# Patient Record
Sex: Male | Born: 1970 | Race: White | Hispanic: No | Marital: Single | Smoking: Never smoker
Health system: Southern US, Community
[De-identification: ages and names within clinical notes are randomized; demographics above are authoritative.]

## PROBLEM LIST (undated history)

## (undated) DIAGNOSIS — K5790 Diverticulosis of intestine, part unspecified, without perforation or abscess without bleeding: Secondary | ICD-10-CM

## (undated) DIAGNOSIS — I10 Essential (primary) hypertension: Secondary | ICD-10-CM

## (undated) DIAGNOSIS — C801 Malignant (primary) neoplasm, unspecified: Secondary | ICD-10-CM

## (undated) DIAGNOSIS — Z5111 Encounter for antineoplastic chemotherapy: Secondary | ICD-10-CM

## (undated) HISTORY — DX: Essential (primary) hypertension: I10

## (undated) HISTORY — DX: Encounter for antineoplastic chemotherapy: Z51.11

---

## 1999-04-08 ENCOUNTER — Emergency Department (HOSPITAL_COMMUNITY): Admission: EM | Admit: 1999-04-08 | Discharge: 1999-04-08 | Payer: Self-pay | Admitting: Internal Medicine

## 1999-04-08 ENCOUNTER — Encounter: Payer: Self-pay | Admitting: Internal Medicine

## 2005-01-16 ENCOUNTER — Emergency Department (HOSPITAL_COMMUNITY): Admission: EM | Admit: 2005-01-16 | Discharge: 2005-01-17 | Payer: Self-pay | Admitting: Emergency Medicine

## 2014-09-05 ENCOUNTER — Encounter (HOSPITAL_COMMUNITY): Payer: Self-pay | Admitting: Emergency Medicine

## 2014-09-05 ENCOUNTER — Inpatient Hospital Stay (HOSPITAL_COMMUNITY)
Admission: EM | Admit: 2014-09-05 | Discharge: 2014-09-07 | DRG: 871 | Disposition: A | Discharge status: Home / Self Care | Payer: 59 | Attending: Internal Medicine | Admitting: Internal Medicine

## 2014-09-05 ENCOUNTER — Inpatient Hospital Stay (HOSPITAL_COMMUNITY): Payer: 59

## 2014-09-05 DIAGNOSIS — Z791 Long term (current) use of non-steroidal anti-inflammatories (NSAID): Secondary | ICD-10-CM | POA: Diagnosis not present

## 2014-09-05 DIAGNOSIS — F1020 Alcohol dependence, uncomplicated: Secondary | ICD-10-CM | POA: Diagnosis present

## 2014-09-05 DIAGNOSIS — D589 Hereditary hemolytic anemia, unspecified: Secondary | ICD-10-CM | POA: Diagnosis present

## 2014-09-05 DIAGNOSIS — R112 Nausea with vomiting, unspecified: Secondary | ICD-10-CM | POA: Diagnosis present

## 2014-09-05 DIAGNOSIS — R197 Diarrhea, unspecified: Secondary | ICD-10-CM | POA: Diagnosis present

## 2014-09-05 DIAGNOSIS — Z79899 Other long term (current) drug therapy: Secondary | ICD-10-CM

## 2014-09-05 DIAGNOSIS — D6959 Other secondary thrombocytopenia: Secondary | ICD-10-CM | POA: Diagnosis present

## 2014-09-05 DIAGNOSIS — R319 Hematuria, unspecified: Secondary | ICD-10-CM | POA: Diagnosis present

## 2014-09-05 DIAGNOSIS — J101 Influenza due to other identified influenza virus with other respiratory manifestations: Secondary | ICD-10-CM | POA: Diagnosis present

## 2014-09-05 DIAGNOSIS — Z7982 Long term (current) use of aspirin: Secondary | ICD-10-CM | POA: Diagnosis not present

## 2014-09-05 DIAGNOSIS — F419 Anxiety disorder, unspecified: Secondary | ICD-10-CM | POA: Diagnosis present

## 2014-09-05 DIAGNOSIS — R17 Unspecified jaundice: Secondary | ICD-10-CM | POA: Diagnosis present

## 2014-09-05 DIAGNOSIS — X58XXXA Exposure to other specified factors, initial encounter: Secondary | ICD-10-CM | POA: Diagnosis present

## 2014-09-05 DIAGNOSIS — S36119A Unspecified injury of liver, initial encounter: Secondary | ICD-10-CM | POA: Diagnosis present

## 2014-09-05 DIAGNOSIS — D594 Other nonautoimmune hemolytic anemias: Secondary | ICD-10-CM | POA: Diagnosis present

## 2014-09-05 DIAGNOSIS — N390 Urinary tract infection, site not specified: Secondary | ICD-10-CM | POA: Diagnosis present

## 2014-09-05 DIAGNOSIS — D593 Hemolytic-uremic syndrome, unspecified: Secondary | ICD-10-CM

## 2014-09-05 DIAGNOSIS — I493 Ventricular premature depolarization: Secondary | ICD-10-CM | POA: Diagnosis present

## 2014-09-05 DIAGNOSIS — R740 Nonspecific elevation of levels of transaminase and lactic acid dehydrogenase [LDH]: Secondary | ICD-10-CM

## 2014-09-05 DIAGNOSIS — R7401 Elevation of levels of liver transaminase levels: Secondary | ICD-10-CM | POA: Diagnosis present

## 2014-09-05 DIAGNOSIS — A4189 Other specified sepsis: Principal | ICD-10-CM | POA: Diagnosis present

## 2014-09-05 DIAGNOSIS — N179 Acute kidney failure, unspecified: Secondary | ICD-10-CM | POA: Diagnosis present

## 2014-09-05 DIAGNOSIS — R945 Abnormal results of liver function studies: Secondary | ICD-10-CM

## 2014-09-05 DIAGNOSIS — D649 Anemia, unspecified: Secondary | ICD-10-CM | POA: Insufficient documentation

## 2014-09-05 HISTORY — DX: Diverticulosis of intestine, part unspecified, without perforation or abscess without bleeding: K57.90

## 2014-09-05 LAB — I-STAT CHEM 8, ED
BUN: 28 mg/dL — ABNORMAL HIGH (ref 6–23)
CALCIUM ION: 1.1 mmol/L — AB (ref 1.12–1.23)
CHLORIDE: 102 mmol/L (ref 96–112)
Creatinine, Ser: 1.8 mg/dL — ABNORMAL HIGH (ref 0.50–1.35)
GLUCOSE: 128 mg/dL — AB (ref 70–99)
HCT: 13 % — ABNORMAL LOW (ref 39.0–52.0)
Hemoglobin: 4.4 g/dL — CL (ref 13.0–17.0)
Potassium: 4.2 mmol/L (ref 3.5–5.1)
SODIUM: 135 mmol/L (ref 135–145)
TCO2: 19 mmol/L (ref 0–100)

## 2014-09-05 LAB — URINE MICROSCOPIC-ADD ON

## 2014-09-05 LAB — IRON AND TIBC
IRON: 129 ug/dL (ref 42–165)
SATURATION RATIOS: 54 % (ref 20–55)
TIBC: 239 ug/dL (ref 215–435)
UIBC: 110 ug/dL — ABNORMAL LOW (ref 125–400)

## 2014-09-05 LAB — PREPARE RBC (CROSSMATCH)

## 2014-09-05 LAB — CBC WITH DIFFERENTIAL/PLATELET
Basophils Absolute: 0 10*3/uL (ref 0.0–0.1)
Basophils Relative: 0 % (ref 0–1)
EOS PCT: 0 % (ref 0–5)
Eosinophils Absolute: 0 10*3/uL (ref 0.0–0.7)
HEMATOCRIT: 13.2 % — AB (ref 39.0–52.0)
Hemoglobin: 3.9 g/dL — CL (ref 13.0–17.0)
LYMPHS PCT: 16 % (ref 12–46)
Lymphs Abs: 0.8 10*3/uL (ref 0.7–4.0)
MCH: 26.4 pg (ref 26.0–34.0)
MCHC: 29.5 g/dL — ABNORMAL LOW (ref 30.0–36.0)
MCV: 89.2 fL (ref 78.0–100.0)
MONO ABS: 0.4 10*3/uL (ref 0.1–1.0)
Monocytes Relative: 8 % (ref 3–12)
Neutro Abs: 3.9 10*3/uL (ref 1.7–7.7)
Neutrophils Relative %: 76 % (ref 43–77)
Platelets: 119 10*3/uL — ABNORMAL LOW (ref 150–400)
RBC: 1.48 MIL/uL — ABNORMAL LOW (ref 4.22–5.81)
RDW: 18.4 % — ABNORMAL HIGH (ref 11.5–15.5)
WBC: 5.1 10*3/uL (ref 4.0–10.5)

## 2014-09-05 LAB — URINALYSIS, ROUTINE W REFLEX MICROSCOPIC
GLUCOSE, UA: 100 mg/dL — AB
Ketones, ur: >80 mg/dL — AB
Nitrite: POSITIVE — AB
PH: 5 (ref 5.0–8.0)
Protein, ur: >300 mg/dL — AB
Specific Gravity, Urine: 1.022 (ref 1.005–1.030)
Urobilinogen, UA: 2 mg/dL — ABNORMAL HIGH (ref 0.0–1.0)

## 2014-09-05 LAB — COMPREHENSIVE METABOLIC PANEL
ALK PHOS: 34 U/L — AB (ref 39–117)
ALT: 45 U/L (ref 0–53)
ANION GAP: 9 (ref 5–15)
AST: 600 U/L — ABNORMAL HIGH (ref 0–37)
Albumin: 4.2 g/dL (ref 3.5–5.2)
BUN: 23 mg/dL (ref 6–23)
CO2: 21 mmol/L (ref 19–32)
Calcium: 8.7 mg/dL (ref 8.4–10.5)
Chloride: 104 mmol/L (ref 96–112)
Creatinine, Ser: 1.44 mg/dL — ABNORMAL HIGH (ref 0.50–1.35)
GFR calc Af Amer: 66 mL/min — ABNORMAL LOW (ref >90)
GFR, EST NON AFRICAN AMERICAN: 57 mL/min — AB (ref >90)
Glucose, Bld: 131 mg/dL — ABNORMAL HIGH (ref 70–99)
POTASSIUM: 4 mmol/L (ref 3.5–5.1)
Sodium: 134 mmol/L — ABNORMAL LOW (ref 135–145)
TOTAL PROTEIN: 7.3 g/dL (ref 6.0–8.3)
Total Bilirubin: 4.3 mg/dL — ABNORMAL HIGH (ref 0.3–1.2)

## 2014-09-05 LAB — MRSA PCR SCREENING: MRSA by PCR: NEGATIVE

## 2014-09-05 LAB — BILIRUBIN, FRACTIONATED(TOT/DIR/INDIR)
Bilirubin, Direct: 0.7 mg/dL — ABNORMAL HIGH (ref 0.0–0.5)
Indirect Bilirubin: 2.9 mg/dL — ABNORMAL HIGH (ref 0.3–0.9)
Total Bilirubin: 3.6 mg/dL — ABNORMAL HIGH (ref 0.3–1.2)

## 2014-09-05 LAB — RETICULOCYTES
RBC.: 1.21 MIL/uL — AB (ref 4.22–5.81)
RETIC COUNT ABSOLUTE: 87.1 10*3/uL (ref 19.0–186.0)
Retic Ct Pct: 7.2 % — ABNORMAL HIGH (ref 0.4–3.1)

## 2014-09-05 LAB — POC OCCULT BLOOD, ED: Fecal Occult Bld: NEGATIVE

## 2014-09-05 LAB — SALICYLATE LEVEL: SALICYLATE LVL: 10.1 mg/dL (ref 2.8–20.0)

## 2014-09-05 LAB — ABO/RH: ABO/RH(D): B POS

## 2014-09-05 LAB — ACETAMINOPHEN LEVEL: Acetaminophen (Tylenol), Serum: <10 ug/mL — ABNORMAL LOW (ref 10–30)

## 2014-09-05 LAB — LACTATE DEHYDROGENASE: LDH: 3088 U/L — ABNORMAL HIGH (ref 94–250)

## 2014-09-05 LAB — LIPASE, BLOOD: LIPASE: 58 U/L (ref 11–59)

## 2014-09-05 LAB — VITAMIN B12: Vitamin B-12: 370 pg/mL (ref 211–911)

## 2014-09-05 LAB — FOLATE: Folate: >20 ng/mL

## 2014-09-05 LAB — FERRITIN: Ferritin: 153 ng/mL (ref 22–322)

## 2014-09-05 MED ORDER — ADULT MULTIVITAMIN W/MINERALS CH
1.0000 | ORAL_TABLET | Freq: Every day | ORAL | Status: DC
  Administered 2014-09-05 – 2014-09-07 (×3): 1 via ORAL
  Filled 2014-09-05 (×3): qty 1

## 2014-09-05 MED ORDER — FOLIC ACID 1 MG PO TABS
1.0000 mg | ORAL_TABLET | Freq: Every day | ORAL | Status: DC
  Administered 2014-09-05 – 2014-09-07 (×3): 1 mg via ORAL
  Filled 2014-09-05 (×3): qty 1

## 2014-09-05 MED ORDER — SODIUM CHLORIDE 0.9 % IV SOLN
Freq: Once | INTRAVENOUS | Status: AC
  Administered 2014-09-05: 17:00:00 via INTRAVENOUS

## 2014-09-05 MED ORDER — VITAMIN B-1 100 MG PO TABS
100.0000 mg | ORAL_TABLET | Freq: Every day | ORAL | Status: DC
  Administered 2014-09-05 – 2014-09-07 (×3): 100 mg via ORAL
  Filled 2014-09-05 (×3): qty 1

## 2014-09-05 MED ORDER — DEXTROSE 5 % IV SOLN
1.0000 g | INTRAVENOUS | Status: DC
  Administered 2014-09-05: 1 g via INTRAVENOUS
  Filled 2014-09-05: qty 10

## 2014-09-05 MED ORDER — LORAZEPAM 1 MG PO TABS
1.0000 mg | ORAL_TABLET | Freq: Four times a day (QID) | ORAL | Status: DC | PRN
  Administered 2014-09-05 – 2014-09-07 (×4): 1 mg via ORAL
  Filled 2014-09-05 (×4): qty 1

## 2014-09-05 MED ORDER — LORAZEPAM 2 MG/ML IJ SOLN
1.0000 mg | Freq: Once | INTRAMUSCULAR | Status: AC
  Administered 2014-09-05: 1 mg via INTRAVENOUS
  Filled 2014-09-05: qty 1

## 2014-09-05 MED ORDER — ALUM & MAG HYDROXIDE-SIMETH 200-200-20 MG/5ML PO SUSP: 30.0000 mL | Freq: Four times a day (QID) | ORAL | Status: DC | PRN

## 2014-09-05 MED ORDER — THIAMINE HCL 100 MG/ML IJ SOLN: 100.0000 mg | Freq: Every day | INTRAMUSCULAR | Status: DC

## 2014-09-05 MED ORDER — ONDANSETRON HCL 4 MG/2ML IJ SOLN: 4.0000 mg | Freq: Four times a day (QID) | INTRAMUSCULAR | Status: DC | PRN

## 2014-09-05 MED ORDER — SODIUM CHLORIDE 0.9 % IJ SOLN
3.0000 mL | Freq: Two times a day (BID) | INTRAMUSCULAR | Status: DC
  Administered 2014-09-05 – 2014-09-06 (×2): 3 mL via INTRAVENOUS

## 2014-09-05 MED ORDER — IOHEXOL 300 MG/ML  SOLN
50.0000 mL | Freq: Once | INTRAMUSCULAR | Status: AC | PRN
  Administered 2014-09-05: 50 mL via ORAL

## 2014-09-05 MED ORDER — ONDANSETRON HCL 4 MG PO TABS: 4.0000 mg | ORAL_TABLET | Freq: Four times a day (QID) | ORAL | Status: DC | PRN

## 2014-09-05 MED ORDER — SODIUM CHLORIDE 0.9 % IV SOLN
INTRAVENOUS | Status: DC
Start: 2014-09-05 — End: 2014-09-07
  Administered 2014-09-05 – 2014-09-06 (×3): via INTRAVENOUS
  Administered 2014-09-06: 125 mL via INTRAVENOUS
  Administered 2014-09-07: 03:00:00 via INTRAVENOUS

## 2014-09-05 MED ORDER — ONDANSETRON HCL 4 MG/2ML IJ SOLN
4.0000 mg | Freq: Once | INTRAMUSCULAR | Status: AC
  Administered 2014-09-05: 4 mg via INTRAVENOUS
  Filled 2014-09-05: qty 2

## 2014-09-05 MED ORDER — PREDNISONE 20 MG PO TABS
80.0000 mg | ORAL_TABLET | Freq: Every day | ORAL | Status: DC
  Administered 2014-09-06 – 2014-09-07 (×2): 80 mg via ORAL
  Filled 2014-09-05 (×2): qty 4

## 2014-09-05 MED ORDER — HYDROMORPHONE HCL 1 MG/ML IJ SOLN: 1.0000 mg | INTRAMUSCULAR | Status: DC | PRN

## 2014-09-05 MED ORDER — SODIUM CHLORIDE 0.9 % IV BOLUS (SEPSIS)
1000.0000 mL | Freq: Once | INTRAVENOUS | Status: AC
  Administered 2014-09-05: 1000 mL via INTRAVENOUS

## 2014-09-05 MED ORDER — LORAZEPAM 2 MG/ML IJ SOLN: 1.0000 mg | Freq: Four times a day (QID) | INTRAMUSCULAR | Status: DC | PRN

## 2014-09-05 MED ORDER — DEXTROSE 5 % IV SOLN
1.0000 g | INTRAVENOUS | Status: DC
  Administered 2014-09-06: 1 g via INTRAVENOUS
  Filled 2014-09-05: qty 10

## 2014-09-05 NOTE — Progress Notes (Signed)
[place] [phone]   [phone]  CONSULT NOTE  REFERRING PHYSICIAN: Dr. Nira Conn RAI  REASON FOR CONSULTATION:  44 years old white male with severe anemia  HPI RAHIM ASTORGA is a 44 y.o. male with no significant past medical history except for diverticulosis as well as history of alcohol abuse. The patient has been in his general health until 2 days ago when he had flulike symptoms. He left work and start taking BC powder. It did help some and then he start taking Sudafed. He slept well yesterday. He was visited by his girlfriend earlier today and she noticed that his skin is pale and he has jaundice in his eyes. The patient also started having dark reddish urine yesterday. He presented to the emergency department today for evaluation. CBC on evaluation showed hemoglobin of 3.9 and hematocrit 13.2%. He has normal white blood count of 5.1 and low platelets count of 119,000. Comprehensive metabolic panel showed serum creatinine of 1.44, AST 600, ALT 45, total bilirubin 4.3. Urinalysis was positive for nitrate, bilirubin, ketones as well as leukocyte and did blood cells. His reticulocyte count was normal at 87.1. Acetaminophen and salicylate levels were normal. Fractionated bilirubin showed elevated indirect bilirubin to 2.9 and LDH is significantly elevated 3088. Review of the peripheral blood smear showed normocytic anemia with teardrops and few target cells but no schistocytes. The patient is currently receiving PRBCs transfusion. He is feeling fine with no specific complaints except for fatigue and jaundice. He had mild flank pain started 2 days ago. He denied having any current nausea or vomiting. He has few episodes of diarrhea 2 days ago but not today. He had fever with mild chills yesterday. The patient denied having any significant chest pain, shortness of breath, cough or hemoptysis. He has no headache or change in his vision. Stool for Hemoccult  was negative. He also denied having any significant medical history. No history of diabetes, hypertension, coronary artery disease, stroke or dyslipidemia. Family history is unremarkable for any malignancy or blood diseases. The patient is single and has no children. He is not smoker but drinks alcohol around 4 beers every day. No history of drug abuse. 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 HPI  Past Medical History  Diagnosis Date  . Diverticulosis     History reviewed. No pertinent past surgical history.  Family History  Problem Relation Age of Onset  . Alcohol abuse Brother   . Cirrhosis Brother     deceased    Social History History  Substance Use Topics  . Smoking status: Never Smoker   . Smokeless tobacco: Not on file  . Alcohol Use: No    No Known Allergies  Current Facility-Administered Medications  Medication Dose Route Frequency Provider Last Rate Last Dose  . 0.9 %  sodium chloride infusion   Intravenous Continuous Ripudeep Krystal Eaton, MD 125 mL/hr at 09/05/14 1750    . cefTRIAXone (ROCEPHIN) 1 g in dextrose 5 % 50 mL IVPB  1 g Intravenous Q24H Ripudeep Krystal Eaton, MD   Stopped at 09/05/14 1800   Current Outpatient Prescriptions  Medication Sig Dispense Refill  . ACETAMINOPHEN PO Take by mouth.    . Aspirin-Salicylamide-Caffeine (BC HEADACHE POWDER PO) Take 1 packet by mouth daily as needed (pain.).    [name] Kitchen celecoxib (CELEBREX) 100 MG capsule Take 100-200 mg by mouth daily as needed for mild pain.    . Multiple Vitamin (MULTIVITAMIN WITH MINERALS) TABS tablet Take  1 tablet by mouth daily.      Review of Systems  Constitutional: positive for chills, fatigue and fevers Eyes: negative Ears, nose, mouth, throat, and face: negative Respiratory: negative Cardiovascular: negative Gastrointestinal: positive for diarrhea and nausea Genitourinary:positive for hematuria Integument/breast: negative Hematologic/lymphatic:  negative Musculoskeletal:negative Neurological: negative Behavioral/Psych: negative Endocrine: negative Allergic/Immunologic: negative  Physical Exam  RAL: Well-developed with no status and pale appearance and icteric sclera. SKIN: no rashes or significant lesions HEAD: Normocephalic, No masses, lesions, tenderness or abnormalities EYES: normal, PERRLA, icteric sclera. EARS: External ears normal, Canals clear OROPHARYNX:no exudate, no erythema and lips, buccal mucosa, and tongue normal  NECK: supple, no adenopathy, no JVD LYMPH:  no palpable lymphadenopathy, no hepatosplenomegaly LUNGS: clear to auscultation , and palpation HEART: regular rate & rhythm, no murmurs and no gallops ABDOMEN:abdomen soft, non-tender, normal bowel sounds and no masses or organomegaly BACK: Back symmetric, no curvature., No CVA tenderness EXTREMITIES:no joint deformities, effusion, or inflammation, no edema, no skin discoloration  NEURO: alert & oriented x 3 with fluent speech, no focal motor/sensory deficits  PERFORMANCE STATUS: ECOG 1  LABORATORY DATA: Lab Results  Component Value Date   WBC 5.1 09/05/2014   HGB 4.4* 09/05/2014   HCT 13.0* 09/05/2014   MCV 89.2 09/05/2014   PLT 119* 09/05/2014    @LASTCHEM @  RADIOGRAPHIC STUDIES: No results found.  ASSESSMENT: This is a very pleasant 44 years old white male with severe anemia most likely secondary to hemolysis but other etiologies cannot be excluded at this point. The patient has no significant schistocytes on the peripheral blood smear.   PLAN: We ordered several studies today for evaluation of his anemia including CBC, comprehensive metabolic panel iron study, ferritin, haptoglobin, LDH, vitamin B 12, serum folate. He is also have a CT scan of the abdomen pending for evaluation of his abdominal pain as well as the hematuria and elevated liver enzymes. We will continue with the current supportive care with IV hydration and PRBCs  transfusion. I will wait for the remaining results before giving any further recommendation regarding management of his condition. The findings are consistent with hemolytic anemia, I would consider the patient for treatment with high-dose steroid, prednisone 1 mg/kilogram on daily basis. There is no clear finding to suggest TTP at this point but we will continue to monitor for any symptoms or signs to suggest this diagnosis. I discussed my recommendation with the patient and he is in agreement with the current plan. The patient voices understanding of current disease status and treatment options and is in agreement with the current care plan.  All questions were answered.  Thank you so much for allowing me to participate in the care of EVENS MENO. I will continue to follow up the patient with you and assist in his care.  Disclaimer: This note was dictated with voice recognition software. Similar sounding words can inadvertently be transcribed and may not be corrected upon review.   Yvan Dority K. September 05, 2014, 7:06 PM

## 2014-09-05 NOTE — ED Provider Notes (Signed)
CSN: [id number]     Arrival date & time 09/05/14  1414 History   First MD Initiated Contact with Patient 09/05/14 1506     Chief Complaint  Patient presents with  . Fever  . Diarrhea  . Emesis  . Jaundice     (Consider location/radiation/quality/duration/timing/severity/associated sxs/prior Treatment) HPI Travis Mcdaniel is a 44 year old male with past medical history of diverticulosis who presents the ER complaining of nausea, vomiting, diarrhea, fever and jaundice. Patient reports having nausea, vomiting, diarrhea 3 days, and has had difficulty keeping food or fluids down. He reports today his significant other came home and noticed that his skin and eyes were tinted yellow. Patient reports he has never had this happen to him before. Patient reports some mild associated dyspnea on exertion this morning. Patient also reports associated hematuria throughout the day today. Patient denies chest pain, dizziness, weakness, headache, blurred vision, abdominal pain, hematemesis, hematochezia, melena. Patient states he currently drinks approximately 2 beers a day with approximately 6 beers a day on the weekend. Patient states she has drank for years, and use to drink even heavier than this. Patient denies using any IV drugs. Patient states he takes multivitamins, does not take any other medication over-the-counter.  Past Medical History  Diagnosis Date  . Diverticulosis    History reviewed. No pertinent past surgical history. Family History  Problem Relation Age of Onset  . Alcohol abuse Brother   . Cirrhosis Brother     deceased   History  Substance Use Topics  . Smoking status: Never Smoker   . Smokeless tobacco: Not on file  . Alcohol Use: No    Review of Systems  Constitutional: Negative for fever.  HENT: Negative for trouble swallowing.   Eyes: Negative for visual disturbance.  Respiratory: Negative for shortness of breath.   Cardiovascular: Negative for chest pain.   Gastrointestinal: Positive for nausea, vomiting and diarrhea. Negative for abdominal pain.  Genitourinary: Positive for hematuria. Negative for dysuria.  Musculoskeletal: Negative for neck pain.  Skin: Positive for color change. Negative for rash.  Neurological: Negative for dizziness, weakness and numbness.  Psychiatric/Behavioral: Negative.       Allergies  Review of patient's allergies indicates no known allergies.  Home Medications   Prior to Admission medications   Medication Sig Start Date End Date Taking? Authorizing Provider  ACETAMINOPHEN PO Take by mouth.   Yes Historical Provider, MD  Aspirin-Salicylamide-Caffeine (BC HEADACHE POWDER PO) Take 1 packet by mouth daily as needed (pain.).   Yes Historical Provider, MD  celecoxib (CELEBREX) 100 MG capsule Take 100-200 mg by mouth daily as needed for mild pain.   Yes Historical Provider, MD  Multiple Vitamin (MULTIVITAMIN WITH MINERALS) TABS tablet Take 1 tablet by mouth daily.   Yes Historical Provider, MD   BP 151/65 mmHg  Pulse 72  Temp(Src) 98.9 F (37.2 C) (Oral)  Resp 16  Ht 5\' 6"  (1.676 m)  Wt 148 lb 2.4 oz (67.2 kg)  BMI 23.92 kg/m2  SpO2 99% Physical Exam  Constitutional: He is oriented to person, place, and time. He appears well-developed and well-nourished. He has a sickly appearance.  Jaundiced-appearing male speaking in full, clear sentences and in mild distress from anxiety.  HENT:  Head: Normocephalic and atraumatic.  Mouth/Throat: Oropharynx is clear and moist. No oropharyngeal exudate.  Eyes: Conjunctivae and EOM are normal. Pupils are equal, round, and reactive to light. Right eye exhibits no discharge. Left eye exhibits no discharge. Scleral icterus is present.  Neck: Normal  range of motion.  Cardiovascular: Regular rhythm and S2 normal.  Tachycardia present.   Murmur heard.  Systolic murmur is present with a grade of 2/6  Pulses:      Radial pulses are 2+ on the right side, and 2+ on the left  side.  Tachycardia at 106 on exam  Pulmonary/Chest: Effort normal and breath sounds normal. No accessory muscle usage. No tachypnea. No respiratory distress.  Abdominal: Soft. Normal appearance. He exhibits no shifting dullness, no distension, no pulsatile liver, no fluid wave, no ascites, no pulsatile midline mass and no mass. There is no tenderness. There is no rigidity, no guarding, no tenderness at McBurney's point and negative Murphy's sign.  Genitourinary: Rectum normal. Rectal exam shows no external hemorrhoid, no internal hemorrhoid, no fissure, no mass and no tenderness.  Musculoskeletal: Normal range of motion. He exhibits no edema or tenderness.  Neurological: He is alert and oriented to person, place, and time. He has normal strength. No cranial nerve deficit or sensory deficit. Coordination normal. GCS eye subscore is 4. GCS verbal subscore is 5. GCS motor subscore is 6.  Patient fully alert, answering questions appropriately in full, clear sentences. Cranial nerves II through XII grossly intact. Motor strength 5 out of 5 in all major muscle groups of upper and lower extremity. Distal sensation intact.  Skin: Skin is warm and dry. No rash noted. He is not diaphoretic.  Psychiatric: He has a normal mood and affect.  Nursing note and vitals reviewed.   ED Course  Procedures (including critical care time) Labs Review Labs Reviewed  CBC WITH DIFFERENTIAL/PLATELET - Abnormal; Notable for the following:    RBC 1.48 (*)    Hemoglobin 3.9 (*)    HCT 13.2 (*)    MCHC 29.5 (*)    RDW 18.4 (*)    Platelets 119 (*)    All other components within normal limits  COMPREHENSIVE METABOLIC PANEL - Abnormal; Notable for the following:    Sodium 134 (*)    Glucose, Bld 131 (*)    Creatinine, Ser 1.44 (*)    AST 600 (*)    Alkaline Phosphatase 34 (*)    Total Bilirubin 4.3 (*)    GFR calc non Af Amer 57 (*)    GFR calc Af Amer 66 (*)    All other components within normal limits   URINALYSIS, ROUTINE W REFLEX MICROSCOPIC - Abnormal; Notable for the following:    Color, Urine RED (*)    APPearance TURBID (*)    Glucose, UA 100 (*)    Hgb urine dipstick LARGE (*)    Bilirubin Urine LARGE (*)    Ketones, ur >80 (*)    Protein, ur >300 (*)    Urobilinogen, UA 2.0 (*)    Nitrite POSITIVE (*)    Leukocytes, UA LARGE (*)    All other components within normal limits  IRON AND TIBC - Abnormal; Notable for the following:    UIBC 110 (*)    All other components within normal limits  RETICULOCYTES - Abnormal; Notable for the following:    Retic Ct Pct 7.2 (*)    RBC. 1.21 (*)    All other components within normal limits  ACETAMINOPHEN LEVEL - Abnormal; Notable for the following:    Acetaminophen (Tylenol), Serum <10.0 (*)    All other components within normal limits  URINE MICROSCOPIC-ADD ON - Abnormal; Notable for the following:    Squamous Epithelial / LPF MANY (*)    Bacteria, UA  MANY (*)    Casts GRANULAR CAST (*)    All other components within normal limits  LACTATE DEHYDROGENASE - Abnormal; Notable for the following:    LDH 3088 (*)    All other components within normal limits  BILIRUBIN, FRACTIONATED(TOT/DIR/INDIR) - Abnormal; Notable for the following:    Total Bilirubin 3.6 (*)    Bilirubin, Direct 0.7 (*)    Indirect Bilirubin 2.9 (*)    All other components within normal limits  I-STAT CHEM 8, ED - Abnormal; Notable for the following:    BUN 28 (*)    Creatinine, Ser 1.80 (*)    Glucose, Bld 128 (*)    Calcium, Ion 1.10 (*)    Hemoglobin 4.4 (*)    HCT 13.0 (*)    All other components within normal limits  MRSA PCR SCREENING  URINE CULTURE  OVA AND PARASITE EXAMINATION  CLOSTRIDIUM DIFFICILE BY PCR  URINE CULTURE  LIPASE, BLOOD  SALICYLATE LEVEL  VITAMIN B12  FOLATE  FERRITIN  HAPTOGLOBIN  OCCULT BLOOD X 1 CARD TO LAB, STOOL  INFLUENZA PANEL BY PCR (TYPE A & B, H1N1)  GI PATHOGEN PANEL BY PCR, STOOL  FECAL LACTOFERRIN  CBC   COMPREHENSIVE METABOLIC PANEL  TSH  HEPATITIS PANEL, ACUTE  HEMOGLOBIN AND HEMATOCRIT, BLOOD  HEMOGLOBIN AND HEMATOCRIT, BLOOD  POC OCCULT BLOOD, ED  TYPE AND SCREEN  PREPARE RBC (CROSSMATCH)  ABO/RH    Imaging Review Ct Abdomen Pelvis Wo Contrast  09/05/2014   CLINICAL DATA:  I fatigue, nausea, vomiting, and diarrhea for 2 days. Of gross hematuria onset today. No IV contrast material due to acute renal injury.  EXAM: CT ABDOMEN AND PELVIS WITHOUT CONTRAST  TECHNIQUE: Multidetector CT imaging of the abdomen and pelvis was performed following the standard protocol without IV contrast.  COMPARISON:  01/16/2005  FINDINGS: Lung bases are clear, allowing for motion artifact.  Evaluation of solid organs and vascular structures is limited without IV contrast material. Scattered subcentimeter low-attenuation lesions in the liver are too small to characterize but likely represent small cysts. The unenhanced appearance of the gallbladder, spleen, pancreas, adrenal glands, kidneys, inferior vena cava, and retroperitoneal lymph nodes is unremarkable. Calcification of aorta without aneurysm. Mesenteric and celiac axis lymph nodes are present without pathologic enlargement, likely reactive. Stomach, small bowel, and colon are not abnormally distended. Contrast material flows through to the colon without evidence of bowel obstruction. No bowel wall thickening is appreciated. No free air or free fluid in the abdomen. Abdominal wall musculature appears intact.  Pelvis: Diverticulosis of the sigmoid colon without evidence of diverticulitis. Appendix is normal. Bladder wall is not thickened. Prostate gland is not enlarged. No free or loculated pelvic fluid collections. No pelvic mass or lymphadenopathy. Degenerative changes in the spine. No destructive bone lesions.  IMPRESSION: No acute process demonstrated in the abdomen or pelvis on noncontrast imaging. No evidence of bowel obstruction or significant bowel wall  thickening.   Electronically Signed   By: Lucienne Capers M.D.   On: 09/05/2014 19:27     EKG Interpretation   Date/Time:  Wednesday September 05 2014 15:16:21 EST Ventricular Rate:  97 PR Interval:  142 QRS Duration: 103 QT Interval:  349 QTC Calculation: 443 R Axis:   75 Text Interpretation:  Sinus rhythm No old tracing to compare Confirmed by  CAMPOS  MD, Lennette Bihari ([id number]) on 09/05/2014 4:14:32 PM      MDM   Final diagnoses:  Transaminitis  Hemolytic-uremic syndrome    Patient jaundiced,  with nausea vomiting and diarrhea. Rectal exam without frank blood. Patient does note to have hematuria subjectively, hemoglobin 3.9. We will verify this, and begin blood transfusion. Patient only complaining of mild nausea and anxiety. We'll treat symptomatically. Abdominal exam benign.  CRITICAL CARE Performed by: Carrie Mew   Total critical care time: 31  Critical care time was exclusive of separately billable procedures and treating other patients.  Critical care was necessary to treat or prevent imminent or life-threatening deterioration.  Critical care was time spent personally by me on the following activities: development of treatment plan with patient and/or surrogate as well as nursing, discussions with consultants, evaluation of patient's response to treatment, examination of patient, obtaining history from patient or surrogate, ordering and performing treatments and interventions, ordering and review of laboratory studies, ordering and review of radiographic studies, pulse oximetry and re-evaluation of patient's condition.  Likely patient having hemolytic process attributing his anemia, will consult and admitted to medicine. Dr. Tana Coast with [facility] hospitalist to admit. The patient appears reasonably stabilized for admission considering the current resources, flow, and capabilities available in the ED at this time, and I doubt any other [facility] requiring further screening and/or treatment in  the ED prior to admission.  Signed,  Dahlia Bailiff, [name] 1:23 AM  Patient seen and discussed with Dr. Jola Schmidt, MD    Dahlia Bailiff, [name] 09/06/14 Raoul, MD 09/06/14 [phone]

## 2014-09-05 NOTE — H&P (Signed)
History and Physical       Hospital Admission Note Date: 09/05/2014  Patient name: Travis Mcdaniel Medical record number: [id number] Date of birth: 05-16-1971 Age: 44 y.o. Gender: male  PCP: Patient does not have any PCP    Chief Complaint:  Fever, vomiting, jaundice, hematuria, diarrhea  HPI: Patient is a 44 year old male with history of alcohol use, diverticulosis, does not have PCP, has not seen any physician in several years presented to ED, very fatigued, having nausea, vomiting, diarrhea for last 2 days. History was obtained from the patient who reported that on Monday, 2 days ago, he felt very fatigued, myalgias, chills, congestion. He took some BC powder for the headache and theraflu packet. Next day he started having diarrhea, no hematochezia or melena. He continued to feel poorly, today started having hematuria throughout the day. He denied any chest pain, coughing and productive phlegm, hematemesis, hematochezia or melena. Patient also reports that he drinks daily up to 4-6 beers a day for last 20 years, sometimes up to 8 beers a day. He otherwise denied using any new medications or IV drugs. He reports having sick contact with people around him having flulike symptoms, none actually diagnosed with influenza. Patient was told that he looked jaundiced today and hence he decided to seek medical attention. No tick bite or rashes or travel history or any exotic food intake. ER workup showed creatinine of 1.4 initially, trended up to 1.8 AST elevated 600, ALT 45, bilirubin 4.3, severe anemia with hemoglobin of 3.9, hematocrit 13.2 the PVCs 5.1, platelets 119, MCV 89.2. UA positive for nitrite, leukocytes, WBCs 21-50, ketones positive. Fecal occult blood test negative   Review of Systems:  Constitutional: +fever, chills, diaphoresis, poor appetite and fatigue.  HEENT: Denies photophobia, eye pain, redness, hearing loss, ear pain,  sore  throat, rhinorrhea, sneezing, mouth sores, trouble swallowing, neck pain, neck stiffness and tinnitus.  + congestion Respiratory: Denies SOB, DOE, cough, chest tightness,  and wheezing.   Cardiovascular: Denies chest pain, palpitations and leg swelling.  Gastrointestinal: + nausea, vomiting, diarrhea, no abdominal pain, constipation, blood in stool and abdominal distention.  Genitourinary: Denies dysuria, urgency, frequency, + hematuria, flank pain and difficulty urinating.  Musculoskeletal: +myalgias, back pain, joint swelling, arthralgias and gait problem.  Skin: Denies pallor, rash and wound.  Neurological: + dizziness, lightheadedness, generalized weakness, no seizures, syncope, numbness and + headaches.  Hematological: Denies adenopathy. Easy bruising, personal or family bleeding history  Psychiatric/Behavioral: Denies suicidal ideation, mood changes, confusion, nervousness, sleep disturbance and agitation  Past Medical History: Past Medical History  Diagnosis Date  . Diverticulosis    History reviewed. No pertinent past surgical history.  Medications: Prior to Admission medications   Medication Sig Start Date End Date Taking? Authorizing Provider  ACETAMINOPHEN PO Take by mouth.   Yes Historical Provider, MD  Aspirin-Salicylamide-Caffeine (BC HEADACHE POWDER PO) Take 1 packet by mouth daily as needed (pain.).   Yes Historical Provider, MD  celecoxib (CELEBREX) 100 MG capsule Take 100-200 mg by mouth daily as needed for mild pain.   Yes Historical Provider, MD  Multiple Vitamin (MULTIVITAMIN WITH MINERALS) TABS tablet Take 1 tablet by mouth daily.   Yes Historical Provider, MD    Allergies:  No Known Allergies  Social History: Patient reports that he has never smoked. He does not have any smokeless tobacco history on file. He denies using any IV or recreational drugs. Patient reports he drinks alcohol daily sometimes up to 8 beers per day for last  20 years.  Family  History: Family History  Problem Relation Age of Onset  . Alcohol abuse Brother   . Cirrhosis Brother     deceased    Physical Exam: Blood pressure 130/65, pulse 97, temperature 99.2 F (37.3 C), temperature source Oral, resp. rate 15, SpO2 97 %. General: Alert, awake, oriented x3, in no acute distress, jaundiced. HEENT: normocephalic, atraumatic, icteric sclera, pink conjunctiva, pupils equal and reactive to light and accomodation, oropharynx clear Neck: supple, no masses or lymphadenopathy, no goiter, no bruits  Heart: Regular rate and rhythm, without murmurs, rubs or gallops. Lungs: Clear to auscultation bilaterally, no wheezing, rales or rhonchi. Abdomen: Soft, nontender, nondistended, positive bowel sounds, no masses. Extremities: No clubbing, cyanosis or edema with positive pedal pulses. Neuro: Grossly intact, no focal neurological deficits, strength 5/5 upper and lower extremities bilaterally Psych: alert and oriented x 3, normal mood and affect Skin: no rashes or lesions, warm and dry   LABS on Admission:  Basic Metabolic Panel:  Recent Labs Lab 09/05/14 1438 09/05/14 1540  NA 134* 135  K 4.0 4.2  CL 104 102  CO2 21  --   GLUCOSE 131* 128*  BUN 23 28*  CREATININE 1.44* 1.80*  CALCIUM 8.7  --    Liver Function Tests:  Recent Labs Lab 09/05/14 1438  AST 600*  ALT 45  ALKPHOS 34*  BILITOT 4.3*  PROT 7.3  ALBUMIN 4.2    Recent Labs Lab 09/05/14 1438  LIPASE 58   No results for input(s): AMMONIA in the last 168 hours. CBC:  Recent Labs Lab 09/05/14 1438 09/05/14 1540  WBC 5.1  --   NEUTROABS 3.9  --   HGB 3.9* 4.4*  HCT 13.2* 13.0*  MCV 89.2  --   PLT 119*  --    Cardiac Enzymes: No results for input(s): CKTOTAL, CKMB, CKMBINDEX, TROPONINI in the last 168 hours. BNP: Invalid input(s): POCBNP CBG: No results for input(s): GLUCAP in the last 168 hours.   Radiological Exams on Admission: No results found.  Assessment/Plan Principal  Problem:   Anemia: Likely acute, baseline unknown, patient has not seen any physician in the last several years, associated with transaminitis, hematuria, diarrhea, acute kidney injury, rule out hemolytic anemia or HUS - Admit to stepdown, Continue transfusion, has an order for 4 units packed RBCs transfusion, continue IV fluids  - Hemolytic workup started, MCV normal, follow anemia panel, LDH, haptoglobin, check smear, FOBT  - Discussed with hematology consult, Dr. Julien Nordmann will see patient tonight   - also check influenza panel, stool studies, stat CT abdomen and pelvis to assess for hepatosplenomegaly or any other abdominal pathology  Active Problems:   Acute kidney injury - Likely due to acute anemia, UTI - Continue aggressive IV fluid hydration, blood transfusion, treat UTI - Follow CT abdomen and pelvis results    UTI (lower urinary tract infection) - Obtain urine culture and sensitivities, placed on IV Rocephin    Transaminitis with hyperbilirubinemia: Unclear etiology, patient has history of alcohol abuse - Obtain acute hepatitis panel, hemolytic anemia panel, CT abdomen and pelvis    Hematuria - Continue blood transfusion, treat UTI, follow CT abdomen and pelvis results, may need urology consult pending CT results    Alcohol dependence - Placed on CIWA scale with Ativan, continue thiamine, folate, MVI     Diarrhea - Follow stool studies, ova and parasites, C. difficile, GI pathogen panel, fecal lactoferrin  DVT prophylaxis: SCDs  CODE STATUS: Full CODE STATUS  Family  Communication: Admission, patients condition and plan of care including tests being ordered have been discussed with the patient who indicates understanding and agree with the plan and Code Status   Further plan will depend as patient's clinical course evolves and further radiologic and laboratory data become available.   Time Spent on Admission: 1 hour  Codylee Patil M.D. [facility] 09/05/2014,  5:50 PM Pager: [phone]  If 7PM-7AM, please contact night-coverage [email or web address] Password [email or web address]

## 2014-09-05 NOTE — ED Notes (Addendum)
Pt states he thought he had the flu on Monday along with fever but did not take temp, pt took tylenol and aspirin. Pt states yesterday starting peeing blood, and people were making him aware that he was looking yellow as far as skin and eyes. Pt also c/o diarrhea and not being able to keep fluids down. Pt c/o generalized body aches. Pt does have yellowish tint to skin and eyes and states this is not normal.

## 2014-09-05 NOTE — ED Notes (Signed)
Notified campos of abnormal lab result

## 2014-09-06 DIAGNOSIS — F1020 Alcohol dependence, uncomplicated: Secondary | ICD-10-CM

## 2014-09-06 DIAGNOSIS — J101 Influenza due to other identified influenza virus with other respiratory manifestations: Secondary | ICD-10-CM | POA: Diagnosis present

## 2014-09-06 DIAGNOSIS — J09X9 Influenza due to identified novel influenza A virus with other manifestations: Secondary | ICD-10-CM

## 2014-09-06 DIAGNOSIS — D599 Acquired hemolytic anemia, unspecified: Secondary | ICD-10-CM

## 2014-09-06 DIAGNOSIS — D696 Thrombocytopenia, unspecified: Secondary | ICD-10-CM

## 2014-09-06 LAB — CBC
HEMATOCRIT: 23.7 % — AB (ref 39.0–52.0)
Hemoglobin: 7.8 g/dL — ABNORMAL LOW (ref 13.0–17.0)
MCH: 28.2 pg (ref 26.0–34.0)
MCHC: 32.9 g/dL (ref 30.0–36.0)
MCV: 85.6 fL (ref 78.0–100.0)
Platelets: 79 10*3/uL — ABNORMAL LOW (ref 150–400)
RBC: 2.77 MIL/uL — ABNORMAL LOW (ref 4.22–5.81)
RDW: 16 % — AB (ref 11.5–15.5)
WBC: 4 10*3/uL (ref 4.0–10.5)

## 2014-09-06 LAB — COMPREHENSIVE METABOLIC PANEL
ALT: 35 U/L (ref 0–53)
AST: 410 U/L — AB (ref 0–37)
Albumin: 3.7 g/dL (ref 3.5–5.2)
Alkaline Phosphatase: 32 U/L — ABNORMAL LOW (ref 39–117)
Anion gap: 6 (ref 5–15)
BUN: 17 mg/dL (ref 6–23)
CO2: 20 mmol/L (ref 19–32)
CREATININE: 1.14 mg/dL (ref 0.50–1.35)
Calcium: 8.3 mg/dL — ABNORMAL LOW (ref 8.4–10.5)
Chloride: 111 mmol/L (ref 96–112)
GFR calc Af Amer: 88 mL/min — ABNORMAL LOW (ref >90)
GFR, EST NON AFRICAN AMERICAN: 76 mL/min — AB (ref >90)
Glucose, Bld: 120 mg/dL — ABNORMAL HIGH (ref 70–99)
POTASSIUM: 3.7 mmol/L (ref 3.5–5.1)
SODIUM: 137 mmol/L (ref 135–145)
TOTAL PROTEIN: 6.7 g/dL (ref 6.0–8.3)
Total Bilirubin: 1.8 mg/dL — ABNORMAL HIGH (ref 0.3–1.2)

## 2014-09-06 LAB — DIRECT ANTIGLOBULIN TEST (NOT AT ARMC)
DAT, COMPLEMENT: NEGATIVE
DAT, IGG: NEGATIVE

## 2014-09-06 LAB — INFLUENZA PANEL BY PCR (TYPE A & B)
H1N1FLUPCR: DETECTED — AB
INFLAPCR: POSITIVE — AB
Influenza B By PCR: NEGATIVE

## 2014-09-06 LAB — DIC (DISSEMINATED INTRAVASCULAR COAGULATION) PANEL
D DIMER QUANT: 1.15 ug{FEU}/mL — AB (ref 0.00–0.48)
INR: 0.94 (ref 0.00–1.49)
PROTHROMBIN TIME: 12.6 s (ref 11.6–15.2)
Platelets: 98 10*3/uL — ABNORMAL LOW (ref 150–400)

## 2014-09-06 LAB — HEPATITIS PANEL, ACUTE
HCV AB: NEGATIVE
Hep A IgM: NONREACTIVE
Hep B C IgM: NONREACTIVE
Hepatitis B Surface Ag: NEGATIVE

## 2014-09-06 LAB — HAPTOGLOBIN: Haptoglobin: <10 mg/dL — ABNORMAL LOW (ref 34–200)

## 2014-09-06 LAB — DIC (DISSEMINATED INTRAVASCULAR COAGULATION)PANEL
Fibrinogen: 404 mg/dL (ref 204–475)
Smear Review: NONE SEEN
aPTT: 29 seconds (ref 24–37)

## 2014-09-06 LAB — TSH: TSH: 3.282 u[IU]/mL (ref 0.350–4.500)

## 2014-09-06 LAB — HEMOGLOBIN AND HEMATOCRIT, BLOOD
HCT: 24.7 % — ABNORMAL LOW (ref 39.0–52.0)
Hemoglobin: 8.1 g/dL — ABNORMAL LOW (ref 13.0–17.0)

## 2014-09-06 MED ORDER — HYDROCOD POLST-CHLORPHEN POLST 10-8 MG/5ML PO LQCR
5.0000 mL | Freq: Once | ORAL | Status: AC
Start: 2014-09-06 — End: 2014-09-06
  Administered 2014-09-06: 5 mL via ORAL
  Filled 2014-09-06: qty 5

## 2014-09-06 MED ORDER — OXYCODONE HCL 5 MG PO TABS
5.0000 mg | ORAL_TABLET | ORAL | Status: DC | PRN
  Administered 2014-09-06 – 2014-09-07 (×4): 5 mg via ORAL
  Filled 2014-09-06 (×5): qty 1

## 2014-09-06 MED ORDER — GUAIFENESIN-DM 100-10 MG/5ML PO SYRP
5.0000 mL | ORAL_SOLUTION | ORAL | Status: DC | PRN
  Administered 2014-09-06 – 2014-09-07 (×2): 5 mL via ORAL
  Filled 2014-09-06 (×4): qty 10

## 2014-09-06 MED ORDER — OSELTAMIVIR PHOSPHATE 75 MG PO CAPS
75.0000 mg | ORAL_CAPSULE | Freq: Two times a day (BID) | ORAL | Status: DC
  Administered 2014-09-06 – 2014-09-07 (×3): 75 mg via ORAL
  Filled 2014-09-06 (×4): qty 1

## 2014-09-06 NOTE — Progress Notes (Signed)
NUTRITION BRIEF NOTE  Pt identified due to Malnutrition Screening Tool  Weight: Wt Readings from Last 1 Encounters:  09/05/14 148 lb 2.4 oz (67.2 kg)   Body mass index is 23.92 kg/m(^2). Pt meets criteria for Normal body weight based on current BMI.  Current diet order is Regular. Pt reported feeling hungry upon assessment, and denied any nausea, vomiting or diarrhea. He is currently receiving thiamine, MVI and folic acid for anemia.  Pt denied any weight loss. NFPE did not reveal any subcutaneous body fat or muscle mass depletion.  No nutrition interventions warranted at this time. If any nutrition issues arise, please consult RD.  Wynona Dove, MS Dietetic Intern Pager: [phone]

## 2014-09-06 NOTE — Progress Notes (Signed)
Progress Note   Travis Mcdaniel IZT:[id number] DOB: 1970-07-25 DOA: 09/05/2014 PCP: No primary care provider on file.   Brief Narrative:   Travis Mcdaniel is an 44 y.o. male with a PMH of alcohol abuse and diverticulosis who was admitted on 09/05/14 with a chief complaint of a 2 day history of fatigue, nausea/vomiting and diarrhea/myalgias. On the day of admission, he also reported having hematuria. ER workup showed creatinine of 1.4 initially, trended up to 1.8 AST elevated 600, ALT 45, bilirubin 4.3, severe anemia with hemoglobin of 3.9, hematocrit 13.2, WBC 5.1, platelets 119, MCV 89.2. UA positive for nitrite, leukocytes, WBCs 21-50, ketones positive. Fecal occult blood test negative. Hematology consultation was requested to evaluate for hemolytic anemia.  Assessment/Plan:   Principal Problem:  Hemolytic anemia - Associated with transaminitis, hematuria, diarrhea, acute kidney injury. - Status post 4 units of packed RBCs. Posttransfusion hemoglobin 8.1. - Hemolytic workup shows LDH elevated at 3088, total bili 3.6, haptoglobin < 10, . - Dr. Julien Nordmann following. - CT scan of abdomen/pelvis done 09/05/14, no acute intra-abdominal abnormalities. Spleen appeared normal. - DIC panel requested.  No schistocytes on smear. No coagulopathy. - Continue prednisone with gradual taper after improvement.  Active Problems:   Sepsis secondary to influenza A/H1N1 and UTI - Start Tamiflu.  On Rocephin for UTI.   Acute kidney injury - Continue aggressive IV fluid hydration, blood transfusion, treat UTI. - No hydronephrosis noted on CT scan.   UTI (lower urinary tract infection) - Continue Rocephin. Follow-up cultures. - Granular casts with many bacteria and WBCs noted on urinalysis.   Transaminitis with hyperbilirubinemia - Acute liver injury of unclear etiology, but patient has history of alcohol abuse and now has hemolytic anemia/sepsis. - Acute hepatitis panel negative. CT of the  abdomen unremarkable.   Hematuria - Continue blood transfusion, treat UTI.   Alcohol dependence - Placed on CIWA scale with Ativan, continue thiamine, folate, MVI.   Diarrhea - Follow stool studies, ova and parasites, C. difficile, GI pathogen panel, fecal lactoferrin.     DVT Prophylaxis - SCDs ordered.  Code Status: Full. Family Communication: No family at the bedside. Disposition Plan: Home when stable.   IV Access:    Peripheral IV   Procedures and diagnostic studies:   Ct Abdomen Pelvis Wo Contrast 09/05/2014: No acute process demonstrated in the abdomen or pelvis on noncontrast imaging. No evidence of bowel obstruction or significant bowel wall thickening.     Medical Consultants:    Dr. Curt Bears, Hematology.  Anti-Infectives:    Rocephin 09/05/14--->  Tamiflu 09/06/14--->  Subjective:   Travis Mcdaniel complains of occasional diarrhea but no nausea or vomiting and wants his diet advanced. Has significant myalgias as is requesting pain medication. Also reports cough. No dyspnea.  Objective:    Filed Vitals:   09/06/14 0800 09/06/14 0950 09/06/14 1000 09/06/14 1200  BP: 144/70 137/74 134/83 133/80  Pulse: 77 74 86   Temp: 98.5 F (36.9 C)   98.5 F (36.9 C)  TempSrc: Oral   Oral  Resp: 24 18 21    Height:      Weight:      SpO2: 99% 99% 99%     Intake/Output Summary (Last 24 hours) at 09/06/14 1433 Last data filed at 09/06/14 1400  Gross per 24 hour  Intake 4173.83 ml  Output   2300 ml  Net 1873.83 ml    Exam: Gen:  NAD, jaundiced Cardiovascular:  RRR, No M/R/G Respiratory:  Lungs  CTAB Gastrointestinal:  Abdomen soft, NT/ND, + BS Extremities:  No C/E/C   Data Reviewed:    Labs: Basic Metabolic Panel:  Recent Labs Lab 09/05/14 1438 09/05/14 1540 09/06/14 0655  NA 134* 135 137  K 4.0 4.2 3.7  CL 104 102 111  CO2 21  --  20  GLUCOSE 131* 128* 120*  BUN 23 28* 17  CREATININE 1.44* 1.80* 1.14  CALCIUM 8.7  --   8.3*   GFR Estimated Creatinine Clearance: 73.1 mL/min (by C-G formula based on Cr of 1.14). Liver Function Tests:  Recent Labs Lab 09/05/14 1438 09/05/14 1702 09/06/14 0655  AST 600*  --  410*  ALT 45  --  35  ALKPHOS 34*  --  32*  BILITOT 4.3* 3.6* 1.8*  PROT 7.3  --  6.7  ALBUMIN 4.2  --  3.7    Recent Labs Lab 09/05/14 1438  LIPASE 58   Coagulation profile  Recent Labs Lab 09/06/14 0916  INR 0.94    CBC:  Recent Labs Lab 09/05/14 1438 09/05/14 1540 09/06/14 0655 09/06/14 0916 09/06/14 1307  WBC 5.1  --  4.0  --   --   NEUTROABS 3.9  --   --   --   --   HGB 3.9* 4.4* 7.8*  --  8.1*  HCT 13.2* 13.0* 23.7*  --  24.7*  MCV 89.2  --  85.6  --   --   PLT 119*  --  79* 98*  --    D-Dimer:  Recent Labs  09/06/14 0916  DDIMER 1.15*   Thyroid function studies:  Recent Labs  09/06/14 0655  TSH 3.282   Anemia work up:  Recent Labs  09/05/14 1549  VITAMINB12 370  FOLATE >20.0  FERRITIN 153  TIBC 239  IRON 129  RETICCTPCT 7.2*    Microbiology Recent Results (from the past 240 hour(s))  MRSA PCR Screening     Status: None   Collection Time: 09/05/14  7:59 PM  Result Value Ref Range Status   MRSA by PCR NEGATIVE NEGATIVE Final    Comment:        The GeneXpert MRSA Assay (FDA approved for NASAL specimens only), is one component of a comprehensive MRSA colonization surveillance program. It is not intended to diagnose MRSA infection nor to guide or monitor treatment for MRSA infections. Performed at [facility]      Medications:   . cefTRIAXone (ROCEPHIN)  IV  1 g Intravenous Q24H  . folic acid  1 mg Oral Daily  . multivitamin with minerals  1 tablet Oral Daily  . oseltamivir  75 mg Oral BID  . predniSONE  80 mg Oral Q breakfast  . sodium chloride  3 mL Intravenous Q12H  . thiamine  100 mg Oral Daily   Or  . thiamine  100 mg Intravenous Daily   Continuous Infusions: . sodium chloride 125 mL/hr at 09/06/14 1103     Time spent: 35 minutes.  The patient is medically complex and requires high complexity decision making.    LOS: 1 day   RAMA,CHRISTINA  [facility] Pager [phone]. If unable to reach me by pager, please call my cell phone at [phone].  *Please refer to [email or web address], password [email or web address] to get updated schedule on who will round on this patient, as hospitalists switch teams weekly. If 7PM-7AM, please contact night-coverage at [email or web address], password [email or web address] for any overnight needs.  09/06/2014, 2:33 PM

## 2014-09-06 NOTE — Care Management Note (Signed)
CARE MANAGEMENT NOTE 09/06/2014  Patient:  Travis Mcdaniel, Travis Mcdaniel   Account Number:  [id number]  Date Initiated:  09/06/2014  Documentation initiated by:  Shellye Zandi  Subjective/Objective Assessment:   patient admitted with hgb of 5.2, etoh abuse, reports hematuria and jaundice     Action/Plan:   home when stable   Anticipated DC Date:  09/09/2014   Anticipated DC Plan:  HOME/SELF CARE  In-house referral  NA      DC Planning Services  CM consult      Choice offered to / List presented to:             Status of service:  In process, will continue to follow Medicare Important Message given?   (If response is "NO", the following Medicare IM given date fields will be blank) Date Medicare IM given:   Medicare IM given by:   Date Additional Medicare IM given:   Additional Medicare IM given by:    Discharge Disposition:    Per UR Regulation:  Reviewed for med. necessity/level of care/duration of stay  If discussed at [place] of Stay Meetings, dates discussed:    Comments:  September 06, 2014/Sharlyne Koeneman L. Rosana Hoes, RN, BSN, CCM. Case Management [place] [phone] No discharge needs present of time of review.

## 2014-09-06 NOTE — Progress Notes (Signed)
Subjective: The patient is seen and examined today. He is feeling much better after receiving PRBCs transfusion last night. The color of his urine has improved. He continues to have few episodes of diarrhea. He denied having any significant nausea or vomiting. No current fever or chills.  Objective: Vital signs in last 24 hours: Temp:  [98 F (36.7 C)-99.7 F (37.6 C)] 98.5 F (36.9 C) (03/10 0800) Pulse Rate:  [67-106] 85 (03/10 0600) Resp:  [14-28] 26 (03/10 0600) BP: (117-167)/(54-77) 158/54 mmHg (03/10 0600) SpO2:  [94 %-100 %] 98 % (03/10 0600) Weight:  [148 lb 2.4 oz (67.2 kg)] 148 lb 2.4 oz (67.2 kg) (03/09 2000)  Intake/Output from previous day: 03/09 0701 - 03/10 0700 In: 3295.8 [P.O.:720; I.V.:1520.8; Blood:1055] Out: 1300 [Urine:1300] Intake/Output this shift: Total I/O In: -  Out: 300 [Urine:300]  General appearance: alert, cooperative, fatigued and no distress Resp: clear to auscultation bilaterally Cardio: regular rate and rhythm, S1, S2 normal, no murmur, click, rub or gallop GI: soft, non-tender; bowel sounds normal; no masses,  no organomegaly Extremities: extremities normal, atraumatic, no cyanosis or edema  Lab Results:   Recent Labs  09/05/14 1438 09/05/14 1540 09/06/14 0655  WBC 5.1  --  4.0  HGB 3.9* 4.4* 7.8*  HCT 13.2* 13.0* 23.7*  PLT 119*  --  79*   BMET  Recent Labs  09/05/14 1438 09/05/14 1540  NA 134* 135  K 4.0 4.2  CL 104 102  CO2 21  --   GLUCOSE 131* 128*  BUN 23 28*  CREATININE 1.44* 1.80*  CALCIUM 8.7  --     Studies/Results: Ct Abdomen Pelvis Wo Contrast  09/05/2014   CLINICAL DATA:  I fatigue, nausea, vomiting, and diarrhea for 2 days. Of gross hematuria onset today. No IV contrast material due to acute renal injury.  EXAM: CT ABDOMEN AND PELVIS WITHOUT CONTRAST  TECHNIQUE: Multidetector CT imaging of the abdomen and pelvis was performed following the standard protocol without IV contrast.  COMPARISON:  01/16/2005   FINDINGS: Lung bases are clear, allowing for motion artifact.  Evaluation of solid organs and vascular structures is limited without IV contrast material. Scattered subcentimeter low-attenuation lesions in the liver are too small to characterize but likely represent small cysts. The unenhanced appearance of the gallbladder, spleen, pancreas, adrenal glands, kidneys, inferior vena cava, and retroperitoneal lymph nodes is unremarkable. Calcification of aorta without aneurysm. Mesenteric and celiac axis lymph nodes are present without pathologic enlargement, likely reactive. Stomach, small bowel, and colon are not abnormally distended. Contrast material flows through to the colon without evidence of bowel obstruction. No bowel wall thickening is appreciated. No free air or free fluid in the abdomen. Abdominal wall musculature appears intact.  Pelvis: Diverticulosis of the sigmoid colon without evidence of diverticulitis. Appendix is normal. Bladder wall is not thickened. Prostate gland is not enlarged. No free or loculated pelvic fluid collections. No pelvic mass or lymphadenopathy. Degenerative changes in the spine. No destructive bone lesions.  IMPRESSION: No acute process demonstrated in the abdomen or pelvis on noncontrast imaging. No evidence of bowel obstruction or significant bowel wall thickening.   Electronically Signed   By: Lucienne Capers M.D.   On: 09/05/2014 19:27    Medications: I have reviewed the patient's current medications.  Assessment/Plan: This is a very pleasant 44 years old white male with severe anemia and thrombocytopenia secondary to hemolytic anemia with significant elevation of LDH and very low haptoglobin. Reviewing the peripheral blood smear showed no significant  schistocytes. The patient was started on treatment with high-dose prednisone 80 mg by mouth daily. There is significant improvement in his serum creatinine as well as total bilirubin and liver enzymes. His influenza  panel is positive for influenza A as well as H1N1. This could be the predisposing etiology for his hemolytic anemia. The patient will continue on high-dose prednisone for now and this will be tapered gradually after improvement of his condition. I will continue to monitor the patient closely with you during this admission. Please call if you have any questions.   LOS: 1 day    Glendola Friedhoff K. 09/06/2014

## 2014-09-07 ENCOUNTER — Telehealth: Payer: Self-pay | Admitting: Internal Medicine

## 2014-09-07 ENCOUNTER — Other Ambulatory Visit: Payer: Self-pay | Admitting: Internal Medicine

## 2014-09-07 DIAGNOSIS — D589 Hereditary hemolytic anemia, unspecified: Secondary | ICD-10-CM

## 2014-09-07 LAB — COMPREHENSIVE METABOLIC PANEL
ALT: 27 U/L (ref 0–53)
ANION GAP: 8 (ref 5–15)
AST: 120 U/L — ABNORMAL HIGH (ref 0–37)
Albumin: 3.3 g/dL — ABNORMAL LOW (ref 3.5–5.2)
Alkaline Phosphatase: 30 U/L — ABNORMAL LOW (ref 39–117)
BILIRUBIN TOTAL: 0.7 mg/dL (ref 0.3–1.2)
BUN: 12 mg/dL (ref 6–23)
CO2: 22 mmol/L (ref 19–32)
CREATININE: 0.99 mg/dL (ref 0.50–1.35)
Calcium: 8.3 mg/dL — ABNORMAL LOW (ref 8.4–10.5)
Chloride: 110 mmol/L (ref 96–112)
GFR calc Af Amer: >90 mL/min (ref >90)
GFR calc non Af Amer: >90 mL/min (ref >90)
Glucose, Bld: 103 mg/dL — ABNORMAL HIGH (ref 70–99)
Potassium: 3.9 mmol/L (ref 3.5–5.1)
Sodium: 140 mmol/L (ref 135–145)
TOTAL PROTEIN: 6.2 g/dL (ref 6.0–8.3)

## 2014-09-07 LAB — CBC
HCT: 24.3 % — ABNORMAL LOW (ref 39.0–52.0)
HEMOGLOBIN: 7.6 g/dL — AB (ref 13.0–17.0)
MCH: 27.6 pg (ref 26.0–34.0)
MCHC: 31.3 g/dL (ref 30.0–36.0)
MCV: 88.4 fL (ref 78.0–100.0)
Platelets: 92 10*3/uL — ABNORMAL LOW (ref 150–400)
RBC: 2.75 MIL/uL — ABNORMAL LOW (ref 4.22–5.81)
RDW: 16.7 % — AB (ref 11.5–15.5)
WBC: 3.1 10*3/uL — ABNORMAL LOW (ref 4.0–10.5)

## 2014-09-07 LAB — TYPE AND SCREEN
ABO/RH(D): B POS
Antibody Screen: NEGATIVE
UNIT DIVISION: 0
Unit division: 0
Unit division: 0
Unit division: 0

## 2014-09-07 LAB — FECAL LACTOFERRIN, QUANT: Fecal Lactoferrin: NEGATIVE

## 2014-09-07 LAB — URINE CULTURE
COLONY COUNT: NO GROWTH
COLONY COUNT: NO GROWTH
Culture: NO GROWTH
Culture: NO GROWTH

## 2014-09-07 LAB — OVA AND PARASITE EXAMINATION: Ova and parasites: NONE SEEN

## 2014-09-07 MED ORDER — GUAIFENESIN-DM 100-10 MG/5ML PO SYRP: 5.0000 mL | ORAL_SOLUTION | ORAL | Status: DC | PRN

## 2014-09-07 MED ORDER — ADULT MULTIVITAMIN W/MINERALS CH: 1.0000 | ORAL_TABLET | Freq: Every day | ORAL | Status: DC

## 2014-09-07 MED ORDER — OXYCODONE HCL 5 MG PO TABS: 5.0000 mg | ORAL_TABLET | ORAL | Status: DC | PRN

## 2014-09-07 MED ORDER — PANTOPRAZOLE SODIUM 40 MG PO TBEC: 40.0000 mg | DELAYED_RELEASE_TABLET | Freq: Every day | ORAL | Status: DC

## 2014-09-07 MED ORDER — OSELTAMIVIR PHOSPHATE 75 MG PO CAPS: 75.0000 mg | ORAL_CAPSULE | Freq: Two times a day (BID) | ORAL | Status: DC

## 2014-09-07 MED ORDER — PREDNISONE 20 MG PO TABS: 80.0000 mg | ORAL_TABLET | Freq: Every day | ORAL | Status: DC

## 2014-09-07 MED ORDER — FOLIC ACID 1 MG PO TABS: 1.0000 mg | ORAL_TABLET | Freq: Every day | ORAL | Status: DC

## 2014-09-07 MED ORDER — LORAZEPAM 0.5 MG PO TABS: 0.5000 mg | ORAL_TABLET | Freq: Three times a day (TID) | ORAL | Status: DC

## 2014-09-07 MED ORDER — HYDROCOD POLST-CHLORPHEN POLST 10-8 MG/5ML PO LQCR: 5.0000 mL | Freq: Two times a day (BID) | ORAL | Status: DC | PRN

## 2014-09-07 NOTE — Telephone Encounter (Signed)
per pof moved appt to 3.18.[name] KitchenMarland Kitchenpt awareof appt

## 2014-09-07 NOTE — Discharge Summary (Signed)
Physician Discharge Summary  Travis Mcdaniel JSE:[id number] DOB: [birth year] DOA: 09/05/2014  PCP: No primary care provider on file.  Admit date: 09/05/2014 Discharge date: 09/07/2014   Recommendations for Outpatient Follow-Up:   1. The patient will F/U with Dr. Julien Nordmann in 1 week. 2. The patient was provided with the physician referral number to arrange to obtain a PCP and advised that no refills on controlled substances would be provided. 3. Follow stool studies, ova and parasites, C. difficile, GI pathogen panel,  which are pending at discharge. 4. Recommend repeat CMET/CBC in 1 week.   Discharge Diagnosis:   Principal Problem:    Hemolytic anemia Active Problems:    Acute kidney injury    UTI (lower urinary tract infection)    Transaminitis    Hematuria    Alcohol dependence    Diarrhea    Influenza A (H1N1)   Discharge Condition: Improved.  Diet recommendation:  Regular.   History of Present Illness:   Travis Mcdaniel is an 44 y.o. male with a PMH of alcohol abuse and diverticulosis who was admitted on 09/05/14 with a chief complaint of a 2 day history of fatigue, nausea/vomiting and diarrhea/myalgias. On the day of admission, he also reported having hematuria. ER workup showed creatinine of 1.4 initially, trended up to 1.8 AST elevated 600, ALT 45, bilirubin 4.3, severe anemia with hemoglobin of 3.9, hematocrit 13.2, WBC 5.1, platelets 119, MCV 89.2. UA positive for nitrite, leukocytes, WBCs 21-50, ketones positive. Fecal occult blood test negative. Hematology consultation was requested to evaluate for hemolytic anemia.   Hospital Course by Problem:   Principal Problem:  Hemolytic anemia thought to be from influenza related sepsis - Associated with transaminitis, hematuria, diarrhea, acute kidney injury. - Status post 4 units of packed RBCs. Posttransfusion hemoglobin 8.1, now 7.6. - Hemolytic workup showed LDH elevated at 3088, total bili 3.6, haptoglobin  < 10. - Dr. Julien Nordmann consulted and will see him in 1 week for hospital follow up. - CT scan of abdomen/pelvis done 09/05/14, no acute intra-abdominal abnormalities. Spleen appeared normal. - DIC panel showed no schistocytes on smear. No coagulopathy. - Continue prednisone 80 mg daily with gradual taper after improvement.  PPI added for GI protection.  Active Problems:  Sepsis secondary to influenza A/H1N1 and UTI - Continue Tamiflu for 5 day course. On Rocephin for UTI, will D/C now as urine culture negative.   Acute kidney injury - Resolved with aggressive IV fluid hydration, blood transfusion, treatment of UTI and flu. - No hydronephrosis noted on CT scan.   UTI (lower urinary tract infection) - Treated with Rocephin. Cultures negative. - Granular casts with many bacteria and WBCs noted on urinalysis.   Transaminitis with hyperbilirubinemia - Acute liver injury of unclear etiology, but patient has history of alcohol abuse and now has hemolytic anemia/sepsis. - Acute hepatitis panel negative. CT of the abdomen unremarkable. - LFTs now trending down.   Hematuria - Resolved.    Alcohol dependence - Treated with detox protocol/ CIWA scale with Ativan, continue thiamine, folate, MVI. - Ativan 0.5 mg TID x 3 days, BID x 3 days, daily x 3 days then stop. Patient advised to avoid ETOH.   Diarrhea - Follow stool studies, ova and parasites, C. difficile, GI pathogen panel; fecal lactoferrin negative.     Medical Consultants:    None.   Discharge Exam:   Filed Vitals:   09/07/14 0800  BP: 174/71  Pulse: 80  Temp:   Resp: 19  Filed Vitals:   09/07/14 0400 09/07/14 0413 09/07/14 0659 09/07/14 0800  BP: 164/81 150/57  174/71  Pulse: 63 67  80  Temp: 98.4 F (36.9 C)  98.5 F (36.9 C)   TempSrc: Oral  Oral   Resp: 18 16  19   Height:      Weight:      SpO2: 100% 100%  100%    Gen:  NAD, jaundice resolving Cardiovascular:  RRR, No M/R/G Respiratory: Lungs  CTAB Gastrointestinal: Abdomen soft, NT/ND with normal active bowel sounds. Extremities: No C/E/C   The results of significant diagnostics from this hospitalization (including imaging, microbiology, ancillary and laboratory) are listed below for reference.     Procedures and Diagnostic Studies:   Ct Abdomen Pelvis Wo Contrast 09/05/2014: No acute process demonstrated in the abdomen or pelvis on noncontrast imaging. No evidence of bowel obstruction or significant bowel wall thickening.    Labs:   Basic Metabolic Panel:  Recent Labs Lab 09/05/14 1438 09/05/14 1540 09/06/14 0655 09/07/14 0407  NA 134* 135 137 140  K 4.0 4.2 3.7 3.9  CL 104 102 111 110  CO2 21  --  20 22  GLUCOSE 131* 128* 120* 103*  BUN 23 28* 17 12  CREATININE 1.44* 1.80* 1.14 0.99  CALCIUM 8.7  --  8.3* 8.3*   GFR Estimated Creatinine Clearance: 84.1 mL/min (by C-G formula based on Cr of 0.99). Liver Function Tests:  Recent Labs Lab 09/05/14 1438 09/05/14 1702 09/06/14 0655 09/07/14 0407  AST 600*  --  410* 120*  ALT 45  --  35 27  ALKPHOS 34*  --  32* 30*  BILITOT 4.3* 3.6* 1.8* 0.7  PROT 7.3  --  6.7 6.2  ALBUMIN 4.2  --  3.7 3.3*    Recent Labs Lab 09/05/14 1438  LIPASE 58   Coagulation profile  Recent Labs Lab 09/06/14 0916  INR 0.94    CBC:  Recent Labs Lab 09/05/14 1438 09/05/14 1540 09/06/14 0655 09/06/14 0916 09/06/14 1307 09/07/14 0407  WBC 5.1  --  4.0  --   --  3.1*  NEUTROABS 3.9  --   --   --   --   --   HGB 3.9* 4.4* 7.8*  --  8.1* 7.6*  HCT 13.2* 13.0* 23.7*  --  24.7* 24.3*  MCV 89.2  --  85.6  --   --  88.4  PLT 119*  --  79* 98*  --  92*   D-Dimer  Recent Labs  09/06/14 0916  DDIMER 1.15*   Thyroid function studies  Recent Labs  09/06/14 0655  TSH 3.282   Anemia work up  Recent Labs  09/05/14 1549  VITAMINB12 370  FOLATE >20.0  FERRITIN 153  TIBC 239  IRON 129  RETICCTPCT 7.2*   Microbiology Recent Results (from the past 240  hour(s))  Urine culture     Status: None   Collection Time: 09/05/14  5:04 PM  Result Value Ref Range Status   Specimen Description URINE, RANDOM  Final   Special Requests NONE  Final   Colony Count NO GROWTH Performed at [facility]   Final   Culture NO GROWTH Performed at [facility]   Final   Report Status 09/07/2014 FINAL  Final  MRSA PCR Screening     Status: None   Collection Time: 09/05/14  7:59 PM  Result Value Ref Range Status   MRSA by PCR NEGATIVE NEGATIVE Final  Comment:        The GeneXpert MRSA Assay (FDA approved for NASAL specimens only), is one component of a comprehensive MRSA colonization surveillance program. It is not intended to diagnose MRSA infection nor to guide or monitor treatment for MRSA infections. Performed at [facility]      Discharge Instructions:   Discharge Instructions    Call MD for:  extreme fatigue    Complete by:  As directed      Call MD for:  persistant nausea and vomiting    Complete by:  As directed      Call MD for:  temperature >100.4    Complete by:  As directed      Diet general    Complete by:  As directed      Discharge instructions    Complete by:  As directed   You were cared for by Dr. Jacquelynn Cree  (a hospitalist) during your hospital stay. If you have any questions about your discharge medications or the care you received while you were in the hospital after you are discharged, you can call the unit and ask to speak with the hospitalist on call if the hospitalist that took care of you is not available. Once you are discharged, your primary care physician will handle any further medical issues. Please note that NO REFILLS for any discharge medications will be authorized once you are discharged, as it is imperative that you return to your primary care physician (or establish a relationship with a primary care physician if you do not have one) for your aftercare needs so that they can  reassess your need for medications and monitor your lab values.  Any outstanding tests can be reviewed by your PCP at your follow up visit.  It is also important to review any medicine changes with your PCP.  Please bring these d/c instructions with you to your next visit so your physician can review these changes with you.  If you do not have a primary care physician, you can call [phone] for a physician referral.  It is highly recommended that you obtain a PCP for hospital follow up.     Increase activity slowly    Complete by:  As directed             Medication List    STOP taking these medications        ACETAMINOPHEN PO      TAKE these medications        BC HEADACHE POWDER PO  Take 1 packet by mouth daily as needed (pain.).     celecoxib 100 MG capsule  Commonly known as:  CELEBREX  Take 100-200 mg by mouth daily as needed for mild pain.     chlorpheniramine-HYDROcodone 10-8 MG/5ML Lqcr  Commonly known as:  TUSSIONEX PENNKINETIC ER  Take 5 mLs by mouth every 12 (twelve) hours as needed for cough.     folic acid 1 MG tablet  Commonly known as:  FOLVITE  Take 1 tablet (1 mg total) by mouth daily.     guaiFENesin-dextromethorphan 100-10 MG/5ML syrup  Commonly known as:  ROBITUSSIN DM  Take 5 mLs by mouth every 4 (four) hours as needed for cough.     LORazepam 0.5 MG tablet  Commonly known as:  ATIVAN  Take 1 tablet (0.5 mg total) by mouth every 8 (eight) hours.     multivitamin with minerals Tabs tablet  Take 1 tablet by mouth daily.  multivitamin with minerals Tabs tablet  Take 1 tablet by mouth daily.     oseltamivir 75 MG capsule  Commonly known as:  TAMIFLU  Take 1 capsule (75 mg total) by mouth 2 (two) times daily.     oxyCODONE 5 MG immediate release tablet  Commonly known as:  Oxy IR/ROXICODONE  Take 1 tablet (5 mg total) by mouth every 4 (four) hours as needed for severe pain.     pantoprazole 40 MG tablet  Commonly known as:  PROTONIX  Take 1  tablet (40 mg total) by mouth daily.     predniSONE 20 MG tablet  Commonly known as:  DELTASONE  Take 4 tablets (80 mg total) by mouth daily with breakfast.           Follow-up Information    Follow up with Eilleen Kempf., MD.   Specialty:  Oncology   Contact information:   [place] [id number] [phone]        Time coordinating discharge: 35 minutes.  Signed:  RAMA,CHRISTINA  Pager [phone] [facility] 09/07/2014, 9:17 AM

## 2014-09-07 NOTE — Progress Notes (Signed)
09/07/14 1042 Reviewed discharge instructions with patient. Patient verbalized understanding of discharge instructions. Appt made for 09/14/14 with Dr Inda Merlin. Pt aware. Copy of discharge and prescriptions given.

## 2014-09-07 NOTE — Progress Notes (Signed)
[id number] Tameko Halder,RN,BSN,CCM:  Patient refused information for [facility] outpatient treatment centers.  Rn in charge of patient made aware.

## 2014-09-07 NOTE — Discharge Instructions (Signed)
Hemolytic Anemia °Anemia is a condition in which you do not have enough red blood cells to carry oxygen throughout your body. Hemolytic anemia occurs when your red blood cells are being destroyed faster than they are being produced. Hemolytic anemia can affect people of all ages. It may worsen existing heart or lung disease. There are many types of hemolytic anemia, and they can be divided into two different groups: inherited and acquired. °Inherited hemolytic anemia is due to a gene that your parents passed on to you. The abnormal cells may break down while moving through your circulatory system. Your spleen may remove the abnormal blood cells and debris from your blood stream. Acquired hemolytic anemia occurs when your red blood cells are destroyed either by certain medicines that you have used or as a result of infections or diseases that you have. °CAUSES  °Hemolytic anemia is caused by red blood cell destruction. Sometimes the reasons for the destruction are not clear. Known causes include: °· Inherited disorders, such as sickle cell anemia and thalassemias. °· Use of certain medicines. °· Blood infection (septicemia). °· Exposure to toxic chemicals or excessive radiation. °· Reactions to blood transfusions. °· Certain immune disorders. °· Artificial heart valves. °· Enlarged spleens. °SYMPTOMS  °· Pale skin, eyes, and fingernails. °· Irregular or fast heartbeat. °· Headaches. °· Tiredness (fatigue) and weakness. °· Dizziness or fainting. °· Shortness of breath. °· Yellowing of the skin or eyes (jaundice). °· Chest pain. °· Cold hands and feet. °DIAGNOSIS  °Your health care provider will do a physical exam and ask questions about your symptoms. Blood tests, urine tests, and taking bone marrow tissue (biopsies) may be done to help find the cause of your anemia.  °TREATMENT °Treatment depends on the cause of your anemia. Treatment may include: °· Medicines. °· Blood transfusions. °· Plasmapheresis. °· Blood and  bone marrow stem cell transplant. °· Surgery to remove the spleen. °HOME CARE INSTRUCTIONS  °· Only take over-the-counter or prescription medicines as directed by your health care provider. If you are given antibiotics, take them as directed. Finish them even if you start to feel better. °· Take over-the-counter iron supplements as directed by your health care provider. °· Decrease the chances of getting sick by: °¨ Washing your hands often. °¨ Staying away from people who are sick. °¨ Getting a flu shot and pneumonia shot if recommended by your health care provider. °· Avoid certain kinds of foods that can expose you to bacteria, such as uncooked foods. °· Keep all follow-up appointments with your health care provider. °SEEK MEDICAL CARE IF:  °· You become dizzy or tired easily. °· Your skin looks pale. °· You feel your heart beating faster than normal. °· You feel like your heart has skipped or stopped beats (irregular heartbeat). °SEEK IMMEDIATE MEDICAL CARE IF:  °· Your skin and eyes turn yellow. °· You develop chest pain. °· You become short of breath. °· You faint. °· You develop an uncontrolled cough. °MAKE SURE YOU:  °· Understand these instructions. °· Will watch your condition. °· Will get help right away if you are not doing well or get worse. °Document Released: 06/15/2005 Document Revised: 02/15/2013 Document Reviewed: 11/02/2012 °[employer]® Patient Information ©2015 [employer]. This information is not intended to replace advice given to you by your health care provider. Make sure you discuss any questions you have with your health care provider. ° °

## 2014-09-07 NOTE — Telephone Encounter (Signed)
Dr. Rockne Menghini called to refer pt to Dr. Julien Nordmann on 10/03/14@11 :00 Dx-Anemia Pt is aware

## 2014-09-08 LAB — GI PATHOGEN PANEL BY PCR, STOOL
C DIFFICILE TOXIN A/B: NOT DETECTED
CAMPYLOBACTER BY PCR: NOT DETECTED
Cryptosporidium by PCR: NOT DETECTED
E COLI (ETEC) LT/ST: NOT DETECTED
E COLI (STEC): NOT DETECTED
E COLI 0157 BY PCR: NOT DETECTED
G LAMBLIA BY PCR: NOT DETECTED
NOROVIRUS G1/G2: NOT DETECTED
ROTAVIRUS A BY PCR: NOT DETECTED
Salmonella by PCR: NOT DETECTED
Shigella by PCR: NOT DETECTED

## 2014-09-14 ENCOUNTER — Encounter: Payer: Self-pay | Admitting: Internal Medicine

## 2014-09-14 ENCOUNTER — Other Ambulatory Visit (HOSPITAL_BASED_OUTPATIENT_CLINIC_OR_DEPARTMENT_OTHER): Payer: 59

## 2014-09-14 ENCOUNTER — Ambulatory Visit (HOSPITAL_BASED_OUTPATIENT_CLINIC_OR_DEPARTMENT_OTHER): Payer: 59 | Admitting: Internal Medicine

## 2014-09-14 ENCOUNTER — Other Ambulatory Visit: Payer: Self-pay | Admitting: Medical Oncology

## 2014-09-14 ENCOUNTER — Ambulatory Visit: Payer: 59

## 2014-09-14 VITALS — BP 142/71 | HR 56 | Temp 98.4°F | Resp 18 | Ht 66.0 in | Wt 153.2 lb

## 2014-09-14 DIAGNOSIS — D599 Acquired hemolytic anemia, unspecified: Secondary | ICD-10-CM

## 2014-09-14 DIAGNOSIS — N179 Acute kidney failure, unspecified: Secondary | ICD-10-CM

## 2014-09-14 DIAGNOSIS — D593 Hemolytic-uremic syndrome, unspecified: Secondary | ICD-10-CM

## 2014-09-14 DIAGNOSIS — D589 Hereditary hemolytic anemia, unspecified: Secondary | ICD-10-CM

## 2014-09-14 LAB — CBC WITH DIFFERENTIAL/PLATELET
BASO%: 0.5 % (ref 0.0–2.0)
Basophils Absolute: 0 10*3/uL (ref 0.0–0.1)
EOS ABS: 0 10*3/uL (ref 0.0–0.5)
EOS%: 0.5 % (ref 0.0–7.0)
HCT: 35.7 % — ABNORMAL LOW (ref 38.4–49.9)
HGB: 10.6 g/dL — ABNORMAL LOW (ref 13.0–17.1)
LYMPH%: 31.5 % (ref 14.0–49.0)
MCH: 26.5 pg — ABNORMAL LOW (ref 27.2–33.4)
MCHC: 29.7 g/dL — AB (ref 32.0–36.0)
MCV: 89.2 fL (ref 79.3–98.0)
MONO#: 0.6 10*3/uL (ref 0.1–0.9)
MONO%: 8.5 % (ref 0.0–14.0)
NEUT%: 59 % (ref 39.0–75.0)
NEUTROS ABS: 4.3 10*3/uL (ref 1.5–6.5)
Platelets: 161 10*3/uL (ref 140–400)
RBC: 4 10*6/uL — ABNORMAL LOW (ref 4.20–5.82)
RDW: 18 % — ABNORMAL HIGH (ref 11.0–14.6)
WBC: 7.3 10*3/uL (ref 4.0–10.3)
lymph#: 2.3 10*3/uL (ref 0.9–3.3)

## 2014-09-14 LAB — COMPREHENSIVE METABOLIC PANEL (CC13)
ALT: 18 U/L (ref 0–55)
ANION GAP: 12 meq/L — AB (ref 3–11)
AST: 24 U/L (ref 5–34)
Albumin: 3.9 g/dL (ref 3.5–5.0)
Alkaline Phosphatase: 44 U/L (ref 40–150)
BUN: 16.4 mg/dL (ref 7.0–26.0)
CALCIUM: 9.1 mg/dL (ref 8.4–10.4)
CO2: 25 meq/L (ref 22–29)
CREATININE: 0.9 mg/dL (ref 0.7–1.3)
Chloride: 106 mEq/L (ref 98–109)
EGFR: >90 mL/min/{1.73_m2} (ref >90)
GLUCOSE: 96 mg/dL (ref 70–140)
Potassium: 4.1 mEq/L (ref 3.5–5.1)
SODIUM: 142 meq/L (ref 136–145)
Total Bilirubin: 0.96 mg/dL (ref 0.20–1.20)
Total Protein: 6.5 g/dL (ref 6.4–8.3)

## 2014-09-14 LAB — TECHNOLOGIST REVIEW

## 2014-09-14 LAB — LACTATE DEHYDROGENASE (CC13): LDH: 944 U/L — ABNORMAL HIGH (ref 125–245)

## 2014-09-14 MED ORDER — PREDNISONE 20 MG PO TABS: 20.0000 mg | ORAL_TABLET | Freq: Two times a day (BID) | ORAL | Status: DC

## 2014-09-14 NOTE — Progress Notes (Signed)
Called in prednisone rx

## 2014-09-14 NOTE — Progress Notes (Signed)
Checked in new pt with no financial concerns at this time.  Pt has Raquel's card for any billing questions or concerns. ° °

## 2014-09-16 NOTE — Progress Notes (Signed)
[place] Telephone:([phone]   Fax:([phone]  OFFICE PROGRESS NOTE  No PCP Per Patient No address on file  DIAGNOSIS: Hemolytic anemia secondary to recent infection with influenza A and H1N1  PRIOR THERAPY: None  CURRENT THERAPY: Tapered dose of prednisone currently on 40 mg by mouth twice a day  INTERVAL HISTORY: Travis Mcdaniel 44 y.o. male returns to the clinic today for hospital follow-up visit. The patient is feeling much better. He was seen in the hospital after presenting to the emergency department with significant fatigue, jaundice as well as hematuria. His hemoglobin at the time of admission was 3.9 g/dL. Blood work at that time showed haptoglobin less than 10 and LDH was 3088. The patient received 4 units of PRBCs transfusion. He was also started on prednisone 80 mg by mouth daily. He felt much better and was discharged from the hospital recently. He is here today for evaluation and recommendation regarding his condition. The patient denied having any significant complaints today. He is back to work. He denied having any significant chest pain, shortness breath, cough or hemoptysis. He denied having any bleeding issues and his hematuria had resolved. He is still on prednisone 80 mg by mouth daily.   MEDICAL HISTORY: Past Medical History  Diagnosis Date  . Diverticulosis     ALLERGIES:  has No Known Allergies.  MEDICATIONS:  Current Outpatient Prescriptions  Medication Sig Dispense Refill  . LORazepam (ATIVAN) 0.5 MG tablet Take 1 tablet (0.5 mg total) by mouth every 8 (eight) hours. 18 tablet 0  . Multiple Vitamin (MULTIVITAMIN WITH MINERALS) TABS tablet Take 1 tablet by mouth daily.    . predniSONE (DELTASONE) 20 MG tablet Take 1 tablet (20 mg total) by mouth 2 (two) times daily. Take 1 tablet in am and 1 tablet in pm x 7 days then take 1 tablet daily for 7 days 21 tablet 0  . Aspirin-Salicylamide-Caffeine (BC HEADACHE POWDER PO) Take 1  packet by mouth daily as needed (pain.).    [name] Kitchen celecoxib (CELEBREX) 100 MG capsule Take 100-200 mg by mouth daily as needed for mild pain.     No current facility-administered medications for this visit.    SURGICAL HISTORY: History reviewed. No pertinent past surgical history.  REVIEW OF SYSTEMS:  Constitutional: negative Eyes: negative Ears, nose, mouth, throat, and face: negative Respiratory: negative Cardiovascular: negative Gastrointestinal: negative Genitourinary:negative Integument/breast: negative Hematologic/lymphatic: negative Musculoskeletal:negative Neurological: negative Behavioral/Psych: negative Endocrine: negative Allergic/Immunologic: negative   PHYSICAL EXAMINATION: General appearance: alert, cooperative and no distress Head: Normocephalic, without obvious abnormality, atraumatic Neck: no adenopathy, no JVD, supple, symmetrical, trachea midline and thyroid not enlarged, symmetric, no tenderness/mass/nodules Lymph nodes: Cervical, supraclavicular, and axillary nodes normal. Resp: clear to auscultation bilaterally Back: symmetric, no curvature. ROM normal. No CVA tenderness. Cardio: regular rate and rhythm, S1, S2 normal, no murmur, click, rub or gallop GI: soft, non-tender; bowel sounds normal; no masses,  no organomegaly Extremities: extremities normal, atraumatic, no cyanosis or edema Neurologic: Alert and oriented X 3, normal strength and tone. Normal symmetric reflexes. Normal coordination and gait  ECOG PERFORMANCE STATUS: 0 - Asymptomatic  Blood pressure 142/71, pulse 56, temperature 98.4 F (36.9 C), temperature source Oral, resp. rate 18, height 5\' 6"  (1.676 m), weight 153 lb 3.2 oz (69.491 kg), SpO2 100 %.  LABORATORY DATA: Lab Results  Component Value Date   WBC 7.3 09/14/2014   HGB 10.6* 09/14/2014   HCT 35.7* 09/14/2014   MCV 89.2 09/14/2014  PLT 161 09/14/2014      Chemistry      Component Value Date/Time   NA 142 09/14/2014 0814   NA  140 09/07/2014 0407   K 4.1 09/14/2014 0814   K 3.9 09/07/2014 0407   CL 110 09/07/2014 0407   CO2 25 09/14/2014 0814   CO2 22 09/07/2014 0407   BUN 16.4 09/14/2014 0814   BUN 12 09/07/2014 0407   CREATININE 0.9 09/14/2014 0814   CREATININE 0.99 09/07/2014 0407      Component Value Date/Time   CALCIUM 9.1 09/14/2014 0814   CALCIUM 8.3* 09/07/2014 0407   ALKPHOS 44 09/14/2014 0814   ALKPHOS 30* 09/07/2014 0407   AST 24 09/14/2014 0814   AST 120* 09/07/2014 0407   ALT 18 09/14/2014 0814   ALT 27 09/07/2014 0407   BILITOT 0.96 09/14/2014 0814   BILITOT 0.7 09/07/2014 0407       RADIOGRAPHIC STUDIES: Ct Abdomen Pelvis Wo Contrast  09/05/2014   CLINICAL DATA:  I fatigue, nausea, vomiting, and diarrhea for 2 days. Of gross hematuria onset today. No IV contrast material due to acute renal injury.  EXAM: CT ABDOMEN AND PELVIS WITHOUT CONTRAST  TECHNIQUE: Multidetector CT imaging of the abdomen and pelvis was performed following the standard protocol without IV contrast.  COMPARISON:  01/16/2005  FINDINGS: Lung bases are clear, allowing for motion artifact.  Evaluation of solid organs and vascular structures is limited without IV contrast material. Scattered subcentimeter low-attenuation lesions in the liver are too small to characterize but likely represent small cysts. The unenhanced appearance of the gallbladder, spleen, pancreas, adrenal glands, kidneys, inferior vena cava, and retroperitoneal lymph nodes is unremarkable. Calcification of aorta without aneurysm. Mesenteric and celiac axis lymph nodes are present without pathologic enlargement, likely reactive. Stomach, small bowel, and colon are not abnormally distended. Contrast material flows through to the colon without evidence of bowel obstruction. No bowel wall thickening is appreciated. No free air or free fluid in the abdomen. Abdominal wall musculature appears intact.  Pelvis: Diverticulosis of the sigmoid colon without evidence of  diverticulitis. Appendix is normal. Bladder wall is not thickened. Prostate gland is not enlarged. No free or loculated pelvic fluid collections. No pelvic mass or lymphadenopathy. Degenerative changes in the spine. No destructive bone lesions.  IMPRESSION: No acute process demonstrated in the abdomen or pelvis on noncontrast imaging. No evidence of bowel obstruction or significant bowel wall thickening.   Electronically Signed   By: Lucienne Capers M.D.   On: 09/05/2014 19:27    ASSESSMENT AND PLAN: This is a very pleasant 44 years old white male recently diagnosed with hemolytic anemia secondary to viral infection with influenza A and H1N1.  He is currently on prednisone 80 mg by mouth daily and feels much better. His hemoglobin is 10.6 today. I will start tapering his dose of prednisone slowly. We will change his dose to prednisone 20 mg by mouth twice a day for 1 week followed by 20 mg by mouth daily for another week. I will see the patient back for follow-up visit in 2 weeks for reevaluation and recommendation regarding his condition at that time. His thrombocytopenia completely resolved. The patient was advised to call immediately if he has any concerning symptoms in the interval. The patient voices understanding of current disease status and treatment options and is in agreement with the current care plan.  All questions were answered. The patient knows to call the clinic with any problems, questions or concerns. We can  certainly see the patient much sooner if necessary.  I spent 15 minutes counseling the patient face to face. The total time spent in the appointment was 25 minutes.  Disclaimer: This note was dictated with voice recognition software. Similar sounding words can inadvertently be transcribed and may not be corrected upon review.

## 2014-09-17 ENCOUNTER — Telehealth: Payer: Self-pay | Admitting: Internal Medicine

## 2014-09-17 NOTE — Telephone Encounter (Signed)
spoke with patient and advised on 4.1 appt.[name] KitchenMarland KitchenMarland KitchenMarland Kitchenpatient ok and aware

## 2014-09-28 ENCOUNTER — Telehealth: Payer: Self-pay | Admitting: Medical Oncology

## 2014-09-28 ENCOUNTER — Ambulatory Visit (HOSPITAL_BASED_OUTPATIENT_CLINIC_OR_DEPARTMENT_OTHER): Payer: 59 | Admitting: Internal Medicine

## 2014-09-28 ENCOUNTER — Encounter: Payer: Self-pay | Admitting: Internal Medicine

## 2014-09-28 ENCOUNTER — Telehealth: Payer: Self-pay | Admitting: Internal Medicine

## 2014-09-28 ENCOUNTER — Other Ambulatory Visit (HOSPITAL_BASED_OUTPATIENT_CLINIC_OR_DEPARTMENT_OTHER): Payer: 59

## 2014-09-28 VITALS — BP 159/77 | HR 65 | Temp 98.2°F | Resp 18 | Ht 66.0 in | Wt 155.6 lb

## 2014-09-28 DIAGNOSIS — D599 Acquired hemolytic anemia, unspecified: Secondary | ICD-10-CM

## 2014-09-28 DIAGNOSIS — J101 Influenza due to other identified influenza virus with other respiratory manifestations: Secondary | ICD-10-CM

## 2014-09-28 DIAGNOSIS — D593 Hemolytic-uremic syndrome, unspecified: Secondary | ICD-10-CM

## 2014-09-28 DIAGNOSIS — B349 Viral infection, unspecified: Secondary | ICD-10-CM

## 2014-09-28 DIAGNOSIS — D589 Hereditary hemolytic anemia, unspecified: Secondary | ICD-10-CM

## 2014-09-28 DIAGNOSIS — D696 Thrombocytopenia, unspecified: Secondary | ICD-10-CM | POA: Diagnosis not present

## 2014-09-28 LAB — COMPREHENSIVE METABOLIC PANEL (CC13)
ALK PHOS: 58 U/L (ref 40–150)
ALT: 22 U/L (ref 0–55)
ANION GAP: 11 meq/L (ref 3–11)
AST: 36 U/L — AB (ref 5–34)
Albumin: 4 g/dL (ref 3.5–5.0)
BILIRUBIN TOTAL: 1.1 mg/dL (ref 0.20–1.20)
BUN: 14.3 mg/dL (ref 7.0–26.0)
CO2: 24 mEq/L (ref 22–29)
CREATININE: 0.9 mg/dL (ref 0.7–1.3)
Calcium: 9.2 mg/dL (ref 8.4–10.4)
Chloride: 106 mEq/L (ref 98–109)
EGFR: >90 mL/min/{1.73_m2} (ref >90)
Glucose: 101 mg/dl (ref 70–140)
Potassium: 4.1 mEq/L (ref 3.5–5.1)
Sodium: 142 mEq/L (ref 136–145)
TOTAL PROTEIN: 6.5 g/dL (ref 6.4–8.3)

## 2014-09-28 LAB — CBC WITH DIFFERENTIAL/PLATELET
BASO%: 0.6 % (ref 0.0–2.0)
BASOS ABS: 0 10*3/uL (ref 0.0–0.1)
EOS ABS: 0.1 10*3/uL (ref 0.0–0.5)
EOS%: 0.8 % (ref 0.0–7.0)
HCT: 37.9 % — ABNORMAL LOW (ref 38.4–49.9)
HEMOGLOBIN: 11.8 g/dL — AB (ref 13.0–17.1)
LYMPH#: 0.9 10*3/uL (ref 0.9–3.3)
LYMPH%: 12.2 % — ABNORMAL LOW (ref 14.0–49.0)
MCH: 27.2 pg (ref 27.2–33.4)
MCHC: 31 g/dL — ABNORMAL LOW (ref 32.0–36.0)
MCV: 87.6 fL (ref 79.3–98.0)
MONO#: 0.3 10*3/uL (ref 0.1–0.9)
MONO%: 4 % (ref 0.0–14.0)
NEUT%: 82.4 % — ABNORMAL HIGH (ref 39.0–75.0)
NEUTROS ABS: 6.4 10*3/uL (ref 1.5–6.5)
Platelets: 128 10*3/uL — ABNORMAL LOW (ref 140–400)
RBC: 4.33 10*6/uL (ref 4.20–5.82)
RDW: 18.2 % — AB (ref 11.0–14.6)
WBC: 7.7 10*3/uL (ref 4.0–10.3)

## 2014-09-28 LAB — LACTATE DEHYDROGENASE (CC13): LDH: 597 U/L — ABNORMAL HIGH (ref 125–245)

## 2014-09-28 MED ORDER — LORAZEPAM 0.5 MG PO TABS: 0.5000 mg | ORAL_TABLET | Freq: Every day | ORAL | Status: DC

## 2014-09-28 MED ORDER — PREDNISONE 5 MG PO TABS: 5.0000 mg | ORAL_TABLET | Freq: Two times a day (BID) | ORAL | Status: DC

## 2014-09-28 NOTE — Telephone Encounter (Signed)
Prednisone called in

## 2014-09-28 NOTE — Progress Notes (Signed)
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 Starting from today his dose will be changed to 10 mg by mouth daily for 7 days followed by 5 mg by mouth daily for another 7 days.  INTERVAL HISTORY: Travis Mcdaniel 44 y.o. male returns to the clinic today for follow-up visit. The patient continues to feel better and no significant complaints today. He works full-time. He denied having any significant chest pain, shortness of breath, cough or hemoptysis. He denied having any significant fever or chills, no nausea or vomiting. No significant weight loss or night sweats. He has no bleeding issues. He has repeat CBC and comprehensive metabolic panel performed earlier today and he is here for evaluation and discussion of his lab results and recommendation regarding treatment of his condition.  MEDICAL HISTORY: Past Medical History  Diagnosis Date  . Diverticulosis     ALLERGIES:  has No Known Allergies.  MEDICATIONS:  Current Outpatient Prescriptions  Medication Sig Dispense Refill  . Aspirin-Salicylamide-Caffeine (BC HEADACHE POWDER PO) Take 1 packet by mouth daily as needed (pain.).    [name] Kitchen celecoxib (CELEBREX) 100 MG capsule Take 100-200 mg by mouth daily as needed for mild pain.    [name] Kitchen LORazepam (ATIVAN) 0.5 MG tablet Take 1 tablet (0.5 mg total) by mouth every 8 (eight) hours. 18 tablet 0  . Multiple Vitamin (MULTIVITAMIN WITH MINERALS) TABS tablet Take 1 tablet by mouth daily.    . predniSONE (DELTASONE) 20 MG tablet Take 1 tablet (20 mg total) by mouth 2 (two) times daily. Take 1 tablet in am and 1 tablet in pm x 7 days then take 1 tablet daily for 7 days 21 tablet 0   No current facility-administered  medications for this visit.    SURGICAL HISTORY: No past surgical history on file.  REVIEW OF SYSTEMS:  Constitutional: negative Eyes: negative Ears, nose, mouth, throat, and face: negative Respiratory: negative Cardiovascular: negative Gastrointestinal: negative Genitourinary:negative Integument/breast: negative Hematologic/lymphatic: negative Musculoskeletal:negative Neurological: negative Behavioral/Psych: negative Endocrine: negative Allergic/Immunologic: negative   PHYSICAL EXAMINATION: General appearance: alert, cooperative and no distress Head: Normocephalic, without obvious abnormality, atraumatic Neck: no adenopathy, no JVD, supple, symmetrical, trachea midline and thyroid not enlarged, symmetric, no tenderness/mass/nodules Lymph nodes: Cervical, supraclavicular, and axillary nodes normal. Resp: clear to auscultation bilaterally Back: symmetric, no curvature. ROM normal. No CVA tenderness. Cardio: regular rate and rhythm, S1, S2 normal, no murmur, click, rub or gallop GI: soft, non-tender; bowel sounds normal; no masses,  no organomegaly Extremities: extremities normal, atraumatic, no cyanosis or edema Neurologic: Alert and oriented X 3, normal strength and tone. Normal symmetric reflexes. Normal coordination and gait  ECOG PERFORMANCE STATUS: 0 - Asymptomatic  Blood pressure 159/77, pulse 65, temperature 98.2 F (36.8 C), temperature source Oral, resp. rate 18, height 5\' 6"  (1.676 m), weight 155 lb 9.6 oz (70.58 kg), SpO2 100 %.  LABORATORY DATA: Lab Results  Component Value Date   WBC 7.7 09/28/2014   HGB 11.8* 09/28/2014   HCT 37.9* 09/28/2014   MCV 87.6 09/28/2014   PLT 128* 09/28/2014      Chemistry      Component Value Date/Time   NA 142 09/14/2014 0814   NA 140 09/07/2014 0407  K 4.1 09/14/2014 0814   K 3.9 09/07/2014 0407   CL 110 09/07/2014 0407   CO2 25 09/14/2014 0814   CO2 22 09/07/2014 0407   BUN 16.4 09/14/2014 0814   BUN 12 09/07/2014  0407   CREATININE 0.9 09/14/2014 0814   CREATININE 0.99 09/07/2014 0407      Component Value Date/Time   CALCIUM 9.1 09/14/2014 0814   CALCIUM 8.3* 09/07/2014 0407   ALKPHOS 44 09/14/2014 0814   ALKPHOS 30* 09/07/2014 0407   AST 24 09/14/2014 0814   AST 120* 09/07/2014 0407   ALT 18 09/14/2014 0814   ALT 27 09/07/2014 0407   BILITOT 0.96 09/14/2014 0814   BILITOT 0.7 09/07/2014 0407       RADIOGRAPHIC STUDIES: Ct Abdomen Pelvis Wo Contrast  09/05/2014   CLINICAL DATA:  I fatigue, nausea, vomiting, and diarrhea for 2 days. Of gross hematuria onset today. No IV contrast material due to acute renal injury.  EXAM: CT ABDOMEN AND PELVIS WITHOUT CONTRAST  TECHNIQUE: Multidetector CT imaging of the abdomen and pelvis was performed following the standard protocol without IV contrast.  COMPARISON:  01/16/2005  FINDINGS: Lung bases are clear, allowing for motion artifact.  Evaluation of solid organs and vascular structures is limited without IV contrast material. Scattered subcentimeter low-attenuation lesions in the liver are too small to characterize but likely represent small cysts. The unenhanced appearance of the gallbladder, spleen, pancreas, adrenal glands, kidneys, inferior vena cava, and retroperitoneal lymph nodes is unremarkable. Calcification of aorta without aneurysm. Mesenteric and celiac axis lymph nodes are present without pathologic enlargement, likely reactive. Stomach, small bowel, and colon are not abnormally distended. Contrast material flows through to the colon without evidence of bowel obstruction. No bowel wall thickening is appreciated. No free air or free fluid in the abdomen. Abdominal wall musculature appears intact.  Pelvis: Diverticulosis of the sigmoid colon without evidence of diverticulitis. Appendix is normal. Bladder wall is not thickened. Prostate gland is not enlarged. No free or loculated pelvic fluid collections. No pelvic mass or lymphadenopathy. Degenerative  changes in the spine. No destructive bone lesions.  IMPRESSION: No acute process demonstrated in the abdomen or pelvis on noncontrast imaging. No evidence of bowel obstruction or significant bowel wall thickening.   Electronically Signed   By: Lucienne Capers M.D.   On: 09/05/2014 19:27    ASSESSMENT AND PLAN: This is a very pleasant 44 years old white male recently diagnosed with hemolytic anemia secondary to viral infection with influenza A and H1N1.  His hemoglobin and hematocrit continues to improve. He has mild thrombocytopenia. I recommended for the patient to continue the taper of his prednisone and he will start 10 mg by mouth daily for 1 week followed by 5 mg by mouth daily for another week. I will see the patient back for follow-up visit in 2 weeks for reevaluation and repeat blood work. The patient was advised to call immediately if he has any concerning symptoms in the interval. The patient voices understanding of current disease status and treatment options and is in agreement with the current care plan.  All questions were answered. The patient knows to call the clinic with any problems, questions or concerns. We can certainly see the patient much sooner if necessary.  Disclaimer: This note was dictated with voice recognition software. Similar sounding words can inadvertently be transcribed and may not be corrected upon review.

## 2014-09-28 NOTE — Telephone Encounter (Signed)
Gave avs & calendar for April. °

## 2014-10-03 ENCOUNTER — Other Ambulatory Visit: Payer: 59

## 2014-10-03 ENCOUNTER — Ambulatory Visit: Payer: 59

## 2014-10-03 ENCOUNTER — Ambulatory Visit: Payer: 59 | Admitting: Internal Medicine

## 2014-10-12 ENCOUNTER — Other Ambulatory Visit (HOSPITAL_BASED_OUTPATIENT_CLINIC_OR_DEPARTMENT_OTHER): Payer: 59

## 2014-10-12 ENCOUNTER — Ambulatory Visit (HOSPITAL_BASED_OUTPATIENT_CLINIC_OR_DEPARTMENT_OTHER): Payer: 59 | Admitting: Internal Medicine

## 2014-10-12 ENCOUNTER — Encounter: Payer: Self-pay | Admitting: Internal Medicine

## 2014-10-12 VITALS — BP 156/80 | HR 72 | Temp 98.1°F | Resp 18 | Ht 66.0 in | Wt 155.6 lb

## 2014-10-12 DIAGNOSIS — D696 Thrombocytopenia, unspecified: Secondary | ICD-10-CM

## 2014-10-12 DIAGNOSIS — D589 Hereditary hemolytic anemia, unspecified: Secondary | ICD-10-CM

## 2014-10-12 DIAGNOSIS — D599 Acquired hemolytic anemia, unspecified: Secondary | ICD-10-CM

## 2014-10-12 LAB — COMPREHENSIVE METABOLIC PANEL (CC13)
ALBUMIN: 4.1 g/dL (ref 3.5–5.0)
ALT: 24 U/L (ref 0–55)
ANION GAP: 13 meq/L — AB (ref 3–11)
AST: 73 U/L — ABNORMAL HIGH (ref 5–34)
Alkaline Phosphatase: 65 U/L (ref 40–150)
BUN: 14.3 mg/dL (ref 7.0–26.0)
CALCIUM: 7.6 mg/dL — AB (ref 8.4–10.4)
CHLORIDE: 106 meq/L (ref 98–109)
CO2: 24 mEq/L (ref 22–29)
Creatinine: 0.8 mg/dL (ref 0.7–1.3)
EGFR: >90 mL/min/{1.73_m2} (ref >90)
Glucose: 103 mg/dl (ref 70–140)
POTASSIUM: 3.9 meq/L (ref 3.5–5.1)
SODIUM: 143 meq/L (ref 136–145)
Total Bilirubin: 1.94 mg/dL — ABNORMAL HIGH (ref 0.20–1.20)
Total Protein: 6.9 g/dL (ref 6.4–8.3)

## 2014-10-12 LAB — CBC WITH DIFFERENTIAL/PLATELET
BASO%: 0.3 % (ref 0.0–2.0)
Basophils Absolute: 0 10*3/uL (ref 0.0–0.1)
EOS%: 1 % (ref 0.0–7.0)
Eosinophils Absolute: 0.1 10*3/uL (ref 0.0–0.5)
HCT: 39.9 % (ref 38.4–49.9)
HGB: 12.5 g/dL — ABNORMAL LOW (ref 13.0–17.1)
LYMPH%: 17.2 % (ref 14.0–49.0)
MCH: 28.7 pg (ref 27.2–33.4)
MCHC: 31.3 g/dL — ABNORMAL LOW (ref 32.0–36.0)
MCV: 91.7 fL (ref 79.3–98.0)
MONO#: 0.5 10*3/uL (ref 0.1–0.9)
MONO%: 7.8 % (ref 0.0–14.0)
NEUT#: 4.4 10*3/uL (ref 1.5–6.5)
NEUT%: 73.7 % (ref 39.0–75.0)
Platelets: 141 10*3/uL (ref 140–400)
RBC: 4.35 10*6/uL (ref 4.20–5.82)
RDW: 17.8 % — ABNORMAL HIGH (ref 11.0–14.6)
WBC: 6 10*3/uL (ref 4.0–10.3)
lymph#: 1 10*3/uL (ref 0.9–3.3)

## 2014-10-12 LAB — LACTATE DEHYDROGENASE (CC13)

## 2014-10-12 NOTE — Progress Notes (Signed)
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 This is now completely resolved  PRIOR THERAPY: Tapered dose of prednisone lately with 5 mg by mouth daily completed today.  CURRENT THERAPY: None.  INTERVAL HISTORY: Travis Mcdaniel 44 y.o. male returns to the clinic today for follow-up visit. The patient continues to feel better and no significant complaints today. He completed the taper dose of prednisone currently on 5 mg by mouth daily, last dose today. He denied having any significant chest pain, shortness of breath, cough or hemoptysis. He denied having any significant fever or chills, no nausea or vomiting. No significant weight loss or night sweats. He has no bleeding issues. He has repeat CBC and comprehensive metabolic panel performed earlier today and he is here for evaluation and discussion of his lab results and recommendation regarding treatment of his condition.  MEDICAL HISTORY: Past Medical History  Diagnosis Date  . Diverticulosis     ALLERGIES:  has No Known Allergies.  MEDICATIONS:  Current Outpatient Prescriptions  Medication Sig Dispense Refill  . Multiple Vitamin (MULTIVITAMIN WITH MINERALS) TABS tablet Take 1 tablet by mouth daily.    . predniSONE (DELTASONE) 5 MG tablet Take 1 tablet (5 mg total) by mouth 2 (two) times daily. Take 1 tablet bid x 7 days then take 1 tablet daily for 7 days 21 tablet 0   No current facility-administered medications for this visit.    SURGICAL HISTORY: No past surgical history on file.  REVIEW OF SYSTEMS:  Constitutional: negative Eyes: negative Ears, nose, mouth, throat, and face: negative Respiratory: negative Cardiovascular: negative Gastrointestinal: negative Genitourinary:negative Integument/breast: negative Hematologic/lymphatic:  negative Musculoskeletal:negative Neurological: negative Behavioral/Psych: negative Endocrine: negative Allergic/Immunologic: negative   PHYSICAL EXAMINATION: General appearance: alert, cooperative and no distress Head: Normocephalic, without obvious abnormality, atraumatic Neck: no adenopathy, no JVD, supple, symmetrical, trachea midline and thyroid not enlarged, symmetric, no tenderness/mass/nodules Lymph nodes: Cervical, supraclavicular, and axillary nodes normal. Resp: clear to auscultation bilaterally Back: symmetric, no curvature. ROM normal. No CVA tenderness. Cardio: regular rate and rhythm, S1, S2 normal, no murmur, click, rub or gallop GI: soft, non-tender; bowel sounds normal; no masses,  no organomegaly Extremities: extremities normal, atraumatic, no cyanosis or edema Neurologic: Alert and oriented X 3, normal strength and tone. Normal symmetric reflexes. Normal coordination and gait  ECOG PERFORMANCE STATUS: 0 - Asymptomatic  There were no vitals taken for this visit.  LABORATORY DATA: Lab Results  Component Value Date   WBC 6.0 10/12/2014   HGB 12.5* 10/12/2014   HCT 39.9 10/12/2014   MCV 91.7 10/12/2014   PLT 141 10/12/2014      Chemistry      Component Value Date/Time   NA 142 09/28/2014 0911   NA 140 09/07/2014 0407   K 4.1 09/28/2014 0911   K 3.9 09/07/2014 0407   CL 110 09/07/2014 0407   CO2 24 09/28/2014 0911   CO2 22 09/07/2014 0407   BUN 14.3 09/28/2014 0911   BUN 12 09/07/2014 0407   CREATININE 0.9 09/28/2014 0911   CREATININE 0.99 09/07/2014 0407      Component Value Date/Time   CALCIUM 9.2 09/28/2014 0911   CALCIUM 8.3* 09/07/2014 0407   ALKPHOS 58 09/28/2014 0911   ALKPHOS 30* 09/07/2014 0407   AST 36* 09/28/2014 0911   AST 120* 09/07/2014 0407  ALT 22 09/28/2014 0911   ALT 27 09/07/2014 0407   BILITOT 1.10 09/28/2014 0911   BILITOT 0.7 09/07/2014 0407       RADIOGRAPHIC STUDIES: No results found.  ASSESSMENT AND PLAN: This  is a very pleasant 44 years old white male recently diagnosed with hemolytic anemia secondary to viral infection with influenza A and H1N1.  His CBC today is unremarkable except for mild anemia but his hemoglobin and hematocrit had improved significantly compared to his initial presentation. Thrombocytopenia resolved. The patient completed the taper dose of prednisone. I recommended for him to continue on observation and to establish care with a primary care physician for future follow-up visit and health maintenance. I don't see a need to see the patient at regular basis unless he has any concerning hematologic or oncologic issues. The patient was advised to call immediately if he has any concerning symptoms. The patient voices understanding of current disease status and treatment options and is in agreement with the current care plan.  All questions were answered. The patient knows to call the clinic with any problems, questions or concerns. We can certainly see the patient much sooner if necessary.  Disclaimer: This note was dictated with voice recognition software. Similar sounding words can inadvertently be transcribed and may not be corrected upon review.

## 2014-10-18 ENCOUNTER — Ambulatory Visit (HOSPITAL_BASED_OUTPATIENT_CLINIC_OR_DEPARTMENT_OTHER): Payer: 59 | Admitting: Nurse Practitioner

## 2014-10-18 ENCOUNTER — Encounter: Payer: 59 | Admitting: Nurse Practitioner

## 2014-10-18 ENCOUNTER — Encounter: Payer: Self-pay | Admitting: Nurse Practitioner

## 2014-10-18 ENCOUNTER — Other Ambulatory Visit (HOSPITAL_BASED_OUTPATIENT_CLINIC_OR_DEPARTMENT_OTHER): Payer: 59

## 2014-10-18 ENCOUNTER — Telehealth: Payer: Self-pay | Admitting: *Deleted

## 2014-10-18 ENCOUNTER — Other Ambulatory Visit: Payer: 59

## 2014-10-18 ENCOUNTER — Other Ambulatory Visit: Payer: Self-pay | Admitting: *Deleted

## 2014-10-18 VITALS — BP 177/85 | HR 99 | Temp 98.4°F | Resp 18 | Wt 155.6 lb

## 2014-10-18 DIAGNOSIS — R319 Hematuria, unspecified: Secondary | ICD-10-CM

## 2014-10-18 DIAGNOSIS — D696 Thrombocytopenia, unspecified: Secondary | ICD-10-CM

## 2014-10-18 DIAGNOSIS — D589 Hereditary hemolytic anemia, unspecified: Secondary | ICD-10-CM

## 2014-10-18 DIAGNOSIS — F1010 Alcohol abuse, uncomplicated: Secondary | ICD-10-CM | POA: Diagnosis not present

## 2014-10-18 DIAGNOSIS — D599 Acquired hemolytic anemia, unspecified: Secondary | ICD-10-CM | POA: Diagnosis not present

## 2014-10-18 DIAGNOSIS — R310 Gross hematuria: Secondary | ICD-10-CM | POA: Diagnosis not present

## 2014-10-18 DIAGNOSIS — R300 Dysuria: Secondary | ICD-10-CM

## 2014-10-18 DIAGNOSIS — R740 Nonspecific elevation of levels of transaminase and lactic acid dehydrogenase [LDH]: Secondary | ICD-10-CM

## 2014-10-18 DIAGNOSIS — F1029 Alcohol dependence with unspecified alcohol-induced disorder: Secondary | ICD-10-CM

## 2014-10-18 DIAGNOSIS — F419 Anxiety disorder, unspecified: Secondary | ICD-10-CM

## 2014-10-18 DIAGNOSIS — R7401 Elevation of levels of liver transaminase levels: Secondary | ICD-10-CM

## 2014-10-18 LAB — COMPREHENSIVE METABOLIC PANEL (CC13)
ALBUMIN: 3.9 g/dL (ref 3.5–5.0)
ALK PHOS: 74 U/L (ref 40–150)
ALT: 31 U/L (ref 0–55)
AST: 161 U/L — AB (ref 5–34)
Anion Gap: 16 mEq/L — ABNORMAL HIGH (ref 3–11)
BILIRUBIN TOTAL: 3.08 mg/dL — AB (ref 0.20–1.20)
BUN: 8.8 mg/dL (ref 7.0–26.0)
CO2: 20 mEq/L — ABNORMAL LOW (ref 22–29)
Calcium: 8.9 mg/dL (ref 8.4–10.4)
Chloride: 106 mEq/L (ref 98–109)
Creatinine: 0.8 mg/dL (ref 0.7–1.3)
EGFR: >90 mL/min/{1.73_m2} (ref >90)
Glucose: 109 mg/dl (ref 70–140)
POTASSIUM: 3.8 meq/L (ref 3.5–5.1)
Sodium: 142 mEq/L (ref 136–145)
Total Protein: 6.8 g/dL (ref 6.4–8.3)

## 2014-10-18 LAB — CBC WITH DIFFERENTIAL/PLATELET
BASO%: 0.8 % (ref 0.0–2.0)
BASOS ABS: 0.1 10*3/uL (ref 0.0–0.1)
EOS%: 0.5 % (ref 0.0–7.0)
Eosinophils Absolute: 0 10*3/uL (ref 0.0–0.5)
HEMATOCRIT: 35.9 % — AB (ref 38.4–49.9)
HGB: 11.3 g/dL — ABNORMAL LOW (ref 13.0–17.1)
LYMPH%: 18.7 % (ref 14.0–49.0)
MCH: 28.6 pg (ref 27.2–33.4)
MCHC: 31.4 g/dL — ABNORMAL LOW (ref 32.0–36.0)
MCV: 91.1 fL (ref 79.3–98.0)
MONO#: 0.5 10*3/uL (ref 0.1–0.9)
MONO%: 7.4 % (ref 0.0–14.0)
NEUT#: 4.7 10*3/uL (ref 1.5–6.5)
NEUT%: 72.6 % (ref 39.0–75.0)
PLATELETS: 129 10*3/uL — AB (ref 140–400)
RBC: 3.93 10*6/uL — ABNORMAL LOW (ref 4.20–5.82)
RDW: 20.1 % — ABNORMAL HIGH (ref 11.0–14.6)
WBC: 6.5 10*3/uL (ref 4.0–10.3)
lymph#: 1.2 10*3/uL (ref 0.9–3.3)

## 2014-10-18 LAB — URINALYSIS, MICROSCOPIC - CHCC
Bilirubin (Urine): NEGATIVE
Glucose: NEGATIVE mg/dL
KETONES: NEGATIVE mg/dL
LEUKOCYTE ESTERASE: NEGATIVE
NITRITE: NEGATIVE
PROTEIN: NEGATIVE mg/dL
Specific Gravity, Urine: 1.005 (ref 1.003–1.035)
Urobilinogen, UR: 0.2 mg/dL (ref 0.2–1)
pH: 5 (ref 4.6–8.0)

## 2014-10-18 NOTE — Assessment & Plan Note (Addendum)
Patient reports that he used to take Xanax in the past; approximately 5 years ago for chronic anxiety issues.  He has been taken 1-2 Ativan tablets each day to help him with his anxiety recently.  Patient states that he is out of the Ativan; is requesting a refill.  Advised patient he would need to obtain a primary care physician to manage his chronic anxiety issues.  Patient stated that he prefers to hold on obtaining a primary care physician; stating that he has too many health care-related bills already.  He stated he would buy Ativan at the bar from someone instead.

## 2014-10-18 NOTE — Assessment & Plan Note (Signed)
Patient has a history of hyperbilirubinemia and transaminitis in the past.  Patient does admit to drinking fairly heavy this past weekend.  AST has increased from 73-161.  ALT has slightly increased from 24 up to 31.  On exam-abdomen soft, bowels as possible in all 4 quads, and nontender.  Patient was advised to minimize all alcohol intake to see if this helps with his elevated liver enzymes.

## 2014-10-18 NOTE — Telephone Encounter (Signed)
VM message from patient. Call back to [phone]. Spoke with patient. He states when he got up this morning to void he noted that his urine was red with blood in it and that he had an upset stomach. He states he just completed prednisone and is wondering if he needs to go back on it. Denies fever. Discussed with Dr. Julien Nordmann. Ok to come for labs, u/a and to see Selena Lesser, NP in [facility].   Call made back to patient for appt in lab @ 1:45pm and to see Selena Lesser, NP @ 2pm

## 2014-10-18 NOTE — Progress Notes (Signed)
[facility]   HPI: Travis Mcdaniel 44 y.o. male diagnosed with hemolytic anemia following diagnosis of influenza A and H1 N1.  Recently completed a prednisone taper.  Patient completed his prednisone taper on Friday, 10/12/2014.  Patient feels that he has recovered well since diagnosis of both influenza A and H1 N1.  Patient developed some gross hematuria just yesterday morning.  Hemoglobin has dropped and 6 days from 12.5 down to 11.3.  Patient denies any worsening issues with fatigue or shortness of breath with exertion.  Patient reports that he used to take Xanax in the past; approximately 5 years ago for chronic anxiety issues.  He has been taken 1-2 Ativan tablets each day to help him with his anxiety recently.  Patient states that he is out of the Ativan; is requesting a refill.  Pt reports drinking fairly heavy this weekend in "celebration of recovering from his recent illness".   HPI  ROS  Past Medical History  Diagnosis Date  . Diverticulosis     History reviewed. No pertinent past surgical history.  has Hemolytic anemia; Acute kidney injury; UTI (lower urinary tract infection); Transaminitis; Hematuria; Alcohol dependence; Diarrhea; Absolute anemia; Influenza A (H1N1); Thrombocytopenia; and Anxiety on his problem list.    has No Known Allergies.    Medication List       This list is accurate as of: 10/18/14  4:05 PM.  Always use your most recent med list.               multivitamin with minerals Tabs tablet  Take 1 tablet by mouth daily.         PHYSICAL EXAMINATION  Oncology Vitals 10/18/2014 10/12/2014 09/28/2014 09/14/2014 09/07/2014 09/07/2014 09/07/2014  Height - 168 cm 168 cm 168 cm - - -  Weight 70.58 kg 70.58 kg 70.58 kg 69.491 kg - - -  Weight (lbs) 155 lbs 10 oz 155 lbs 10 oz 155 lbs 10 oz 153 lbs 3 oz - - -  BMI (kg/m2) - 25.11 kg/m2 25.11 kg/m2 24.73 kg/m2 - - -  Temp 98.4 98.1 98.2 98.4 - 98.5 -  Pulse 99 72 65 56 80 - 67  Resp  _0 - 16  SpO2 100 100 100 100 100 - 100  BSA (m2) - 1.81 m2 1.81 m2 1.8 m2 - - -   BP Readings from Last 3 Encounters:  10/18/14 177/85  10/12/14 156/80  09/28/14 159/77    Physical Exam  Constitutional: He is oriented to person, place, and time and well-developed, well-nourished, and in no distress.  HENT:  Head: Normocephalic and atraumatic.  Mouth/Throat: Oropharynx is clear and moist.  Eyes: Conjunctivae and EOM are normal. Pupils are equal, round, and reactive to light.  Neck: Normal range of motion. Neck supple. No JVD present. No tracheal deviation present. No thyromegaly present.  Cardiovascular: Normal rate, regular rhythm, normal heart sounds and intact distal pulses.   Pulmonary/Chest: Effort normal and breath sounds normal. No respiratory distress. He has no wheezes. He has no rales. He exhibits no tenderness.  Abdominal: Soft. Bowel sounds are normal. He exhibits no distension and no mass. There is no tenderness. There is no rebound and no guarding.  Musculoskeletal: Normal range of motion. He exhibits no edema or tenderness.  Lymphadenopathy:    He has no cervical adenopathy.  Neurological: He is alert and oriented to person, place, and time. Gait normal.  Skin: Skin is warm and dry. No rash noted.  No erythema. No pallor.  Psychiatric: Affect normal.  Nursing note and vitals reviewed.   LABORATORY DATA:. Appointment on 10/18/2014  Component Date Value Ref Range Status  . Sodium 10/18/2014 142  136 - 145 mEq/L Final  . Potassium 10/18/2014 3.8  3.5 - 5.1 mEq/L Final  . Chloride 10/18/2014 106  98 - 109 mEq/L Final  . CO2 10/18/2014 20* 22 - 29 mEq/L Final  . Glucose 10/18/2014 109  70 - 140 mg/dl Final  . BUN 10/18/2014 8.8  7.0 - 26.0 mg/dL Final  . Creatinine 10/18/2014 0.8  0.7 - 1.3 mg/dL Final  . Total Bilirubin 10/18/2014 3.08* 0.20 - 1.20 mg/dL Final  . Alkaline Phosphatase 10/18/2014 74  40 - 150 U/L Final  . AST 10/18/2014 161* 5 - 34 U/L  Final  . ALT 10/18/2014 31  0 - 55 U/L Final  . Total Protein 10/18/2014 6.8  6.4 - 8.3 g/dL Final  . Albumin 10/18/2014 3.9  3.5 - 5.0 g/dL Final  . Calcium 10/18/2014 8.9  8.4 - 10.4 mg/dL Final  . Anion Gap 10/18/2014 16* 3 - 11 mEq/L Final  . EGFR 10/18/2014 >90  >90 ml/min/1.73 m2 Final   eGFR is calculated using the CKD-EPI Creatinine Equation (2009)  . WBC 10/18/2014 6.5  4.0 - 10.3 10e3/uL Final  . NEUT# 10/18/2014 4.7  1.5 - 6.5 10e3/uL Final  . HGB 10/18/2014 11.3* 13.0 - 17.1 g/dL Final  . HCT 10/18/2014 35.9* 38.4 - 49.9 % Final  . Platelets 10/18/2014 129* 140 - 400 10e3/uL Final  . MCV 10/18/2014 91.1  79.3 - 98.0 fL Final  . MCH 10/18/2014 28.6  27.2 - 33.4 pg Final  . MCHC 10/18/2014 31.4* 32.0 - 36.0 g/dL Final  . RBC 10/18/2014 3.93* 4.20 - 5.82 10e6/uL Final  . RDW 10/18/2014 20.1* 11.0 - 14.6 % Final  . lymph# 10/18/2014 1.2  0.9 - 3.3 10e3/uL Final  . MONO# 10/18/2014 0.5  0.1 - 0.9 10e3/uL Final  . Eosinophils Absolute 10/18/2014 0.0  0.0 - 0.5 10e3/uL Final  . Basophils Absolute 10/18/2014 0.1  0.0 - 0.1 10e3/uL Final  . NEUT% 10/18/2014 72.6  39.0 - 75.0 % Final  . LYMPH% 10/18/2014 18.7  14.0 - 49.0 % Final  . MONO% 10/18/2014 7.4  0.0 - 14.0 % Final  . EOS% 10/18/2014 0.5  0.0 - 7.0 % Final  . BASO% 10/18/2014 0.8  0.0 - 2.0 % Final  . Glucose 10/18/2014 Negative  Negative mg/dL Final  . Bilirubin (Urine) 10/18/2014 Negative  Negative Final  . Ketones 10/18/2014 Negative  Negative mg/dL Final  . Specific Gravity, Urine 10/18/2014 1.005  1.003 - 1.035 Final  . Blood 10/18/2014 Large  Negative Final  . pH 10/18/2014 5.0  4.6 - 8.0 Final  . Protein 10/18/2014 Negative  Negative- <30 mg/dL Final  . Urobilinogen, UR 10/18/2014 0.2  0.2 - 1 mg/dL Final  . Nitrite 10/18/2014 Negative  Negative Final  . Leukocyte Esterase 10/18/2014 Negative  Negative Final  . RBC / HPF 10/18/2014 3-6  0 - 2 Final  . WBC, UA 10/18/2014 3-6  0 - 2 Final  . Bacteria, UA  10/18/2014 Few  Negative- Trace Final     RADIOGRAPHIC STUDIES: No results found.  ASSESSMENT/PLAN:    Hemolytic anemia Patient completed his prednisone taper on Friday, 10/12/2014.  Patient feels that he has recovered well since diagnosis of both influenza A and H1 N1.  Patient developed some gross hematuria just yesterday morning.  Hemoglobin has dropped and 6 days from 12.5 down to 11.3.  Patient denies any worsening issues with fatigue or shortness of breath with exertion.  After consult with Dr. Vassie Moselle patient he would need a urology consult for further evaluation and management is hematuria continues.  Was able to obtain a new patient appointment at [facility] for 11/16/2014 at 1:45 PM.  Was also excised per [facility] the patient may very well be able to be added to the urology urgent care clinic once notes from today's visit are faxed to them.  Patient was advised to obtain a primary care physician; and needs no further follow-up at  the [facility] unless he develops new symptoms.     Transaminitis Patient has a history of hyperbilirubinemia and transaminitis in the past.  Patient does admit to drinking fairly heavy this past weekend.  AST has increased from 73-161.  ALT has slightly increased from 24 up to 31.  On exam-abdomen soft, bowels as possible in all 4 quads, and nontender.  Patient was advised to minimize all alcohol intake to see if this helps with his elevated liver enzymes.   Hematuria Patient developed some gross hematuria just yesterday morning.  Hemoglobin has dropped and 6 days from 12.5 down to 11.3.  Patient denies any worsening issues with fatigue or shortness of breath with exertion.  After consult with Dr. Vassie Moselle patient he would need a urology consult for further evaluation and management is hematuria continues.  Was able to obtain a new patient appointment at [facility] for 11/16/2014 at 1:45 PM.  Was also advised   per [facility] the patient may very well be able to be added to the urology urgent care clinic once notes from today's visit are faxed to them.  Patient was advised he would need to call [facility] office with further insurance information 1-2 weeks prior to his appointment; just in case he needs referral paperwork filled out.  Today's visit notes will be faxed to [facility] per their request.       Alcohol dependence Patient admits to drinking fairly heavily this weekend in celebration of his recovery from influenza A and H1 N1.  Both patient's liver enzymes and bilirubin are elevated today.  Patient denies any abdominal discomfort whatsoever.  Patient was advised to minimize/discontinued alcohol intake if at all possible.   Thrombocytopenia Platelet count has decreased from 141 down to 129.  Patient is noted with gross hematuria within the past 24 hours.  Have arranged for a urologist consult on 11/16/2014.     Anxiety Patient reports that he used to take Xanax in the past; approximately 5 years ago for chronic anxiety issues.  He has been taken 1-2 Ativan tablets each day to help him with his anxiety recently.  Patient states that he is out of the Ativan; is requesting a refill.  Advised patient he would need to obtain a primary care physician to manage his chronic anxiety issues.  Patient stated that he prefers to hold on obtaining a primary care physician; stating that he has too many health care-related bills already.  He stated he would buy Ativan at the bar from someone instead.   Patient stated understanding of all instructions; and was in agreement with this plan of care. The patient knows to call the clinic with any problems, questions or concerns.   Review/collaboration with Dr. Julien Nordmann regarding all aspects of patient's visit today.   Total time spent with patient was 25 minutes;  with greater than 75 percent of that time spent in face to face  counseling regarding patient's symptoms,  and coordination of care and follow up.  Disclaimer: This note was dictated with voice recognition software. Similar sounding words can inadvertently be transcribed and may not be corrected upon review.   Drue Second, NP 10/18/2014

## 2014-10-18 NOTE — Assessment & Plan Note (Signed)
Platelet count has decreased from 141 down to 129.  Patient is noted with gross hematuria within the past 24 hours.  Have arranged for a urologist consult on 11/16/2014.

## 2014-10-18 NOTE — Assessment & Plan Note (Signed)
Patient completed his prednisone taper on Friday, 10/12/2014.  Patient feels that he has recovered well since diagnosis of both influenza A and H1 N1.  Patient developed some gross hematuria just yesterday morning.  Hemoglobin has dropped and 6 days from 12.5 down to 11.3.  Patient denies any worsening issues with fatigue or shortness of breath with exertion.  After consult with Dr. Vassie Moselle patient he would need a urology consult for further evaluation and management is hematuria continues.  Was able to obtain a new patient appointment at [facility] for 11/16/2014 at 1:45 PM.  Was also excised per [facility] the patient may very well be able to be added to the urology urgent care clinic once notes from today's visit are faxed to them.  Patient was advised to obtain a primary care physician; and needs no further follow-up at  the [facility] unless he develops new symptoms.

## 2014-10-18 NOTE — Assessment & Plan Note (Signed)
Patient admits to drinking fairly heavily this weekend in celebration of his recovery from influenza A and H1 N1.  Both patient's liver enzymes and bilirubin are elevated today.  Patient denies any abdominal discomfort whatsoever.  Patient was advised to minimize/discontinued alcohol intake if at all possible.

## 2014-10-18 NOTE — Assessment & Plan Note (Addendum)
Patient developed some gross hematuria just yesterday morning.  Hemoglobin has dropped and 6 days from 12.5 down to 11.3.  Patient denies any worsening issues with fatigue or shortness of breath with exertion.  After consult with Dr. Vassie Moselle patient he would need a urology consult for further evaluation and management is hematuria continues.  Was able to obtain a new patient appointment at [facility] for 11/16/2014 at 1:45 PM.  Was also advised  per [facility] the patient may very well be able to be added to the urology urgent care clinic once notes from today's visit are faxed to them.  Patient was advised he would need to call [facility] office with further insurance information 1-2 weeks prior to his appointment; just in case he needs referral paperwork filled out.  Today's visit notes will be faxed to [facility] per their request.

## 2014-10-19 ENCOUNTER — Telehealth: Payer: Self-pay | Admitting: *Deleted

## 2014-10-19 LAB — URINE CULTURE

## 2014-10-19 NOTE — Telephone Encounter (Signed)
TC from [facility]. Follow up courtesy call. Pt.is to keep next appt with Dr. Diona Fanti in May for hematuria issues. Pt is aware of this.

## 2014-10-23 ENCOUNTER — Other Ambulatory Visit: Payer: Self-pay | Admitting: Nurse Practitioner

## 2014-10-23 ENCOUNTER — Other Ambulatory Visit (HOSPITAL_BASED_OUTPATIENT_CLINIC_OR_DEPARTMENT_OTHER): Payer: 59

## 2014-10-23 ENCOUNTER — Other Ambulatory Visit: Payer: Self-pay | Admitting: *Deleted

## 2014-10-23 ENCOUNTER — Other Ambulatory Visit: Payer: Self-pay | Admitting: Internal Medicine

## 2014-10-23 ENCOUNTER — Telehealth: Payer: Self-pay | Admitting: Internal Medicine

## 2014-10-23 ENCOUNTER — Other Ambulatory Visit: Payer: Self-pay

## 2014-10-23 ENCOUNTER — Ambulatory Visit (HOSPITAL_BASED_OUTPATIENT_CLINIC_OR_DEPARTMENT_OTHER): Payer: 59 | Admitting: Nurse Practitioner

## 2014-10-23 ENCOUNTER — Telehealth: Payer: Self-pay | Admitting: *Deleted

## 2014-10-23 VITALS — BP 185/91 | HR 78 | Temp 97.7°F | Resp 18 | Wt 152.7 lb

## 2014-10-23 DIAGNOSIS — D599 Acquired hemolytic anemia, unspecified: Secondary | ICD-10-CM

## 2014-10-23 DIAGNOSIS — R319 Hematuria, unspecified: Secondary | ICD-10-CM

## 2014-10-23 DIAGNOSIS — D696 Thrombocytopenia, unspecified: Secondary | ICD-10-CM

## 2014-10-23 DIAGNOSIS — R740 Nonspecific elevation of levels of transaminase and lactic acid dehydrogenase [LDH]: Secondary | ICD-10-CM

## 2014-10-23 DIAGNOSIS — F419 Anxiety disorder, unspecified: Secondary | ICD-10-CM

## 2014-10-23 DIAGNOSIS — F1020 Alcohol dependence, uncomplicated: Secondary | ICD-10-CM

## 2014-10-23 DIAGNOSIS — D589 Hereditary hemolytic anemia, unspecified: Secondary | ICD-10-CM

## 2014-10-23 DIAGNOSIS — L0291 Cutaneous abscess, unspecified: Secondary | ICD-10-CM

## 2014-10-23 DIAGNOSIS — R310 Gross hematuria: Secondary | ICD-10-CM | POA: Diagnosis not present

## 2014-10-23 DIAGNOSIS — R7401 Elevation of levels of liver transaminase levels: Secondary | ICD-10-CM

## 2014-10-23 DIAGNOSIS — I10 Essential (primary) hypertension: Secondary | ICD-10-CM

## 2014-10-23 LAB — URINALYSIS, MICROSCOPIC - CHCC: Glucose: NEGATIVE mg/dL

## 2014-10-23 LAB — CBC WITH DIFFERENTIAL/PLATELET
BASO%: 0.2 % (ref 0.0–2.0)
BASOS ABS: 0 10*3/uL (ref 0.0–0.1)
EOS%: 0.3 % (ref 0.0–7.0)
Eosinophils Absolute: 0 10*3/uL (ref 0.0–0.5)
HCT: 31.8 % — ABNORMAL LOW (ref 38.4–49.9)
HGB: 10.2 g/dL — ABNORMAL LOW (ref 13.0–17.1)
LYMPH%: 11.9 % — AB (ref 14.0–49.0)
MCH: 30.5 pg (ref 27.2–33.4)
MCHC: 32.1 g/dL (ref 32.0–36.0)
MCV: 95.2 fL (ref 79.3–98.0)
MONO#: 1 10*3/uL — ABNORMAL HIGH (ref 0.1–0.9)
MONO%: 8.3 % (ref 0.0–14.0)
NEUT#: 9.2 10*3/uL — ABNORMAL HIGH (ref 1.5–6.5)
NEUT%: 79.3 % — ABNORMAL HIGH (ref 39.0–75.0)
Platelets: 152 10*3/uL (ref 140–400)
RBC: 3.34 10*6/uL — ABNORMAL LOW (ref 4.20–5.82)
RDW: 22.4 % — AB (ref 11.0–14.6)
WBC: 11.6 10*3/uL — AB (ref 4.0–10.3)
lymph#: 1.4 10*3/uL (ref 0.9–3.3)

## 2014-10-23 LAB — COMPREHENSIVE METABOLIC PANEL (CC13)
ALBUMIN: 3.9 g/dL (ref 3.5–5.0)
ALT: 47 U/L (ref 0–55)
ANION GAP: 15 meq/L — AB (ref 3–11)
AST: 358 U/L — AB (ref 5–34)
Alkaline Phosphatase: 71 U/L (ref 40–150)
BUN: 12.9 mg/dL (ref 7.0–26.0)
CO2: 21 mEq/L — ABNORMAL LOW (ref 22–29)
Calcium: 9.4 mg/dL (ref 8.4–10.4)
Chloride: 104 mEq/L (ref 98–109)
Creatinine: 1 mg/dL (ref 0.7–1.3)
EGFR: 88 mL/min/{1.73_m2} — AB (ref >90)
Glucose: 108 mg/dl (ref 70–140)
POTASSIUM: 4.4 meq/L (ref 3.5–5.1)
Sodium: 140 mEq/L (ref 136–145)
Total Bilirubin: 3.17 mg/dL — ABNORMAL HIGH (ref 0.20–1.20)
Total Protein: 7.7 g/dL (ref 6.4–8.3)

## 2014-10-23 LAB — LACTATE DEHYDROGENASE (CC13): LDH: 4231 U/L — ABNORMAL HIGH (ref 125–245)

## 2014-10-23 MED ORDER — PREDNISONE 20 MG PO TABS: ORAL_TABLET | ORAL | Status: DC

## 2014-10-23 MED ORDER — DOXYCYCLINE HYCLATE 50 MG PO CAPS: 100.0000 mg | ORAL_CAPSULE | Freq: Two times a day (BID) | ORAL | Status: DC

## 2014-10-23 NOTE — Telephone Encounter (Signed)
PT.'S URINE IS DARK RED. ON 10/22/14 PT. NOTICED A CYST ON HIS LEFT BUTTOCK. IT IS SWOLLEN AND PAINFUL BUT NO DRAINAGE. VERBAL ORDER AND READ BACK TO CYNDEE BACON,NP- PT. TO COME TO THE OFFICE FOR A CBC AND VISIT. THIS NOTE ROUTED TO [place].

## 2014-10-23 NOTE — Telephone Encounter (Signed)
s.w. pt and advise don 4.27 appt.[name] KitchenMarland KitchenMarland KitchenMarland Kitchenpt ok and aware

## 2014-10-24 ENCOUNTER — Telehealth: Payer: Self-pay | Admitting: *Deleted

## 2014-10-24 ENCOUNTER — Other Ambulatory Visit: Payer: Self-pay | Admitting: *Deleted

## 2014-10-24 ENCOUNTER — Telehealth: Payer: Self-pay | Admitting: Internal Medicine

## 2014-10-24 ENCOUNTER — Telehealth: Payer: Self-pay

## 2014-10-24 ENCOUNTER — Encounter: Payer: Self-pay | Admitting: Nurse Practitioner

## 2014-10-24 ENCOUNTER — Ambulatory Visit (HOSPITAL_BASED_OUTPATIENT_CLINIC_OR_DEPARTMENT_OTHER): Payer: 59

## 2014-10-24 VITALS — BP 196/68 | HR 53 | Temp 98.0°F | Resp 16

## 2014-10-24 DIAGNOSIS — D599 Acquired hemolytic anemia, unspecified: Secondary | ICD-10-CM

## 2014-10-24 DIAGNOSIS — R319 Hematuria, unspecified: Secondary | ICD-10-CM | POA: Diagnosis not present

## 2014-10-24 DIAGNOSIS — L0291 Cutaneous abscess, unspecified: Secondary | ICD-10-CM | POA: Insufficient documentation

## 2014-10-24 DIAGNOSIS — D589 Hereditary hemolytic anemia, unspecified: Secondary | ICD-10-CM

## 2014-10-24 DIAGNOSIS — I10 Essential (primary) hypertension: Secondary | ICD-10-CM | POA: Insufficient documentation

## 2014-10-24 LAB — HAPTOGLOBIN: Haptoglobin: <15 mg/dL — ABNORMAL LOW (ref 43–212)

## 2014-10-24 LAB — URINE CULTURE

## 2014-10-24 MED ORDER — DIPHENHYDRAMINE HCL 25 MG PO CAPS
ORAL_CAPSULE | ORAL | Status: AC
  Filled 2014-10-24: qty 1

## 2014-10-24 MED ORDER — ACETAMINOPHEN 325 MG PO TABS
650.0000 mg | ORAL_TABLET | Freq: Four times a day (QID) | ORAL | Status: DC | PRN
  Administered 2014-10-24: 650 mg via ORAL

## 2014-10-24 MED ORDER — SODIUM CHLORIDE 0.9 % IV SOLN
Freq: Once | INTRAVENOUS | Status: AC
  Administered 2014-10-24: 11:00:00 via INTRAVENOUS

## 2014-10-24 MED ORDER — DIPHENHYDRAMINE HCL 25 MG PO TABS
25.0000 mg | ORAL_TABLET | Freq: Once | ORAL | Status: AC
  Administered 2014-10-24: 25 mg via ORAL
  Filled 2014-10-24: qty 1

## 2014-10-24 MED ORDER — IMMUNE GLOBULIN (HUMAN) 10 GM/100ML IV SOLN
1.0000 g/kg | Freq: Once | INTRAVENOUS | Status: AC
  Administered 2014-10-24: 70 g via INTRAVENOUS
  Filled 2014-10-24: qty 700

## 2014-10-24 MED ORDER — ACETAMINOPHEN 325 MG PO TABS
ORAL_TABLET | ORAL | Status: AC
  Filled 2014-10-24: qty 2

## 2014-10-24 NOTE — Telephone Encounter (Signed)
Spoke to pt in infusion room- Pt needs a letter to his employer for his recent office visits. Pt is very concerned over losing his job due to his medical condition. Advised pt to talk to his HR dept about FMLA. He is going to pursue this. Pt will be scheduled for an established pt visit with Selena Lesser on Tuesday and labs before.  Letter written to employer and pt given copy with appt listed.

## 2014-10-24 NOTE — Assessment & Plan Note (Signed)
Patient reports that he used to take Xanax in the past; approximately 5 years ago for chronic anxiety issues.  He has been taken 1-2 Ativan tablets each day to help him with his anxiety recently.  Patient states that he is out of the Ativan; is requesting a refill.  Advised patient he would need to obtain a primary care physician to manage his chronic anxiety issues.  Patient stated that he prefers to hold on obtaining a primary care physician; stating that he has too many health care-related bills already.  He stated he would buy Ativan at the bar from someone instead.

## 2014-10-24 NOTE — Assessment & Plan Note (Signed)
Patient completed his prednisone taper on Friday, 10/12/2014.  Patient feels that he has recovered well since diagnosis of both influenza A and H1 N1.  Patient developed recurring hematuria last week.  He states that the hematuria has progressed from only happening once per day first thing in the morning; to hematuria all day now.  He states that his urine is a dark, rusty red/brown color now.  He denies any dysuria, frequency, or foul odor to his urine.  Hemoglobin has dropped from 12.5 down to 11.3; and now currently at 10.2.  Patient denies any worsening issues with fatigue or shortness of breath with exertion.  Most likely, patient's worsening hematuria and elevated liver and bilirubin is secondary to recurrent  hemolytic anemia.  Patient will reinitiate Prednisone 80 mg per day for a total of one week.  He will also return to the [facility] tomorrow 10/24/2014 for an IVIG infusion.  The plan is for the patient to return to the [facility] for repeat labs and follow up visit next Tuesday, 10/30/2014  Also, patient was advised that he needs to arrange for a primary care provider as well.  Was able to obtain a new patient appointment at [facility] for 11/16/2014 at 1:45 PM.  Was also advised per [facility] the patient may very well be able to be added to the urology urgent care clinic once notes from today's visit are faxed to them.

## 2014-10-24 NOTE — Telephone Encounter (Signed)
Pt called with questions about his IVIG treatment, if it was OK for him to return to work and also about his prednisone. Routed to cyndee bacon and [employer]

## 2014-10-24 NOTE — Telephone Encounter (Signed)
added appt per Posey Boyer....she will advised pt on appt

## 2014-10-24 NOTE — Telephone Encounter (Signed)
rtn call to pt- He was concerned about returning to work- pt states he has to go back to work. Advised pt that he is able to go back to work. He wanted to make sure that the prednisone was to be taken in the morning. Advised pt that the best time to take it would be the morning to avoid the adverse effect of keeping him awake in the night. Pt understands. Pt advised to call office if blood in urine begins to worsen or any other concerns arise. Pt acknowledges and understands.

## 2014-10-24 NOTE — Assessment & Plan Note (Signed)
Patient has a history of hyperbilirubinemia and transaminitis in the past.  Patient does admit to drinking fairly heavy this past weekend.  AST has increased from 161 up to 358.  ALT has increased from 31 up to 47.  On exam-abdomen soft, bowels as possible in all 4 quads, and nontender.  Bilateral sclera do appear jaundiced today.  Patient was advised to minimize all alcohol intake to see if this helps with his elevated liver enzymes.

## 2014-10-24 NOTE — Patient Instructions (Signed)

## 2014-10-24 NOTE — Assessment & Plan Note (Signed)
Patient now reports that his hematuria has progressed from only occurring first thing in the morning to occurring all day.  Also, the color his urine has changed to a dark, rusty red/brown color now.  Patient denies any dysuria, frequency, or foul odor to his urine.  Hemoglobin has dropped further today from 11.3 down to 10.2.  Platelet count has stabilized.  Most  Patient denies any worsening issues with fatigue or shortness of breath with exertion.  Most likely, increased hematuria is secondary to recurrence of hemolytic anemia.  The plan is for the patient to initiate high-dose prednisone for treatment of recurring hemolytic anemia.  Was able to obtain a new patient appointment at [facility] for 11/16/2014 at 1:45 PM.  Was also advised  per [facility] the patient may very well be able to be added to the urology urgent care clinic once notes from today's visit are faxed to them.  Patient was advised he would need to call [facility] office with further insurance information 1-2 weeks prior to his appointment; just in case he needs referral paperwork filled out.  Today's visit notes will be faxed to [facility] per their request.

## 2014-10-24 NOTE — Progress Notes (Signed)
Spoke to [employer], NP about elevated BP (see flowsheet for BP).  Per Cyndee, no action necessary at this time. Most likely due to strong dose of steroids.  Instructed pt to take BP at home (or local [facility]) if able.  Pt stating he feels fine, will take BP at home, and will let us know if BP remains elevated.

## 2014-10-24 NOTE — Assessment & Plan Note (Addendum)
Blood pressure today was elevated to 185/91.  Most likely, this is secondary to patient's anxiety and stress.  Patient has no history of hypertension; and takes no medication for his blood pressure.  Advised patient to recheck his blood pressure once he arrives back home; and let us know if it remains elevated.

## 2014-10-24 NOTE — Telephone Encounter (Signed)
s.w pt and he requested to have appt after 4pm...i let him know that out providers do not sched after that time. He said he is not taking any more time off of work.

## 2014-10-24 NOTE — Assessment & Plan Note (Signed)
Bilirubin has increased to 3.17 today.  Most likely, this is secondary to recurrence of hemolytic anemia as well.  Will continue to monitor closely.

## 2014-10-24 NOTE — Assessment & Plan Note (Signed)
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  He currently has a left groin abscess that apparently ruptured yesterday.  On exam-approximate 2 cm in diameter abscess to left groin area that is currently draining purulent fluid.  Area is tender; with minimal erythema or warmth.  There continues with some edema to the site.  No red streaks noted.  Advised patient to continue with the Epson salt soaks he has been doing; and to also apply warm/moist compresses to the site to help continue with the draining of the abscess.  Also, prescription doxycycline antibiotics for the patient to take as well.  Advised patient to follow-up with either an urgent care or the planned primary care physician he hopes to see within the next week or so.

## 2014-10-24 NOTE — Progress Notes (Signed)
SYMPTOM MANAGEMENT CLINIC   HPI: Travis Mcdaniel 44 y.o. male diagnosed with hemolytic anemia following diagnosis of influenza A and H1 N1.  Recently completed a prednisone taper.   Patient completed his prednisone taper on Friday, 10/12/2014.  Patient feels that he has recovered well since diagnosis of both influenza A and H1 N1.  Patient developed recurring hematuria last week.  He states that the hematuria has progressed from only happening once per day first thing in the morning; to hematuria all day now.  He states that his urine is a dark, rusty red/brown color now.  He denies any dysuria, frequency, or foul odor to his urine.  Hemoglobin has dropped from 12.5 down to 11.3; and now currently at 10.2.  Patient denies any worsening issues with fatigue or shortness of breath with exertion.  Patient reports that he used to take Xanax in the past; approximately 5 years ago for chronic anxiety issues.  He has been taken 1-2 Ativan tablets each day to help him with his anxiety recently.  Patient states that he is out of the Ativan; is requesting a refill.  Pt reports drinking fairly heavy this weekend in "celebration of recovering from his recent illness".   Also, patient has a left groin abscess that apparently ruptured last night.  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  He denies any recent fevers or chills.  Hematuria    Review of Systems  Genitourinary: Positive for hematuria.    Past Medical History  Diagnosis Date  . Diverticulosis     History reviewed. No pertinent past surgical history.  has Hemolytic anemia; Acute kidney injury; UTI (lower urinary tract infection); Transaminitis; Hematuria; Alcohol dependence; Diarrhea; Absolute anemia; Influenza A (H1N1); Thrombocytopenia; Anxiety; Abscess; Hypertension; and Hyperbilirubinemia on his problem list.    has No Known Allergies.    Medication List       This list is accurate as of:  10/23/14 11:59 PM.  Always use your most recent med list.               doxycycline 50 MG capsule  Commonly known as:  VIBRAMYCIN  Take 2 capsules (100 mg total) by mouth 2 (two) times daily.     multivitamin with minerals Tabs tablet  Take 1 tablet by mouth daily.     predniSONE 20 MG tablet  Commonly known as:  DELTASONE  Take 4 tabs (80 mg) PO Q AM.         PHYSICAL EXAMINATION  Oncology Vitals 10/23/2014 10/18/2014 10/12/2014 09/28/2014 09/14/2014 09/07/2014 09/07/2014  Height - - 168 cm 168 cm 168 cm - -  Weight 69.264 kg 70.58 kg 70.58 kg 70.58 kg 69.491 kg - -  Weight (lbs) 152 lbs 11 oz 155 lbs 10 oz 155 lbs 10 oz 155 lbs 10 oz 153 lbs 3 oz - -  BMI (kg/m2) - - 25.11 kg/m2 25.11 kg/m2 24.73 kg/m2 - -  Temp 97.7 98.4 98.1 98.2 98.4 - 98.5  Pulse 78 99 72 65 56 80 -  Resp '18 18 18 18 18 19 ' -  SpO2 100 100 100 100 100 100 -  BSA (m2) - - 1.81 m2 1.81 m2 1.8 m2 - -   BP Readings from Last 3 Encounters:  10/23/14 185/91  10/18/14 177/85  10/12/14 156/80    Physical Exam  Constitutional: He is oriented to person, place, and time.  Patient appears fatigued and chronically ill today.  HENT:  Head: Normocephalic and atraumatic.  Mouth/Throat: Oropharynx is clear and moist.  Eyes: Conjunctivae and EOM are normal. Pupils are equal, round, and reactive to light. Right eye exhibits no discharge. Left eye exhibits no discharge. Scleral icterus is present.  Bilateral scleral icterus noted today.  Neck: Normal range of motion. Neck supple. No JVD present. No tracheal deviation present. No thyromegaly present.  Cardiovascular: Normal rate, regular rhythm, normal heart sounds and intact distal pulses.   Pulmonary/Chest: Effort normal and breath sounds normal. No respiratory distress. He has no wheezes. He has no rales. He exhibits no tenderness.  Abdominal: Soft. Bowel sounds are normal. He exhibits no distension and no mass. There is no tenderness. There is no rebound and no guarding.   Musculoskeletal: Normal range of motion. He exhibits no edema or tenderness.  Lymphadenopathy:    He has no cervical adenopathy.  Neurological: He is alert and oriented to person, place, and time. Gait normal.  Skin: Skin is warm and dry. No rash noted. No erythema. No pallor.  Psychiatric:  Patient appears anxious today.  Nursing note and vitals reviewed.   LABORATORY DATA:. Appointment on 10/23/2014  Component Date Value Ref Range Status  . WBC 10/23/2014 11.6* 4.0 - 10.3 10e3/uL Final  . NEUT# 10/23/2014 9.2* 1.5 - 6.5 10e3/uL Final  . HGB 10/23/2014 10.2* 13.0 - 17.1 g/dL Final  . HCT 10/23/2014 31.8* 38.4 - 49.9 % Final  . Platelets 10/23/2014 152  140 - 400 10e3/uL Final  . MCV 10/23/2014 95.2  79.3 - 98.0 fL Final  . MCH 10/23/2014 30.5  27.2 - 33.4 pg Final  . MCHC 10/23/2014 32.1  32.0 - 36.0 g/dL Final  . RBC 10/23/2014 3.34* 4.20 - 5.82 10e6/uL Final  . RDW 10/23/2014 22.4* 11.0 - 14.6 % Final  . lymph# 10/23/2014 1.4  0.9 - 3.3 10e3/uL Final  . MONO# 10/23/2014 1.0* 0.1 - 0.9 10e3/uL Final  . Eosinophils Absolute 10/23/2014 0.0  0.0 - 0.5 10e3/uL Final  . Basophils Absolute 10/23/2014 0.0  0.0 - 0.1 10e3/uL Final  . NEUT% 10/23/2014 79.3* 39.0 - 75.0 % Final  . LYMPH% 10/23/2014 11.9* 14.0 - 49.0 % Final  . MONO% 10/23/2014 8.3  0.0 - 14.0 % Final  . EOS% 10/23/2014 0.3  0.0 - 7.0 % Final  . BASO% 10/23/2014 0.2  0.0 - 2.0 % Final  . Haptoglobin 10/23/2014 <15* 43 - 212 mg/dL Final  . Glucose 10/23/2014 Negative  Negative mg/dL Final  . Bilirubin (Urine) 10/23/2014 Color Interference  Negative Final  . Ketones 10/23/2014 Color Interference  Negative mg/dL Final  . Specific Gravity, Urine 10/23/2014 Color Interference  1.003 - 1.035 Final  . Blood 10/23/2014 Large  Negative Final  . pH 10/23/2014 Color Interference  4.6 - 8.0 Final  . Protein 10/23/2014 Color Interference  Negative- <30 mg/dL Final  . Urobilinogen, UR 10/23/2014 Color Interference  0.2 - 1 mg/dL  Final  . Nitrite 10/23/2014 Color Interference  Negative Final  . Leukocyte Esterase 10/23/2014 Color Interference  Negative Final  . RBC / HPF 10/23/2014 3-6  0 - 2 Final  . WBC, UA 10/23/2014 3-6  0 - 2 Final  . Bacteria, UA 10/23/2014 Few  Negative- Trace Final  . Casts 10/23/2014 Few Granular and Rare hyaline casts  Negative Final  . Epithelial Cells 10/23/2014 Few  Negative- Few Final  . Amorphous, UA 10/23/2014 Moderate  Negative- Small Final  . Sodium 10/23/2014 140  136 - 145 mEq/L Final  .  Potassium 10/23/2014 4.4  3.5 - 5.1 mEq/L Final  . Chloride 10/23/2014 104  98 - 109 mEq/L Final  . CO2 10/23/2014 21* 22 - 29 mEq/L Final  . Glucose 10/23/2014 108  70 - 140 mg/dl Final  . BUN 10/23/2014 12.9  7.0 - 26.0 mg/dL Final  . Creatinine 10/23/2014 1.0  0.7 - 1.3 mg/dL Final  . Total Bilirubin 10/23/2014 3.17* 0.20 - 1.20 mg/dL Final  . Alkaline Phosphatase 10/23/2014 71  40 - 150 U/L Final  . AST 10/23/2014 358* 5 - 34 U/L Final  . ALT 10/23/2014 47  0 - 55 U/L Final  . Total Protein 10/23/2014 7.7  6.4 - 8.3 g/dL Final  . Albumin 10/23/2014 3.9  3.5 - 5.0 g/dL Final  . Calcium 10/23/2014 9.4  8.4 - 10.4 mg/dL Final  . Anion Gap 10/23/2014 15* 3 - 11 mEq/L Final  . EGFR 10/23/2014 88* >90 ml/min/1.73 m2 Final   eGFR is calculated using the CKD-EPI Creatinine Equation (2009)  . LDH 10/23/2014 4,231* 125 - 245 U/L Final     RADIOGRAPHIC STUDIES: No results found.  ASSESSMENT/PLAN:    Hemolytic anemia Patient completed his prednisone taper on Friday, 10/12/2014.  Patient feels that he has recovered well since diagnosis of both influenza A and H1 N1.  Patient developed recurring hematuria last week.  He states that the hematuria has progressed from only happening once per day first thing in the morning; to hematuria all day now.  He states that his urine is a dark, rusty red/brown color now.  He denies any dysuria, frequency, or foul odor to his urine.  Hemoglobin has dropped  from 12.5 down to 11.3; and now currently at 10.2.  Patient denies any worsening issues with fatigue or shortness of breath with exertion.  Most likely, patient's worsening hematuria and elevated liver and bilirubin is secondary to recurrent  hemolytic anemia.  Patient will reinitiate Prednisone 80 mg per day for a total of one week.  He will also return to the [facility] tomorrow 10/24/2014 for an IVIG infusion.  The plan is for the patient to return to the [facility] for repeat labs and follow up visit next Tuesday, 10/30/2014  Also, patient was advised that he needs to arrange for a primary care provider as well.  Was able to obtain a new patient appointment at [facility] for 11/16/2014 at 1:45 PM.  Was also advised per [facility] the patient may very well be able to be added to the urology urgent care clinic once notes from today's visit are faxed to them.         Transaminitis Patient has a history of hyperbilirubinemia and transaminitis in the past.  Patient does admit to drinking fairly heavy this past weekend.  AST has increased from 161 up to 358.  ALT has increased from 31 up to 47.  On exam-abdomen soft, bowels as possible in all 4 quads, and nontender.  Bilateral sclera do appear jaundiced today.  Patient was advised to minimize all alcohol intake to see if this helps with his elevated liver enzymes.     Hematuria Patient now reports that his hematuria has progressed from only occurring first thing in the morning to occurring all day.  Also, the color his urine has changed to a dark, rusty red/brown color now.  Patient denies any dysuria, frequency, or foul odor to his urine.  Hemoglobin has dropped further today from 11.3 down to 10.2.  Platelet count has  stabilized.  Most  Patient denies any worsening issues with fatigue or shortness of breath with exertion.  Most likely, increased hematuria is secondary to recurrence of hemolytic anemia.  The plan is for  the patient to initiate high-dose prednisone for treatment of recurring hemolytic anemia.  Was able to obtain a new patient appointment at [facility] for 11/16/2014 at 1:45 PM.  Was also advised  per [facility] the patient may very well be able to be added to the urology urgent care clinic once notes from today's visit are faxed to them.  Patient was advised he would need to call [facility] office with further insurance information 1-2 weeks prior to his appointment; just in case he needs referral paperwork filled out.  Today's visit notes will be faxed to [facility] per their request.         Alcohol dependence Patient admits to drinking fairly heavily this weekend in celebration of his recovery from influenza A and H1 N1.  Both patient's liver enzymes and bilirubin are elevated today.  Patient denies any abdominal discomfort whatsoever.  Patient was advised to minimize/discontinued alcohol intake if at all possible.     Anxiety Patient reports that he used to take Xanax in the past; approximately 5 years ago for chronic anxiety issues.  He has been taken 1-2 Ativan tablets each day to help him with his anxiety recently.  Patient states that he is out of the Ativan; is requesting a refill.  Advised patient he would need to obtain a primary care physician to manage his chronic anxiety issues.  Patient stated that he prefers to hold on obtaining a primary care physician; stating that he has too many health care-related bills already.  He stated he would buy Ativan at the bar from someone instead.     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  He currently has a left groin abscess that apparently ruptured yesterday.  On exam-approximate 2 cm in diameter abscess to left groin area that is currently draining purulent fluid.  Area is tender; with minimal erythema or warmth.  There continues with some edema to the site.   No red streaks noted.  Advised patient to continue with the Epson salt soaks he has been doing; and to also apply warm/moist compresses to the site to help continue with the draining of the abscess.  Also, prescription doxycycline antibiotics for the patient to take as well.  Advised patient to follow-up with either an urgent care or the planned primary care physician he hopes to see within the next week or so.   Hypertension Blood pressure today was elevated to 185/91.  Most likely, this is secondary to patient's anxiety and stress.  Patient has no history of hypertension; and takes no medication for his blood pressure.  Advised patient to recheck his blood pressure once he arrives back home; and let us know if it remains elevated.   Hyperbilirubinemia Bilirubin has increased to 3.17 today.  Most likely, this is secondary to recurrence of hemolytic anemia as well.  Will continue to monitor closely.    Patient stated understanding of all instructions; and was in agreement with this plan of care. The patient knows to call the clinic with any problems, questions or concerns.   This was a shared visit with Dr. Julien Nordmann today.   Total time spent with patient was 40 minutes;  with greater than 75 percent of that time spent in face  to face counseling regarding patient's symptoms,  and coordination of care and follow up.  Disclaimer: This note was dictated with voice recognition software. Similar sounding words can inadvertently be transcribed and may not be corrected upon review.   Drue Second, NP 10/24/2014   ADDENDUM:  Hematology/Oncology Attending:  I had a face to face encounter with the patient. I recommended his care plan. This is a very pleasant 44 years old white male who was diagnosed with hemolytic anemia secondary to influenza A as well as H1 N1 infection. The patient was treated with a tapered dose of prednisone and he felt better with improvement in his hemoglobin and hematocrit  as well as platelets count. Over the last week the patient started noticing change in the color of his urine which became reddish-brown with increasing fatigue. He also has a history of alcohol abuse and he is still drinking at regular basis.  I ordered several studies today to evaluate the patient including repeat CBC, comprehensive metabolic panel, LDH as well as haptoglobin. This results indicated that the patient has recurrence of his hemolytic anemia. I will also order [facility] panel. I will start him again on prednisone 80 mg by mouth daily. I would also start the patient on high-dose IVIG tomorrow. The patient would come back for follow-up visit in one week for reevaluation. He was advised to call immediately if he has any concerning symptoms in the interval.  Disclaimer: This note was dictated with voice recognition software. Similar sounding words can inadvertently be transcribed and may be missed upon review. Eilleen Kempf., MD 10/27/2014

## 2014-10-24 NOTE — Assessment & Plan Note (Signed)
Patient admits to drinking fairly heavily this weekend in celebration of his recovery from influenza A and H1 N1.  Both patient's liver enzymes and bilirubin are elevated today.  Patient denies any abdominal discomfort whatsoever.  Patient was advised to minimize/discontinued alcohol intake if at all possible.

## 2014-10-29 ENCOUNTER — Ambulatory Visit: Payer: 59 | Admitting: Oncology

## 2014-10-29 ENCOUNTER — Other Ambulatory Visit: Payer: 59

## 2014-10-30 ENCOUNTER — Other Ambulatory Visit (HOSPITAL_BASED_OUTPATIENT_CLINIC_OR_DEPARTMENT_OTHER): Payer: 59

## 2014-10-30 ENCOUNTER — Ambulatory Visit (HOSPITAL_BASED_OUTPATIENT_CLINIC_OR_DEPARTMENT_OTHER): Payer: 59 | Admitting: Nurse Practitioner

## 2014-10-30 ENCOUNTER — Telehealth: Payer: Self-pay | Admitting: Internal Medicine

## 2014-10-30 VITALS — BP 182/78 | HR 62 | Temp 97.8°F | Resp 16 | Wt 157.6 lb

## 2014-10-30 DIAGNOSIS — F419 Anxiety disorder, unspecified: Secondary | ICD-10-CM | POA: Diagnosis not present

## 2014-10-30 DIAGNOSIS — R310 Gross hematuria: Secondary | ICD-10-CM

## 2014-10-30 DIAGNOSIS — R319 Hematuria, unspecified: Secondary | ICD-10-CM

## 2014-10-30 DIAGNOSIS — D589 Hereditary hemolytic anemia, unspecified: Secondary | ICD-10-CM

## 2014-10-30 DIAGNOSIS — D599 Acquired hemolytic anemia, unspecified: Secondary | ICD-10-CM

## 2014-10-30 DIAGNOSIS — R7401 Elevation of levels of liver transaminase levels: Secondary | ICD-10-CM

## 2014-10-30 DIAGNOSIS — D696 Thrombocytopenia, unspecified: Secondary | ICD-10-CM

## 2014-10-30 DIAGNOSIS — F1020 Alcohol dependence, uncomplicated: Secondary | ICD-10-CM

## 2014-10-30 DIAGNOSIS — R740 Nonspecific elevation of levels of transaminase and lactic acid dehydrogenase [LDH]: Secondary | ICD-10-CM

## 2014-10-30 DIAGNOSIS — L0291 Cutaneous abscess, unspecified: Secondary | ICD-10-CM

## 2014-10-30 LAB — URINALYSIS, MICROSCOPIC - CHCC
BLOOD: NEGATIVE
Bilirubin (Urine): NEGATIVE
Glucose: NEGATIVE mg/dL
Ketones: NEGATIVE mg/dL
Leukocyte Esterase: NEGATIVE
NITRITE: NEGATIVE
PH: 6.5 (ref 4.6–8.0)
PROTEIN: NEGATIVE mg/dL
Specific Gravity, Urine: 1.005 (ref 1.003–1.035)
UROBILINOGEN UR: 0.2 mg/dL (ref 0.2–1)

## 2014-10-30 LAB — COMPREHENSIVE METABOLIC PANEL (CC13)
ALT: 21 U/L (ref 0–55)
AST: 26 U/L (ref 5–34)
Albumin: 3.8 g/dL (ref 3.5–5.0)
Alkaline Phosphatase: 77 U/L (ref 40–150)
Anion Gap: 12 mEq/L — ABNORMAL HIGH (ref 3–11)
BILIRUBIN TOTAL: 0.65 mg/dL (ref 0.20–1.20)
BUN: 13.4 mg/dL (ref 7.0–26.0)
CO2: 25 mEq/L (ref 22–29)
Calcium: 9.2 mg/dL (ref 8.4–10.4)
Chloride: 105 mEq/L (ref 98–109)
Creatinine: 0.8 mg/dL (ref 0.7–1.3)
EGFR: >90 mL/min/{1.73_m2} (ref >90)
Glucose: 113 mg/dl (ref 70–140)
POTASSIUM: 3.5 meq/L (ref 3.5–5.1)
Sodium: 141 mEq/L (ref 136–145)
Total Protein: 7.1 g/dL (ref 6.4–8.3)

## 2014-10-30 LAB — CBC WITH DIFFERENTIAL/PLATELET
BASO%: 0.1 % (ref 0.0–2.0)
BASOS ABS: 0 10*3/uL (ref 0.0–0.1)
EOS%: 0 % (ref 0.0–7.0)
Eosinophils Absolute: 0 10*3/uL (ref 0.0–0.5)
HEMATOCRIT: 34.4 % — AB (ref 38.4–49.9)
HGB: 10.6 g/dL — ABNORMAL LOW (ref 13.0–17.1)
LYMPH%: 24.2 % (ref 14.0–49.0)
MCH: 30 pg (ref 27.2–33.4)
MCHC: 30.8 g/dL — AB (ref 32.0–36.0)
MCV: 97.5 fL (ref 79.3–98.0)
MONO#: 0.7 10*3/uL (ref 0.1–0.9)
MONO%: 8.5 % (ref 0.0–14.0)
NEUT#: 5.5 10*3/uL (ref 1.5–6.5)
NEUT%: 67.2 % (ref 39.0–75.0)
Platelets: 174 10*3/uL (ref 140–400)
RBC: 3.53 10*6/uL — ABNORMAL LOW (ref 4.20–5.82)
RDW: 20.9 % — ABNORMAL HIGH (ref 11.0–14.6)
WBC: 8.1 10*3/uL (ref 4.0–10.3)
lymph#: 2 10*3/uL (ref 0.9–3.3)

## 2014-10-30 LAB — LACTATE DEHYDROGENASE (CC13): LDH: 1083 U/L — ABNORMAL HIGH (ref 125–245)

## 2014-10-30 MED ORDER — PREDNISONE 20 MG PO TABS: ORAL_TABLET | ORAL | Status: DC

## 2014-10-30 NOTE — Telephone Encounter (Signed)
Gave patient avs report and appointments for May  °

## 2014-10-31 LAB — URINE CULTURE

## 2014-11-01 ENCOUNTER — Encounter: Payer: Self-pay | Admitting: Nurse Practitioner

## 2014-11-01 NOTE — Assessment & Plan Note (Signed)
Bilirubin has improved from 3.17 down to 0.65.  Will continue to monitor closely.

## 2014-11-01 NOTE — Assessment & Plan Note (Signed)
Urinalysis obtained today revealed no hematuria, or infection.

## 2014-11-01 NOTE — Assessment & Plan Note (Signed)
Patient states that he has had no alcohol since last week.  Advised patient to continue limiting all alcohol if at all possible.

## 2014-11-01 NOTE — Assessment & Plan Note (Signed)
Patient has completed 1 week of prednisone 80 mg daily for treatment of his hemolytic anemia symptoms.  He states that his hematuria has completely resolved; and he is feeling much better.  On exam.-Patient appears well rested and hydrated.  He has no abdominal discomfort or flank pain.  Labs have greatly improved as well.  Urinalysis revealed no hematuria, or infection.  Bilirubin and liver enzymes have returned to baseline.  LDH has improved from 4231 down to 1083.  Patient was given a New prescription for prednisone 80 mg per day for another 2 weeks.  Plan is for the patient to undergo a Very slow taper of prednisone for better management of hemolytic anemia symptoms.  Patient will return on 11/12/2014 for labs and a Follow-up visit.

## 2014-11-01 NOTE — Assessment & Plan Note (Signed)
Patient reports that he used to take Xanax in the past; approximately 5 years ago for chronic anxiety issues.  He has been taken 1-2 Ativan tablets each day to help him with his anxiety recently.  Patient states that he is out of the Ativan; is requesting a refill.  Advised patient he would need to obtain a primary care physician to manage his chronic anxiety issues.

## 2014-11-01 NOTE — Progress Notes (Signed)
SYMPTOM MANAGEMENT CLINIC   HPI: Travis Mcdaniel 44 y.o. male diagnosed with hemolytic anemia following diagnosis of influenza A and H1 N1.  Patient returns today after completing a One-week prescription of prednisone 80 mg per day.  He states he is feeling much better today.  He reports that all hematuria has completely resolved.  He denies any dehydration issues.  He denies any recent fevers or chills.  He reports minimal to no alcohol whatsoever within this past week as well.  Also, patient reports that the left groin abscess.  He had last week has essentially resolved.  He has almost completed the doxycycline antibiotics.  Hematuria    Review of Systems  Genitourinary: Positive for hematuria.    Past Medical History  Diagnosis Date  . Diverticulosis     History reviewed. No pertinent past surgical history.  has Hemolytic anemia; Acute kidney injury; UTI (lower urinary tract infection); Transaminitis; Hematuria; Alcohol dependence; Diarrhea; Absolute anemia; Influenza A (H1N1); Thrombocytopenia; Anxiety; Abscess; Hypertension; and Hyperbilirubinemia on his problem list.    has No Known Allergies.    Medication List       This list is accurate as of: 10/30/14 11:59 PM.  Always use your most recent med list.               doxycycline 50 MG capsule  Commonly known as:  VIBRAMYCIN  Take 2 capsules (100 mg total) by mouth 2 (two) times daily.     multivitamin with minerals Tabs tablet  Take 1 tablet by mouth daily.     predniSONE 20 MG tablet  Commonly known as:  DELTASONE  Take 4 tabs (80 mg) PO Q AM.         PHYSICAL EXAMINATION  Oncology Vitals 10/30/2014 10/24/2014 10/24/2014 10/24/2014 10/24/2014 10/24/2014 10/24/2014  Height - - - - - - -  Weight 71.487 kg - - - - - -  Weight (lbs) 157 lbs 10 oz - - - - - -  BMI (kg/m2) - - - - - - -  Temp 97.8 98 97.8 98.2 98.2 98.2 98  Pulse 62 53 60 58 58 60 74  Resp '16 16 16 16 ' - - -  SpO2 - 98 97 96 - 94 -  BSA  (m2) - - - - - - -   BP Readings from Last 3 Encounters:  10/30/14 182/78  10/24/14 196/68  10/23/14 185/91    Physical Exam  Constitutional: He is oriented to person, place, and time and well-developed, well-nourished, and in no distress.  HENT:  Head: Normocephalic and atraumatic.  Mouth/Throat: Oropharynx is clear and moist.  Eyes: Conjunctivae and EOM are normal. Pupils are equal, round, and reactive to light. Right eye exhibits no discharge. Left eye exhibits no discharge. No scleral icterus.  Neck: Normal range of motion. Neck supple. No JVD present. No tracheal deviation present. No thyromegaly present.  Cardiovascular: Normal rate, regular rhythm, normal heart sounds and intact distal pulses.   Pulmonary/Chest: Effort normal and breath sounds normal. No respiratory distress. He has no wheezes. He has no rales. He exhibits no tenderness.  Abdominal: Soft. Bowel sounds are normal. He exhibits no distension and no mass. There is no tenderness. There is no rebound and no guarding.  Musculoskeletal: Normal range of motion. He exhibits no edema or tenderness.  Lymphadenopathy:    He has no cervical adenopathy.  Neurological: He is alert and oriented to person, place, and time. Gait normal.  Skin: Skin is  warm and dry. No rash noted. No erythema. No pallor.  Psychiatric: Affect normal.  Patient appears anxious today.  Nursing note and vitals reviewed.   LABORATORY DATA:. Appointment on 10/30/2014  Component Date Value Ref Range Status  . WBC 10/30/2014 8.1  4.0 - 10.3 10e3/uL Final  . NEUT# 10/30/2014 5.5  1.5 - 6.5 10e3/uL Final  . HGB 10/30/2014 10.6* 13.0 - 17.1 g/dL Final  . HCT 10/30/2014 34.4* 38.4 - 49.9 % Final  . Platelets 10/30/2014 174  140 - 400 10e3/uL Final  . MCV 10/30/2014 97.5  79.3 - 98.0 fL Final  . MCH 10/30/2014 30.0  27.2 - 33.4 pg Final  . MCHC 10/30/2014 30.8* 32.0 - 36.0 g/dL Final  . RBC 10/30/2014 3.53* 4.20 - 5.82 10e6/uL Final  . RDW 10/30/2014  20.9* 11.0 - 14.6 % Final  . lymph# 10/30/2014 2.0  0.9 - 3.3 10e3/uL Final  . MONO# 10/30/2014 0.7  0.1 - 0.9 10e3/uL Final  . Eosinophils Absolute 10/30/2014 0.0  0.0 - 0.5 10e3/uL Final  . Basophils Absolute 10/30/2014 0.0  0.0 - 0.1 10e3/uL Final  . NEUT% 10/30/2014 67.2  39.0 - 75.0 % Final  . LYMPH% 10/30/2014 24.2  14.0 - 49.0 % Final  . MONO% 10/30/2014 8.5  0.0 - 14.0 % Final  . EOS% 10/30/2014 0.0  0.0 - 7.0 % Final  . BASO% 10/30/2014 0.1  0.0 - 2.0 % Final  . Sodium 10/30/2014 141  136 - 145 mEq/L Final  . Potassium 10/30/2014 3.5  3.5 - 5.1 mEq/L Final  . Chloride 10/30/2014 105  98 - 109 mEq/L Final  . CO2 10/30/2014 25  22 - 29 mEq/L Final  . Glucose 10/30/2014 113  70 - 140 mg/dl Final  . BUN 10/30/2014 13.4  7.0 - 26.0 mg/dL Final  . Creatinine 10/30/2014 0.8  0.7 - 1.3 mg/dL Final  . Total Bilirubin 10/30/2014 0.65  0.20 - 1.20 mg/dL Final  . Alkaline Phosphatase 10/30/2014 77  40 - 150 U/L Final  . AST 10/30/2014 26  5 - 34 U/L Final  . ALT 10/30/2014 21  0 - 55 U/L Final  . Total Protein 10/30/2014 7.1  6.4 - 8.3 g/dL Final  . Albumin 10/30/2014 3.8  3.5 - 5.0 g/dL Final  . Calcium 10/30/2014 9.2  8.4 - 10.4 mg/dL Final  . Anion Gap 10/30/2014 12* 3 - 11 mEq/L Final  . EGFR 10/30/2014 >90  >90 ml/min/1.73 m2 Final   eGFR is calculated using the CKD-EPI Creatinine Equation (2009)  . Glucose 10/30/2014 Negative  Negative mg/dL Final  . Bilirubin (Urine) 10/30/2014 Negative  Negative Final  . Ketones 10/30/2014 Negative  Negative mg/dL Final  . Specific Gravity, Urine 10/30/2014 1.005  1.003 - 1.035 Final  . Blood 10/30/2014 Negative  Negative Final  . pH 10/30/2014 6.5  4.6 - 8.0 Final  . Protein 10/30/2014 Negative  Negative- <30 mg/dL Final  . Urobilinogen, UR 10/30/2014 0.2  0.2 - 1 mg/dL Final  . Nitrite 10/30/2014 Negative  Negative Final  . Leukocyte Esterase 10/30/2014 Negative  Negative Final  . RBC / HPF 10/30/2014 0-2  0 - 2 Final  . WBC, UA  10/30/2014 0-2  0 - 2 Final  . Bacteria, UA 10/30/2014 Trace  Negative- Trace Final  . Urine Culture, Routine 10/30/2014 Culture, Urine   Final   Comment: Final - ===== COLONY COUNT: ===== NO GROWTH NO GROWTH   . LDH 10/30/2014 1,083 Sl Hemolysis* 125 - 245  U/L Final     RADIOGRAPHIC STUDIES: No results found.  ASSESSMENT/PLAN:    Hemolytic anemia Patient has completed 1 week of prednisone 80 mg daily for treatment of his hemolytic anemia symptoms.  He states that his hematuria has completely resolved; and he is feeling much better.  On exam.-Patient appears well rested and hydrated.  He has no abdominal discomfort or flank pain.  Labs have greatly improved as well.  Urinalysis revealed no hematuria, or infection.  Bilirubin and liver enzymes have returned to baseline.  LDH has improved from 4231 down to 1083.  Patient was given a New prescription for prednisone 80 mg per day for another 2 weeks.  Plan is for the patient to undergo a Very slow taper of prednisone for better management of hemolytic anemia symptoms.  Patient will return on 11/12/2014 for labs and a Follow-up visit.   Transaminitis All liver enzymes have returned to baseline.   Hematuria Urinalysis obtained today revealed no hematuria, or infection.   Alcohol dependence Patient states that he has had no alcohol since last week.  Advised patient to continue limiting all alcohol if at all possible.   Anxiety Patient reports that he used to take Xanax in the past; approximately 5 years ago for chronic anxiety issues.  He has been taken 1-2 Ativan tablets each day to help him with his anxiety recently.  Patient states that he is out of the Ativan; is requesting a refill.  Advised patient he would need to obtain a primary care physician to manage his chronic anxiety issues.      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  Patient states that the left groin abscess he  had last week has essentially resolved.  He states he has a few days left of his doxycycline antibiotics.   Hyperbilirubinemia Bilirubin has improved from 3.17 down to 0.65.  Will continue to monitor closely.    Patient stated understanding of all instructions; and was in agreement with this plan of care. The patient knows to call the clinic with any problems, questions or concerns.   This was a shared visit with Dr. Julien Nordmann today.   Total time spent with patient was 25 minutes;  with greater than 75 percent of that time spent in face to face counseling regarding patient's symptoms,  and coordination of care and follow up.  Disclaimer: This note was dictated with voice recognition software. Similar sounding words can inadvertently be transcribed and may not be corrected upon review.   Drue Second, NP 11/01/2014   ADDENDUM: Hematology/Oncology Attending: I had a face to face encounter with the patient. I recommended his care plan. This is a very pleasant 44 years old white male with hemolytic anemia who is currently on observation but recently had relapse of his hemolytic anemia. He was started again on prednisone 80 mg by mouth daily for the last week. He was also given 1 dose of IVIG. The patient is feeling much better today with significant improvement in his lab. Especially his the liver enzymes and hemolytic parameters. I recommended for him to continue on treatment with prednisone 80 mg by mouth daily for 2 more weeks, followed by a slow taper. We will see him back for follow-up visit in 2 weeks for reevaluation and management of any adverse effect of his treatment. He was advised to call immediately if he has any concerning symptoms in the interval.  Disclaimer: This note was dictated with voice  recognition software. Similar sounding words can inadvertently be transcribed and may be missed upon review. Eilleen Kempf., MD 11/03/2014

## 2014-11-01 NOTE — Assessment & Plan Note (Signed)
All liver enzymes have returned to baseline.

## 2014-11-01 NOTE — Assessment & Plan Note (Addendum)
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  Patient states that the left groin abscess he had last week has essentially resolved.  He states he has a few days left of his doxycycline antibiotics.

## 2014-11-06 LAB — PNH PROFILE (-HIGH SENSITIVITY)
Number of markers:: 9
Viability (%): 52 %

## 2014-11-12 ENCOUNTER — Telehealth: Payer: Self-pay | Admitting: Internal Medicine

## 2014-11-12 ENCOUNTER — Ambulatory Visit (HOSPITAL_BASED_OUTPATIENT_CLINIC_OR_DEPARTMENT_OTHER): Payer: 59 | Admitting: Oncology

## 2014-11-12 ENCOUNTER — Encounter: Payer: Self-pay | Admitting: Oncology

## 2014-11-12 ENCOUNTER — Other Ambulatory Visit (HOSPITAL_BASED_OUTPATIENT_CLINIC_OR_DEPARTMENT_OTHER): Payer: 59

## 2014-11-12 VITALS — BP 169/76 | HR 81 | Temp 98.1°F | Resp 80 | Ht 66.0 in | Wt 161.6 lb

## 2014-11-12 DIAGNOSIS — D595 Paroxysmal nocturnal hemoglobinuria [Marchiafava-Micheli]: Secondary | ICD-10-CM | POA: Diagnosis not present

## 2014-11-12 DIAGNOSIS — D598 Other acquired hemolytic anemias: Secondary | ICD-10-CM | POA: Diagnosis not present

## 2014-11-12 DIAGNOSIS — D599 Acquired hemolytic anemia, unspecified: Secondary | ICD-10-CM | POA: Diagnosis not present

## 2014-11-12 DIAGNOSIS — D589 Hereditary hemolytic anemia, unspecified: Secondary | ICD-10-CM

## 2014-11-12 LAB — CBC WITH DIFFERENTIAL/PLATELET
BASO%: 0.2 % (ref 0.0–2.0)
Basophils Absolute: 0 10*3/uL (ref 0.0–0.1)
EOS ABS: 0 10*3/uL (ref 0.0–0.5)
EOS%: 0 % (ref 0.0–7.0)
HCT: 38.5 % (ref 38.4–49.9)
HGB: 12.1 g/dL — ABNORMAL LOW (ref 13.0–17.1)
LYMPH#: 0.7 10*3/uL — AB (ref 0.9–3.3)
LYMPH%: 6.2 % — ABNORMAL LOW (ref 14.0–49.0)
MCH: 29.5 pg (ref 27.2–33.4)
MCHC: 31.4 g/dL — ABNORMAL LOW (ref 32.0–36.0)
MCV: 94.2 fL (ref 79.3–98.0)
MONO#: 0.1 10*3/uL (ref 0.1–0.9)
MONO%: 1.1 % (ref 0.0–14.0)
NEUT%: 92.5 % — ABNORMAL HIGH (ref 39.0–75.0)
NEUTROS ABS: 10.3 10*3/uL — AB (ref 1.5–6.5)
Platelets: 148 10*3/uL (ref 140–400)
RBC: 4.09 10*6/uL — ABNORMAL LOW (ref 4.20–5.82)
RDW: 20.2 % — AB (ref 11.0–14.6)
WBC: 11.1 10*3/uL — AB (ref 4.0–10.3)

## 2014-11-12 LAB — COMPREHENSIVE METABOLIC PANEL (CC13)
ALBUMIN: 3.9 g/dL (ref 3.5–5.0)
ALK PHOS: 71 U/L (ref 40–150)
ALT: 28 U/L (ref 0–55)
ANION GAP: 11 meq/L (ref 3–11)
AST: 63 U/L — ABNORMAL HIGH (ref 5–34)
BUN: 18.3 mg/dL (ref 7.0–26.0)
CHLORIDE: 105 meq/L (ref 98–109)
CO2: 26 meq/L (ref 22–29)
Calcium: 9 mg/dL (ref 8.4–10.4)
Creatinine: 1.1 mg/dL (ref 0.7–1.3)
EGFR: 84 mL/min/{1.73_m2} — AB (ref >90)
GLUCOSE: 127 mg/dL (ref 70–140)
Potassium: 4.5 mEq/L (ref 3.5–5.1)
SODIUM: 142 meq/L (ref 136–145)
TOTAL PROTEIN: 6.9 g/dL (ref 6.4–8.3)
Total Bilirubin: 2.2 mg/dL — ABNORMAL HIGH (ref 0.20–1.20)

## 2014-11-12 LAB — URINALYSIS, MICROSCOPIC - CHCC
Bilirubin (Urine): NEGATIVE
Blood: NEGATIVE
GLUCOSE UR CHCC: NEGATIVE mg/dL
Ketones: NEGATIVE mg/dL
LEUKOCYTE ESTERASE: NEGATIVE
NITRITE: NEGATIVE
Protein: NEGATIVE mg/dL
RBC / HPF: NEGATIVE (ref 0–2)
Specific Gravity, Urine: 1.005 (ref 1.003–1.035)
UROBILINOGEN UR: 0.2 mg/dL (ref 0.2–1)
pH: 6.5 (ref 4.6–8.0)

## 2014-11-12 LAB — LACTATE DEHYDROGENASE (CC13): LDH: 1210 U/L — ABNORMAL HIGH (ref 125–245)

## 2014-11-12 MED ORDER — PREDNISONE 20 MG PO TABS: 40.0000 mg | ORAL_TABLET | Freq: Every day | ORAL | Status: DC

## 2014-11-12 MED ORDER — LORAZEPAM 0.5 MG PO TABS: 0.5000 mg | ORAL_TABLET | Freq: Every evening | ORAL | Status: DC | PRN

## 2014-11-12 NOTE — Telephone Encounter (Signed)
Gave and printed appt sched and avs for pt for May and June  °

## 2014-11-12 NOTE — Progress Notes (Signed)
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   PRIOR THERAPY:  1) Tapered dose of prednisone. 2) high dose of IVIG   CURRENT THERAPY: Soliris (Eculizumab) induction with 600 mg IV weekly 4 weeks followed by maintenance treatment at a dose of 900 mg IV every 2 weeks. First dose of treatment is expected on 11/19/2014.  INTERVAL HISTORY: Travis Mcdaniel 44 y.o. male returns to the clinic today for follow-up visit. He remains on prednisone 80 mg daily. His hematuria has completely resolved. He reports that he feels good overall and nose that he is more jittery since being on prednisone. He denied having any significant chest pain, shortness of breath, cough or hemoptysis. He denied having any significant fever or chills, no nausea or vomiting. No significant weight loss or night sweats. He has no bleeding issues. He has repeat CBC and comprehensive metabolic panel performed earlier today and he is here for evaluation and discussion of his lab results and recommendation regarding treatment of his condition.  MEDICAL HISTORY: Past Medical History  Diagnosis Date  . Diverticulosis     ALLERGIES:  has No Known Allergies.  MEDICATIONS:  Current Outpatient Prescriptions  Medication Sig Dispense Refill  . doxycycline (VIBRAMYCIN) 50 MG capsule Take 2 capsules (100 mg total) by mouth 2 (two) times daily. 40 capsule 0  . Multiple Vitamin (MULTIVITAMIN WITH MINERALS) TABS tablet Take 1 tablet by mouth daily.    [name] Kitchen LORazepam (ATIVAN) 0.5 MG tablet Take 1 tablet (0.5 mg total) by mouth at bedtime as needed for anxiety. 30 tablet 0  . predniSONE (DELTASONE) 20 MG tablet Take 2 tablets (40 mg total) by mouth daily with breakfast. 30  tablet 0   No current facility-administered medications for this visit.    SURGICAL HISTORY: History reviewed. No pertinent past surgical history.  REVIEW OF SYSTEMS:  Constitutional: negative Eyes: negative Ears, nose, mouth, throat, and face: negative Respiratory: negative Cardiovascular: negative Gastrointestinal: negative Genitourinary:negative Integument/breast: negative Hematologic/lymphatic: negative Musculoskeletal:negative Neurological: negative Behavioral/Psych: negative Endocrine: negative Allergic/Immunologic: negative   PHYSICAL EXAMINATION: General appearance: alert, cooperative and no distress Head: Normocephalic, without obvious abnormality, atraumatic Neck: no adenopathy, no JVD, supple, symmetrical, trachea midline and thyroid not enlarged, symmetric, no tenderness/mass/nodules Lymph nodes: Cervical, supraclavicular, and axillary nodes normal. Resp: clear to auscultation bilaterally Back: symmetric, no curvature. ROM normal. No CVA tenderness. Cardio: regular rate and rhythm, S1, S2 normal, no murmur, click, rub or gallop GI: soft, non-tender; bowel sounds normal; no masses,  no organomegaly Extremities: extremities normal, atraumatic, no cyanosis or edema Neurologic: Alert and oriented X 3, normal strength and tone. Normal symmetric reflexes. Normal coordination and gait  ECOG PERFORMANCE STATUS: 0 - Asymptomatic  Blood pressure 169/76, pulse 81, temperature 98.1 F (36.7 C), temperature source Oral, resp. rate 80, height 5\' 6"  (1.676 m), weight 161 lb 9.6 oz (73.301 kg), SpO2 99 %.  LABORATORY DATA: Lab Results  Component Value Date   WBC 11.1* 11/12/2014   HGB 12.1* 11/12/2014   HCT 38.5 11/12/2014   MCV 94.2 11/12/2014   PLT 148 11/12/2014      Chemistry      Component Value Date/Time   NA 142 11/12/2014 1434   NA 140 09/07/2014 0407   K 4.5 11/12/2014 1434  K 3.9 09/07/2014 0407   CL 110 09/07/2014 0407   CO2 26 11/12/2014 1434   CO2 22  09/07/2014 0407   BUN 18.3 11/12/2014 1434   BUN 12 09/07/2014 0407   CREATININE 1.1 11/12/2014 1434   CREATININE 0.99 09/07/2014 0407      Component Value Date/Time   CALCIUM 9.0 11/12/2014 1434   CALCIUM 8.3* 09/07/2014 0407   ALKPHOS 71 11/12/2014 1434   ALKPHOS 30* 09/07/2014 0407   AST 63* 11/12/2014 1434   AST 120* 09/07/2014 0407   ALT 28 11/12/2014 1434   ALT 27 09/07/2014 0407   BILITOT 2.20* 11/12/2014 1434   BILITOT 0.7 09/07/2014 0407       RADIOGRAPHIC STUDIES: No results found.  ASSESSMENT AND PLAN: This is a very pleasant 43 year old white male recently diagnosed with hemolytic anemia secondary to viral infection with influenza A and H1N1.   His CBC today shows that his hemoglobin has improved 12.1 and he has a normal platelet count. His white count is slightly elevated likely due to his prednisone use. His bilirubin is elevated again today at 2.2 and his AST is elevated at 63. His LDH is elevated at 1210 today.  The patient was seen and examined with Dr. Julien Nordmann. Dr. Julien Nordmann has explained that he did additional lab work at his last visit to check for paroxysmal nocturnal hemoglobinuria (PNH). He is explaining the pathophysiology of this condition and if left untreated can lead to serious complications including kidney damage. Recommend that he begin Soliris weekly for 4-5 weeks and then switch to every 2 week infusions. The patient seems to understand and is in agreement to proceeding with this treatment. We will plan to get this treatment started next week after obtaining prior authorization.  For now, he will continue prednisone 40 mg daily until after the Soliris has started. A refill was sent to his pharmacy for this medication today.  The patient has expressed that he is very anxious about his diagnosis. He has requested a refill on his Ativan and we have given him a prescription for this with instructions that he has not uses on a regular basis as it can become  addictive.  We will see the patient back next week with repeat labs and just prior to his first infusion to answer any last minute questions.  The patient was advised to call immediately if he has any concerning symptoms. The patient voices understanding of current disease status and treatment options and is in agreement with the current care plan.  All questions were answered. The patient knows to call the clinic with any problems, questions or concerns. We can certainly see the patient much sooner if necessary.  Travis Bussing, DNP, AGPCNP-BC, AOCNP  ADDENDUM: Hematology/Oncology Attending: I had a face to face encounter with the patient. I recommended her care plan. This is a very pleasant 44 years old white male who was evaluated initially for hemolytic anemia and was treated with a tapering dose of prednisone as well as high dose IVIG with improvement in his hemoglobin and hematocrit as well as a hemolytic parameters. Laboratory test for [facility] came back positive. I discussed the results with the patient today. I recommended for him treatment with induction Eculizumab 600 mg IV weekly for 4 weeks followed by 900 mg IV every 2 weeks. The patient is expected to start the first dose of this treatment next week. The patient would come back for follow-up visit next week for evaluation before starting  the first dose of his treatment. For now he will continue on a tapered dose of prednisone 40 mg by mouth daily until the start of the first dose of his treatment. For anxiety was given a refill of Ativan to be used only at nighttime. He was advised to call immediately if he has any concerning symptoms in the interval.  Disclaimer: This note was dictated with voice recognition software. Similar sounding words can inadvertently be transcribed and may be missed upon review. Eilleen Kempf., MD 11/12/2014

## 2014-11-14 LAB — URINE CULTURE

## 2014-11-16 ENCOUNTER — Telehealth: Payer: Self-pay | Admitting: *Deleted

## 2014-11-16 NOTE — Telephone Encounter (Signed)
PT. HAD QUESTIONS ABOUT HIS FIRST TIME SOLIRIS. INFORMED PT. THAT HE WILL NOT NEED A DRIVER. THE INFUSION IS SCHEDULED FOR TWO HOURS BUT HE WILL BE OBSERVED ANOTHER HOUR FOR A REACTION. SIDE EFFECTS ARE HEADACHE, FEVER, NAUSEA, VOMITING, STIFF NECK, AND SENSITIVITY TO LIGHT. INSTRUCTED PT. TO EAT A LIGHT BREAKFAST. INFORMED PT. THAT THE NURSE IN THE INFUSION ROOM WILL REVIEW INFORMATION WITH HIM ENCOURAGED PT. TO WRITE DOWN ANY QUESTIONS AND BRING TO HIS APPOINTMENT. HE VOICES UNDERSTANDING.

## 2014-11-19 ENCOUNTER — Other Ambulatory Visit: Payer: 59

## 2014-11-19 ENCOUNTER — Ambulatory Visit: Payer: 59 | Admitting: Physician Assistant

## 2014-11-21 ENCOUNTER — Telehealth: Payer: Self-pay | Admitting: *Deleted

## 2014-11-21 ENCOUNTER — Telehealth: Payer: Self-pay | Admitting: Internal Medicine

## 2014-11-21 ENCOUNTER — Other Ambulatory Visit: Payer: Self-pay | Admitting: *Deleted

## 2014-11-21 ENCOUNTER — Ambulatory Visit (HOSPITAL_BASED_OUTPATIENT_CLINIC_OR_DEPARTMENT_OTHER): Payer: 59 | Admitting: Internal Medicine

## 2014-11-21 ENCOUNTER — Other Ambulatory Visit (HOSPITAL_BASED_OUTPATIENT_CLINIC_OR_DEPARTMENT_OTHER): Payer: 59

## 2014-11-21 ENCOUNTER — Ambulatory Visit (HOSPITAL_BASED_OUTPATIENT_CLINIC_OR_DEPARTMENT_OTHER): Payer: 59

## 2014-11-21 ENCOUNTER — Encounter: Payer: Self-pay | Admitting: Internal Medicine

## 2014-11-21 VITALS — BP 184/79 | HR 69 | Temp 97.7°F | Resp 19 | Ht 66.0 in | Wt 157.0 lb

## 2014-11-21 DIAGNOSIS — D595 Paroxysmal nocturnal hemoglobinuria [Marchiafava-Micheli]: Secondary | ICD-10-CM

## 2014-11-21 DIAGNOSIS — Z5112 Encounter for antineoplastic immunotherapy: Secondary | ICD-10-CM

## 2014-11-21 DIAGNOSIS — F411 Generalized anxiety disorder: Secondary | ICD-10-CM | POA: Diagnosis not present

## 2014-11-21 DIAGNOSIS — Z23 Encounter for immunization: Secondary | ICD-10-CM

## 2014-11-21 LAB — COMPREHENSIVE METABOLIC PANEL (CC13)
ALK PHOS: 60 U/L (ref 40–150)
ALT: 17 U/L (ref 0–55)
ANION GAP: 11 meq/L (ref 3–11)
AST: 44 U/L — AB (ref 5–34)
Albumin: 4.1 g/dL (ref 3.5–5.0)
BILIRUBIN TOTAL: 1.59 mg/dL — AB (ref 0.20–1.20)
BUN: 15.2 mg/dL (ref 7.0–26.0)
CO2: 23 meq/L (ref 22–29)
CREATININE: 0.9 mg/dL (ref 0.7–1.3)
Calcium: 9.2 mg/dL (ref 8.4–10.4)
Chloride: 107 mEq/L (ref 98–109)
EGFR: >90 mL/min/{1.73_m2} (ref >90)
Glucose: 105 mg/dl (ref 70–140)
Potassium: 4.1 mEq/L (ref 3.5–5.1)
Sodium: 141 mEq/L (ref 136–145)
TOTAL PROTEIN: 6.9 g/dL (ref 6.4–8.3)

## 2014-11-21 LAB — CBC WITH DIFFERENTIAL/PLATELET
BASO%: 0.2 % (ref 0.0–2.0)
BASOS ABS: 0 10*3/uL (ref 0.0–0.1)
EOS ABS: 0 10*3/uL (ref 0.0–0.5)
EOS%: 0.2 % (ref 0.0–7.0)
HCT: 42.3 % (ref 38.4–49.9)
HGB: 13.6 g/dL (ref 13.0–17.1)
LYMPH#: 0.7 10*3/uL — AB (ref 0.9–3.3)
LYMPH%: 7.6 % — ABNORMAL LOW (ref 14.0–49.0)
MCH: 30.2 pg (ref 27.2–33.4)
MCHC: 32.2 g/dL (ref 32.0–36.0)
MCV: 93.8 fL (ref 79.3–98.0)
MONO#: 0.1 10*3/uL (ref 0.1–0.9)
MONO%: 1.2 % (ref 0.0–14.0)
NEUT#: 8.4 10*3/uL — ABNORMAL HIGH (ref 1.5–6.5)
NEUT%: 90.8 % — ABNORMAL HIGH (ref 39.0–75.0)
PLATELETS: 138 10*3/uL — AB (ref 140–400)
RBC: 4.5 10*6/uL (ref 4.20–5.82)
RDW: 18.8 % — ABNORMAL HIGH (ref 11.0–14.6)
WBC: 9.3 10*3/uL (ref 4.0–10.3)

## 2014-11-21 LAB — LACTATE DEHYDROGENASE (CC13): LDH: 976 U/L — ABNORMAL HIGH (ref 125–245)

## 2014-11-21 MED ORDER — SODIUM CHLORIDE 0.9 % IV SOLN
Freq: Once | INTRAVENOUS | Status: AC
  Administered 2014-11-21: 12:00:00 via INTRAVENOUS

## 2014-11-21 MED ORDER — SODIUM CHLORIDE 0.9 % IV SOLN
600.0000 mg | Freq: Once | INTRAVENOUS | Status: AC
  Administered 2014-11-21: 600 mg via INTRAVENOUS
  Filled 2014-11-21: qty 60

## 2014-11-21 MED ORDER — CIPROFLOXACIN HCL 500 MG PO TABS: 500.0000 mg | ORAL_TABLET | Freq: Two times a day (BID) | ORAL | Status: DC

## 2014-11-21 MED ORDER — MENINGOCOCCAL A C Y&W-135 OLIG IM SOLR
0.5000 mL | Freq: Once | INTRAMUSCULAR | Status: AC
  Administered 2014-11-21: 0.5 mL via INTRAMUSCULAR
  Filled 2014-11-21: qty 0.5

## 2014-11-21 NOTE — Patient Instructions (Signed)
Eculizumab injection What is this medicine? ECULIZUMAB (ek yoo LYE zyoo mab) is a monoclonal antibody. It is used to treat a rare kind of anemia called paroxysmal nocturnal hemoglobinuria or PNH. It may help prevent the loss of blood in patients with PNH. This medicine may be used for other purposes; ask your health care provider or pharmacist if you have questions. COMMON BRAND NAME(S): Soliris What should I tell my health care provider before I take this medicine? They need to know if you have any of these conditions: -infection -not received meningococcal vaccine -an unusual or allergic reaction to this eculizumab, other medicines, foods, dyes, or preservatives -pregnant or trying to get pregnant -breast-feeding How should I use this medicine? This medicine is for infusion into a vein. It is given by a health care professional in a hospital or clinic setting. A special MedGuide will be given to you by the pharmacist with each prescription and refill. Be sure to read this information carefully each time. Talk to your pediatrician regarding the use of this medicine in children. Special care may be needed. Overdosage: If you think you have taken too much of this medicine contact a poison control center or emergency room at once. NOTE: This medicine is only for you. Do not share this medicine with others. What if I miss a dose? It is important not to miss your dose. Call your doctor or health care professional if you are unable to keep an appointment. What may interact with this medicine? Interactions are not expected. This list may not describe all possible interactions. Give your health care provider a list of all the medicines, herbs, non-prescription drugs, or dietary supplements you use. Also tell them if you smoke, drink alcohol, or use illegal drugs. Some items may interact with your medicine. What should I watch for while using this medicine? Your condition will be monitored carefully  while you are receiving this medicine. You will need regular blood work. Tell your doctor or healthcare professional if your symptoms do not start to get better or if they get worse. You may have sudden breakdown of your red blood cells after you stop taking this medicine. You will need to be followed by your doctor for 8 weeks or more after this therapy is complete. This medicine may decrease your body's ability to fight infection. Avoid being around people who are sick. Carry the Patient Safety Card given to you at all times. Seek medical help if you have any of the symptoms listed on the card. What side effects may I notice from receiving this medicine? Side effects that you should report to your doctor or health care professional as soon as possible: -allergic reactions like skin rash, itching or hives, swelling of the face, lips, or tongue -back pain -breathing problems -bruising, pinpoint red spots on the skin -confusion -eyes sensitive to light -fast, irregular heartbeat -fever, chills, or any other sign of infection -pain, swelling, warmth in the leg -pain, tingling, numbness in the hands or feet -problems with balance, talking, walking -seizures -stiff neck -unusually weak or tired -vomiting Side effects that usually do not require medical attention (report to your doctor or health care professional if they continue or are bothersome): -aches and pains -constipation -headache -nausea -runny nose or colds -sore throat This list may not describe all possible side effects. Call your doctor for medical advice about side effects. You may report side effects to FDA at 1-800-FDA-1088. Where should I keep my medicine? This drug is  given in a hospital or clinic and will not be stored at home. NOTE: This sheet is a summary. It may not cover all possible information. If you have questions about this medicine, talk to your doctor, pharmacist, or health care provider.  2015,  [employer]vier/Gold Standard. (2008-06-12 17:23:14)

## 2014-11-21 NOTE — Progress Notes (Signed)
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   PRIOR THERAPY:  1) Tapered dose of prednisone. 2) high dose of IVIG   CURRENT THERAPY: Soliris (Eculizumab) induction with 600 mg IV weekly 4 weeks followed by maintenance treatment at a dose of 900 mg IV every 2 weeks. First dose of treatment is expected on 11/21/2014.  INTERVAL HISTORY: Travis Mcdaniel 44 y.o. male returns to the clinic today for follow-up visit and to start the first dose of treatment with Soliris. The patient is very anxious about his condition. He is feeling fine except for occasional back pain and spasm. He denied having any significant fever or chills. He has no nausea or vomiting. He denied having any significant chest pain, shortness breath, cough or hemoptysis. He has no significant weight loss or night sweats.  MEDICAL HISTORY: Past Medical History  Diagnosis Date  . Diverticulosis     ALLERGIES:  has No Known Allergies.  MEDICATIONS:  Current Outpatient Prescriptions  Medication Sig Dispense Refill  . doxycycline (VIBRAMYCIN) 50 MG capsule Take 2 capsules (100 mg total) by mouth 2 (two) times daily. 40 capsule 0  . LORazepam (ATIVAN) 0.5 MG tablet Take 1 tablet (0.5 mg total) by mouth at bedtime as needed for anxiety. 30 tablet 0  . Multiple Vitamin (MULTIVITAMIN WITH MINERALS) TABS tablet Take 1 tablet by mouth daily.    . predniSONE (DELTASONE) 20 MG tablet Take 2 tablets (40 mg total) by mouth daily with breakfast. 30 tablet 0   No current facility-administered medications for this visit.    SURGICAL HISTORY: No past surgical history on file.  REVIEW OF SYSTEMS:  Constitutional: negative Eyes: negative Ears, nose, mouth, throat, and  face: negative Respiratory: negative Cardiovascular: negative Gastrointestinal: negative Genitourinary:negative Integument/breast: negative Hematologic/lymphatic: negative Musculoskeletal:positive for back pain Neurological: negative Behavioral/Psych: positive for anxiety Endocrine: negative Allergic/Immunologic: negative   PHYSICAL EXAMINATION: General appearance: alert, cooperative and no distress Head: Normocephalic, without obvious abnormality, atraumatic Neck: no adenopathy, no JVD, supple, symmetrical, trachea midline and thyroid not enlarged, symmetric, no tenderness/mass/nodules Lymph nodes: Cervical, supraclavicular, and axillary nodes normal. Resp: clear to auscultation bilaterally Back: symmetric, no curvature. ROM normal. No CVA tenderness. Cardio: regular rate and rhythm, S1, S2 normal, no murmur, click, rub or gallop GI: soft, non-tender; bowel sounds normal; no masses,  no organomegaly Extremities: extremities normal, atraumatic, no cyanosis or edema Neurologic: Alert and oriented X 3, normal strength and tone. Normal symmetric reflexes. Normal coordination and gait  ECOG PERFORMANCE STATUS: 1 - Symptomatic but completely ambulatory  Blood pressure 184/79, pulse 69, temperature 97.7 F (36.5 C), temperature source Oral, resp. rate 19, height _0  (1.676 m), weight 157 lb (71.215 kg), SpO2 100 %.  LABORATORY DATA: Lab Results  Component Value Date   WBC 9.3 11/21/2014   HGB 13.6 11/21/2014   HCT 42.3 11/21/2014   MCV 93.8 11/21/2014   PLT 138* 11/21/2014      Chemistry      Component Value Date/Time   NA 142 11/12/2014 1434   NA 140 09/07/2014 0407   K 4.5 11/12/2014 1434   K 3.9 09/07/2014 0407   CL 110 09/07/2014 0407   CO2 26 11/12/2014 1434   CO2 22 09/07/2014 0407   BUN 18.3  11/12/2014 1434   BUN 12 09/07/2014 0407   CREATININE 1.1 11/12/2014 1434   CREATININE 0.99 09/07/2014 0407      Component Value Date/Time   CALCIUM 9.0 11/12/2014 1434     CALCIUM 8.3* 09/07/2014 0407   ALKPHOS 71 11/12/2014 1434   ALKPHOS 30* 09/07/2014 0407   AST 63* 11/12/2014 1434   AST 120* 09/07/2014 0407   ALT 28 11/12/2014 1434   ALT 27 09/07/2014 0407   BILITOT 2.20* 11/12/2014 1434   BILITOT 0.7 09/07/2014 0407       RADIOGRAPHIC STUDIES: No results found.  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 He is here today to start the first cycle of treatment with Soliris.  We will arrange for the patient to receive meningitis vaccine today. I will also start the patient on Cipro 500 mg by mouth twice a day for the next 2 weeks. I also discussed with the patient proceeding with the bone marrow biopsy and aspirate to rule out any bone marrow abnormalities. We will continue to monitor the patient closely during his treatment. He would come back for follow-up visit in one week for reevaluation. I provided the patient with information in hand outs about PNH and Soliris. The patient was advised to call immediately if he has any concerning symptoms in the interval. The patient voices understanding of current disease status and treatment options and is in agreement with the current care plan.  All questions were answered. The patient knows to call the clinic with any problems, questions or concerns. We can certainly see the patient much sooner if necessary.  I spent 15 minutes counseling the patient face to face. The total time spent in the appointment was 25 minutes.  Disclaimer: This note was dictated with voice recognition software. Similar sounding words can inadvertently be transcribed and may not be corrected upon review.

## 2014-11-21 NOTE — Telephone Encounter (Signed)
Pt confirmed labs/ov per 05/25 POF, gave pt AVS and Calendar.... KJ, sent msg to add chemo and pt request to move on Friday's due to work schedule once he starts every other week.[name] KitchenMarland Kitchen

## 2014-11-21 NOTE — Telephone Encounter (Signed)
Per staff message and POF I have scheduled appts. Advised scheduler of appts. JMW  

## 2014-11-21 NOTE — Patient Instructions (Signed)
[facility] Discharge Instructions for Patients Receiving Chemotherapy  Today you received the following chemotherapy agents:  soliris To help prevent nausea and vomiting after your treatment, we encourage you to take your nausea medication.  Take it as often as prescribed.     If you develop nausea and vomiting that is not controlled by your nausea medication, call the clinic. If it is after clinic hours your family physician or the after hours number for the clinic or go to the Emergency Department.   BELOW ARE SYMPTOMS THAT SHOULD BE REPORTED IMMEDIATELY:  *FEVER GREATER THAN 100.5 F  *CHILLS WITH OR WITHOUT FEVER  NAUSEA AND VOMITING THAT IS NOT CONTROLLED WITH YOUR NAUSEA MEDICATION  *UNUSUAL SHORTNESS OF BREATH  *UNUSUAL BRUISING OR BLEEDING  TENDERNESS IN MOUTH AND THROAT WITH OR WITHOUT PRESENCE OF ULCERS  *URINARY PROBLEMS  *BOWEL PROBLEMS  UNUSUAL RASH Items with * indicate a potential emergency and should be followed up as soon as possible.  One of the nurses will contact you 24 hours after your treatment. Please let the nurse know about any problems that you may have experienced. Feel free to call the clinic you have any questions or concerns. The clinic phone number is [phone].   I have been informed and understand all the instructions given to me. I know to contact the clinic, my physician, or go to the Emergency Department if any problems should occur. I do not have any questions at this time, but understand that I may call the clinic during office hours   should I have any questions or need assistance in obtaining follow up care.    __________________________________________  _____________  __________ Signature of Patient or Authorized Representative            Date                   Time    __________________________________________ Nurse's Signature   Eculizumab injection (Soliris) What is this medicine? ECULIZUMAB (ek yoo LYE  zyoo mab) is a monoclonal antibody. It is used to treat a rare kind of anemia called paroxysmal nocturnal hemoglobinuria or PNH. It may help prevent the loss of blood in patients with PNH. This medicine may be used for other purposes; ask your health care provider or pharmacist if you have questions. COMMON BRAND NAME(S): Soliris What should I tell my health care provider before I take this medicine? They need to know if you have any of these conditions: -infection -not received meningococcal vaccine -an unusual or allergic reaction to this eculizumab, other medicines, foods, dyes, or preservatives -pregnant or trying to get pregnant -breast-feeding How should I use this medicine? This medicine is for infusion into a vein. It is given by a health care professional in a hospital or clinic setting. A special MedGuide will be given to you by the pharmacist with each prescription and refill. Be sure to read this information carefully each time. Talk to your pediatrician regarding the use of this medicine in children. Special care may be needed. Overdosage: If you think you have taken too much of this medicine contact a poison control center or emergency room at once. NOTE: This medicine is only for you. Do not share this medicine with others. What if I miss a dose? It is important not to miss your dose. Call your doctor or health care professional if you are unable to keep an appointment. What may interact with this medicine? Interactions are not expected. This  list may not describe all possible interactions. Give your health care provider a list of all the medicines, herbs, non-prescription drugs, or dietary supplements you use. Also tell them if you smoke, drink alcohol, or use illegal drugs. Some items may interact with your medicine. What should I watch for while using this medicine? Your condition will be monitored carefully while you are receiving this medicine. You will need regular blood  work. Tell your doctor or healthcare professional if your symptoms do not start to get better or if they get worse. You may have sudden breakdown of your red blood cells after you stop taking this medicine. You will need to be followed by your doctor for 8 weeks or more after this therapy is complete. This medicine may decrease your body's ability to fight infection. Avoid being around people who are sick. Carry the Patient Safety Card given to you at all times. Seek medical help if you have any of the symptoms listed on the card. What side effects may I notice from receiving this medicine? Side effects that you should report to your doctor or health care professional as soon as possible: -allergic reactions like skin rash, itching or hives, swelling of the face, lips, or tongue -back pain -breathing problems -bruising, pinpoint red spots on the skin -confusion -eyes sensitive to light -fast, irregular heartbeat -fever, chills, or any other sign of infection -pain, swelling, warmth in the leg -pain, tingling, numbness in the hands or feet -problems with balance, talking, walking -seizures -stiff neck -unusually weak or tired -vomiting Side effects that usually do not require medical attention (report to your doctor or health care professional if they continue or are bothersome): -aches and pains -constipation -headache -nausea -runny nose or colds -sore throat This list may not describe all possible side effects. Call your doctor for medical advice about side effects. You may report side effects to FDA at 1-800-FDA-1088. Where should I keep my medicine? This drug is given in a hospital or clinic and will not be stored at home. NOTE: This sheet is a summary. It may not cover all possible information. If you have questions about this medicine, talk to your doctor, pharmacist, or health care provider.  2015, Elsevier/[employer] Standard. (2008-06-12 17:23:14)

## 2014-11-22 ENCOUNTER — Telehealth: Payer: Self-pay | Admitting: *Deleted

## 2014-11-22 NOTE — Telephone Encounter (Signed)
Called pt to discuss PAC. No answer/unable to leave message

## 2014-11-22 NOTE — Telephone Encounter (Signed)
-----   Message from Ardeen Garland, RN sent at 11/22/2014  1:27 PM EDT ----- Regarding: FW: poor venous access Can you call pt about  getting a port and if he wants one please order ----- Message -----    From: Curt Bears, MD    Sent: 11/21/2014   6:49 PM      To: Ardeen Garland, RN Subject: RE: poor venous access                         Yes. Ok for a port. ----- Message -----    From: Ardeen Garland, RN    Sent: 11/21/2014   2:03 PM      To: Lucile Crater, RN, Curt Bears, MD Subject: poor venous access                             He will be coming in every week then every 2 weeks for solaris. What do you think about a port for him? Diane

## 2014-11-22 NOTE — Telephone Encounter (Signed)
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  Also no meningococcal vaccination date was present on the statement of Medical necessity form.  Please call and confirm he's set with everything (vaccine and infusion).  Return number [phone]."  Will notify provider of this request.

## 2014-11-23 ENCOUNTER — Other Ambulatory Visit: Payer: Self-pay | Admitting: Medical Oncology

## 2014-11-23 DIAGNOSIS — D595 Paroxysmal nocturnal hemoglobinuria [Marchiafava-Micheli]: Secondary | ICD-10-CM

## 2014-11-23 NOTE — Telephone Encounter (Signed)
Travis Mcdaniel notified that pt received meningococcal vaccine and started solaris. I told her pt may get a port a cath.

## 2014-11-24 ENCOUNTER — Other Ambulatory Visit: Payer: Self-pay | Admitting: Oncology

## 2014-11-27 ENCOUNTER — Other Ambulatory Visit: Payer: 59

## 2014-11-27 ENCOUNTER — Telehealth: Payer: Self-pay | Admitting: Medical Oncology

## 2014-11-27 NOTE — Telephone Encounter (Signed)
-----   Message from Ardeen Garland, RN sent at 11/23/2014  2:12 PM EDT ----- Regarding: port  call pt

## 2014-11-28 ENCOUNTER — Other Ambulatory Visit (HOSPITAL_BASED_OUTPATIENT_CLINIC_OR_DEPARTMENT_OTHER): Payer: 59

## 2014-11-28 ENCOUNTER — Ambulatory Visit (HOSPITAL_BASED_OUTPATIENT_CLINIC_OR_DEPARTMENT_OTHER): Payer: 59

## 2014-11-28 ENCOUNTER — Telehealth: Payer: Self-pay | Admitting: *Deleted

## 2014-11-28 VITALS — BP 175/86 | HR 83 | Temp 98.2°F | Resp 16

## 2014-11-28 DIAGNOSIS — D595 Paroxysmal nocturnal hemoglobinuria [Marchiafava-Micheli]: Secondary | ICD-10-CM

## 2014-11-28 DIAGNOSIS — D598 Other acquired hemolytic anemias: Secondary | ICD-10-CM

## 2014-11-28 DIAGNOSIS — Z5112 Encounter for antineoplastic immunotherapy: Secondary | ICD-10-CM | POA: Diagnosis not present

## 2014-11-28 LAB — CBC WITH DIFFERENTIAL/PLATELET
BASO%: 0.2 % (ref 0.0–2.0)
Basophils Absolute: 0 10*3/uL (ref 0.0–0.1)
EOS%: 0.7 % (ref 0.0–7.0)
Eosinophils Absolute: 0.1 10*3/uL (ref 0.0–0.5)
HCT: 44.3 % (ref 38.4–49.9)
HEMOGLOBIN: 14.1 g/dL (ref 13.0–17.1)
LYMPH%: 29.3 % (ref 14.0–49.0)
MCH: 30.3 pg (ref 27.2–33.4)
MCHC: 31.8 g/dL — ABNORMAL LOW (ref 32.0–36.0)
MCV: 95.3 fL (ref 79.3–98.0)
MONO#: 0.7 10*3/uL (ref 0.1–0.9)
MONO%: 8.1 % (ref 0.0–14.0)
NEUT#: 5.4 10*3/uL (ref 1.5–6.5)
NEUT%: 61.7 % (ref 39.0–75.0)
PLATELETS: 124 10*3/uL — AB (ref 140–400)
RBC: 4.65 10*6/uL (ref 4.20–5.82)
RDW: 16 % — AB (ref 11.0–14.6)
WBC: 8.7 10*3/uL (ref 4.0–10.3)
lymph#: 2.6 10*3/uL (ref 0.9–3.3)

## 2014-11-28 LAB — COMPREHENSIVE METABOLIC PANEL (CC13)
ALT: 14 U/L (ref 0–55)
AST: 15 U/L (ref 5–34)
Albumin: 4 g/dL (ref 3.5–5.0)
Alkaline Phosphatase: 56 U/L (ref 40–150)
Anion Gap: 12 mEq/L — ABNORMAL HIGH (ref 3–11)
BUN: 13.7 mg/dL (ref 7.0–26.0)
CO2: 26 mEq/L (ref 22–29)
Calcium: 9.3 mg/dL (ref 8.4–10.4)
Chloride: 104 mEq/L (ref 98–109)
Creatinine: 0.9 mg/dL (ref 0.7–1.3)
EGFR: >90 mL/min/{1.73_m2} (ref >90)
GLUCOSE: 102 mg/dL (ref 70–140)
Potassium: 3.5 mEq/L (ref 3.5–5.1)
Sodium: 143 mEq/L (ref 136–145)
Total Bilirubin: 1.52 mg/dL — ABNORMAL HIGH (ref 0.20–1.20)
Total Protein: 6.8 g/dL (ref 6.4–8.3)

## 2014-11-28 MED ORDER — SODIUM CHLORIDE 0.9 % IV SOLN
600.0000 mg | Freq: Once | INTRAVENOUS | Status: AC
  Administered 2014-11-28: 600 mg via INTRAVENOUS
  Filled 2014-11-28: qty 60

## 2014-11-28 MED ORDER — SODIUM CHLORIDE 0.9 % IV SOLN
Freq: Once | INTRAVENOUS | Status: AC
  Administered 2014-11-28: 09:00:00 via INTRAVENOUS

## 2014-11-28 NOTE — Telephone Encounter (Signed)
Pt stopped Probation officer in the lobby and asked for information regarding a refill on his prednisone. Pt states he takes 2 Prednisone 20mg  tablets daily and only received a 15 day supply last time. He states he thought he was to decrease to 20mg  daily starting this week. Please advise refill and plan. Pt will be in infusion today for his second Soliris treatment.

## 2014-11-28 NOTE — Patient Instructions (Signed)
[facility] Discharge Instructions for Patients Receiving Chemotherapy  Today you received the following chemotherapy agents solaris   To help prevent nausea and vomiting after your treatment, we encourage you to take your nausea medication as directed   If you develop nausea and vomiting that is not controlled by your nausea medication, call the clinic.   BELOW ARE SYMPTOMS THAT SHOULD BE REPORTED IMMEDIATELY:  *FEVER GREATER THAN 100.5 F  *CHILLS WITH OR WITHOUT FEVER  NAUSEA AND VOMITING THAT IS NOT CONTROLLED WITH YOUR NAUSEA MEDICATION  *UNUSUAL SHORTNESS OF BREATH  *UNUSUAL BRUISING OR BLEEDING  TENDERNESS IN MOUTH AND THROAT WITH OR WITHOUT PRESENCE OF ULCERS  *URINARY PROBLEMS  *BOWEL PROBLEMS  UNUSUAL RASH Items with * indicate a potential emergency and should be followed up as soon as possible.  Feel free to call the clinic you have any questions or concerns. The clinic phone number is [phone].

## 2014-11-29 ENCOUNTER — Other Ambulatory Visit: Payer: Self-pay | Admitting: Radiology

## 2014-11-29 ENCOUNTER — Other Ambulatory Visit: Payer: Self-pay | Admitting: Physician Assistant

## 2014-11-30 ENCOUNTER — Ambulatory Visit (HOSPITAL_COMMUNITY)
Admission: RE | Admit: 2014-11-30 | Discharge: 2014-11-30 | Disposition: A | Discharge status: Home / Self Care | Payer: 59 | Source: Ambulatory Visit | Attending: Internal Medicine | Admitting: Internal Medicine

## 2014-11-30 ENCOUNTER — Encounter (HOSPITAL_COMMUNITY): Payer: Self-pay

## 2014-11-30 DIAGNOSIS — D595 Paroxysmal nocturnal hemoglobinuria [Marchiafava-Micheli]: Secondary | ICD-10-CM | POA: Insufficient documentation

## 2014-11-30 DIAGNOSIS — D7589 Other specified diseases of blood and blood-forming organs: Secondary | ICD-10-CM | POA: Diagnosis not present

## 2014-11-30 DIAGNOSIS — D696 Thrombocytopenia, unspecified: Secondary | ICD-10-CM | POA: Insufficient documentation

## 2014-11-30 LAB — PROTIME-INR
INR: 0.99 (ref 0.00–1.49)
Prothrombin Time: 13.3 seconds (ref 11.6–15.2)

## 2014-11-30 LAB — CBC WITH DIFFERENTIAL/PLATELET
BASOS PCT: 0 % (ref 0–1)
Basophils Absolute: 0 10*3/uL (ref 0.0–0.1)
Eosinophils Absolute: 0.1 10*3/uL (ref 0.0–0.7)
Eosinophils Relative: 1 % (ref 0–5)
HEMATOCRIT: 42.6 % (ref 39.0–52.0)
Hemoglobin: 13.5 g/dL (ref 13.0–17.0)
Lymphocytes Relative: 16 % (ref 12–46)
Lymphs Abs: 1 10*3/uL (ref 0.7–4.0)
MCH: 30.2 pg (ref 26.0–34.0)
MCHC: 31.7 g/dL (ref 30.0–36.0)
MCV: 95.3 fL (ref 78.0–100.0)
Monocytes Absolute: 0.5 10*3/uL (ref 0.1–1.0)
Monocytes Relative: 8 % (ref 3–12)
Neutro Abs: 4.6 10*3/uL (ref 1.7–7.7)
Neutrophils Relative %: 75 % (ref 43–77)
Platelets: 104 10*3/uL — ABNORMAL LOW (ref 150–400)
RBC: 4.47 MIL/uL (ref 4.22–5.81)
RDW: 15.7 % — ABNORMAL HIGH (ref 11.5–15.5)
WBC: 6.2 10*3/uL (ref 4.0–10.5)

## 2014-11-30 LAB — BONE MARROW EXAM

## 2014-11-30 LAB — APTT: APTT: 29 s (ref 24–37)

## 2014-11-30 MED ORDER — MIDAZOLAM HCL 2 MG/2ML IJ SOLN
INTRAMUSCULAR | Status: AC | PRN
  Administered 2014-11-30 (×2): 2 mg via INTRAVENOUS
  Administered 2014-11-30: 1 mg via INTRAVENOUS

## 2014-11-30 MED ORDER — FENTANYL CITRATE (PF) 100 MCG/2ML IJ SOLN
INTRAMUSCULAR | Status: AC | PRN
  Administered 2014-11-30: 25 ug via INTRAVENOUS
  Administered 2014-11-30: 50 ug via INTRAVENOUS
  Administered 2014-11-30: 25 ug via INTRAVENOUS

## 2014-11-30 MED ORDER — SODIUM CHLORIDE 0.9 % IV SOLN
INTRAVENOUS | Status: DC
  Administered 2014-11-30: 07:00:00 via INTRAVENOUS

## 2014-11-30 MED ORDER — FENTANYL CITRATE (PF) 100 MCG/2ML IJ SOLN
INTRAMUSCULAR | Status: AC
  Filled 2014-11-30: qty 4

## 2014-11-30 MED ORDER — MIDAZOLAM HCL 2 MG/2ML IJ SOLN
INTRAMUSCULAR | Status: AC
  Filled 2014-11-30: qty 6

## 2014-11-30 NOTE — Procedures (Signed)
Interventional Radiology Procedure Note  Procedure: CT guided aspirate and core biopsy of right iliac bone Complications: None Recommendations: - Bedrest supine x 1 hr - Follow biopsy results  Deveney Bayon T. Rece Zechman, M.D Pager:  [phone]   

## 2014-11-30 NOTE — Discharge Instructions (Signed)
Conscious Sedation Sedation is the use of medicines to promote relaxation and relieve discomfort and anxiety. Conscious sedation is a type of sedation. Under conscious sedation you are less alert than normal but are still able to respond to instructions or stimulation. Conscious sedation is used during short medical and dental procedures. It is milder than deep sedation or general anesthesia and allows you to return to your regular activities sooner.  LET [facility] CARE PROVIDER KNOW ABOUT:   Any allergies you have.  All medicines you are taking, including vitamins, herbs, eye drops, creams, and over-the-counter medicines.  Use of steroids (by mouth or creams).  Previous problems you or members of your family have had with the use of anesthetics.  Any blood disorders you have.  Previous surgeries you have had.  Medical conditions you have.  Possibility of pregnancy, if this applies.  Use of cigarettes, alcohol, or illegal drugs. RISKS AND COMPLICATIONS Generally, this is a safe procedure. However, as with any procedure, problems can occur. Possible problems include:  Oversedation.  Trouble breathing on your own. You may need to have a breathing tube until you are awake and breathing on your own.  Allergic reaction to any of the medicines used for the procedure. BEFORE THE PROCEDURE  You may have blood tests done. These tests can help show how well your kidneys and liver are working. They can also show how well your blood clots.  A physical exam will be done.  Only take medicines as directed by your health care provider. You may need to stop taking medicines (such as blood thinners, aspirin, or nonsteroidal anti-inflammatory drugs) before the procedure.   Do not eat or drink at least 6 hours before the procedure or as directed by your health care provider.  Arrange for a responsible adult, family member, or friend to take you home after the procedure. He or she should stay  with you for at least 24 hours after the procedure, until the medicine has worn off. PROCEDURE   An intravenous (IV) catheter will be inserted into one of your veins. Medicine will be able to flow directly into your body through this catheter. You may be given medicine through this tube to help prevent pain and help you relax.  The medical or dental procedure will be done. AFTER THE PROCEDURE  You will stay in a recovery area until the medicine has worn off. Your blood pressure and pulse will be checked.   Depending on the procedure you had, you may be allowed to go home when you can tolerate liquids and your pain is under control. Document Released: 03/10/2001 Document Revised: 06/20/2013 Document Reviewed: 02/20/2013 [facility] Patient Information 2015 [place], [place]. This information is not intended to replace advice given to you by your health care provider. Make sure you discuss any questions you have with your health care provider.  Conscious Sedation, Adult, Care After Refer to this sheet in the next few weeks. These instructions provide you with information on caring for yourself after your procedure. Your health care provider may also give you more specific instructions. Your treatment has been planned according to current medical practices, but problems sometimes occur. Call your health care provider if you have any problems or questions after your procedure. WHAT TO EXPECT AFTER THE PROCEDURE  After your procedure:  You may feel sleepy, clumsy, and have poor balance for several hours.  Vomiting may occur if you eat too soon after the procedure. HOME CARE INSTRUCTIONS  Do not  participate in any activities where you could become injured for at least 24 hours. Do not:  Drive.  Swim.  Ride a bicycle.  Operate heavy machinery.  Cook.  Use power tools.  Climb ladders.  Work from a high place.  Do not make important decisions or sign legal documents until you are  improved.  If you vomit, drink water, juice, or soup when you can drink without vomiting. Make sure you have little or no nausea before eating solid foods.  Only take over-the-counter or prescription medicines for pain, discomfort, or fever as directed by your health care provider.  Make sure you and your family fully understand everything about the medicines given to you, including what side effects may occur.  You should not drink alcohol, take sleeping pills, or take medicines that cause drowsiness for at least 24 hours.  If you smoke, do not smoke without supervision.  If you are feeling better, you may resume normal activities 24 hours after you were sedated.  Keep all appointments with your health care provider. SEEK MEDICAL CARE IF:  Your skin is pale or bluish in color.  You continue to feel nauseous or vomit.  Your pain is getting worse and is not helped by medicine.  You have bleeding or swelling.  You are still sleepy or feeling clumsy after 24 hours. SEEK IMMEDIATE MEDICAL CARE IF:  You develop a rash.  You have difficulty breathing.  You develop any type of allergic problem.  You have a fever. MAKE SURE YOU:  Understand these instructions.  Will watch your condition.  Will get help right away if you are not doing well or get worse. Document Released: 04/05/2013 Document Reviewed: 04/05/2013 [facility] Patient Information 2015 [place], [place]. This information is not intended to replace advice given to you by your health care provider. Make sure you discuss any questions you have with your health care provider.  Bone Marrow Aspiration, Bone Marrow Biopsy Care After Read the instructions outlined below and refer to this sheet in the next few weeks. These discharge instructions provide you with general information on caring for yourself after you leave the hospital. Your caregiver may also give you specific instructions. While your treatment has been planned  according to the most current medical practices available, unavoidable complications occasionally occur. If you have any problems or questions after discharge, call your caregiver. FINDING OUT THE RESULTS OF YOUR TEST Not all test results are available during your visit. If your test results are not back during the visit, make an appointment with your caregiver to find out the results. Do not assume everything is normal if you have not heard from your caregiver or the medical facility. It is important for you to follow up on all of your test results.  HOME CARE INSTRUCTIONS  You have had sedation and may be sleepy or dizzy. Your thinking may not be as clear as usual. For the next 24 hours:  Only take over-the-counter or prescription medicines for pain, discomfort, and or fever as directed by your caregiver.  Do not drink alcohol.  Do not smoke.  Do not drive.  Do not make important legal decisions.  Do not operate heavy machinery.  Do not care for small children by yourself.  Keep your dressing clean and dry. You may replace dressing with a bandage after 24 hours.  You may take a bath or shower after 24 hours.  Use an ice pack for 20 minutes every 2 hours while awake for  pain as needed. SEEK MEDICAL CARE IF:   There is redness, swelling, or increasing pain at the biopsy site.  There is pus coming from the biopsy site.  There is drainage from a biopsy site lasting longer than one day.  An unexplained oral temperature above 102 F (38.9 C) develops. SEEK IMMEDIATE MEDICAL CARE IF:   You develop a rash.  You have difficulty breathing.  You develop any reaction or side effects to medications given. Document Released: 01/02/2005 Document Revised: 09/07/2011 Document Reviewed: 06/12/2008 [facility] Patient Information 2015 [place], [place]. This information is not intended to replace advice given to you by your health care provider. Make sure you discuss any questions you have with  your health care provider.

## 2014-11-30 NOTE — H&P (Signed)
Chief Complaint: "I'm getting a bone marrow biopsy"  Referring Physician(s): Mohamed,Mohamed  History of Present Illness: Travis Mcdaniel is a 44 y.o. 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  Past Medical History  Diagnosis Date  . Diverticulosis     History reviewed. No pertinent past surgical history.  Allergies: Review of patient's allergies indicates no known allergies.  Medications: Prior to Admission medications   Medication Sig Start Date End Date Taking? Authorizing Provider  ciprofloxacin (CIPRO) 500 MG tablet Take 1 tablet (500 mg total) by mouth 2 (two) times daily. 11/21/14  Yes Curt Bears, MD  LORazepam (ATIVAN) 0.5 MG tablet Take 1 tablet (0.5 mg total) by mouth at bedtime as needed for anxiety. 11/12/14  Yes Maryanna Shape, NP  Multiple Vitamin (MULTIVITAMIN WITH MINERALS) TABS tablet Take 1 tablet by mouth daily.   Yes Historical Provider, MD  doxycycline (VIBRAMYCIN) 50 MG capsule Take 2 capsules (100 mg total) by mouth 2 (two) times daily. Patient not taking: Reported on 11/29/2014 10/23/14   Susanne Borders, NP  predniSONE (DELTASONE) 20 MG tablet Take 2 tablets (40 mg total) by mouth daily with breakfast. Patient not taking: Reported on 11/29/2014 11/12/14   Maryanna Shape, NP    Family History  Problem Relation Age of Onset  . Alcohol abuse Brother   . Cirrhosis Brother     deceased    History   Social History  . Marital Status: Single    Spouse Name: N/A  . Number of Children: N/A  . Years of Education: N/A   Social History Main Topics  . Smoking status: Never Smoker   . Smokeless tobacco: Not on file  . Alcohol Use: No  . Drug Use: Not on file  . Sexual Activity: Not on file   Other Topics Concern  . None   Social History Narrative      Review of Systems  Constitutional: Negative for  fever and chills.  Respiratory: Negative for cough and shortness of breath.   Cardiovascular: Negative for chest pain.  Gastrointestinal: Negative for nausea, vomiting, abdominal pain and blood in stool.  Genitourinary: Negative for dysuria and hematuria.  Musculoskeletal: Negative for back pain.  Neurological: Negative for headaches.  Hematological: Does not bruise/bleed easily.  Psychiatric/Behavioral: The patient is nervous/anxious.     Vital Signs: BP 173/95 mmHg  Pulse 92  Temp(Src) 98.2 F (36.8 C) (Oral)  Resp 20  SpO2 100%  Physical Exam  Constitutional: He is oriented to person, place, and time. He appears well-developed and well-nourished.  Cardiovascular: Normal rate and regular rhythm.   Pulmonary/Chest: Effort normal and breath sounds normal.  Abdominal: Soft. Bowel sounds are normal. There is no tenderness.  Musculoskeletal: Normal range of motion. He exhibits no edema.  Neurological: He is alert and oriented to person, place, and time.    Imaging: No results found.  Labs:  CBC:  Recent Labs  11/12/14 1434 11/21/14 0919 11/28/14 0805 11/30/14 0725  WBC 11.1* 9.3 8.7 6.2  HGB 12.1* 13.6 14.1 13.5  HCT 38.5 42.3 44.3 42.6  PLT 148 138* 124* PENDING    COAGS:  Recent Labs  09/06/14 0916 11/30/14 0725  INR 0.94 0.99  APTT 29 29    BMP:  Recent Labs  09/05/14 1438 09/05/14 1540 09/06/14 0655 09/07/14 0407  10/30/14 0806 11/12/14 1434 11/21/14 0919 11/28/14 0805  NA 134*  135 137 140  < > 141 142 141 143  K 4.0 4.2 3.7 3.9  < > 3.5 4.5 4.1 3.5  CL 104 102 111 110  --   --   --   --   --   CO2 21  --  20 22  < > _0 GLUCOSE 131* 128* 120* 103*  < > 113 127 105 102  BUN 23 28* 17 12  < > 13.4 18.3 15.2 13.7  CALCIUM 8.7  --  8.3* 8.3*  < > 9.2 9.0 9.2 9.3  CREATININE 1.44* 1.80* 1.14 0.99  < > 0.8 1.1 0.9 0.9  GFRNONAA 57*  --  76* >90  --   --   --   --   --   GFRAA 66*  --  88* >90  --   --   --   --   --   < > = values in  this interval not displayed.  LIVER FUNCTION TESTS:  Recent Labs  10/30/14 0806 11/12/14 1434 11/21/14 0919 11/28/14 0805  BILITOT 0.65 2.20* 1.59* 1.52*  AST 26 63* 44* 15  ALT _1 ALKPHOS 77 71 60 56  PROT 7.1 6.9 6.9 6.8  ALBUMIN 3.8 3.9 4.1 4.0    TUMOR MARKERS: No results for input(s): AFPTM, CEA, CA199, CHROMGRNA in the last 8760 hours.  Assessment and Plan: [name] is a 44 y.o. 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 Risks and benefits discussed with the patient including, but not limited to bleeding, infection, damage to adjacent structures or low yield requiring additional tests. All of the patient's questions were answered, patient is agreeable to proceed. Consent signed and in chart.     Signed: D. Rowe Robert 11/30/2014, 8:34 AM   I spent a total of 20 minutes face to face in clinical consultation, greater than 50% of which was counseling/coordinating care for CT-guided bone marrow biopsy

## 2014-12-04 ENCOUNTER — Other Ambulatory Visit: Payer: 59

## 2014-12-05 ENCOUNTER — Ambulatory Visit (HOSPITAL_BASED_OUTPATIENT_CLINIC_OR_DEPARTMENT_OTHER): Payer: 59

## 2014-12-05 ENCOUNTER — Encounter: Payer: Self-pay | Admitting: Oncology

## 2014-12-05 ENCOUNTER — Other Ambulatory Visit (HOSPITAL_BASED_OUTPATIENT_CLINIC_OR_DEPARTMENT_OTHER): Payer: 59

## 2014-12-05 ENCOUNTER — Ambulatory Visit (HOSPITAL_BASED_OUTPATIENT_CLINIC_OR_DEPARTMENT_OTHER): Payer: 59 | Admitting: Oncology

## 2014-12-05 VITALS — BP 162/78 | HR 97 | Temp 98.6°F | Resp 18 | Ht 66.0 in | Wt 159.3 lb

## 2014-12-05 DIAGNOSIS — Z5112 Encounter for antineoplastic immunotherapy: Secondary | ICD-10-CM | POA: Diagnosis not present

## 2014-12-05 DIAGNOSIS — D595 Paroxysmal nocturnal hemoglobinuria [Marchiafava-Micheli]: Secondary | ICD-10-CM

## 2014-12-05 DIAGNOSIS — D6959 Other secondary thrombocytopenia: Secondary | ICD-10-CM

## 2014-12-05 LAB — COMPREHENSIVE METABOLIC PANEL (CC13)
ALBUMIN: 3.7 g/dL (ref 3.5–5.0)
ALK PHOS: 55 U/L (ref 40–150)
ALT: 13 U/L (ref 0–55)
AST: 21 U/L (ref 5–34)
Anion Gap: 11 mEq/L (ref 3–11)
BUN: 10.4 mg/dL (ref 7.0–26.0)
CO2: 26 meq/L (ref 22–29)
Calcium: 9.4 mg/dL (ref 8.4–10.4)
Chloride: 105 mEq/L (ref 98–109)
Creatinine: 0.8 mg/dL (ref 0.7–1.3)
GLUCOSE: 134 mg/dL (ref 70–140)
Potassium: 3.7 mEq/L (ref 3.5–5.1)
SODIUM: 142 meq/L (ref 136–145)
TOTAL PROTEIN: 6.7 g/dL (ref 6.4–8.3)
Total Bilirubin: 1.87 mg/dL — ABNORMAL HIGH (ref 0.20–1.20)

## 2014-12-05 LAB — CBC WITH DIFFERENTIAL/PLATELET
BASO%: 0.2 % (ref 0.0–2.0)
Basophils Absolute: 0 10*3/uL (ref 0.0–0.1)
EOS%: 1.2 % (ref 0.0–7.0)
Eosinophils Absolute: 0.1 10*3/uL (ref 0.0–0.5)
HCT: 40.2 % (ref 38.4–49.9)
HGB: 13.1 g/dL (ref 13.0–17.1)
LYMPH%: 22.7 % (ref 14.0–49.0)
MCH: 31.3 pg (ref 27.2–33.4)
MCHC: 32.6 g/dL (ref 32.0–36.0)
MCV: 96.2 fL (ref 79.3–98.0)
MONO#: 0.6 10*3/uL (ref 0.1–0.9)
MONO%: 8.5 % (ref 0.0–14.0)
NEUT#: 4.3 10*3/uL (ref 1.5–6.5)
NEUT%: 67.4 % (ref 39.0–75.0)
Platelets: 123 10*3/uL — ABNORMAL LOW (ref 140–400)
RBC: 4.18 10*6/uL — ABNORMAL LOW (ref 4.20–5.82)
RDW: 16.9 % — ABNORMAL HIGH (ref 11.0–14.6)
WBC: 6.4 10*3/uL (ref 4.0–10.3)
lymph#: 1.5 10*3/uL (ref 0.9–3.3)

## 2014-12-05 LAB — LACTATE DEHYDROGENASE (CC13): LDH: 283 U/L — ABNORMAL HIGH (ref 125–245)

## 2014-12-05 MED ORDER — ECULIZUMAB 300 MG/30ML IV SOLN
600.0000 mg | Freq: Once | INTRAVENOUS | Status: AC
  Administered 2014-12-05: 600 mg via INTRAVENOUS
  Filled 2014-12-05: qty 60

## 2014-12-05 MED ORDER — SODIUM CHLORIDE 0.9 % IV SOLN
Freq: Once | INTRAVENOUS | Status: AC
  Administered 2014-12-05: 09:00:00 via INTRAVENOUS

## 2014-12-05 NOTE — Progress Notes (Signed)
Bili trending up; no dosage adjustment required for Soliris per Quita Skye, PharmD.

## 2014-12-05 NOTE — Progress Notes (Signed)
[facility] [phone]   [phone]  OFFICE PROGRESS NOTE  No PCP Per Patient No address on file  DIAGNOSIS: Recently diagnosed paroxysmal nocturnal hemoglobinuria presented initially as Hemolytic anemia thought to be secondary to infection with influenza A and H1N1.   PRIOR THERAPY:  1) Tapered dose of prednisone. 2) high dose of IVIG   CURRENT THERAPY: Soliris (Eculizumab) induction with 600 mg IV weekly 4 weeks followed by maintenance treatment at a dose of 900 mg IV every 2 weeks. First dose of treatment is expected on 11/21/2014.  INTERVAL HISTORY: Travis Mcdaniel 44 y.o. male returns to the clinic today for follow-up visit. He is due for his third weekly dose of Soliris. He reports that he is not feeling well on this treatment and has no complaints today. He continues to use to be able to work full time. He denied having any significant fever or chills. He has no nausea or vomiting. He denied having any significant chest pain, shortness breath, cough or hemoptysis. Denies hematuria. He has no significant weight loss or night sweats.  MEDICAL HISTORY: Past Medical History  Diagnosis Date  . Diverticulosis     ALLERGIES:  has No Known Allergies.  MEDICATIONS:  Current Outpatient Prescriptions  Medication Sig Dispense Refill  . ciprofloxacin (CIPRO) 500 MG tablet Take 1 tablet (500 mg total) by mouth 2 (two) times daily. 28 tablet 0  . LORazepam (ATIVAN) 0.5 MG tablet Take 1 tablet (0.5 mg total) by mouth at bedtime as needed for anxiety. 30 tablet 0  . Multiple Vitamin (MULTIVITAMIN WITH MINERALS) TABS tablet Take 1 tablet by mouth daily.     No current facility-administered medications for this visit.    SURGICAL HISTORY: History reviewed. No pertinent past surgical history.  REVIEW OF SYSTEMS:  Constitutional: negative Eyes: negative Ears, nose, mouth, throat, and face: negative Respiratory: negative Cardiovascular:  negative Gastrointestinal: negative Genitourinary:negative Integument/breast: negative Hematologic/lymphatic: negative Musculoskeletal:positive for back pain Neurological: negative Behavioral/Psych: positive for anxiety Endocrine: negative Allergic/Immunologic: negative   PHYSICAL EXAMINATION: General appearance: alert, cooperative and no distress Head: Normocephalic, without obvious abnormality, atraumatic Neck: no adenopathy, no JVD, supple, symmetrical, trachea midline and thyroid not enlarged, symmetric, no tenderness/mass/nodules Lymph nodes: Cervical, supraclavicular, and axillary nodes normal. Resp: clear to auscultation bilaterally Back: symmetric, no curvature. ROM normal. No CVA tenderness. Cardio: regular rate and rhythm, S1, S2 normal, no murmur, click, rub or gallop GI: soft, non-tender; bowel sounds normal; no masses,  no organomegaly Extremities: extremities normal, atraumatic, no cyanosis or edema Neurologic: Alert and oriented X 3, normal strength and tone. Normal symmetric reflexes. Normal coordination and gait  ECOG PERFORMANCE STATUS: 1 - Symptomatic but completely ambulatory  Blood pressure 162/78, pulse 97, temperature 98.6 F (37 C), temperature source Oral, resp. rate 18, height '5\' 6"'  (1.676 m), weight 159 lb 4.8 oz (72.258 kg), SpO2 100 %.  LABORATORY DATA: Lab Results  Component Value Date   WBC 6.4 12/05/2014   HGB 13.1 12/05/2014   HCT 40.2 12/05/2014   MCV 96.2 12/05/2014   PLT 123* 12/05/2014      Chemistry      Component Value Date/Time   NA 142 12/05/2014 0805   NA 140 09/07/2014 0407   K 3.7 12/05/2014 0805   K 3.9 09/07/2014 0407   CL 110 09/07/2014 0407   CO2 26 12/05/2014 0805   CO2 22 09/07/2014 0407   BUN 10.4 12/05/2014 0805   BUN 12 09/07/2014 0407  CREATININE 0.8 12/05/2014 0805   CREATININE 0.99 09/07/2014 0407      Component Value Date/Time   CALCIUM 9.4 12/05/2014 0805   CALCIUM 8.3* 09/07/2014 0407   ALKPHOS 55  12/05/2014 0805   ALKPHOS 30* 09/07/2014 0407   AST 21 12/05/2014 0805   AST 120* 09/07/2014 0407   ALT 13 12/05/2014 0805   ALT 27 09/07/2014 0407   BILITOT 1.87* 12/05/2014 0805   BILITOT 0.7 09/07/2014 0407       RADIOGRAPHIC STUDIES: Ct Biopsy  11/30/2014   CLINICAL DATA:  Paroxysmal nocturnal hemoglobinuria.  Bone  EXAM: CT GUIDED ASPIRATE AND CORE BIOPSY OF RIGHT ILIAC BONE MARROW  ANESTHESIA/SEDATION: Versed 5.0 mg IV, Fentanyl 100 mcg IV  Total Moderate Sedation Time  10 minutes.  PROCEDURE: The procedure risks, benefits, and alternatives were explained to the patient. Questions regarding the procedure were encouraged and answered. The patient understands and consents to the procedure. A time-out was performed prior to the procedure.  The right gluteal region was prepped with Betadine. Sterile gown and sterile gloves were used for the procedure. Local anesthesia was provided with 1% Lidocaine.  Under CT guidance, an 11 gauge OnControl bone cutting needle was advanced from a posterior approach into the right iliac bone. Needle positioning was confirmed with CT. Initial non heparinized and heparinized aspirate samples were obtained of bone marrow.  Core biopsy was performed with the 11 gauge needle. A single core pass was made.  COMPLICATIONS: None  FINDINGS: Inspection of initial aspirate did reveal visible particles. Intact core biopsy sample was obtained.  IMPRESSION: CT guided bone marrow biopsy of right posterior iliac bone with both aspirate and core samples obtained.   Electronically Signed   By: Aletta Edouard M.D.   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 He is here today for his third weekly dose of Soliris.    The patient was seen and examined with Dr. Julien Nordmann today. Recommend that he  proceed with his third dose of Soliris. His labs are stable. Bone marrow biopsy result is negative. These results were discussed with the patient. We will continue to monitor the patient closely during his treatment. He will return in 2 weeks for follow-up which will be when he begins his every 2 week dose of Soliris.  The patient was advised to call immediately if he has any concerning symptoms in the interval. The patient voices understanding of current disease status and treatment options and is in agreement with the current care plan.  All questions were answered. The patient knows to call the clinic with any problems, questions or concerns. We can certainly see the patient much sooner if necessary.  Mikey Bussing, DNP, AGPCNP-BC, AOCNP  ADDENDUM: Hematology/Oncology Attending: I had a face to face encounter with the patient today. I recommended his care plan. This is a very pleasant 44 years old white male recently diagnosed with paroxysmal nocturnal hemoglobinuria and currently undergoing treatment with Soliris is status post 2 weekly doses of treatment and tolerated the treatment fairly well with no significant adverse effects except for mild thrombocytopenia. I recommended for the patient to continue his treatment as scheduled. He would come back for follow-up visit in 2 weeks for reevaluation before starting the 2 weekly schedule of his treatment with Soliris. He was advised to call immediately if  he has any concerning symptoms in the interval.  Disclaimer: This note was dictated with voice recognition software. Similar sounding words can inadvertently be transcribed and may be missed upon review.  Eilleen Kempf., MD 12/05/2014

## 2014-12-05 NOTE — Patient Instructions (Signed)
[facility] Discharge Instructions for Patients Receiving Chemotherapy  Today you received the following chemotherapy agents :  Soliris.  To help prevent nausea and vomiting after your treatment, we encourage you to take your nausea medication as prescribed.   If you develop nausea and vomiting that is not controlled by your nausea medication, call the clinic.   BELOW ARE SYMPTOMS THAT SHOULD BE REPORTED IMMEDIATELY:  *FEVER GREATER THAN 100.5 F  *CHILLS WITH OR WITHOUT FEVER  NAUSEA AND VOMITING THAT IS NOT CONTROLLED WITH YOUR NAUSEA MEDICATION  *UNUSUAL SHORTNESS OF BREATH  *UNUSUAL BRUISING OR BLEEDING  TENDERNESS IN MOUTH AND THROAT WITH OR WITHOUT PRESENCE OF ULCERS  *URINARY PROBLEMS  *BOWEL PROBLEMS  UNUSUAL RASH Items with * indicate a potential emergency and should be followed up as soon as possible.  Feel free to call the clinic you have any questions or concerns. The clinic phone number is [phone].  Please show the CHEMO ALERT CARD at check-in to the Emergency Department and triage nurse.   

## 2014-12-11 ENCOUNTER — Other Ambulatory Visit: Payer: 59

## 2014-12-11 LAB — CHROMOSOME ANALYSIS, BONE MARROW

## 2014-12-12 ENCOUNTER — Telehealth: Payer: Self-pay | Admitting: Oncology

## 2014-12-12 ENCOUNTER — Other Ambulatory Visit (HOSPITAL_BASED_OUTPATIENT_CLINIC_OR_DEPARTMENT_OTHER): Payer: 59

## 2014-12-12 ENCOUNTER — Ambulatory Visit (HOSPITAL_BASED_OUTPATIENT_CLINIC_OR_DEPARTMENT_OTHER): Payer: 59

## 2014-12-12 VITALS — BP 158/89 | HR 83 | Temp 98.9°F | Resp 18

## 2014-12-12 DIAGNOSIS — D595 Paroxysmal nocturnal hemoglobinuria [Marchiafava-Micheli]: Secondary | ICD-10-CM

## 2014-12-12 DIAGNOSIS — Z5112 Encounter for antineoplastic immunotherapy: Secondary | ICD-10-CM

## 2014-12-12 LAB — COMPREHENSIVE METABOLIC PANEL (CC13)
ALT: 14 U/L (ref 0–55)
AST: 16 U/L (ref 5–34)
Albumin: 4 g/dL (ref 3.5–5.0)
Alkaline Phosphatase: 66 U/L (ref 40–150)
Anion Gap: 13 mEq/L — ABNORMAL HIGH (ref 3–11)
BUN: 8.8 mg/dL (ref 7.0–26.0)
CHLORIDE: 105 meq/L (ref 98–109)
CO2: 24 meq/L (ref 22–29)
Calcium: 9.4 mg/dL (ref 8.4–10.4)
Creatinine: 0.9 mg/dL (ref 0.7–1.3)
GLUCOSE: 101 mg/dL (ref 70–140)
Potassium: 3.6 mEq/L (ref 3.5–5.1)
Sodium: 141 mEq/L (ref 136–145)
TOTAL PROTEIN: 6.9 g/dL (ref 6.4–8.3)
Total Bilirubin: 2.89 mg/dL — ABNORMAL HIGH (ref 0.20–1.20)

## 2014-12-12 LAB — CBC WITH DIFFERENTIAL/PLATELET
BASO%: 0.6 % (ref 0.0–2.0)
Basophils Absolute: 0 10*3/uL (ref 0.0–0.1)
EOS%: 2.1 % (ref 0.0–7.0)
Eosinophils Absolute: 0.1 10*3/uL (ref 0.0–0.5)
HEMATOCRIT: 38.8 % (ref 38.4–49.9)
HGB: 12.4 g/dL — ABNORMAL LOW (ref 13.0–17.1)
LYMPH%: 27.3 % (ref 14.0–49.0)
MCH: 32.1 pg (ref 27.2–33.4)
MCHC: 32 g/dL (ref 32.0–36.0)
MCV: 100.5 fL — AB (ref 79.3–98.0)
MONO#: 0.4 10*3/uL (ref 0.1–0.9)
MONO%: 8.6 % (ref 0.0–14.0)
NEUT#: 3.1 10*3/uL (ref 1.5–6.5)
NEUT%: 61.4 % (ref 39.0–75.0)
Platelets: 134 10*3/uL — ABNORMAL LOW (ref 140–400)
RBC: 3.86 10*6/uL — ABNORMAL LOW (ref 4.20–5.82)
RDW: 19.1 % — ABNORMAL HIGH (ref 11.0–14.6)
WBC: 5.1 10*3/uL (ref 4.0–10.3)
lymph#: 1.4 10*3/uL (ref 0.9–3.3)

## 2014-12-12 LAB — LACTATE DEHYDROGENASE (CC13): LDH: 254 U/L — AB (ref 125–245)

## 2014-12-12 MED ORDER — SODIUM CHLORIDE 0.9 % IV SOLN
Freq: Once | INTRAVENOUS | Status: AC
  Administered 2014-12-12: 08:00:00 via INTRAVENOUS

## 2014-12-12 MED ORDER — SODIUM CHLORIDE 0.9 % IV SOLN
600.0000 mg | Freq: Once | INTRAVENOUS | Status: AC
  Administered 2014-12-12: 600 mg via INTRAVENOUS
  Filled 2014-12-12: qty 60

## 2014-12-12 NOTE — Progress Notes (Signed)
Per Dr Julien Nordmann pt may take a 81 mg aspirin.

## 2014-12-12 NOTE — Patient Instructions (Signed)
[facility] Discharge Instructions for Patients Receiving Chemotherapy  Today you received the following chemotherapy agents :  Soliris.  To help prevent nausea and vomiting after your treatment, we encourage you to take your nausea medication as prescribed.   If you develop nausea and vomiting that is not controlled by your nausea medication, call the clinic.   BELOW ARE SYMPTOMS THAT SHOULD BE REPORTED IMMEDIATELY:  *FEVER GREATER THAN 100.5 F  *CHILLS WITH OR WITHOUT FEVER  NAUSEA AND VOMITING THAT IS NOT CONTROLLED WITH YOUR NAUSEA MEDICATION  *UNUSUAL SHORTNESS OF BREATH  *UNUSUAL BRUISING OR BLEEDING  TENDERNESS IN MOUTH AND THROAT WITH OR WITHOUT PRESENCE OF ULCERS  *URINARY PROBLEMS  *BOWEL PROBLEMS  UNUSUAL RASH Items with * indicate a potential emergency and should be followed up as soon as possible.  Feel free to call the clinic you have any questions or concerns. The clinic phone number is [phone].  Please show the CHEMO ALERT CARD at check-in to the Emergency Department and triage nurse.   

## 2014-12-12 NOTE — Telephone Encounter (Signed)
pt cmae by stating sch states an 8:00 appt-appt had been chgd pt stated not contacted-Melissa move inf to coordinate w/MD appt-gave pt updated avs

## 2014-12-12 NOTE — Progress Notes (Signed)
Patient observed for 1 hour post infusion, discharged to lobby ambulatory in no acute distress.

## 2014-12-12 NOTE — Telephone Encounter (Signed)
appts were for 6/22-appt corrected

## 2014-12-19 ENCOUNTER — Ambulatory Visit (HOSPITAL_BASED_OUTPATIENT_CLINIC_OR_DEPARTMENT_OTHER): Payer: 59

## 2014-12-19 ENCOUNTER — Telehealth: Payer: Self-pay | Admitting: Nurse Practitioner

## 2014-12-19 ENCOUNTER — Ambulatory Visit (HOSPITAL_BASED_OUTPATIENT_CLINIC_OR_DEPARTMENT_OTHER): Payer: 59 | Admitting: Nurse Practitioner

## 2014-12-19 ENCOUNTER — Other Ambulatory Visit (HOSPITAL_BASED_OUTPATIENT_CLINIC_OR_DEPARTMENT_OTHER): Payer: 59

## 2014-12-19 VITALS — BP 158/82 | HR 82 | Temp 99.0°F | Resp 18 | Ht 66.0 in | Wt 159.7 lb

## 2014-12-19 DIAGNOSIS — Z5112 Encounter for antineoplastic immunotherapy: Secondary | ICD-10-CM

## 2014-12-19 DIAGNOSIS — D595 Paroxysmal nocturnal hemoglobinuria [Marchiafava-Micheli]: Secondary | ICD-10-CM

## 2014-12-19 DIAGNOSIS — D589 Hereditary hemolytic anemia, unspecified: Secondary | ICD-10-CM

## 2014-12-19 DIAGNOSIS — R030 Elevated blood-pressure reading, without diagnosis of hypertension: Secondary | ICD-10-CM | POA: Diagnosis not present

## 2014-12-19 LAB — COMPREHENSIVE METABOLIC PANEL (CC13)
ALBUMIN: 4.1 g/dL (ref 3.5–5.0)
ALT: 12 U/L (ref 0–55)
AST: 20 U/L (ref 5–34)
Alkaline Phosphatase: 68 U/L (ref 40–150)
Anion Gap: 9 mEq/L (ref 3–11)
BUN: 9.6 mg/dL (ref 7.0–26.0)
CALCIUM: 9.2 mg/dL (ref 8.4–10.4)
CHLORIDE: 106 meq/L (ref 98–109)
CO2: 28 meq/L (ref 22–29)
Creatinine: 0.8 mg/dL (ref 0.7–1.3)
EGFR: >90 mL/min/{1.73_m2} (ref >90)
Glucose: 123 mg/dl (ref 70–140)
POTASSIUM: 3.6 meq/L (ref 3.5–5.1)
Sodium: 143 mEq/L (ref 136–145)
Total Bilirubin: 2.59 mg/dL — ABNORMAL HIGH (ref 0.20–1.20)
Total Protein: 6.8 g/dL (ref 6.4–8.3)

## 2014-12-19 LAB — CBC WITH DIFFERENTIAL/PLATELET
BASO%: 1 % (ref 0.0–2.0)
Basophils Absolute: 0 10*3/uL (ref 0.0–0.1)
EOS%: 2.5 % (ref 0.0–7.0)
Eosinophils Absolute: 0.1 10*3/uL (ref 0.0–0.5)
HCT: 37.2 % — ABNORMAL LOW (ref 38.4–49.9)
HGB: 12.2 g/dL — ABNORMAL LOW (ref 13.0–17.1)
LYMPH%: 28.1 % (ref 14.0–49.0)
MCH: 33.2 pg (ref 27.2–33.4)
MCHC: 32.7 g/dL (ref 32.0–36.0)
MCV: 101.3 fL — AB (ref 79.3–98.0)
MONO#: 0.5 10*3/uL (ref 0.1–0.9)
MONO%: 9.3 % (ref 0.0–14.0)
NEUT%: 59.1 % (ref 39.0–75.0)
NEUTROS ABS: 2.9 10*3/uL (ref 1.5–6.5)
PLATELETS: 162 10*3/uL (ref 140–400)
RBC: 3.67 10*6/uL — ABNORMAL LOW (ref 4.20–5.82)
RDW: 19.4 % — AB (ref 11.0–14.6)
WBC: 4.9 10*3/uL (ref 4.0–10.3)
lymph#: 1.4 10*3/uL (ref 0.9–3.3)

## 2014-12-19 LAB — LACTATE DEHYDROGENASE (CC13): LDH: 254 U/L — AB (ref 125–245)

## 2014-12-19 MED ORDER — ECULIZUMAB 300 MG/30ML IV SOLN
900.0000 mg | Freq: Once | INTRAVENOUS | Status: AC
  Administered 2014-12-19: 900 mg via INTRAVENOUS
  Filled 2014-12-19: qty 90

## 2014-12-19 MED ORDER — SODIUM CHLORIDE 0.9 % IV SOLN
Freq: Once | INTRAVENOUS | Status: AC
  Administered 2014-12-19: 15:00:00 via INTRAVENOUS

## 2014-12-19 MED ORDER — LORAZEPAM 0.5 MG PO TABS: 0.5000 mg | ORAL_TABLET | Freq: Every evening | ORAL | Status: DC | PRN

## 2014-12-19 NOTE — Telephone Encounter (Signed)
per pof to sch pt 7/6-sent Travis Mcdaniel T a staff message to advise no openings-will await reply before sending MW email to sch trmt-adv pt I will call after reply

## 2014-12-19 NOTE — Progress Notes (Signed)
  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   PRIOR THERAPY:  1) Tapered dose of prednisone. 2) high dose of IVIG   CURRENT THERAPY: Soliris (Eculizumab) induction with 600 mg IV weekly 4 weeks followed by maintenance treatment at a dose of 900 mg IV every 2 weeks. First dose of treatment 11/21/2014.   INTERVAL HISTORY:   Travis Mcdaniel returns as scheduled. He completed week 4 soliris on 12/12/2014. He feels well. He has no complaints today. No nausea or vomiting. No diarrhea. No fever or chills. No rash. No bleeding. No cough or shortness of breath.  Objective:  Vital signs in last 24 hours:  Blood pressure 158/82, pulse 82, temperature 99 F (37.2 C), temperature source Oral, resp. rate 18, height 5\' 6"  (1.676 m), weight 159 lb 11.2 oz (72.439 kg), SpO2 100 %.    HEENT: No thrush or ulcers. Resp: Lungs clear bilaterally. Cardio: Regular rate and rhythm. GI: Abdomen soft and nontender. No hepatomegaly. Vascular: No leg edema. Calves soft and nontender. Neuro: Alert and oriented.     Lab Results:  Lab Results  Component Value Date   WBC 4.9 12/19/2014   HGB 12.2* 12/19/2014   HCT 37.2* 12/19/2014   MCV 101.3* 12/19/2014   PLT 162 12/19/2014   NEUTROABS 2.9 12/19/2014    Imaging:  No results found.  Medications: I have reviewed the patient's current medications.  Assessment/Plan: 1. 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 2. Elevated blood pressure. We recommended he establish with a primary care provider.   Disposition: Travis Mcdaniel appears stable. He has completed Soliris weekly 4. He will begin the every 2 week dosing schedule today. We will see him in follow-up with the next infusion in 2 weeks. He will contact the office in the interim with  any problems.  Plan reviewed with Dr. Julien Nordmann.    Ned Card ANP/GNP-BC   12/19/2014  1:59 PM

## 2014-12-19 NOTE — Patient Instructions (Signed)
[facility] Discharge Instructions for Patients Receiving Chemotherapy  Today you received the following chemotherapy agents :  Soliris.  To help prevent nausea and vomiting after your treatment, we encourage you to take your nausea medication as prescribed.   If you develop nausea and vomiting that is not controlled by your nausea medication, call the clinic.   BELOW ARE SYMPTOMS THAT SHOULD BE REPORTED IMMEDIATELY:  *FEVER GREATER THAN 100.5 F  *CHILLS WITH OR WITHOUT FEVER  NAUSEA AND VOMITING THAT IS NOT CONTROLLED WITH YOUR NAUSEA MEDICATION  *UNUSUAL SHORTNESS OF BREATH  *UNUSUAL BRUISING OR BLEEDING  TENDERNESS IN MOUTH AND THROAT WITH OR WITHOUT PRESENCE OF ULCERS  *URINARY PROBLEMS  *BOWEL PROBLEMS  UNUSUAL RASH Items with * indicate a potential emergency and should be followed up as soon as possible.  Feel free to call the clinic you have any questions or concerns. The clinic phone number is [phone].  Please show the CHEMO ALERT CARD at check-in to the Emergency Department and triage nurse.   

## 2014-12-19 NOTE — Telephone Encounter (Signed)
per pof to sch pt appt-reply from Dr Julien Nordmann ok to move appt to 7/7-sent MW email to sch trmt 7/7 to coordinate w/MD

## 2014-12-19 NOTE — Progress Notes (Signed)
1705: Pt tolerated soliris with no complications or complaints.  Observed for 1 hour post infusion and discharged with no further questions or concerns.

## 2014-12-20 ENCOUNTER — Telehealth: Payer: Self-pay | Admitting: *Deleted

## 2014-12-20 ENCOUNTER — Telehealth: Payer: Self-pay | Admitting: Internal Medicine

## 2014-12-20 NOTE — Telephone Encounter (Signed)
per pof to sch pt appt-cld & left pt a message of time & date of appt °

## 2014-12-20 NOTE — Telephone Encounter (Signed)
Per staff message and POF I have scheduled appts. Advised scheduler of appts. JMW  

## 2014-12-21 ENCOUNTER — Encounter (HOSPITAL_COMMUNITY): Payer: Self-pay

## 2015-01-03 ENCOUNTER — Ambulatory Visit (HOSPITAL_BASED_OUTPATIENT_CLINIC_OR_DEPARTMENT_OTHER): Payer: Commercial Managed Care - HMO

## 2015-01-03 ENCOUNTER — Encounter: Payer: Self-pay | Admitting: Internal Medicine

## 2015-01-03 ENCOUNTER — Ambulatory Visit (HOSPITAL_BASED_OUTPATIENT_CLINIC_OR_DEPARTMENT_OTHER): Payer: Commercial Managed Care - HMO | Admitting: Internal Medicine

## 2015-01-03 ENCOUNTER — Telehealth: Payer: Self-pay | Admitting: Internal Medicine

## 2015-01-03 ENCOUNTER — Other Ambulatory Visit (HOSPITAL_BASED_OUTPATIENT_CLINIC_OR_DEPARTMENT_OTHER): Payer: Commercial Managed Care - HMO

## 2015-01-03 VITALS — BP 163/73 | HR 88 | Temp 98.5°F | Resp 18 | Ht 66.0 in | Wt 160.5 lb

## 2015-01-03 DIAGNOSIS — D595 Paroxysmal nocturnal hemoglobinuria [Marchiafava-Micheli]: Secondary | ICD-10-CM | POA: Diagnosis not present

## 2015-01-03 DIAGNOSIS — Z5112 Encounter for antineoplastic immunotherapy: Secondary | ICD-10-CM

## 2015-01-03 LAB — COMPREHENSIVE METABOLIC PANEL (CC13)
ALT: 15 U/L (ref 0–55)
ANION GAP: 11 meq/L (ref 3–11)
AST: 22 U/L (ref 5–34)
Albumin: 4.1 g/dL (ref 3.5–5.0)
Alkaline Phosphatase: 67 U/L (ref 40–150)
BILIRUBIN TOTAL: 2.86 mg/dL — AB (ref 0.20–1.20)
BUN: 8.3 mg/dL (ref 7.0–26.0)
CO2: 24 mEq/L (ref 22–29)
CREATININE: 0.8 mg/dL (ref 0.7–1.3)
Calcium: 9.5 mg/dL (ref 8.4–10.4)
Chloride: 105 mEq/L (ref 98–109)
GLUCOSE: 98 mg/dL (ref 70–140)
Potassium: 4 mEq/L (ref 3.5–5.1)
Sodium: 140 mEq/L (ref 136–145)
Total Protein: 6.7 g/dL (ref 6.4–8.3)

## 2015-01-03 LAB — CBC WITH DIFFERENTIAL/PLATELET
BASO%: 1 % (ref 0.0–2.0)
Basophils Absolute: 0 10*3/uL (ref 0.0–0.1)
EOS%: 6.2 % (ref 0.0–7.0)
Eosinophils Absolute: 0.3 10*3/uL (ref 0.0–0.5)
HCT: 38.7 % (ref 38.4–49.9)
HGB: 12.8 g/dL — ABNORMAL LOW (ref 13.0–17.1)
LYMPH#: 1.2 10*3/uL (ref 0.9–3.3)
LYMPH%: 25.8 % (ref 14.0–49.0)
MCH: 35.4 pg — ABNORMAL HIGH (ref 27.2–33.4)
MCHC: 33.1 g/dL (ref 32.0–36.0)
MCV: 107 fL — ABNORMAL HIGH (ref 79.3–98.0)
MONO#: 0.5 10*3/uL (ref 0.1–0.9)
MONO%: 9.7 % (ref 0.0–14.0)
NEUT#: 2.8 10*3/uL (ref 1.5–6.5)
NEUT%: 57.3 % (ref 39.0–75.0)
Platelets: 142 10*3/uL (ref 140–400)
RBC: 3.61 10*6/uL — ABNORMAL LOW (ref 4.20–5.82)
RDW: 17.5 % — ABNORMAL HIGH (ref 11.0–14.6)
WBC: 4.8 10*3/uL (ref 4.0–10.3)

## 2015-01-03 LAB — LACTATE DEHYDROGENASE (CC13): LDH: 250 U/L — ABNORMAL HIGH (ref 125–245)

## 2015-01-03 MED ORDER — SODIUM CHLORIDE 0.9 % IV SOLN
900.0000 mg | Freq: Once | INTRAVENOUS | Status: AC
  Administered 2015-01-03: 900 mg via INTRAVENOUS
  Filled 2015-01-03: qty 90

## 2015-01-03 MED ORDER — SODIUM CHLORIDE 0.9 % IV SOLN
Freq: Once | INTRAVENOUS | Status: AC
  Administered 2015-01-03: 09:00:00 via INTRAVENOUS

## 2015-01-03 NOTE — Telephone Encounter (Signed)
Added appt per pof..pt will get sched in chemo

## 2015-01-03 NOTE — Patient Instructions (Signed)
[facility] Discharge Instructions for Patients Receiving Chemotherapy  Today you received the following chemotherapy agents: Soliris.  To help prevent nausea and vomiting after your treatment, we encourage you to take your nausea medication: Lorazepam as directed.   If you develop nausea and vomiting that is not controlled by your nausea medication, call the clinic.   BELOW ARE SYMPTOMS THAT SHOULD BE REPORTED IMMEDIATELY:  *FEVER GREATER THAN 100.5 F  *CHILLS WITH OR WITHOUT FEVER  NAUSEA AND VOMITING THAT IS NOT CONTROLLED WITH YOUR NAUSEA MEDICATION  *UNUSUAL SHORTNESS OF BREATH  *UNUSUAL BRUISING OR BLEEDING  TENDERNESS IN MOUTH AND THROAT WITH OR WITHOUT PRESENCE OF ULCERS  *URINARY PROBLEMS  *BOWEL PROBLEMS  UNUSUAL RASH Items with * indicate a potential emergency and should be followed up as soon as possible.  Feel free to call the clinic you have any questions or concerns. The clinic phone number is [phone].  Please show the [facility] at check-in to the Emergency Department and triage nurse.

## 2015-01-03 NOTE — Progress Notes (Signed)
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   PRIOR THERAPY:  1) Tapered dose of prednisone. 2) High dose of IVIG   CURRENT THERAPY: Soliris (Eculizumab) induction with 600 mg IV weekly 4 weeks followed by maintenance treatment at a dose of 900 mg IV every 2 weeks. First dose of treatment is expected on 11/21/2014. He is here today for day 15 of cycle #2.  INTERVAL HISTORY: Travis Mcdaniel 44 y.o. male returns to the clinic today for follow-up visit. He is tolerating his treatment with Soliris fairly well with no significant adverse effects. He denied having any significant fever or chills. He has no nausea or vomiting. He denied having any significant chest pain, shortness of breath, cough or hemoptysis. He has no significant weight loss or night sweats.  MEDICAL HISTORY: Past Medical History  Diagnosis Date  . Diverticulosis     ALLERGIES:  has No Known Allergies.  MEDICATIONS:  Current Outpatient Prescriptions  Medication Sig Dispense Refill  . LORazepam (ATIVAN) 0.5 MG tablet Take 1 tablet (0.5 mg total) by mouth at bedtime as needed for anxiety. 30 tablet 0  . Multiple Vitamin (MULTIVITAMIN WITH MINERALS) TABS tablet Take 1 tablet by mouth daily.     No current facility-administered medications for this visit.    SURGICAL HISTORY: No past surgical history on file.  REVIEW OF SYSTEMS:  Constitutional: negative Eyes: negative Ears, nose, mouth, throat, and face: negative Respiratory: negative Cardiovascular: negative Gastrointestinal: negative Genitourinary:negative Integument/breast: negative Hematologic/lymphatic: negative Musculoskeletal:positive for back pain Neurological:  negative Behavioral/Psych: positive for anxiety Endocrine: negative Allergic/Immunologic: negative   PHYSICAL EXAMINATION: General appearance: alert, cooperative and no distress Head: Normocephalic, without obvious abnormality, atraumatic Neck: no adenopathy, no JVD, supple, symmetrical, trachea midline and thyroid not enlarged, symmetric, no tenderness/mass/nodules Lymph nodes: Cervical, supraclavicular, and axillary nodes normal. Resp: clear to auscultation bilaterally Back: symmetric, no curvature. ROM normal. No CVA tenderness. Cardio: regular rate and rhythm, S1, S2 normal, no murmur, click, rub or gallop GI: soft, non-tender; bowel sounds normal; no masses,  no organomegaly Extremities: extremities normal, atraumatic, no cyanosis or edema Neurologic: Alert and oriented X 3, normal strength and tone. Normal symmetric reflexes. Normal coordination and gait  ECOG PERFORMANCE STATUS: 1 - Symptomatic but completely ambulatory  Blood pressure 163/73, pulse 88, temperature 98.5 F (36.9 C), temperature source Oral, resp. rate 18, height 5\' 6"  (1.676 m), weight 160 lb 8 oz (72.802 kg), SpO2 100 %.  LABORATORY DATA: Lab Results  Component Value Date   WBC 4.8 01/03/2015   HGB 12.8* 01/03/2015   HCT 38.7 01/03/2015   MCV 107.0* 01/03/2015   PLT 142 01/03/2015      Chemistry      Component Value Date/Time   NA 143 12/19/2014 1252   NA 140 09/07/2014 0407   K 3.6 12/19/2014 1252   K 3.9 09/07/2014 0407   CL 110 09/07/2014 0407   CO2 28 12/19/2014 1252   CO2 22 09/07/2014 0407   BUN 9.6 12/19/2014 1252   BUN 12 09/07/2014 0407   CREATININE 0.8 12/19/2014 1252   CREATININE 0.99 09/07/2014 0407      Component Value Date/Time   CALCIUM 9.2 12/19/2014 1252   CALCIUM 8.3* 09/07/2014 0407   ALKPHOS 68 12/19/2014  1252   ALKPHOS 30* 09/07/2014 0407   AST 20 12/19/2014 1252   AST 120* 09/07/2014 0407   ALT 12 12/19/2014 1252   ALT 27 09/07/2014 0407   BILITOT 2.59* 12/19/2014  1252   BILITOT 0.7 09/07/2014 0407       RADIOGRAPHIC STUDIES: No results found.  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 He is currently on treatment with Soliris. He completed the first cycle with weekly treatment and he is currently on day 15 of the second cycle. The patient history rating his treatment fairly well. I recommended for him to continue his treatment as a scheduled. He would come back for follow-up visit in 2 weeks for reevaluation. The patient was advised to call immediately if he has any concerning symptoms in the interval. The patient voices understanding of current disease status and treatment options and is in agreement with the current care plan.  All questions were answered. The patient knows to call the clinic with any problems, questions or concerns. We can certainly see the patient much sooner if necessary.  Disclaimer: This note was dictated with voice recognition software. Similar sounding words can inadvertently be transcribed and may not be corrected upon review.

## 2015-01-04 ENCOUNTER — Ambulatory Visit: Payer: 59

## 2015-01-04 ENCOUNTER — Other Ambulatory Visit: Payer: 59

## 2015-01-16 ENCOUNTER — Other Ambulatory Visit (HOSPITAL_BASED_OUTPATIENT_CLINIC_OR_DEPARTMENT_OTHER): Payer: Commercial Managed Care - HMO

## 2015-01-16 ENCOUNTER — Other Ambulatory Visit: Payer: Self-pay | Admitting: *Deleted

## 2015-01-16 ENCOUNTER — Other Ambulatory Visit: Payer: 59

## 2015-01-16 ENCOUNTER — Ambulatory Visit (HOSPITAL_BASED_OUTPATIENT_CLINIC_OR_DEPARTMENT_OTHER): Payer: Commercial Managed Care - HMO

## 2015-01-16 ENCOUNTER — Encounter: Payer: Self-pay | Admitting: Nurse Practitioner

## 2015-01-16 ENCOUNTER — Ambulatory Visit: Payer: 59

## 2015-01-16 ENCOUNTER — Ambulatory Visit (HOSPITAL_BASED_OUTPATIENT_CLINIC_OR_DEPARTMENT_OTHER): Payer: Commercial Managed Care - HMO | Admitting: Nurse Practitioner

## 2015-01-16 ENCOUNTER — Other Ambulatory Visit: Payer: Self-pay | Admitting: Internal Medicine

## 2015-01-16 DIAGNOSIS — D589 Hereditary hemolytic anemia, unspecified: Secondary | ICD-10-CM

## 2015-01-16 DIAGNOSIS — Z5112 Encounter for antineoplastic immunotherapy: Secondary | ICD-10-CM | POA: Diagnosis not present

## 2015-01-16 DIAGNOSIS — D595 Paroxysmal nocturnal hemoglobinuria [Marchiafava-Micheli]: Secondary | ICD-10-CM

## 2015-01-16 LAB — COMPREHENSIVE METABOLIC PANEL (CC13)
ALK PHOS: 70 U/L (ref 40–150)
ALT: 16 U/L (ref 0–55)
AST: 24 U/L (ref 5–34)
Albumin: 4.3 g/dL (ref 3.5–5.0)
Anion Gap: 9 mEq/L (ref 3–11)
BUN: 11.9 mg/dL (ref 7.0–26.0)
CALCIUM: 9.6 mg/dL (ref 8.4–10.4)
CO2: 25 mEq/L (ref 22–29)
CREATININE: 0.8 mg/dL (ref 0.7–1.3)
Chloride: 107 mEq/L (ref 98–109)
EGFR: >90 mL/min/{1.73_m2} (ref >90)
GLUCOSE: 92 mg/dL (ref 70–140)
POTASSIUM: 4 meq/L (ref 3.5–5.1)
Sodium: 141 mEq/L (ref 136–145)
TOTAL PROTEIN: 6.8 g/dL (ref 6.4–8.3)

## 2015-01-16 LAB — CBC WITH DIFFERENTIAL/PLATELET
BASO%: 0.8 % (ref 0.0–2.0)
Basophils Absolute: 0 10*3/uL (ref 0.0–0.1)
EOS ABS: 0.5 10*3/uL (ref 0.0–0.5)
EOS%: 8.9 % — ABNORMAL HIGH (ref 0.0–7.0)
HCT: 40.1 % (ref 38.4–49.9)
HGB: 13.4 g/dL (ref 13.0–17.1)
LYMPH%: 29.5 % (ref 14.0–49.0)
MCH: 36.4 pg — ABNORMAL HIGH (ref 27.2–33.4)
MCHC: 33.4 g/dL (ref 32.0–36.0)
MCV: 109 fL — ABNORMAL HIGH (ref 79.3–98.0)
MONO#: 0.5 10*3/uL (ref 0.1–0.9)
MONO%: 9.9 % (ref 0.0–14.0)
NEUT%: 50.9 % (ref 39.0–75.0)
NEUTROS ABS: 2.6 10*3/uL (ref 1.5–6.5)
Platelets: 117 10*3/uL — ABNORMAL LOW (ref 140–400)
RBC: 3.68 10*6/uL — AB (ref 4.20–5.82)
RDW: 15.6 % — ABNORMAL HIGH (ref 11.0–14.6)
WBC: 5.2 10*3/uL (ref 4.0–10.3)
lymph#: 1.5 10*3/uL (ref 0.9–3.3)

## 2015-01-16 LAB — LACTATE DEHYDROGENASE (CC13): LDH: 244 U/L (ref 125–245)

## 2015-01-16 MED ORDER — SODIUM CHLORIDE 0.9 % IV SOLN
Freq: Once | INTRAVENOUS | Status: AC
  Administered 2015-01-16: 11:00:00 via INTRAVENOUS

## 2015-01-16 MED ORDER — LORAZEPAM 0.5 MG PO TABS: 0.5000 mg | ORAL_TABLET | Freq: Every evening | ORAL | Status: DC | PRN

## 2015-01-16 MED ORDER — SODIUM CHLORIDE 0.9 % IV SOLN
900.0000 mg | Freq: Once | INTRAVENOUS | Status: AC
  Administered 2015-01-16: 900 mg via INTRAVENOUS
  Filled 2015-01-16: qty 90

## 2015-01-16 NOTE — Progress Notes (Signed)
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   PRIOR THERAPY:  1) Tapered dose of prednisone. 2) High dose of IVIG   CURRENT THERAPY: Soliris (Eculizumab) induction with 600 mg IV weekly 4 weeks followed by maintenance treatment at a dose of 900 mg IV every 2 weeks. First dose of treatment was on 11/21/2014. He is here today for day 1 of cycle #3.  INTERVAL HISTORY: Travis Mcdaniel 44 y.o. male returns to the clinic today for follow-up visit. He continues to tolerate treatment well with no complaints or side effects that he is aware of. He denies fevers, chills, nausea, or vomiting. There are no changes to his bowel or bladder habits.  He denies bruising, bleeding, or hematuria. He has no night sweats or weight loss. He denies shortness of breath, chest pain, cough, or palpitations. He uses lorazepam QHS to help "shut his mind off" from work stress and for sleep. He needs a refill today.  MEDICAL HISTORY: Past Medical History  Diagnosis Date  . Diverticulosis     ALLERGIES:  has No Known Allergies.  MEDICATIONS:  Current Outpatient Prescriptions  Medication Sig Dispense Refill  . Multiple Vitamin (MULTIVITAMIN WITH MINERALS) TABS tablet Take 1 tablet by mouth daily.    . Psyllium (METAMUCIL PO) Take 1 capsule by mouth daily.    [name] Kitchen LORazepam (ATIVAN) 0.5 MG tablet Take 1 tablet (0.5 mg total) by mouth at bedtime as needed for anxiety. 30 tablet 0   No current facility-administered medications for this visit.    SURGICAL HISTORY: No past surgical history on file.  REVIEW OF SYSTEMS:  Constitutional: negative Eyes: negative Ears, nose, mouth, throat, and face: negative Respiratory: negative Cardiovascular:  negative Gastrointestinal: negative Genitourinary:negative Integument/breast: negative Hematologic/lymphatic: negative Musculoskeletal:positive for back pain Neurological: negative Behavioral/Psych: positive for anxiety Endocrine: negative Allergic/Immunologic: negative   PHYSICAL EXAMINATION: General appearance: alert, cooperative and no distress Head: Normocephalic, without obvious abnormality, atraumatic Neck: no adenopathy, no JVD, supple, symmetrical, trachea midline and thyroid not enlarged, symmetric, no tenderness/mass/nodules Lymph nodes: Cervical, supraclavicular, and axillary nodes normal. Resp: clear to auscultation bilaterally Back: symmetric, no curvature. ROM normal. No CVA tenderness. Cardio: regular rate and rhythm, S1, S2 normal, no murmur, click, rub or gallop GI: soft, non-tender; bowel sounds normal; no masses,  no organomegaly Extremities: extremities normal, atraumatic, no cyanosis or edema Neurologic: Alert and oriented X 3, normal strength and tone. Normal symmetric reflexes. Normal coordination and gait  ECOG PERFORMANCE STATUS: 1 - Symptomatic but completely ambulatory  There were no vitals taken for this visit.  LABORATORY DATA: Lab Results  Component Value Date   WBC 5.2 01/16/2015   HGB 13.4 01/16/2015   HCT 40.1 01/16/2015   MCV 109.0* 01/16/2015   PLT 117* 01/16/2015      Chemistry      Component Value Date/Time   NA 140 01/03/2015 0758   NA 140 09/07/2014 0407   K 4.0 01/03/2015 0758   K 3.9 09/07/2014 0407   CL 110 09/07/2014 0407   CO2 24 01/03/2015 0758   CO2 22 09/07/2014 0407   BUN 8.3 01/03/2015 0758   BUN 12 09/07/2014 0407   CREATININE 0.8 01/03/2015 0758   CREATININE 0.99 09/07/2014 0407      Component  Value Date/Time   CALCIUM 9.5 01/03/2015 0758   CALCIUM 8.3* 09/07/2014 0407   ALKPHOS 67 01/03/2015 0758   ALKPHOS 30* 09/07/2014 0407   AST 22 01/03/2015 0758   AST 120* 09/07/2014 0407   ALT 15 01/03/2015 0758   ALT  27 09/07/2014 0407   BILITOT 2.86* 01/03/2015 0758   BILITOT 0.7 09/07/2014 0407       RADIOGRAPHIC STUDIES: No results found.  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 He is currently on treatment with Soliris. He is status post 2 cycles, with the first consisting of weekly treatment.  He will proceed with day 1 of cycle #3. The labs are stable and he is tolerating treatment well.  He has no complaints to offer today.  I refilled his lorazepam for 30 days.  He will return in 2 weeks for day 15 of treatment.   The patient was advised to call immediately if he has any concerning symptoms in the interval. The patient voices understanding of current disease status and treatment options and is in agreement with the current care plan.  All questions were answered. The patient knows to call the clinic with any problems, questions or concerns. We can certainly see the patient much sooner if necessary.   Laurie Panda, NP 01/16/2015

## 2015-01-16 NOTE — Patient Instructions (Signed)
[facility] Discharge Instructions for Patients Receiving Chemotherapy  Today you received the following chemotherapy agents:  Soliris  To help prevent nausea and vomiting after your treatment, we encourage you to take your nausea medication as ordered per MD.    If you develop nausea and vomiting that is not controlled by your nausea medication, call the clinic.   BELOW ARE SYMPTOMS THAT SHOULD BE REPORTED IMMEDIATELY:  *FEVER GREATER THAN 100.5 F  *CHILLS WITH OR WITHOUT FEVER  NAUSEA AND VOMITING THAT IS NOT CONTROLLED WITH YOUR NAUSEA MEDICATION  *UNUSUAL SHORTNESS OF BREATH  *UNUSUAL BRUISING OR BLEEDING  TENDERNESS IN MOUTH AND THROAT WITH OR WITHOUT PRESENCE OF ULCERS  *URINARY PROBLEMS  *BOWEL PROBLEMS  UNUSUAL RASH Items with * indicate a potential emergency and should be followed up as soon as possible.  Feel free to call the clinic you have any questions or concerns. The clinic phone number is [phone].  Please show the CHEMO ALERT CARD at check-in to the Emergency Department and triage nurse.   

## 2015-01-16 NOTE — Progress Notes (Signed)
Ok to treat today with total bili 3.66 per Dr. Julien Nordmann.

## 2015-01-18 ENCOUNTER — Other Ambulatory Visit: Payer: 59

## 2015-01-30 ENCOUNTER — Telehealth: Payer: Self-pay | Admitting: *Deleted

## 2015-01-30 ENCOUNTER — Telehealth: Payer: Self-pay | Admitting: Physician Assistant

## 2015-01-30 ENCOUNTER — Ambulatory Visit (HOSPITAL_BASED_OUTPATIENT_CLINIC_OR_DEPARTMENT_OTHER): Payer: Commercial Managed Care - HMO

## 2015-01-30 ENCOUNTER — Other Ambulatory Visit (HOSPITAL_BASED_OUTPATIENT_CLINIC_OR_DEPARTMENT_OTHER): Payer: Commercial Managed Care - HMO

## 2015-01-30 ENCOUNTER — Encounter: Payer: Self-pay | Admitting: Physician Assistant

## 2015-01-30 ENCOUNTER — Ambulatory Visit (HOSPITAL_BASED_OUTPATIENT_CLINIC_OR_DEPARTMENT_OTHER): Payer: Commercial Managed Care - HMO | Admitting: Physician Assistant

## 2015-01-30 VITALS — BP 154/78 | HR 84 | Temp 98.9°F | Resp 17 | Ht 66.0 in | Wt 161.9 lb

## 2015-01-30 DIAGNOSIS — R7401 Elevation of levels of liver transaminase levels: Secondary | ICD-10-CM

## 2015-01-30 DIAGNOSIS — D595 Paroxysmal nocturnal hemoglobinuria [Marchiafava-Micheli]: Secondary | ICD-10-CM

## 2015-01-30 DIAGNOSIS — Z5112 Encounter for antineoplastic immunotherapy: Secondary | ICD-10-CM | POA: Diagnosis not present

## 2015-01-30 DIAGNOSIS — R740 Nonspecific elevation of levels of transaminase and lactic acid dehydrogenase [LDH]: Secondary | ICD-10-CM

## 2015-01-30 LAB — COMPREHENSIVE METABOLIC PANEL (CC13)
ALT: 17 U/L (ref 0–55)
AST: 25 U/L (ref 5–34)
Albumin: 4.2 g/dL (ref 3.5–5.0)
Alkaline Phosphatase: 67 U/L (ref 40–150)
Anion Gap: 8 mEq/L (ref 3–11)
BILIRUBIN TOTAL: 3.24 mg/dL — AB (ref 0.20–1.20)
BUN: 12.5 mg/dL (ref 7.0–26.0)
CALCIUM: 9.3 mg/dL (ref 8.4–10.4)
CO2: 26 mEq/L (ref 22–29)
Chloride: 106 mEq/L (ref 98–109)
Creatinine: 0.8 mg/dL (ref 0.7–1.3)
EGFR: >90 mL/min/{1.73_m2} (ref >90)
Glucose: 99 mg/dl (ref 70–140)
Potassium: 3.9 mEq/L (ref 3.5–5.1)
Sodium: 141 mEq/L (ref 136–145)
TOTAL PROTEIN: 6.6 g/dL (ref 6.4–8.3)

## 2015-01-30 LAB — CBC WITH DIFFERENTIAL/PLATELET
BASO%: 1.1 % (ref 0.0–2.0)
Basophils Absolute: 0.1 10*3/uL (ref 0.0–0.1)
EOS%: 9 % — ABNORMAL HIGH (ref 0.0–7.0)
Eosinophils Absolute: 0.5 10*3/uL (ref 0.0–0.5)
HEMATOCRIT: 38.5 % (ref 38.4–49.9)
HGB: 12.9 g/dL — ABNORMAL LOW (ref 13.0–17.1)
LYMPH%: 20.7 % (ref 14.0–49.0)
MCH: 37 pg — AB (ref 27.2–33.4)
MCHC: 33.6 g/dL (ref 32.0–36.0)
MCV: 110 fL — ABNORMAL HIGH (ref 79.3–98.0)
MONO#: 0.5 10*3/uL (ref 0.1–0.9)
MONO%: 8.5 % (ref 0.0–14.0)
NEUT#: 3.2 10*3/uL (ref 1.5–6.5)
NEUT%: 60.7 % (ref 39.0–75.0)
PLATELETS: 126 10*3/uL — AB (ref 140–400)
RBC: 3.5 10*6/uL — ABNORMAL LOW (ref 4.20–5.82)
RDW: 15.5 % — AB (ref 11.0–14.6)
WBC: 5.3 10*3/uL (ref 4.0–10.3)
lymph#: 1.1 10*3/uL (ref 0.9–3.3)

## 2015-01-30 LAB — TECHNOLOGIST REVIEW: Technologist Review: 1

## 2015-01-30 LAB — LACTATE DEHYDROGENASE (CC13): LDH: 274 U/L — ABNORMAL HIGH (ref 125–245)

## 2015-01-30 MED ORDER — SODIUM CHLORIDE 0.9 % IV SOLN
Freq: Once | INTRAVENOUS | Status: AC
  Administered 2015-01-30: 11:00:00 via INTRAVENOUS

## 2015-01-30 MED ORDER — SODIUM CHLORIDE 0.9 % IV SOLN
900.0000 mg | Freq: Once | INTRAVENOUS | Status: AC
  Administered 2015-01-30: 900 mg via INTRAVENOUS
  Filled 2015-01-30: qty 90

## 2015-01-30 NOTE — Telephone Encounter (Signed)
Pt confirmed labs/ov per 08/03 POF, gave pt avs and calendar... KJ, sent msg to add chemo °

## 2015-01-30 NOTE — Telephone Encounter (Signed)
Per policy patient has one treatment past next office visit already scheduled

## 2015-01-30 NOTE — Progress Notes (Addendum)
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   PRIOR THERAPY:  1) Tapered dose of prednisone. 2) High dose of IVIG   CURRENT THERAPY: Soliris (Eculizumab) induction with 600 mg IV weekly 4 weeks followed by maintenance treatment at a dose of 900 mg IV every 2 weeks. First dose of treatment was on 11/21/2014. He is here today for day 15 of cycle #3.  INTERVAL HISTORY: Travis Mcdaniel 44 y.o. male returns to the clinic today for follow-up visit. He continues to tolerate treatment well with no complaints or side effects that he is aware of. He is feeling fine. His appetite has improved and he has gained a few more pounds. He denies fevers, chills, nausea, or vomiting. There are no changes to his bowel or bladder habits.  He denies bruising, bleeding, or hematuria. He has no night sweats or weight loss. He denies shortness of breath, chest pain, cough, or palpitations. He continues to use lorazepam QHS to help "shut his mind off" from work stress and for sleep.  MEDICAL HISTORY: Past Medical History  Diagnosis Date  . Diverticulosis     ALLERGIES:  has No Known Allergies.  MEDICATIONS:  Current Outpatient Prescriptions  Medication Sig Dispense Refill  . LORazepam (ATIVAN) 0.5 MG tablet Take 1 tablet (0.5 mg total) by mouth at bedtime as needed for anxiety. 30 tablet 0  . Multiple Vitamin (MULTIVITAMIN WITH MINERALS) TABS tablet Take 1 tablet by mouth daily.    . Psyllium (METAMUCIL PO) Take 1 capsule by mouth daily.     No current facility-administered medications for this visit.   Facility-Administered Medications Ordered in Other Visits  Medication Dose Route Frequency Provider Last Rate Last Dose  . 0.9 %  sodium  chloride infusion   Intravenous Once Curt Bears, MD      . eculizumab (SOLIRIS) 900 mg in sodium chloride 0.9 % 90 mL infusion  900 mg Intravenous Once Curt Bears, MD        SURGICAL HISTORY: History reviewed. No pertinent past surgical history.  REVIEW OF SYSTEMS:  Constitutional: negative Eyes: negative Ears, nose, mouth, throat, and face: negative Respiratory: negative Cardiovascular: negative Gastrointestinal: negative Genitourinary:negative Integument/breast: negative Hematologic/lymphatic: negative Musculoskeletal:positive for back pain Neurological: negative Behavioral/Psych: positive for anxiety Endocrine: negative Allergic/Immunologic: negative   PHYSICAL EXAMINATION: General appearance: alert, cooperative and no distress Head: Normocephalic, without obvious abnormality, atraumatic Neck: no adenopathy, no JVD, supple, symmetrical, trachea midline and thyroid not enlarged, symmetric, no tenderness/mass/nodules Lymph nodes: Cervical, supraclavicular, and axillary nodes normal. Resp: clear to auscultation bilaterally Back: symmetric, no curvature. ROM normal. No CVA tenderness. Cardio: regular rate and rhythm, S1, S2 normal, no murmur, click, rub or gallop GI: soft, non-tender; bowel sounds normal; no masses,  no organomegaly Extremities: extremities normal, atraumatic, no cyanosis or edema Neurologic: Alert and oriented X 3, normal strength and tone. Normal symmetric reflexes. Normal coordination and gait  ECOG PERFORMANCE STATUS: 1 - Symptomatic but completely ambulatory  Blood pressure 154/78, pulse 84, temperature 98.9 F (37.2 C), temperature source Oral, resp. rate 17, height 5\' 6"  (1.676 m), weight 161 lb 14.4 oz (73.437 kg), SpO2 100 %.  LABORATORY DATA: Lab Results  Component Value Date   WBC 5.3 01/30/2015   HGB 12.9* 01/30/2015  HCT 38.5 01/30/2015   MCV 110.0* 01/30/2015   PLT 126* 01/30/2015      Chemistry      Component Value Date/Time     NA 141 01/30/2015 0821   NA 140 09/07/2014 0407   K 3.9 01/30/2015 0821   K 3.9 09/07/2014 0407   CL 110 09/07/2014 0407   CO2 26 01/30/2015 0821   CO2 22 09/07/2014 0407   BUN 12.5 01/30/2015 0821   BUN 12 09/07/2014 0407   CREATININE 0.8 01/30/2015 0821   CREATININE 0.99 09/07/2014 0407      Component Value Date/Time   CALCIUM 9.3 01/30/2015 0821   CALCIUM 8.3* 09/07/2014 0407   ALKPHOS 67 01/30/2015 0821   ALKPHOS 30* 09/07/2014 0407   AST 25 01/30/2015 0821   AST 120* 09/07/2014 0407   ALT 17 01/30/2015 0821   ALT 27 09/07/2014 0407   BILITOT 3.24* 01/30/2015 0821   BILITOT 0.7 09/07/2014 0407       RADIOGRAPHIC STUDIES: No results found.  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 He is currently on treatment with Soliris. He is status post 2 cycles, with the first consisting of weekly treatment.  He will proceed with day 15 of cycle #3. The labs are stable and he is tolerating treatment well.  He has no complaints to offer today.  Patient was discussed with and also seen by Dr. Julien Nordmann.  He will return in 2 weeks for day 1 of cycle #4   The patient was advised to call immediately if he has any concerning symptoms in the interval. The patient voices understanding of current disease status and treatment options and is in agreement with the current care plan.  All questions were answered. The patient knows to call the clinic with any problems, questions or concerns. We can certainly see the patient much sooner if necessary.   Carlton Adam, [name] 01/30/2015   ADDENDUM: Hematology/Oncology Attending: I had a face to face encounter with the patient. I recommended his care plan. This is a very pleasant 44 years old white male recently diagnosed with paroxysmal nocturnal hemoglobinuria and currently  undergoing treatment with Soliris status post 3 cycles. The patient is tolerating his treatment well with no significant adverse effects. He continues to have elevated serum bilirubin but the patient has been drinking a lot over the weekend on the beach. We will continue to monitor his chemistry as well as CBC closely. We will proceed with day 15 of cycle #3 today as a scheduled. The patient would come back for follow-up visit in 2 weeks for reevaluation before starting cycle #4. He was advised to call immediately if he has any concerning symptoms in the interval.  Disclaimer: This note was dictated with voice recognition software. Similar sounding words can inadvertently be transcribed and may be missed upon review.

## 2015-01-30 NOTE — Patient Instructions (Signed)
Continue labs and chemotherapy as scheduled  Follow up in 2 weeks 

## 2015-01-30 NOTE — Patient Instructions (Signed)
[facility] Discharge Instructions for Patients Receiving Chemotherapy  Today you received the following chemotherapy agents Soliris  To help prevent nausea and vomiting after your treatment, we encourage you to take your nausea medication.   If you develop nausea and vomiting that is not controlled by your nausea medication, call the clinic.   BELOW ARE SYMPTOMS THAT SHOULD BE REPORTED IMMEDIATELY:  *FEVER GREATER THAN 100.5 F  *CHILLS WITH OR WITHOUT FEVER  NAUSEA AND VOMITING THAT IS NOT CONTROLLED WITH YOUR NAUSEA MEDICATION  *UNUSUAL SHORTNESS OF BREATH  *UNUSUAL BRUISING OR BLEEDING  TENDERNESS IN MOUTH AND THROAT WITH OR WITHOUT PRESENCE OF ULCERS  *URINARY PROBLEMS  *BOWEL PROBLEMS  UNUSUAL RASH Items with * indicate a potential emergency and should be followed up as soon as possible.  Feel free to call the clinic you have any questions or concerns. The clinic phone number is [phone].  Please show the CHEMO ALERT CARD at check-in to the Emergency Department and triage nurse.   

## 2015-02-13 ENCOUNTER — Other Ambulatory Visit (HOSPITAL_BASED_OUTPATIENT_CLINIC_OR_DEPARTMENT_OTHER): Payer: Commercial Managed Care - HMO

## 2015-02-13 ENCOUNTER — Encounter: Payer: Self-pay | Admitting: Internal Medicine

## 2015-02-13 ENCOUNTER — Ambulatory Visit (HOSPITAL_BASED_OUTPATIENT_CLINIC_OR_DEPARTMENT_OTHER): Payer: Commercial Managed Care - HMO

## 2015-02-13 ENCOUNTER — Ambulatory Visit (HOSPITAL_BASED_OUTPATIENT_CLINIC_OR_DEPARTMENT_OTHER): Payer: Commercial Managed Care - HMO | Admitting: Internal Medicine

## 2015-02-13 ENCOUNTER — Telehealth: Payer: Self-pay | Admitting: Internal Medicine

## 2015-02-13 ENCOUNTER — Telehealth: Payer: Self-pay

## 2015-02-13 VITALS — BP 162/77 | HR 64 | Temp 98.4°F | Resp 17 | Ht 66.0 in | Wt 160.6 lb

## 2015-02-13 DIAGNOSIS — F419 Anxiety disorder, unspecified: Secondary | ICD-10-CM | POA: Diagnosis not present

## 2015-02-13 DIAGNOSIS — I10 Essential (primary) hypertension: Secondary | ICD-10-CM

## 2015-02-13 DIAGNOSIS — Z5112 Encounter for antineoplastic immunotherapy: Secondary | ICD-10-CM | POA: Diagnosis not present

## 2015-02-13 DIAGNOSIS — D595 Paroxysmal nocturnal hemoglobinuria [Marchiafava-Micheli]: Secondary | ICD-10-CM | POA: Diagnosis not present

## 2015-02-13 DIAGNOSIS — D589 Hereditary hemolytic anemia, unspecified: Secondary | ICD-10-CM

## 2015-02-13 LAB — COMPREHENSIVE METABOLIC PANEL (CC13)
ALBUMIN: 4.3 g/dL (ref 3.5–5.0)
ALT: 16 U/L (ref 0–55)
ANION GAP: 10 meq/L (ref 3–11)
AST: 19 U/L (ref 5–34)
Alkaline Phosphatase: 70 U/L (ref 40–150)
BUN: 11.4 mg/dL (ref 7.0–26.0)
CALCIUM: 9.4 mg/dL (ref 8.4–10.4)
CHLORIDE: 108 meq/L (ref 98–109)
CO2: 23 mEq/L (ref 22–29)
Creatinine: 0.8 mg/dL (ref 0.7–1.3)
Glucose: 95 mg/dl (ref 70–140)
POTASSIUM: 4 meq/L (ref 3.5–5.1)
Sodium: 142 mEq/L (ref 136–145)
Total Bilirubin: 2.35 mg/dL — ABNORMAL HIGH (ref 0.20–1.20)
Total Protein: 6.9 g/dL (ref 6.4–8.3)

## 2015-02-13 LAB — CBC WITH DIFFERENTIAL/PLATELET
BASO%: 1 % (ref 0.0–2.0)
BASOS ABS: 0.1 10*3/uL (ref 0.0–0.1)
EOS ABS: 0.5 10*3/uL (ref 0.0–0.5)
EOS%: 9.4 % — ABNORMAL HIGH (ref 0.0–7.0)
HCT: 40.9 % (ref 38.4–49.9)
HGB: 13.8 g/dL (ref 13.0–17.1)
LYMPH%: 26.3 % (ref 14.0–49.0)
MCH: 36.7 pg — ABNORMAL HIGH (ref 27.2–33.4)
MCHC: 33.6 g/dL (ref 32.0–36.0)
MCV: 109 fL — AB (ref 79.3–98.0)
MONO#: 0.5 10*3/uL (ref 0.1–0.9)
MONO%: 9.9 % (ref 0.0–14.0)
NEUT#: 2.8 10*3/uL (ref 1.5–6.5)
NEUT%: 53.4 % (ref 39.0–75.0)
Platelets: 133 10*3/uL — ABNORMAL LOW (ref 140–400)
RBC: 3.76 10*6/uL — AB (ref 4.20–5.82)
RDW: 14.3 % (ref 11.0–14.6)
WBC: 5.2 10*3/uL (ref 4.0–10.3)
lymph#: 1.4 10*3/uL (ref 0.9–3.3)

## 2015-02-13 LAB — TECHNOLOGIST REVIEW

## 2015-02-13 LAB — LACTATE DEHYDROGENASE (CC13): LDH: 222 U/L (ref 125–245)

## 2015-02-13 MED ORDER — SODIUM CHLORIDE 0.9 % IV SOLN
Freq: Once | INTRAVENOUS | Status: AC
  Administered 2015-02-13: 10:00:00 via INTRAVENOUS

## 2015-02-13 MED ORDER — SODIUM CHLORIDE 0.9 % IV SOLN
900.0000 mg | Freq: Once | INTRAVENOUS | Status: AC
  Administered 2015-02-13: 900 mg via INTRAVENOUS
  Filled 2015-02-13: qty 90

## 2015-02-13 MED ORDER — LORAZEPAM 0.5 MG PO TABS: 0.5000 mg | ORAL_TABLET | Freq: Every evening | ORAL | Status: DC | PRN

## 2015-02-13 NOTE — Progress Notes (Signed)
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   PRIOR THERAPY:  1) Tapered dose of prednisone. 2) High dose of IVIG   CURRENT THERAPY: Soliris (Eculizumab) induction with 600 mg IV weekly 4 weeks followed by maintenance treatment at a dose of 900 mg IV every 2 weeks. First dose of treatment is expected on 11/21/2014. Status post 3 cycles. He is here today to start day 1 of cycle #4.  INTERVAL HISTORY: Travis Mcdaniel 44 y.o. male returns to the clinic today for follow-up visit. He is tolerating his treatment with Soliris fairly well with no significant adverse effects. He denied having any significant fever or chills. He has no nausea or vomiting. He denied having any significant chest pain, shortness of breath, cough or hemoptysis. He has no significant weight loss or night sweats. He continues to have anxiety and would like refill of Ativan. He has not established care with a primary care physician.  MEDICAL HISTORY: Past Medical History  Diagnosis Date  . Diverticulosis     ALLERGIES:  has No Known Allergies.  MEDICATIONS:  Current Outpatient Prescriptions  Medication Sig Dispense Refill  . LORazepam (ATIVAN) 0.5 MG tablet Take 1 tablet (0.5 mg total) by mouth at bedtime as needed for anxiety. 30 tablet 0  . Multiple Vitamin (MULTIVITAMIN WITH MINERALS) TABS tablet Take 1 tablet by mouth daily.    . Psyllium (METAMUCIL PO) Take 1 capsule by mouth daily.     No current facility-administered medications for this visit.    SURGICAL HISTORY: No past surgical history on file.  REVIEW OF SYSTEMS:  Constitutional: negative Eyes: negative Ears, nose, mouth, throat, and face: negative Respiratory:  negative Cardiovascular: negative Gastrointestinal: negative Genitourinary:negative Integument/breast: negative Hematologic/lymphatic: negative Musculoskeletal:positive for back pain Neurological: negative Behavioral/Psych: positive for anxiety Endocrine: negative Allergic/Immunologic: negative   PHYSICAL EXAMINATION: General appearance: alert, cooperative and no distress Head: Normocephalic, without obvious abnormality, atraumatic Neck: no adenopathy, no JVD, supple, symmetrical, trachea midline and thyroid not enlarged, symmetric, no tenderness/mass/nodules Lymph nodes: Cervical, supraclavicular, and axillary nodes normal. Resp: clear to auscultation bilaterally Back: symmetric, no curvature. ROM normal. No CVA tenderness. Cardio: regular rate and rhythm, S1, S2 normal, no murmur, click, rub or gallop GI: soft, non-tender; bowel sounds normal; no masses,  no organomegaly Extremities: extremities normal, atraumatic, no cyanosis or edema Neurologic: Alert and oriented X 3, normal strength and tone. Normal symmetric reflexes. Normal coordination and gait  ECOG PERFORMANCE STATUS: 1 - Symptomatic but completely ambulatory  Blood pressure 162/77, pulse 64, temperature 98.4 F (36.9 C), temperature source Oral, resp. rate 17, height 5\' 6"  (1.676 m), weight 160 lb 9.6 oz (72.848 kg), SpO2 100 %.  LABORATORY DATA: Lab Results  Component Value Date   WBC 5.2 02/13/2015   HGB 13.8 02/13/2015   HCT 40.9 02/13/2015   MCV 109.0* 02/13/2015   PLT 133* 02/13/2015      Chemistry      Component Value Date/Time   NA 142 02/13/2015 0908   NA 140 09/07/2014 0407   K 4.0 02/13/2015 0908   K 3.9 09/07/2014 0407   CL 110 09/07/2014 0407   CO2 23 02/13/2015 0908   CO2 22 09/07/2014 0407   BUN 11.4 02/13/2015 0908   BUN 12  09/07/2014 0407   CREATININE 0.8 02/13/2015 0908   CREATININE 0.99 09/07/2014 0407      Component Value Date/Time   CALCIUM 9.4 02/13/2015 0908   CALCIUM 8.3*  09/07/2014 0407   ALKPHOS 70 02/13/2015 0908   ALKPHOS 30* 09/07/2014 0407   AST 19 02/13/2015 0908   AST 120* 09/07/2014 0407   ALT 16 02/13/2015 0908   ALT 27 09/07/2014 0407   BILITOT 2.35* 02/13/2015 0908   BILITOT 0.7 09/07/2014 0407       RADIOGRAPHIC STUDIES: No results found.  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 He is currently on treatment with Soliris. He is status post 3 cycles of treatment.  The patient is tolerating his treatment fairly well. I recommended for him to continue his treatment as a scheduled. He would come back for follow-up visit in 2 weeks for reevaluation. For anxiety, the patient was given a refill of Ativan. For hypertension, I strongly advised the patient to establish care with a primary care physician for treatment of his condition. The patient was advised to call immediately if he has any concerning symptoms in the interval. The patient voices understanding of current disease status and treatment options and is in agreement with the current care plan.  All questions were answered. The patient knows to call the clinic with any problems, questions or concerns. We can certainly see the patient much sooner if necessary.  Disclaimer: This note was dictated with voice recognition software. Similar sounding words can inadvertently be transcribed and may not be corrected upon review.

## 2015-02-13 NOTE — Telephone Encounter (Signed)
Appointments made and avs will be printed in chemo  °

## 2015-02-13 NOTE — Telephone Encounter (Signed)
Vm from pt, forwarded to Dr. Ellan Lambert nurse

## 2015-02-13 NOTE — Patient Instructions (Signed)
[facility] Discharge Instructions for Patients Receiving Chemotherapy  Today you received the following chemotherapy agents :  Soliris.  To help prevent nausea and vomiting after your treatment, we encourage you to take your nausea medication as prescribed.   If you develop nausea and vomiting that is not controlled by your nausea medication, call the clinic.   BELOW ARE SYMPTOMS THAT SHOULD BE REPORTED IMMEDIATELY:  *FEVER GREATER THAN 100.5 F  *CHILLS WITH OR WITHOUT FEVER  NAUSEA AND VOMITING THAT IS NOT CONTROLLED WITH YOUR NAUSEA MEDICATION  *UNUSUAL SHORTNESS OF BREATH  *UNUSUAL BRUISING OR BLEEDING  TENDERNESS IN MOUTH AND THROAT WITH OR WITHOUT PRESENCE OF ULCERS  *URINARY PROBLEMS  *BOWEL PROBLEMS  UNUSUAL RASH Items with * indicate a potential emergency and should be followed up as soon as possible.  Feel free to call the clinic you have any questions or concerns. The clinic phone number is [phone].  Please show the CHEMO ALERT CARD at check-in to the Emergency Department and triage nurse.   

## 2015-02-14 ENCOUNTER — Telehealth: Payer: Self-pay | Admitting: Medical Oncology

## 2015-02-14 NOTE — Telephone Encounter (Signed)
Needs [place] to fax information to Dr [name].-Done

## 2015-02-27 ENCOUNTER — Encounter: Payer: Self-pay | Admitting: Physician Assistant

## 2015-02-27 ENCOUNTER — Ambulatory Visit (HOSPITAL_BASED_OUTPATIENT_CLINIC_OR_DEPARTMENT_OTHER): Payer: Commercial Managed Care - HMO | Admitting: Physician Assistant

## 2015-02-27 ENCOUNTER — Telehealth: Payer: Self-pay | Admitting: Internal Medicine

## 2015-02-27 ENCOUNTER — Other Ambulatory Visit (HOSPITAL_BASED_OUTPATIENT_CLINIC_OR_DEPARTMENT_OTHER): Payer: Commercial Managed Care - HMO

## 2015-02-27 ENCOUNTER — Ambulatory Visit (HOSPITAL_BASED_OUTPATIENT_CLINIC_OR_DEPARTMENT_OTHER): Payer: Commercial Managed Care - HMO

## 2015-02-27 VITALS — BP 137/72 | HR 76 | Temp 98.3°F | Resp 18 | Ht 66.0 in | Wt 158.4 lb

## 2015-02-27 DIAGNOSIS — D595 Paroxysmal nocturnal hemoglobinuria [Marchiafava-Micheli]: Secondary | ICD-10-CM | POA: Diagnosis not present

## 2015-02-27 DIAGNOSIS — D589 Hereditary hemolytic anemia, unspecified: Secondary | ICD-10-CM

## 2015-02-27 DIAGNOSIS — Z5112 Encounter for antineoplastic immunotherapy: Secondary | ICD-10-CM | POA: Diagnosis not present

## 2015-02-27 DIAGNOSIS — F419 Anxiety disorder, unspecified: Secondary | ICD-10-CM | POA: Diagnosis not present

## 2015-02-27 DIAGNOSIS — I10 Essential (primary) hypertension: Secondary | ICD-10-CM

## 2015-02-27 LAB — COMPREHENSIVE METABOLIC PANEL (CC13)
ALBUMIN: 4.3 g/dL (ref 3.5–5.0)
ALK PHOS: 68 U/L (ref 40–150)
ALT: 18 U/L (ref 0–55)
ANION GAP: 9 meq/L (ref 3–11)
AST: 22 U/L (ref 5–34)
BILIRUBIN TOTAL: 3.69 mg/dL — AB (ref 0.20–1.20)
BUN: 12.8 mg/dL (ref 7.0–26.0)
CALCIUM: 9.5 mg/dL (ref 8.4–10.4)
CO2: 26 mEq/L (ref 22–29)
Chloride: 106 mEq/L (ref 98–109)
Creatinine: 0.8 mg/dL (ref 0.7–1.3)
GLUCOSE: 91 mg/dL (ref 70–140)
POTASSIUM: 4 meq/L (ref 3.5–5.1)
Sodium: 141 mEq/L (ref 136–145)
TOTAL PROTEIN: 6.8 g/dL (ref 6.4–8.3)

## 2015-02-27 LAB — CBC WITH DIFFERENTIAL/PLATELET
BASO%: 1.3 % (ref 0.0–2.0)
BASOS ABS: 0.1 10*3/uL (ref 0.0–0.1)
EOS ABS: 0.4 10*3/uL (ref 0.0–0.5)
EOS%: 8.8 % — AB (ref 0.0–7.0)
HCT: 40.1 % (ref 38.4–49.9)
HGB: 13.4 g/dL (ref 13.0–17.1)
LYMPH%: 25 % (ref 14.0–49.0)
MCH: 36 pg — AB (ref 27.2–33.4)
MCHC: 33.4 g/dL (ref 32.0–36.0)
MCV: 107.6 fL — AB (ref 79.3–98.0)
MONO#: 0.4 10*3/uL (ref 0.1–0.9)
MONO%: 8.2 % (ref 0.0–14.0)
NEUT#: 2.8 10*3/uL (ref 1.5–6.5)
NEUT%: 56.7 % (ref 39.0–75.0)
Platelets: 125 10*3/uL — ABNORMAL LOW (ref 140–400)
RBC: 3.72 10*6/uL — AB (ref 4.20–5.82)
RDW: 13.5 % (ref 11.0–14.6)
WBC: 5 10*3/uL (ref 4.0–10.3)
lymph#: 1.2 10*3/uL (ref 0.9–3.3)

## 2015-02-27 LAB — LACTATE DEHYDROGENASE (CC13): LDH: 225 U/L (ref 125–245)

## 2015-02-27 LAB — TECHNOLOGIST REVIEW

## 2015-02-27 MED ORDER — SODIUM CHLORIDE 0.9 % IV SOLN
Freq: Once | INTRAVENOUS | Status: AC
  Administered 2015-02-27: 10:00:00 via INTRAVENOUS

## 2015-02-27 MED ORDER — SODIUM CHLORIDE 0.9 % IV SOLN
900.0000 mg | Freq: Once | INTRAVENOUS | Status: AC
  Administered 2015-02-27: 900 mg via INTRAVENOUS
  Filled 2015-02-27: qty 90

## 2015-02-27 NOTE — Telephone Encounter (Signed)
No pof sent today. Patient on q2w scheduled and has appointments for lab/fu/tx 9/14 and 9/28. Gave patient avs report and appointments for September.

## 2015-02-27 NOTE — Progress Notes (Signed)
Spoke with Awilda Metro PA about bilirubin level of 3.69. Per PA, okay to treat today.

## 2015-02-27 NOTE — Progress Notes (Addendum)
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   PRIOR THERAPY:  1) Tapered dose of prednisone. 2) High dose of IVIG   CURRENT THERAPY: Soliris (Eculizumab) induction with 600 mg IV weekly 4 weeks followed by maintenance treatment at a dose of 900 mg IV every 2 weeks. First dose of treatment is expected on 11/21/2014. Status post 3 cycles. He is here today to start day 15 of cycle #4.  INTERVAL HISTORY: Travis Mcdaniel 44 y.o. male returns to the clinic today for follow-up visit. He is tolerating his treatment with Soliris fairly well with no significant adverse effects. His total bilirubin has been fluctuating. He does admit to drinking a couple of beers at least 3 days a week. He denied having any significant fever or chills. He has no nausea or vomiting. He denied having any significant chest pain, shortness of breath, cough or hemoptysis. He has no significant weight loss or night sweats. He denies any hematuria.  He has not established care with a primary care physician.  MEDICAL HISTORY: Past Medical History  Diagnosis Date  . Diverticulosis     ALLERGIES:  has No Known Allergies.  MEDICATIONS:  Current Outpatient Prescriptions  Medication Sig Dispense Refill  . LORazepam (ATIVAN) 0.5 MG tablet Take 1 tablet (0.5 mg total) by mouth at bedtime as needed for anxiety. 30 tablet 0  . Multiple Vitamin (MULTIVITAMIN WITH MINERALS) TABS tablet Take 1 tablet by mouth daily.    . Psyllium (METAMUCIL PO) Take 1 capsule by mouth daily.     No current facility-administered medications for this visit.   Facility-Administered Medications Ordered in Other Visits  Medication Dose Route Frequency Provider Last Rate Last Dose    . eculizumab (SOLIRIS) 900 mg in sodium chloride 0.9 % 90 mL infusion  900 mg Intravenous Once Curt Bears, MD        SURGICAL HISTORY: History reviewed. No pertinent past surgical history.  REVIEW OF SYSTEMS:  Constitutional: negative Eyes: negative Ears, nose, mouth, throat, and face: negative Respiratory: negative Cardiovascular: negative Gastrointestinal: negative Genitourinary:negative Integument/breast: negative Hematologic/lymphatic: negative Musculoskeletal:positive for back pain Neurological: negative Behavioral/Psych: positive for anxiety Endocrine: negative Allergic/Immunologic: negative   PHYSICAL EXAMINATION: General appearance: alert, cooperative and no distress Head: Normocephalic, without obvious abnormality, atraumatic Neck: no adenopathy, no JVD, supple, symmetrical, trachea midline and thyroid not enlarged, symmetric, no tenderness/mass/nodules Lymph nodes: Cervical, supraclavicular, and axillary nodes normal. Resp: clear to auscultation bilaterally Back: symmetric, no curvature. ROM normal. No CVA tenderness. Cardio: regular rate and rhythm, S1, S2 normal, no murmur, click, rub or gallop GI: soft, non-tender; bowel sounds normal; no masses,  no organomegaly Extremities: extremities normal, atraumatic, no cyanosis or edema Neurologic: Alert and oriented X 3, normal strength and tone. Normal symmetric reflexes. Normal coordination and gait  ECOG PERFORMANCE STATUS: 1 - Symptomatic but completely ambulatory  Blood pressure 137/72, pulse 76, temperature 98.3 F (36.8 C), temperature source Oral, resp. rate 18, height 5\' 6"  (1.676 m), weight 158 lb 6.4 oz (71.85 kg), SpO2 100 %.  LABORATORY DATA: Lab Results  Component Value Date   WBC 5.0 02/27/2015   HGB 13.4 02/27/2015   HCT 40.1 02/27/2015   MCV 107.6* 02/27/2015   PLT 125* 02/27/2015  Chemistry      Component Value Date/Time   NA 141 02/27/2015 0803   NA 140 09/07/2014 0407   K 4.0  02/27/2015 0803   K 3.9 09/07/2014 0407   CL 110 09/07/2014 0407   CO2 26 02/27/2015 0803   CO2 22 09/07/2014 0407   BUN 12.8 02/27/2015 0803   BUN 12 09/07/2014 0407   CREATININE 0.8 02/27/2015 0803   CREATININE 0.99 09/07/2014 0407      Component Value Date/Time   CALCIUM 9.5 02/27/2015 0803   CALCIUM 8.3* 09/07/2014 0407   ALKPHOS 68 02/27/2015 0803   ALKPHOS 30* 09/07/2014 0407   AST 22 02/27/2015 0803   AST 120* 09/07/2014 0407   ALT 18 02/27/2015 0803   ALT 27 09/07/2014 0407   BILITOT 3.69* 02/27/2015 0803   BILITOT 0.7 09/07/2014 0407       RADIOGRAPHIC STUDIES: No results found.  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 He is currently on treatment with Soliris. He is status post 3 cycles of treatment as well as day 1 of cycle #4.  The patient is tolerating his treatment fairly well. The patient was discussed with and also seen by Dr. Julien Nordmann. He is encouraged to abstain from drinking alcohol. We will recheck a DAT  and haptoglobin Recommended for him to continue his treatment as a scheduled. He will return for a follow-up visit in 2 weeks for reevaluation. For anxiety, the patient will continue on Ativan as prescribed. For hypertension, the patient is again encouraged to establish care with a primary care physician for treatment of his condition. The patient was advised to call immediately if he has any concerning symptoms in the interval. The patient voices understanding of current disease status and treatment options and is in agreement with the current care plan.  All questions were answered. The patient knows to call the clinic with any problems, questions or concerns. We can certainly see the patient much sooner if necessary.  Carlton Adam [name]   ADDENDUM: Hematology/Oncology  Attending: I had a face to face encounter with the patient. I recommended his care plan. This is a very pleasant 44 years old white male with paroxysmal nocturnal hemoglobinuria (PNH) currently undergoing treatment with Soliris 900 MG/M2 every 2 weeks. The patient has been tolerating his treatment well with no specific complaints. His serum.bilirubin still elevated but the patient has been drinking too much alcohol recently. I strongly encouraged him to quit drinking alcohol at regular basis. I will check his hemolytic anemia panel today. I recommended for the patient to continue his current treatment with sclerae as a scheduled. He would come back for follow-up visit in 2 weeks for reevaluation before the next dose of his treatment. For anxiety, the patient was given a refill of Ativan. For hypertension the patient was encouraged to schedule a visit with her primary care physician for evaluation and management of his condition. He was advised to call immediately if he has any concerning symptoms in the interval.  Disclaimer: This note was dictated with voice recognition software. Similar sounding words can inadvertently be transcribed and may not be corrected upon review. Eilleen Kempf., MD 03/03/2015

## 2015-02-27 NOTE — Patient Instructions (Addendum)
[place] Discharge Instructions for Patients Receiving Chemotherapy  Today you received the following chemotherapy agents soliris  To help prevent nausea and vomiting after your treatment, we encourage you to take your nausea medication   If you develop nausea and vomiting that is not controlled by your nausea medication, call the clinic.   BELOW ARE SYMPTOMS THAT SHOULD BE REPORTED IMMEDIATELY:  *FEVER GREATER THAN 100.5 F  *CHILLS WITH OR WITHOUT FEVER  NAUSEA AND VOMITING THAT IS NOT CONTROLLED WITH YOUR NAUSEA MEDICATION  *UNUSUAL SHORTNESS OF BREATH  *UNUSUAL BRUISING OR BLEEDING  TENDERNESS IN MOUTH AND THROAT WITH OR WITHOUT PRESENCE OF ULCERS  *URINARY PROBLEMS  *BOWEL PROBLEMS  UNUSUAL RASH Items with * indicate a potential emergency and should be followed up as soon as possible.  Feel free to call the clinic you have any questions or concerns. The clinic phone number is [phone].  Please show the [place] at check-in to the Emergency Department and triage nurse.

## 2015-02-28 LAB — DIRECT ANTIGLOBULIN RFX ANTI-C3/IGG: DAT, Polyspecific: POSITIVE — AB

## 2015-02-28 LAB — HAPTOGLOBIN

## 2015-03-01 NOTE — Patient Instructions (Signed)
Stop drinking alcohol Follow up in 2 weeks

## 2015-03-13 ENCOUNTER — Encounter: Payer: Self-pay | Admitting: Internal Medicine

## 2015-03-13 ENCOUNTER — Ambulatory Visit (HOSPITAL_BASED_OUTPATIENT_CLINIC_OR_DEPARTMENT_OTHER): Payer: Commercial Managed Care - HMO | Admitting: Internal Medicine

## 2015-03-13 ENCOUNTER — Other Ambulatory Visit (HOSPITAL_BASED_OUTPATIENT_CLINIC_OR_DEPARTMENT_OTHER): Payer: Commercial Managed Care - HMO

## 2015-03-13 ENCOUNTER — Ambulatory Visit (HOSPITAL_BASED_OUTPATIENT_CLINIC_OR_DEPARTMENT_OTHER): Payer: Commercial Managed Care - HMO

## 2015-03-13 ENCOUNTER — Telehealth: Payer: Self-pay | Admitting: Internal Medicine

## 2015-03-13 VITALS — BP 138/73 | HR 78 | Temp 98.1°F | Resp 18 | Ht 66.0 in | Wt 161.5 lb

## 2015-03-13 DIAGNOSIS — Z5112 Encounter for antineoplastic immunotherapy: Secondary | ICD-10-CM | POA: Diagnosis not present

## 2015-03-13 DIAGNOSIS — I10 Essential (primary) hypertension: Secondary | ICD-10-CM | POA: Diagnosis not present

## 2015-03-13 DIAGNOSIS — D595 Paroxysmal nocturnal hemoglobinuria [Marchiafava-Micheli]: Secondary | ICD-10-CM

## 2015-03-13 DIAGNOSIS — D589 Hereditary hemolytic anemia, unspecified: Secondary | ICD-10-CM

## 2015-03-13 DIAGNOSIS — F419 Anxiety disorder, unspecified: Secondary | ICD-10-CM | POA: Diagnosis not present

## 2015-03-13 LAB — CBC WITH DIFFERENTIAL/PLATELET
BASO%: 0.4 % (ref 0.0–2.0)
Basophils Absolute: 0 10*3/uL (ref 0.0–0.1)
EOS%: 6.6 % (ref 0.0–7.0)
Eosinophils Absolute: 0.3 10*3/uL (ref 0.0–0.5)
HCT: 38 % — ABNORMAL LOW (ref 38.4–49.9)
HGB: 12.8 g/dL — ABNORMAL LOW (ref 13.0–17.1)
LYMPH%: 32.4 % (ref 14.0–49.0)
MCH: 35.9 pg — ABNORMAL HIGH (ref 27.2–33.4)
MCHC: 33.7 g/dL (ref 32.0–36.0)
MCV: 106.4 fL — AB (ref 79.3–98.0)
MONO#: 0.4 10*3/uL (ref 0.1–0.9)
MONO%: 8.1 % (ref 0.0–14.0)
NEUT%: 52.5 % (ref 39.0–75.0)
NEUTROS ABS: 2.5 10*3/uL (ref 1.5–6.5)
Platelets: 135 10*3/uL — ABNORMAL LOW (ref 140–400)
RBC: 3.57 10*6/uL — AB (ref 4.20–5.82)
RDW: 13.8 % (ref 11.0–14.6)
WBC: 4.7 10*3/uL (ref 4.0–10.3)
lymph#: 1.5 10*3/uL (ref 0.9–3.3)

## 2015-03-13 LAB — COMPREHENSIVE METABOLIC PANEL (CC13)
ALBUMIN: 4.3 g/dL (ref 3.5–5.0)
ALK PHOS: 66 U/L (ref 40–150)
ALT: 35 U/L (ref 0–55)
AST: 29 U/L (ref 5–34)
Anion Gap: 9 mEq/L (ref 3–11)
BILIRUBIN TOTAL: 2.2 mg/dL — AB (ref 0.20–1.20)
BUN: 11.5 mg/dL (ref 7.0–26.0)
CALCIUM: 9.4 mg/dL (ref 8.4–10.4)
CO2: 25 mEq/L (ref 22–29)
CREATININE: 0.8 mg/dL (ref 0.7–1.3)
Chloride: 108 mEq/L (ref 98–109)
EGFR: >90 mL/min/{1.73_m2} (ref >90)
GLUCOSE: 89 mg/dL (ref 70–140)
Potassium: 4 mEq/L (ref 3.5–5.1)
SODIUM: 141 meq/L (ref 136–145)
TOTAL PROTEIN: 6.8 g/dL (ref 6.4–8.3)

## 2015-03-13 LAB — LACTATE DEHYDROGENASE (CC13): LDH: 224 U/L (ref 125–245)

## 2015-03-13 MED ORDER — SODIUM CHLORIDE 0.9 % IV SOLN
Freq: Once | INTRAVENOUS | Status: AC
  Administered 2015-03-13: 12:00:00 via INTRAVENOUS

## 2015-03-13 MED ORDER — SODIUM CHLORIDE 0.9 % IV SOLN
900.0000 mg | Freq: Once | INTRAVENOUS | Status: AC
  Administered 2015-03-13: 900 mg via INTRAVENOUS
  Filled 2015-03-13: qty 90

## 2015-03-13 MED ORDER — LORAZEPAM 0.5 MG PO TABS: 0.5000 mg | ORAL_TABLET | Freq: Every evening | ORAL | Status: DC | PRN

## 2015-03-13 NOTE — Progress Notes (Signed)
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   PRIOR THERAPY:  1) Tapered dose of prednisone. 2) High dose of IVIG   CURRENT THERAPY: Soliris (Eculizumab) induction with 600 mg IV weekly 4 weeks followed by maintenance treatment at a dose of 900 mg IV every 2 weeks. First dose of treatment is expected on 11/21/2014. Status post 4 cycles. He is here today to start day 1 of cycle #5.  INTERVAL HISTORY: Travis Mcdaniel 44 y.o. male returns to the clinic today for follow-up visit. He is tolerating his treatment with Soliris fairly well with no significant adverse effects. He denied having any significant fever or chills. He has no nausea or vomiting. He denied having any significant chest pain, shortness of breath, cough or hemoptysis. He has no significant weight loss or night sweats. He continues to have anxiety and would like refill of Ativan. He has not established care with a primary care physician and requested referral to Dr. [name].  The patient did cut down on the amount of alcohol he is drinking.  He is here today to start cycle #5 of his treatment.  MEDICAL HISTORY: Past Medical History  Diagnosis Date  . Diverticulosis     ALLERGIES:  has No Known Allergies.  MEDICATIONS:  Current Outpatient Prescriptions  Medication Sig Dispense Refill  . LORazepam (ATIVAN) 0.5 MG tablet Take 1 tablet (0.5 mg total) by mouth at bedtime as needed for anxiety. 30 tablet 0  . Psyllium (METAMUCIL PO) Take 1 capsule by mouth daily.    . Multiple Vitamin (MULTIVITAMIN WITH MINERALS) TABS tablet Take 1 tablet by mouth daily.     No current facility-administered medications for this visit.    SURGICAL HISTORY: History reviewed. No  pertinent past surgical history.  REVIEW OF SYSTEMS:  Constitutional: negative Eyes: negative Ears, nose, mouth, throat, and face: negative Respiratory: negative Cardiovascular: negative Gastrointestinal: negative Genitourinary:negative Integument/breast: negative Hematologic/lymphatic: negative Musculoskeletal:positive for back pain Neurological: negative Behavioral/Psych: positive for anxiety Endocrine: negative Allergic/Immunologic: negative   PHYSICAL EXAMINATION: General appearance: alert, cooperative and no distress Head: Normocephalic, without obvious abnormality, atraumatic Neck: no adenopathy, no JVD, supple, symmetrical, trachea midline and thyroid not enlarged, symmetric, no tenderness/mass/nodules Lymph nodes: Cervical, supraclavicular, and axillary nodes normal. Resp: clear to auscultation bilaterally Back: symmetric, no curvature. ROM normal. No CVA tenderness. Cardio: regular rate and rhythm, S1, S2 normal, no murmur, click, rub or gallop GI: soft, non-tender; bowel sounds normal; no masses,  no organomegaly Extremities: extremities normal, atraumatic, no cyanosis or edema Neurologic: Alert and oriented X 3, normal strength and tone. Normal symmetric reflexes. Normal coordination and gait  ECOG PERFORMANCE STATUS: 1 - Symptomatic but completely ambulatory  Blood pressure 138/73, pulse 78, temperature 98.1 F (36.7 C), temperature source Oral, resp. rate 18, height 5\' 6"  (1.676 m), weight 161 lb 8 oz (73.256 kg), SpO2 100 %.  LABORATORY DATA: Lab Results  Component Value Date   WBC 4.7 03/13/2015   HGB 12.8* 03/13/2015   HCT 38.0* 03/13/2015   MCV 106.4* 03/13/2015   PLT 135* 03/13/2015      Chemistry      Component Value Date/Time   NA 141 03/13/2015 1032   NA 140 09/07/2014 0407   K 4.0 03/13/2015 1032  K 3.9 09/07/2014 0407   CL 110 09/07/2014 0407   CO2 25 03/13/2015 1032   CO2 22 09/07/2014 0407   BUN 11.5 03/13/2015 1032   BUN 12 09/07/2014  0407   CREATININE 0.8 03/13/2015 1032   CREATININE 0.99 09/07/2014 0407      Component Value Date/Time   CALCIUM 9.4 03/13/2015 1032   CALCIUM 8.3* 09/07/2014 0407   ALKPHOS 66 03/13/2015 1032   ALKPHOS 30* 09/07/2014 0407   AST 29 03/13/2015 1032   AST 120* 09/07/2014 0407   ALT 35 03/13/2015 1032   ALT 27 09/07/2014 0407   BILITOT 2.20* 03/13/2015 1032   BILITOT 0.7 09/07/2014 0407       RADIOGRAPHIC STUDIES: No results found.  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 He is currently on treatment with Soliris. He is status post 4 cycles of treatment.  The patient is tolerating his treatment fairly well. I recommended for him to continue his treatment as a scheduled.  He will start cycle #5 today.He would come back for follow-up visit in 4 weeks for reevaluation. For anxiety, the patient was given a refill of Ativan. For hypertension, I strongly advised the patient to establish care with a primary care physician for treatment of his condition. I requested a referral for him to see Dr. [name]. The patient was advised to call immediately if he has any concerning symptoms in the interval. The patient voices understanding of current disease status and treatment options and is in agreement with the current care plan.  All questions were answered. The patient knows to call the clinic with any problems, questions or concerns. We can certainly see the patient much sooner if necessary.  Disclaimer: This note was dictated with voice recognition software. Similar sounding words can inadvertently be transcribed and may not be corrected upon review.

## 2015-03-13 NOTE — Patient Instructions (Signed)
[facility] Discharge Instructions for Patients Receiving Chemotherapy  Today you received the following chemotherapy agents:  Soliris.  To help prevent nausea and vomiting after your treatment, we encourage you to take your nausea medication as directed.   If you develop nausea and vomiting that is not controlled by your nausea medication, call the clinic.   BELOW ARE SYMPTOMS THAT SHOULD BE REPORTED IMMEDIATELY:  *FEVER GREATER THAN 100.5 F  *CHILLS WITH OR WITHOUT FEVER  NAUSEA AND VOMITING THAT IS NOT CONTROLLED WITH YOUR NAUSEA MEDICATION  *UNUSUAL SHORTNESS OF BREATH  *UNUSUAL BRUISING OR BLEEDING  TENDERNESS IN MOUTH AND THROAT WITH OR WITHOUT PRESENCE OF ULCERS  *URINARY PROBLEMS  *BOWEL PROBLEMS  UNUSUAL RASH Items with * indicate a potential emergency and should be followed up as soon as possible.  Feel free to call the clinic you have any questions or concerns. The clinic phone number is [phone].  Please show the CHEMO ALERT CARD at check-in to the Emergency Department and triage nurse.   

## 2015-03-13 NOTE — Telephone Encounter (Signed)
Gave adn printed appt sched and avs for pt for Sept thru NOV °

## 2015-03-13 NOTE — Progress Notes (Signed)
Pt observed for 1 hour post infusion of Soliris.  No questions or concerns at time of discharge.  Pt informed to call with any questions or concerns should they arise.

## 2015-03-27 ENCOUNTER — Ambulatory Visit: Payer: Commercial Managed Care - HMO | Admitting: Physician Assistant

## 2015-03-27 ENCOUNTER — Ambulatory Visit (HOSPITAL_BASED_OUTPATIENT_CLINIC_OR_DEPARTMENT_OTHER): Payer: Commercial Managed Care - HMO

## 2015-03-27 ENCOUNTER — Other Ambulatory Visit (HOSPITAL_BASED_OUTPATIENT_CLINIC_OR_DEPARTMENT_OTHER): Payer: Commercial Managed Care - HMO

## 2015-03-27 ENCOUNTER — Other Ambulatory Visit: Payer: Commercial Managed Care - HMO

## 2015-03-27 VITALS — BP 151/72 | HR 73 | Temp 98.3°F | Resp 20

## 2015-03-27 DIAGNOSIS — D595 Paroxysmal nocturnal hemoglobinuria [Marchiafava-Micheli]: Secondary | ICD-10-CM

## 2015-03-27 DIAGNOSIS — Z5112 Encounter for antineoplastic immunotherapy: Secondary | ICD-10-CM

## 2015-03-27 LAB — COMPREHENSIVE METABOLIC PANEL (CC13)
ALBUMIN: 4.4 g/dL (ref 3.5–5.0)
ALK PHOS: 74 U/L (ref 40–150)
ALT: 19 U/L (ref 0–55)
AST: 24 U/L (ref 5–34)
Anion Gap: 7 mEq/L (ref 3–11)
BILIRUBIN TOTAL: 2.06 mg/dL — AB (ref 0.20–1.20)
BUN: 12.1 mg/dL (ref 7.0–26.0)
CO2: 26 meq/L (ref 22–29)
CREATININE: 0.8 mg/dL (ref 0.7–1.3)
Calcium: 9.4 mg/dL (ref 8.4–10.4)
Chloride: 108 mEq/L (ref 98–109)
GLUCOSE: 88 mg/dL (ref 70–140)
Potassium: 3.9 mEq/L (ref 3.5–5.1)
SODIUM: 142 meq/L (ref 136–145)
TOTAL PROTEIN: 7 g/dL (ref 6.4–8.3)

## 2015-03-27 LAB — CBC WITH DIFFERENTIAL/PLATELET
BASO%: 1.3 % (ref 0.0–2.0)
Basophils Absolute: 0.1 10*3/uL (ref 0.0–0.1)
EOS ABS: 0.4 10*3/uL (ref 0.0–0.5)
EOS%: 7.1 % — AB (ref 0.0–7.0)
HCT: 39.9 % (ref 38.4–49.9)
HGB: 13.3 g/dL (ref 13.0–17.1)
LYMPH%: 27.4 % (ref 14.0–49.0)
MCH: 35.5 pg — AB (ref 27.2–33.4)
MCHC: 33.4 g/dL (ref 32.0–36.0)
MCV: 106.4 fL — AB (ref 79.3–98.0)
MONO#: 0.4 10*3/uL (ref 0.1–0.9)
MONO%: 8.4 % (ref 0.0–14.0)
NEUT%: 55.8 % (ref 39.0–75.0)
NEUTROS ABS: 3 10*3/uL (ref 1.5–6.5)
Platelets: 144 10*3/uL (ref 140–400)
RBC: 3.75 10*6/uL — AB (ref 4.20–5.82)
RDW: 14 % (ref 11.0–14.6)
WBC: 5.3 10*3/uL (ref 4.0–10.3)
lymph#: 1.5 10*3/uL (ref 0.9–3.3)

## 2015-03-27 LAB — LACTATE DEHYDROGENASE (CC13): LDH: 232 U/L (ref 125–245)

## 2015-03-27 MED ORDER — SODIUM CHLORIDE 0.9 % IV SOLN
900.0000 mg | Freq: Once | INTRAVENOUS | Status: AC
  Administered 2015-03-27: 900 mg via INTRAVENOUS
  Filled 2015-03-27: qty 90

## 2015-03-27 MED ORDER — SODIUM CHLORIDE 0.9 % IV SOLN
Freq: Once | INTRAVENOUS | Status: AC
  Administered 2015-03-27: 13:00:00 via INTRAVENOUS

## 2015-03-27 NOTE — Patient Instructions (Signed)
[facility] Discharge Instructions for Patients Receiving Chemotherapy  Today you received the following chemotherapy agents:  Soliris  To help prevent nausea and vomiting after your treatment, we encourage you to take your nausea medication as ordered per MD.    If you develop nausea and vomiting that is not controlled by your nausea medication, call the clinic.   BELOW ARE SYMPTOMS THAT SHOULD BE REPORTED IMMEDIATELY:  *FEVER GREATER THAN 100.5 F  *CHILLS WITH OR WITHOUT FEVER  NAUSEA AND VOMITING THAT IS NOT CONTROLLED WITH YOUR NAUSEA MEDICATION  *UNUSUAL SHORTNESS OF BREATH  *UNUSUAL BRUISING OR BLEEDING  TENDERNESS IN MOUTH AND THROAT WITH OR WITHOUT PRESENCE OF ULCERS  *URINARY PROBLEMS  *BOWEL PROBLEMS  UNUSUAL RASH Items with * indicate a potential emergency and should be followed up as soon as possible.  Feel free to call the clinic you have any questions or concerns. The clinic phone number is [phone].  Please show the CHEMO ALERT CARD at check-in to the Emergency Department and triage nurse.   

## 2015-04-10 ENCOUNTER — Ambulatory Visit (HOSPITAL_BASED_OUTPATIENT_CLINIC_OR_DEPARTMENT_OTHER): Payer: Commercial Managed Care - HMO

## 2015-04-10 ENCOUNTER — Other Ambulatory Visit (HOSPITAL_BASED_OUTPATIENT_CLINIC_OR_DEPARTMENT_OTHER): Payer: Commercial Managed Care - HMO

## 2015-04-10 ENCOUNTER — Ambulatory Visit (HOSPITAL_BASED_OUTPATIENT_CLINIC_OR_DEPARTMENT_OTHER): Payer: Commercial Managed Care - HMO | Admitting: Nurse Practitioner

## 2015-04-10 VITALS — BP 125/71 | HR 72 | Temp 97.4°F | Resp 18 | Ht 66.0 in | Wt 165.7 lb

## 2015-04-10 DIAGNOSIS — D595 Paroxysmal nocturnal hemoglobinuria [Marchiafava-Micheli]: Secondary | ICD-10-CM | POA: Diagnosis not present

## 2015-04-10 DIAGNOSIS — Z5112 Encounter for antineoplastic immunotherapy: Secondary | ICD-10-CM | POA: Diagnosis not present

## 2015-04-10 LAB — CBC WITH DIFFERENTIAL/PLATELET
BASO%: 0.4 % (ref 0.0–2.0)
BASOS ABS: 0 10*3/uL (ref 0.0–0.1)
EOS%: 6.7 % (ref 0.0–7.0)
Eosinophils Absolute: 0.3 10*3/uL (ref 0.0–0.5)
HEMATOCRIT: 39.3 % (ref 38.4–49.9)
HGB: 13.1 g/dL (ref 13.0–17.1)
LYMPH%: 25.5 % (ref 14.0–49.0)
MCH: 35.9 pg — AB (ref 27.2–33.4)
MCHC: 33.3 g/dL (ref 32.0–36.0)
MCV: 107.7 fL — ABNORMAL HIGH (ref 79.3–98.0)
MONO#: 0.4 10*3/uL (ref 0.1–0.9)
MONO%: 8.4 % (ref 0.0–14.0)
NEUT#: 2.9 10*3/uL (ref 1.5–6.5)
NEUT%: 59 % (ref 39.0–75.0)
Platelets: 129 10*3/uL — ABNORMAL LOW (ref 140–400)
RBC: 3.65 10*6/uL — ABNORMAL LOW (ref 4.20–5.82)
RDW: 14.8 % — ABNORMAL HIGH (ref 11.0–14.6)
WBC: 4.9 10*3/uL (ref 4.0–10.3)
lymph#: 1.3 10*3/uL (ref 0.9–3.3)

## 2015-04-10 LAB — COMPREHENSIVE METABOLIC PANEL (CC13)
ALBUMIN: 4.4 g/dL (ref 3.5–5.0)
ALK PHOS: 75 U/L (ref 40–150)
ALT: 17 U/L (ref 0–55)
AST: 24 U/L (ref 5–34)
Anion Gap: 10 mEq/L (ref 3–11)
BILIRUBIN TOTAL: 2.84 mg/dL — AB (ref 0.20–1.20)
BUN: 14.6 mg/dL (ref 7.0–26.0)
CO2: 24 meq/L (ref 22–29)
CREATININE: 0.8 mg/dL (ref 0.7–1.3)
Calcium: 9.3 mg/dL (ref 8.4–10.4)
Chloride: 105 mEq/L (ref 98–109)
GLUCOSE: 82 mg/dL (ref 70–140)
Potassium: 4.1 mEq/L (ref 3.5–5.1)
SODIUM: 139 meq/L (ref 136–145)
TOTAL PROTEIN: 6.9 g/dL (ref 6.4–8.3)

## 2015-04-10 LAB — LACTATE DEHYDROGENASE (CC13): LDH: 255 U/L — AB (ref 125–245)

## 2015-04-10 MED ORDER — SODIUM CHLORIDE 0.9 % IV SOLN
900.0000 mg | Freq: Once | INTRAVENOUS | Status: AC
  Administered 2015-04-10: 900 mg via INTRAVENOUS
  Filled 2015-04-10: qty 90

## 2015-04-10 MED ORDER — SODIUM CHLORIDE 0.9 % IV SOLN
Freq: Once | INTRAVENOUS | Status: AC
  Administered 2015-04-10: 12:00:00 via INTRAVENOUS

## 2015-04-10 NOTE — Patient Instructions (Signed)
[facility] Discharge Instructions for Patients Receiving Chemotherapy  Today you received the following chemotherapy agents soliris  To help prevent nausea and vomiting after your treatment, we encourage you to take your nausea medication    If you develop nausea and vomiting that is not controlled by your nausea medication, call the clinic.   BELOW ARE SYMPTOMS THAT SHOULD BE REPORTED IMMEDIATELY:  *FEVER GREATER THAN 100.5 F  *CHILLS WITH OR WITHOUT FEVER  NAUSEA AND VOMITING THAT IS NOT CONTROLLED WITH YOUR NAUSEA MEDICATION  *UNUSUAL SHORTNESS OF BREATH  *UNUSUAL BRUISING OR BLEEDING  TENDERNESS IN MOUTH AND THROAT WITH OR WITHOUT PRESENCE OF ULCERS  *URINARY PROBLEMS  *BOWEL PROBLEMS  UNUSUAL RASH Items with * indicate a potential emergency and should be followed up as soon as possible.  Feel free to call the clinic you have any questions or concerns. The clinic phone number is [phone].  Please show the [place] at check-in to the Emergency Department and triage nurse.

## 2015-04-10 NOTE — Progress Notes (Signed)
  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   PRIOR THERAPY:  1) Tapered dose of prednisone. 2) High dose of IVIG   CURRENT THERAPY: Soliris (Eculizumab) induction with 600 mg IV weekly 4 weeks followed by maintenance treatment at a dose of 900 mg IV every 2 weeks. First dose of treatment on 11/21/2014. Status post 5 cycles. He is here today to start day 1 of cycle # 6.    INTERVAL HISTORY:    Travis Mcdaniel returns as scheduled. He continues every 2 week Eculizumab. He feels well. He denies nausea/vomiting. No mouth sores. No diarrhea. He reports a good appetite. He is gaining weight. No fever or cough. No shortness of breath. He has established care with Dr. [name]. He has started Xanax and notes improvement in his blood pressure.  Objective:  Vital signs in last 24 hours:  Blood pressure 125/71, pulse 72, temperature 97.4 F (36.3 C), temperature source Oral, resp. rate 18, height 5\' 6"  (1.676 m), weight 165 lb 11.2 oz (75.161 kg), SpO2 100 %.    HEENT:  No thrush or ulcers. Lymphatics:  No palpable cervical or supra clavicular lymph nodes. Resp:  Lungs clear bilaterally. Cardio:  Regular rate and rhythm. GI:  Abdomen soft and nontender. No hepatomegaly. Vascular:  No leg edema. Neuro:  Alert and oriented. Gait normal.  Skin:  No rash.    Lab Results:  Lab Results  Component Value Date   WBC 4.9 04/10/2015   HGB 13.1 04/10/2015   HCT 39.3 04/10/2015   MCV 107.7* 04/10/2015   PLT 129* 04/10/2015   NEUTROABS 2.9 04/10/2015    Imaging:  No results found.  Medications: I have reviewed the patient's current medications.  Assessment/Plan: 1. 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   Disposition: Travis Mcdaniel appears stable. The plan is to continue  every 2 week eculizumab. He will begin cycle 6 today. He will return for a follow-up visit in one month. He will contact the office in the interim with any problems.  Plan reviewed with Dr. Julien Nordmann.   Travis Mcdaniel ANP/GNP-BC   04/10/2015  10:44 AM

## 2015-04-10 NOTE — Addendum Note (Signed)
Addended by: Lenox Ponds E on: 04/10/2015 11:44 AM   Modules accepted: Medications

## 2015-04-24 ENCOUNTER — Other Ambulatory Visit (HOSPITAL_BASED_OUTPATIENT_CLINIC_OR_DEPARTMENT_OTHER): Payer: Commercial Managed Care - HMO

## 2015-04-24 ENCOUNTER — Ambulatory Visit (HOSPITAL_BASED_OUTPATIENT_CLINIC_OR_DEPARTMENT_OTHER): Payer: Commercial Managed Care - HMO

## 2015-04-24 VITALS — BP 140/78 | HR 80 | Temp 98.0°F | Resp 18

## 2015-04-24 DIAGNOSIS — Z5112 Encounter for antineoplastic immunotherapy: Secondary | ICD-10-CM

## 2015-04-24 DIAGNOSIS — D595 Paroxysmal nocturnal hemoglobinuria [Marchiafava-Micheli]: Secondary | ICD-10-CM

## 2015-04-24 LAB — CBC WITH DIFFERENTIAL/PLATELET
BASO%: 0.4 % (ref 0.0–2.0)
BASOS ABS: 0 10*3/uL (ref 0.0–0.1)
EOS ABS: 0.4 10*3/uL (ref 0.0–0.5)
EOS%: 7.5 % — ABNORMAL HIGH (ref 0.0–7.0)
HEMATOCRIT: 38.8 % (ref 38.4–49.9)
HEMOGLOBIN: 12.8 g/dL — AB (ref 13.0–17.1)
LYMPH#: 1.2 10*3/uL (ref 0.9–3.3)
LYMPH%: 24.4 % (ref 14.0–49.0)
MCH: 36.1 pg — AB (ref 27.2–33.4)
MCHC: 33 g/dL (ref 32.0–36.0)
MCV: 109.3 fL — ABNORMAL HIGH (ref 79.3–98.0)
MONO#: 0.5 10*3/uL (ref 0.1–0.9)
MONO%: 10 % (ref 0.0–14.0)
NEUT#: 2.9 10*3/uL (ref 1.5–6.5)
NEUT%: 57.7 % (ref 39.0–75.0)
PLATELETS: 142 10*3/uL (ref 140–400)
RBC: 3.55 10*6/uL — ABNORMAL LOW (ref 4.20–5.82)
RDW: 15.2 % — ABNORMAL HIGH (ref 11.0–14.6)
WBC: 5.1 10*3/uL (ref 4.0–10.3)

## 2015-04-24 LAB — COMPREHENSIVE METABOLIC PANEL (CC13)
ALBUMIN: 4.3 g/dL (ref 3.5–5.0)
ALK PHOS: 76 U/L (ref 40–150)
ALT: 19 U/L (ref 0–55)
AST: 22 U/L (ref 5–34)
Anion Gap: 9 mEq/L (ref 3–11)
BILIRUBIN TOTAL: 2.79 mg/dL — AB (ref 0.20–1.20)
BUN: 13.4 mg/dL (ref 7.0–26.0)
CALCIUM: 9.5 mg/dL (ref 8.4–10.4)
CO2: 23 mEq/L (ref 22–29)
Chloride: 106 mEq/L (ref 98–109)
Creatinine: 0.8 mg/dL (ref 0.7–1.3)
Glucose: 95 mg/dl (ref 70–140)
POTASSIUM: 4 meq/L (ref 3.5–5.1)
Sodium: 139 mEq/L (ref 136–145)
TOTAL PROTEIN: 7 g/dL (ref 6.4–8.3)

## 2015-04-24 LAB — LACTATE DEHYDROGENASE (CC13): LDH: 262 U/L — ABNORMAL HIGH (ref 125–245)

## 2015-04-24 MED ORDER — SODIUM CHLORIDE 0.9 % IV SOLN
900.0000 mg | Freq: Once | INTRAVENOUS | Status: AC
  Administered 2015-04-24: 900 mg via INTRAVENOUS
  Filled 2015-04-24: qty 90

## 2015-04-24 MED ORDER — SODIUM CHLORIDE 0.9 % IV SOLN
Freq: Once | INTRAVENOUS | Status: AC
  Administered 2015-04-24: 09:00:00 via INTRAVENOUS

## 2015-04-24 NOTE — Patient Instructions (Signed)
Eculizumab injection What is this medicine? ECULIZUMAB (ek yoo LYE zyoo mab) is a monoclonal antibody. It is used to treat a rare kind of anemia called paroxysmal nocturnal hemoglobinuria or PNH. It may help prevent the loss of blood in patients with PNH. This medicine may be used for other purposes; ask your health care provider or pharmacist if you have questions. What should I tell my health care provider before I take this medicine? They need to know if you have any of these conditions: -infection -not received meningococcal vaccine -an unusual or allergic reaction to this eculizumab, other medicines, foods, dyes, or preservatives -pregnant or trying to get pregnant -breast-feeding How should I use this medicine? This medicine is for infusion into a vein. It is given by a health care professional in a hospital or clinic setting. A special MedGuide will be given to you by the pharmacist with each prescription and refill. Be sure to read this information carefully each time. Talk to your pediatrician regarding the use of this medicine in children. Special care may be needed. Overdosage: If you think you have taken too much of this medicine contact a poison control center or emergency room at once. NOTE: This medicine is only for you. Do not share this medicine with others. What if I miss a dose? It is important not to miss your dose. Call your doctor or health care professional if you are unable to keep an appointment. What may interact with this medicine? Interactions are not expected. This list may not describe all possible interactions. Give your health care provider a list of all the medicines, herbs, non-prescription drugs, or dietary supplements you use. Also tell them if you smoke, drink alcohol, or use illegal drugs. Some items may interact with your medicine. What should I watch for while using this medicine? Your condition will be monitored carefully while you are receiving this  medicine. You will need regular blood work. Tell your doctor or healthcare professional if your symptoms do not start to get better or if they get worse. You may have sudden breakdown of your red blood cells after you stop taking this medicine. You will need to be followed by your doctor for 8 weeks or more after this therapy is complete. This medicine may decrease your body's ability to fight infection. Avoid being around people who are sick. Carry the Patient Safety Card given to you at all times. Seek medical help if you have any of the symptoms listed on the card. What side effects may I notice from receiving this medicine? Side effects that you should report to your doctor or health care professional as soon as possible: -allergic reactions like skin rash, itching or hives, swelling of the face, lips, or tongue -back pain -breathing problems -bruising, pinpoint red spots on the skin -confusion -eyes sensitive to light -fast, irregular heartbeat -fever, chills, or any other sign of infection -pain, swelling, warmth in the leg -pain, tingling, numbness in the hands or feet -problems with balance, talking, walking -seizures -stiff neck -unusually weak or tired -vomiting Side effects that usually do not require medical attention (report to your doctor or health care professional if they continue or are bothersome): -aches and pains -constipation -headache -nausea -runny nose or colds -sore throat This list may not describe all possible side effects. Call your doctor for medical advice about side effects. You may report side effects to FDA at 1-800-FDA-1088. Where should I keep my medicine? This drug is given in a hospital   or clinic and will not be stored at home. NOTE: This sheet is a summary. It may not cover all possible information. If you have questions about this medicine, talk to your doctor, pharmacist, or health care provider.    2016, Elsevier/Gold Standard. (2008-06-12  17:23:14)  

## 2015-05-08 ENCOUNTER — Telehealth: Payer: Self-pay | Admitting: *Deleted

## 2015-05-08 ENCOUNTER — Other Ambulatory Visit (HOSPITAL_BASED_OUTPATIENT_CLINIC_OR_DEPARTMENT_OTHER): Payer: Commercial Managed Care - HMO

## 2015-05-08 ENCOUNTER — Ambulatory Visit (HOSPITAL_BASED_OUTPATIENT_CLINIC_OR_DEPARTMENT_OTHER): Payer: Commercial Managed Care - HMO | Admitting: Internal Medicine

## 2015-05-08 ENCOUNTER — Encounter: Payer: Self-pay | Admitting: Internal Medicine

## 2015-05-08 ENCOUNTER — Telehealth: Payer: Self-pay | Admitting: Internal Medicine

## 2015-05-08 ENCOUNTER — Ambulatory Visit (HOSPITAL_BASED_OUTPATIENT_CLINIC_OR_DEPARTMENT_OTHER): Payer: Commercial Managed Care - HMO

## 2015-05-08 VITALS — BP 126/76 | HR 74 | Temp 98.7°F | Resp 18 | Ht 66.0 in | Wt 168.1 lb

## 2015-05-08 DIAGNOSIS — D595 Paroxysmal nocturnal hemoglobinuria [Marchiafava-Micheli]: Secondary | ICD-10-CM | POA: Diagnosis not present

## 2015-05-08 DIAGNOSIS — I10 Essential (primary) hypertension: Secondary | ICD-10-CM

## 2015-05-08 DIAGNOSIS — Z5112 Encounter for antineoplastic immunotherapy: Secondary | ICD-10-CM

## 2015-05-08 LAB — CBC WITH DIFFERENTIAL/PLATELET
BASO%: 0.6 % (ref 0.0–2.0)
BASOS ABS: 0 10*3/uL (ref 0.0–0.1)
EOS%: 7.8 % — AB (ref 0.0–7.0)
Eosinophils Absolute: 0.4 10*3/uL (ref 0.0–0.5)
HEMATOCRIT: 39.2 % (ref 38.4–49.9)
HEMOGLOBIN: 13 g/dL (ref 13.0–17.1)
LYMPH#: 1.3 10*3/uL (ref 0.9–3.3)
LYMPH%: 26 % (ref 14.0–49.0)
MCH: 36.2 pg — AB (ref 27.2–33.4)
MCHC: 33.2 g/dL (ref 32.0–36.0)
MCV: 109.2 fL — ABNORMAL HIGH (ref 79.3–98.0)
MONO#: 0.4 10*3/uL (ref 0.1–0.9)
MONO%: 8.8 % (ref 0.0–14.0)
NEUT%: 56.8 % (ref 39.0–75.0)
NEUTROS ABS: 2.8 10*3/uL (ref 1.5–6.5)
Platelets: 124 10*3/uL — ABNORMAL LOW (ref 140–400)
RBC: 3.59 10*6/uL — ABNORMAL LOW (ref 4.20–5.82)
RDW: 14.4 % (ref 11.0–14.6)
WBC: 4.9 10*3/uL (ref 4.0–10.3)

## 2015-05-08 LAB — COMPREHENSIVE METABOLIC PANEL (CC13)
ALBUMIN: 4.3 g/dL (ref 3.5–5.0)
ALK PHOS: 74 U/L (ref 40–150)
ALT: 21 U/L (ref 0–55)
AST: 24 U/L (ref 5–34)
Anion Gap: 11 mEq/L (ref 3–11)
BILIRUBIN TOTAL: 2.5 mg/dL — AB (ref 0.20–1.20)
BUN: 12.5 mg/dL (ref 7.0–26.0)
CALCIUM: 9.5 mg/dL (ref 8.4–10.4)
CO2: 23 mEq/L (ref 22–29)
CREATININE: 0.8 mg/dL (ref 0.7–1.3)
Chloride: 105 mEq/L (ref 98–109)
EGFR: >90 mL/min/{1.73_m2} (ref >90)
Glucose: 84 mg/dl (ref 70–140)
Potassium: 4.1 mEq/L (ref 3.5–5.1)
Sodium: 139 mEq/L (ref 136–145)
TOTAL PROTEIN: 7 g/dL (ref 6.4–8.3)

## 2015-05-08 LAB — LACTATE DEHYDROGENASE (CC13): LDH: 217 U/L (ref 125–245)

## 2015-05-08 MED ORDER — SODIUM CHLORIDE 0.9 % IV SOLN
Freq: Once | INTRAVENOUS | Status: AC
  Administered 2015-05-08: 10:00:00 via INTRAVENOUS

## 2015-05-08 MED ORDER — SODIUM CHLORIDE 0.9 % IV SOLN
900.0000 mg | Freq: Once | INTRAVENOUS | Status: AC
  Administered 2015-05-08: 900 mg via INTRAVENOUS
  Filled 2015-05-08: qty 90

## 2015-05-08 NOTE — Progress Notes (Signed)
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   PRIOR THERAPY:  1) Tapered dose of prednisone. 2) High dose of IVIG   CURRENT THERAPY: Soliris (Eculizumab) induction with 600 mg IV weekly 4 weeks followed by maintenance treatment at a dose of 900 mg IV every 2 weeks. First dose of treatment is expected on 11/21/2014. Status post 6 cycles. He is here today to start day 1 of cycle #7.  INTERVAL HISTORY: Travis Mcdaniel 44 y.o. male returns to the clinic today for follow-up visit. He is tolerating his treatment with Soliris fairly well with no significant adverse effects. He denied having any significant fever or chills. He has no nausea or vomiting. He denied having any significant chest pain, shortness of breath, cough or hemoptysis. He has no significant weight loss or night sweats. He continues to drink alcohol a few times a week.  He is here today to start cycle #7 of his treatment with Soliris.  MEDICAL HISTORY: Past Medical History  Diagnosis Date  . Diverticulosis     ALLERGIES:  has No Known Allergies.  MEDICATIONS:  Current Outpatient Prescriptions  Medication Sig Dispense Refill  . ALPRAZolam (XANAX) 0.5 MG tablet 3 (three) times daily as needed.     . Multiple Vitamin (MULTIVITAMIN WITH MINERALS) TABS tablet Take 1 tablet by mouth daily.    . Psyllium (METAMUCIL PO) Take 1 capsule by mouth daily.     No current facility-administered medications for this visit.    SURGICAL HISTORY: No past surgical history on file.  REVIEW OF SYSTEMS:  Constitutional: negative Eyes: negative Ears, nose, mouth, throat, and face: negative Respiratory: negative Cardiovascular: negative Gastrointestinal:  negative Genitourinary:negative Integument/breast: negative Hematologic/lymphatic: negative Musculoskeletal:positive for back pain Neurological: negative Behavioral/Psych: positive for anxiety Endocrine: negative Allergic/Immunologic: negative   PHYSICAL EXAMINATION: General appearance: alert, cooperative and no distress Head: Normocephalic, without obvious abnormality, atraumatic Neck: no adenopathy, no JVD, supple, symmetrical, trachea midline and thyroid not enlarged, symmetric, no tenderness/mass/nodules Lymph nodes: Cervical, supraclavicular, and axillary nodes normal. Resp: clear to auscultation bilaterally Back: symmetric, no curvature. ROM normal. No CVA tenderness. Cardio: regular rate and rhythm, S1, S2 normal, no murmur, click, rub or gallop GI: soft, non-tender; bowel sounds normal; no masses,  no organomegaly Extremities: extremities normal, atraumatic, no cyanosis or edema Neurologic: Alert and oriented X 3, normal strength and tone. Normal symmetric reflexes. Normal coordination and gait  ECOG PERFORMANCE STATUS: 1 - Symptomatic but completely ambulatory  Blood pressure 126/76, pulse 74, temperature 98.7 F (37.1 C), temperature source Oral, resp. rate 18, height 5\' 6"  (1.676 m), weight 168 lb 1.6 oz (76.25 kg), SpO2 100 %.  LABORATORY DATA: Lab Results  Component Value Date   WBC 4.9 05/08/2015   HGB 13.0 05/08/2015   HCT 39.2 05/08/2015   MCV 109.2* 05/08/2015   PLT 124* 05/08/2015      Chemistry      Component Value Date/Time   NA 139 05/08/2015 0755   NA 140 09/07/2014 0407   K 4.1 05/08/2015 0755   K 3.9 09/07/2014 0407   CL 110 09/07/2014 0407   CO2 23 05/08/2015 0755   CO2 22 09/07/2014 0407   BUN 12.5 05/08/2015 0755   BUN 12 09/07/2014 0407   CREATININE  0.8 05/08/2015 0755   CREATININE 0.99 09/07/2014 0407      Component Value Date/Time   CALCIUM 9.5 05/08/2015 0755   CALCIUM 8.3* 09/07/2014 0407   ALKPHOS 74 05/08/2015 0755   ALKPHOS 30*  09/07/2014 0407   AST 24 05/08/2015 0755   AST 120* 09/07/2014 0407   ALT 21 05/08/2015 0755   ALT 27 09/07/2014 0407   BILITOT 2.50* 05/08/2015 0755   BILITOT 0.7 09/07/2014 0407       RADIOGRAPHIC STUDIES: No results found.  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 He is currently on treatment with Soliris. He is status post 6 cycles of treatment.  The patient is tolerating his treatment fairly well. I recommended for him to continue his treatment as a scheduled.  He will start cycle #7 today.He would come back for follow-up visit in 4 weeks for reevaluation before starting cycle #8. For hypertension, he will be scheduled to see Dr. [name] later today. The patient was advised to call immediately if he has any concerning symptoms in the interval. The patient voices understanding of current disease status and treatment options and is in agreement with the current care plan.  All questions were answered. The patient knows to call the clinic with any problems, questions or concerns. We can certainly see the patient much sooner if necessary.  Disclaimer: This note was dictated with voice recognition software. Similar sounding words can inadvertently be transcribed and may not be corrected upon review.

## 2015-05-08 NOTE — Addendum Note (Signed)
Addended by: Lucile Crater on: 05/08/2015 09:27 AM   Modules accepted: Medications

## 2015-05-08 NOTE — Patient Instructions (Signed)
[facility] Discharge Instructions for Patients Receiving Chemotherapy  Today you received the following chemotherapy agents Solaris.   To help prevent nausea and vomiting after your treatment, we encourage you to take your nausea medication as directed.   If you develop nausea and vomiting that is not controlled by your nausea medication, call the clinic.   BELOW ARE SYMPTOMS THAT SHOULD BE REPORTED IMMEDIATELY:  *FEVER GREATER THAN 100.5 F  *CHILLS WITH OR WITHOUT FEVER  NAUSEA AND VOMITING THAT IS NOT CONTROLLED WITH YOUR NAUSEA MEDICATION  *UNUSUAL SHORTNESS OF BREATH  *UNUSUAL BRUISING OR BLEEDING  TENDERNESS IN MOUTH AND THROAT WITH OR WITHOUT PRESENCE OF ULCERS  *URINARY PROBLEMS  *BOWEL PROBLEMS  UNUSUAL RASH Items with * indicate a potential emergency and should be followed up as soon as possible.  Feel free to call the clinic you have any questions or concerns. The clinic phone number is [phone].  Please show the CHEMO ALERT CARD at check-in to the Emergency Department and triage nurse.   

## 2015-05-08 NOTE — Telephone Encounter (Signed)
Per staff message and POF I have scheduled appts. Advised scheduler of appts. JMW  

## 2015-05-08 NOTE — Telephone Encounter (Signed)
per pof to sch pt appt-gave pt copy of avs-sent MW emailt o sch pt trmt-pt to get updated b4 leaving

## 2015-05-22 ENCOUNTER — Ambulatory Visit (HOSPITAL_BASED_OUTPATIENT_CLINIC_OR_DEPARTMENT_OTHER): Payer: Commercial Managed Care - HMO

## 2015-05-22 ENCOUNTER — Other Ambulatory Visit (HOSPITAL_BASED_OUTPATIENT_CLINIC_OR_DEPARTMENT_OTHER): Payer: Commercial Managed Care - HMO

## 2015-05-22 VITALS — BP 142/82 | HR 66 | Temp 97.6°F | Resp 16

## 2015-05-22 DIAGNOSIS — Z5112 Encounter for antineoplastic immunotherapy: Secondary | ICD-10-CM | POA: Diagnosis not present

## 2015-05-22 DIAGNOSIS — D595 Paroxysmal nocturnal hemoglobinuria [Marchiafava-Micheli]: Secondary | ICD-10-CM | POA: Diagnosis not present

## 2015-05-22 LAB — COMPREHENSIVE METABOLIC PANEL (CC13)
ALT: 29 U/L (ref 0–55)
ANION GAP: 10 meq/L (ref 3–11)
AST: 36 U/L — ABNORMAL HIGH (ref 5–34)
Albumin: 4.2 g/dL (ref 3.5–5.0)
Alkaline Phosphatase: 81 U/L (ref 40–150)
BILIRUBIN TOTAL: 2.52 mg/dL — AB (ref 0.20–1.20)
BUN: 11.7 mg/dL (ref 7.0–26.0)
CO2: 23 meq/L (ref 22–29)
Calcium: 9.2 mg/dL (ref 8.4–10.4)
Chloride: 105 mEq/L (ref 98–109)
Creatinine: 0.8 mg/dL (ref 0.7–1.3)
GLUCOSE: 95 mg/dL (ref 70–140)
POTASSIUM: 3.9 meq/L (ref 3.5–5.1)
SODIUM: 139 meq/L (ref 136–145)
TOTAL PROTEIN: 6.8 g/dL (ref 6.4–8.3)

## 2015-05-22 LAB — CBC WITH DIFFERENTIAL/PLATELET
BASO%: 0.6 % (ref 0.0–2.0)
BASOS ABS: 0 10*3/uL (ref 0.0–0.1)
EOS ABS: 0.3 10*3/uL (ref 0.0–0.5)
EOS%: 5.7 % (ref 0.0–7.0)
HCT: 36.1 % — ABNORMAL LOW (ref 38.4–49.9)
HEMOGLOBIN: 12 g/dL — AB (ref 13.0–17.1)
LYMPH%: 31 % (ref 14.0–49.0)
MCH: 36.4 pg — AB (ref 27.2–33.4)
MCHC: 33.2 g/dL (ref 32.0–36.0)
MCV: 109.4 fL — AB (ref 79.3–98.0)
MONO#: 0.4 10*3/uL (ref 0.1–0.9)
MONO%: 8.8 % (ref 0.0–14.0)
NEUT#: 2.6 10*3/uL (ref 1.5–6.5)
NEUT%: 53.9 % (ref 39.0–75.0)
Platelets: 151 10*3/uL (ref 140–400)
RBC: 3.3 10*6/uL — AB (ref 4.20–5.82)
RDW: 16 % — AB (ref 11.0–14.6)
WBC: 4.9 10*3/uL (ref 4.0–10.3)
lymph#: 1.5 10*3/uL (ref 0.9–3.3)

## 2015-05-22 LAB — LACTATE DEHYDROGENASE (CC13): LDH: 279 U/L — AB (ref 125–245)

## 2015-05-22 MED ORDER — SODIUM CHLORIDE 0.9 % IV SOLN
900.0000 mg | Freq: Once | INTRAVENOUS | Status: AC
  Administered 2015-05-22: 900 mg via INTRAVENOUS
  Filled 2015-05-22: qty 90

## 2015-05-22 MED ORDER — SODIUM CHLORIDE 0.9 % IV SOLN
Freq: Once | INTRAVENOUS | Status: AC
  Administered 2015-05-22: 09:00:00 via INTRAVENOUS

## 2015-05-22 NOTE — Patient Instructions (Signed)
[facility] Discharge Instructions for Patients Receiving Chemotherapy  Today you received the following chemotherapy agents Solaris.   To help prevent nausea and vomiting after your treatment, we encourage you to take your nausea medication as directed.   If you develop nausea and vomiting that is not controlled by your nausea medication, call the clinic.   BELOW ARE SYMPTOMS THAT SHOULD BE REPORTED IMMEDIATELY:  *FEVER GREATER THAN 100.5 F  *CHILLS WITH OR WITHOUT FEVER  NAUSEA AND VOMITING THAT IS NOT CONTROLLED WITH YOUR NAUSEA MEDICATION  *UNUSUAL SHORTNESS OF BREATH  *UNUSUAL BRUISING OR BLEEDING  TENDERNESS IN MOUTH AND THROAT WITH OR WITHOUT PRESENCE OF ULCERS  *URINARY PROBLEMS  *BOWEL PROBLEMS  UNUSUAL RASH Items with * indicate a potential emergency and should be followed up as soon as possible.  Feel free to call the clinic you have any questions or concerns. The clinic phone number is [phone].  Please show the CHEMO ALERT CARD at check-in to the Emergency Department and triage nurse.   

## 2015-06-05 ENCOUNTER — Telehealth: Payer: Self-pay | Admitting: *Deleted

## 2015-06-05 ENCOUNTER — Ambulatory Visit (HOSPITAL_BASED_OUTPATIENT_CLINIC_OR_DEPARTMENT_OTHER): Payer: Commercial Managed Care - HMO | Admitting: Physician Assistant

## 2015-06-05 ENCOUNTER — Ambulatory Visit (HOSPITAL_BASED_OUTPATIENT_CLINIC_OR_DEPARTMENT_OTHER): Payer: Commercial Managed Care - HMO

## 2015-06-05 ENCOUNTER — Telehealth: Payer: Self-pay | Admitting: Internal Medicine

## 2015-06-05 ENCOUNTER — Other Ambulatory Visit (HOSPITAL_BASED_OUTPATIENT_CLINIC_OR_DEPARTMENT_OTHER): Payer: Commercial Managed Care - HMO

## 2015-06-05 VITALS — BP 145/69 | HR 73 | Temp 98.1°F | Resp 18 | Ht 66.0 in | Wt 171.0 lb

## 2015-06-05 DIAGNOSIS — D595 Paroxysmal nocturnal hemoglobinuria [Marchiafava-Micheli]: Secondary | ICD-10-CM

## 2015-06-05 DIAGNOSIS — Z5112 Encounter for antineoplastic immunotherapy: Secondary | ICD-10-CM | POA: Diagnosis not present

## 2015-06-05 LAB — COMPREHENSIVE METABOLIC PANEL
ALBUMIN: 4.2 g/dL (ref 3.5–5.0)
ALK PHOS: 76 U/L (ref 40–150)
ALT: 23 U/L (ref 0–55)
AST: 28 U/L (ref 5–34)
Anion Gap: 12 mEq/L — ABNORMAL HIGH (ref 3–11)
BUN: 12.9 mg/dL (ref 7.0–26.0)
CALCIUM: 9.3 mg/dL (ref 8.4–10.4)
CHLORIDE: 106 meq/L (ref 98–109)
CO2: 21 mEq/L — ABNORMAL LOW (ref 22–29)
Creatinine: 0.9 mg/dL (ref 0.7–1.3)
GLUCOSE: 98 mg/dL (ref 70–140)
POTASSIUM: 4.1 meq/L (ref 3.5–5.1)
SODIUM: 139 meq/L (ref 136–145)
Total Bilirubin: 2.77 mg/dL — ABNORMAL HIGH (ref 0.20–1.20)
Total Protein: 6.9 g/dL (ref 6.4–8.3)

## 2015-06-05 LAB — CBC WITH DIFFERENTIAL/PLATELET
BASO%: 0.4 % (ref 0.0–2.0)
BASOS ABS: 0 10*3/uL (ref 0.0–0.1)
EOS%: 5.8 % (ref 0.0–7.0)
Eosinophils Absolute: 0.3 10*3/uL (ref 0.0–0.5)
HCT: 36.4 % — ABNORMAL LOW (ref 38.4–49.9)
HEMOGLOBIN: 12.2 g/dL — AB (ref 13.0–17.1)
LYMPH#: 1.2 10*3/uL (ref 0.9–3.3)
LYMPH%: 22.4 % (ref 14.0–49.0)
MCH: 37.4 pg — AB (ref 27.2–33.4)
MCHC: 33.5 g/dL (ref 32.0–36.0)
MCV: 111.7 fL — AB (ref 79.3–98.0)
MONO#: 0.5 10*3/uL (ref 0.1–0.9)
MONO%: 8.7 % (ref 0.0–14.0)
NEUT#: 3.3 10*3/uL (ref 1.5–6.5)
NEUT%: 62.7 % (ref 39.0–75.0)
Platelets: 126 10*3/uL — ABNORMAL LOW (ref 140–400)
RBC: 3.26 10*6/uL — ABNORMAL LOW (ref 4.20–5.82)
RDW: 16 % — ABNORMAL HIGH (ref 11.0–14.6)
WBC: 5.3 10*3/uL (ref 4.0–10.3)

## 2015-06-05 LAB — LACTATE DEHYDROGENASE: LDH: 245 U/L (ref 125–245)

## 2015-06-05 MED ORDER — SODIUM CHLORIDE 0.9 % IV SOLN
Freq: Once | INTRAVENOUS | Status: AC
  Administered 2015-06-05: 10:00:00 via INTRAVENOUS

## 2015-06-05 MED ORDER — SODIUM CHLORIDE 0.9 % IV SOLN
900.0000 mg | Freq: Once | INTRAVENOUS | Status: AC
  Administered 2015-06-05: 900 mg via INTRAVENOUS
  Filled 2015-06-05: qty 90

## 2015-06-05 NOTE — Telephone Encounter (Signed)
Per staff message and POF I have scheduled appts. Advised scheduler of appts. JMW  

## 2015-06-05 NOTE — Progress Notes (Signed)
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   PRIOR THERAPY:  1) Tapered dose of prednisone. 2) High dose of IVIG   CURRENT THERAPY: Soliris (Eculizumab) induction with 600 mg IV weekly 4 weeks followed by maintenance treatment at a dose of 900 mg IV every 2 weeks. First dose of treatment is expected on 11/21/2014. Status post 7cycles. He is here today to start day 1 of cycle #8.  INTERVAL HISTORY: Travis Mcdaniel 44 y.o. Mcdaniel returns to the clinic today for follow-up visit. He is tolerating his treatment with Soliris fairly well with no significant adverse effects. He denied having any significant fever or chills. He has no nausea or vomiting. He denied having any significant chest pain, shortness of breath, cough or hemoptysis. He has no significant weight loss or night sweats. He continues to drink alcohol a few times a week.  He is here today to start cycle #8 of his treatment with Soliris.  MEDICAL HISTORY: Past Medical History  Diagnosis Date  . Diverticulosis     ALLERGIES:  has No Known Allergies.  MEDICATIONS:  Current Outpatient Prescriptions  Medication Sig Dispense Refill  . ALPRAZolam (XANAX) 0.5 MG tablet 3 (three) times daily as needed.     . Multiple Vitamin (MULTIVITAMIN WITH MINERALS) TABS tablet Take 1 tablet by mouth daily.    . Psyllium (METAMUCIL PO) Take 1 capsule by mouth daily.     No current facility-administered medications for this visit.    SURGICAL HISTORY: No past surgical history on file.  REVIEW OF SYSTEMS:  Constitutional: negative Eyes: negative Ears, nose, mouth, throat, and face: negative Respiratory: negative Cardiovascular: negative Gastrointestinal:  negative Genitourinary:negative Integument/breast: negative Hematologic/lymphatic: negative Musculoskeletal:positive for back pain Neurological: negative Behavioral/Psych: positive for anxiety Endocrine: negative Allergic/Immunologic: negative   PHYSICAL EXAMINATION: General appearance: alert, cooperative and no distress Head: Normocephalic, without obvious abnormality, atraumatic Neck: no adenopathy, no JVD, supple, symmetrical, trachea midline and thyroid not enlarged, symmetric, no tenderness/mass/nodules Lymph nodes: Cervical, supraclavicular, and axillary nodes normal. Resp: clear to auscultation bilaterally Back: symmetric, no curvature. ROM normal. No CVA tenderness. Cardio: regular rate and rhythm, S1, S2 normal, no murmur, click, rub or gallop GI: soft, non-tender; bowel sounds normal; no masses,  no organomegaly Extremities: extremities normal, atraumatic, no cyanosis or edema Neurologic: Alert and oriented X 3, normal strength and tone. Normal symmetric reflexes. Normal coordination and gait  ECOG PERFORMANCE STATUS: 1 - Symptomatic but completely ambulatory  Blood pressure 145/69, pulse 73, temperature 98.1 F (36.7 C), temperature source Oral, resp. rate 18, height 5\' 6"  (1.676 m), weight 171 lb (77.565 kg), SpO2 100 %.  LABORATORY DATA: CBC Latest Ref Rng 06/05/2015 05/22/2015 05/08/2015  WBC 4.0 - 10.3 10e3/uL 5.3 4.9 4.9  Hemoglobin 13.0 - 17.1 g/dL 12.2(L) 12.0(L) 13.0  Hematocrit 38.4 - 49.9 % 36.4(L) 36.1(L) 39.2  Platelets 140 - 400 10e3/uL 126(L) 151 124(L)   CMP Latest Ref Rng 06/05/2015 05/22/2015 05/08/2015  Glucose 70 - 140 mg/dl 98 95 84  BUN 7.0 - 26.0 mg/dL 12.9 11.7 12.5  Creatinine 0.7 - 1.3 mg/dL 0.9 0.8 0.8  Sodium 136 - 145 mEq/L 139 139 139  Potassium 3.5 - 5.1 mEq/L 4.1 3.9 4.1  Chloride 96 - 112 mmol/L - - -  CO2 22 - 29 mEq/L 21(L) 23 23  Calcium 8.4 - 10.4 mg/dL 9.3 9.2 9.5  Total Protein 6.4 - 8.3 g/dL 6.9 6.8 7.0  Total Bilirubin 0.20 -  1.20 mg/dL 2.77(H) 2.52(H) 2.50(H)  Alkaline Phos 40 - 150 U/L 76 81 74  AST 5 - 34 U/L 28 36(H) 24  ALT 0 - 55 U/L 23 29 21      RADIOGRAPHIC STUDIES: No results found.  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 He is currently on treatment with Soliris. He is status post 7 cycles of treatment.  The patient is tolerating his treatment fairly well. I recommended for him to continue his treatment as a scheduled.  He will start cycle #8 today.He would come back for follow-up visit in 4 weeks for reevaluation before starting cycle #9. For hypertension, he will be scheduled to see Dr. [name] as scheduled The patient was advised to call immediately if he has any concerning symptoms in the interval. The patient voices understanding of current disease status and treatment options and is in agreement with the current care plan.  All questions were answered. The patient knows to call the clinic with any problems, questions or concerns. We can certainly see the patient much sooner if necessary.   ADDENDUM: Hematology/Oncology Attending: I had a face to face encounter with the patient. I recommended his care plan. This is a very pleasant Travis years old white Mcdaniel with paroxysmal nocturnal hemoglobinuria currently on treatment with several areas status post 7 cycles. Has been tolerating his treatment fairly well with no significant adverse effects. I recommended for the patient to proceed with cycle #8 today as scheduled. 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 I strongly encouraged the patient to quit alcohol drinking. He will proceed with his treatment today as scheduled and the patient would come back for follow-up visit in 4 weeks for reevaluation before starting cycle  #9. He was advised to call immediately if he has any concerning symptoms in the interval.  Disclaimer: This note was dictated with voice recognition software. Similar sounding words can inadvertently be transcribed and may be missed upon review. Eilleen Kempf., MD 06/08/2015

## 2015-06-05 NOTE — Patient Instructions (Signed)
[facility] Discharge Instructions for Patients Receiving Chemotherapy  Today you received the following chemotherapy agents Solaris.   To help prevent nausea and vomiting after your treatment, we encourage you to take your nausea medication as directed.   If you develop nausea and vomiting that is not controlled by your nausea medication, call the clinic.   BELOW ARE SYMPTOMS THAT SHOULD BE REPORTED IMMEDIATELY:  *FEVER GREATER THAN 100.5 F  *CHILLS WITH OR WITHOUT FEVER  NAUSEA AND VOMITING THAT IS NOT CONTROLLED WITH YOUR NAUSEA MEDICATION  *UNUSUAL SHORTNESS OF BREATH  *UNUSUAL BRUISING OR BLEEDING  TENDERNESS IN MOUTH AND THROAT WITH OR WITHOUT PRESENCE OF ULCERS  *URINARY PROBLEMS  *BOWEL PROBLEMS  UNUSUAL RASH Items with * indicate a potential emergency and should be followed up as soon as possible.  Feel free to call the clinic you have any questions or concerns. The clinic phone number is [phone].  Please show the CHEMO ALERT CARD at check-in to the Emergency Department and triage nurse.   

## 2015-06-05 NOTE — Telephone Encounter (Signed)
per pof to sch pt appt-sent MW email to sch pt trmt-pt to get updated copy b4 leaving °

## 2015-06-19 ENCOUNTER — Ambulatory Visit (HOSPITAL_BASED_OUTPATIENT_CLINIC_OR_DEPARTMENT_OTHER): Payer: Commercial Managed Care - HMO

## 2015-06-19 ENCOUNTER — Other Ambulatory Visit (HOSPITAL_BASED_OUTPATIENT_CLINIC_OR_DEPARTMENT_OTHER): Payer: Commercial Managed Care - HMO

## 2015-06-19 VITALS — BP 142/81 | HR 79 | Temp 98.0°F | Resp 18

## 2015-06-19 DIAGNOSIS — Z5112 Encounter for antineoplastic immunotherapy: Secondary | ICD-10-CM

## 2015-06-19 DIAGNOSIS — D595 Paroxysmal nocturnal hemoglobinuria [Marchiafava-Micheli]: Secondary | ICD-10-CM | POA: Diagnosis not present

## 2015-06-19 LAB — COMPREHENSIVE METABOLIC PANEL
ALT: 25 U/L (ref 0–55)
ANION GAP: 10 meq/L (ref 3–11)
AST: 27 U/L (ref 5–34)
Albumin: 4.3 g/dL (ref 3.5–5.0)
Alkaline Phosphatase: 84 U/L (ref 40–150)
BILIRUBIN TOTAL: 2.56 mg/dL — AB (ref 0.20–1.20)
BUN: 10.2 mg/dL (ref 7.0–26.0)
CO2: 22 meq/L (ref 22–29)
CREATININE: 0.8 mg/dL (ref 0.7–1.3)
Calcium: 9.2 mg/dL (ref 8.4–10.4)
Chloride: 106 mEq/L (ref 98–109)
EGFR: >90 mL/min/{1.73_m2} (ref >90)
GLUCOSE: 93 mg/dL (ref 70–140)
Potassium: 4 mEq/L (ref 3.5–5.1)
SODIUM: 138 meq/L (ref 136–145)
TOTAL PROTEIN: 7.2 g/dL (ref 6.4–8.3)

## 2015-06-19 LAB — LACTATE DEHYDROGENASE: LDH: 270 U/L — ABNORMAL HIGH (ref 125–245)

## 2015-06-19 LAB — CBC WITH DIFFERENTIAL/PLATELET
BASO%: 0.4 % (ref 0.0–2.0)
Basophils Absolute: 0 10*3/uL (ref 0.0–0.1)
EOS%: 5.9 % (ref 0.0–7.0)
Eosinophils Absolute: 0.3 10*3/uL (ref 0.0–0.5)
HEMATOCRIT: 36.3 % — AB (ref 38.4–49.9)
HGB: 11.7 g/dL — ABNORMAL LOW (ref 13.0–17.1)
LYMPH#: 1.3 10*3/uL (ref 0.9–3.3)
LYMPH%: 24.7 % (ref 14.0–49.0)
MCH: 35.8 pg — AB (ref 27.2–33.4)
MCHC: 32.2 g/dL (ref 32.0–36.0)
MCV: 111 fL — ABNORMAL HIGH (ref 79.3–98.0)
MONO#: 0.4 10*3/uL (ref 0.1–0.9)
MONO%: 7.7 % (ref 0.0–14.0)
NEUT#: 3.1 10*3/uL (ref 1.5–6.5)
NEUT%: 61.3 % (ref 39.0–75.0)
Platelets: 144 10*3/uL (ref 140–400)
RBC: 3.27 10*6/uL — ABNORMAL LOW (ref 4.20–5.82)
RDW: 15.6 % — AB (ref 11.0–14.6)
WBC: 5.1 10*3/uL (ref 4.0–10.3)

## 2015-06-19 MED ORDER — SODIUM CHLORIDE 0.9 % IV SOLN
900.0000 mg | Freq: Once | INTRAVENOUS | Status: AC
  Administered 2015-06-19: 900 mg via INTRAVENOUS
  Filled 2015-06-19: qty 90

## 2015-06-19 MED ORDER — SODIUM CHLORIDE 0.9 % IV SOLN
Freq: Once | INTRAVENOUS | Status: AC
  Administered 2015-06-19: 09:00:00 via INTRAVENOUS

## 2015-06-19 NOTE — Patient Instructions (Signed)
Eculizumab injection (Soliris) What is this medicine? ECULIZUMAB (ek yoo LYE zyoo mab) is a monoclonal antibody. It is used to treat a rare kind of anemia called paroxysmal nocturnal hemoglobinuria or PNH. It may help prevent the loss of blood in patients with PNH. This medicine may be used for other purposes; ask your health care provider or pharmacist if you have questions. What should I tell my health care provider before I take this medicine? They need to know if you have any of these conditions: -infection -not received meningococcal vaccine -an unusual or allergic reaction to this eculizumab, other medicines, foods, dyes, or preservatives -pregnant or trying to get pregnant -breast-feeding How should I use this medicine? This medicine is for infusion into a vein. It is given by a health care professional in a hospital or clinic setting. A special MedGuide will be given to you by the pharmacist with each prescription and refill. Be sure to read this information carefully each time. Talk to your pediatrician regarding the use of this medicine in children. Special care may be needed. Overdosage: If you think you have taken too much of this medicine contact a poison control center or emergency room at once. NOTE: This medicine is only for you. Do not share this medicine with others. What if I miss a dose? It is important not to miss your dose. Call your doctor or health care professional if you are unable to keep an appointment. What may interact with this medicine? Interactions are not expected. This list may not describe all possible interactions. Give your health care provider a list of all the medicines, herbs, non-prescription drugs, or dietary supplements you use. Also tell them if you smoke, drink alcohol, or use illegal drugs. Some items may interact with your medicine. What should I watch for while using this medicine? Your condition will be monitored carefully while you are  receiving this medicine. You will need regular blood work. Tell your doctor or healthcare professional if your symptoms do not start to get better or if they get worse. You may have sudden breakdown of your red blood cells after you stop taking this medicine. You will need to be followed by your doctor for 8 weeks or more after this therapy is complete. This medicine may decrease your body's ability to fight infection. Avoid being around people who are sick. Carry the Patient Safety Card given to you at all times. Seek medical help if you have any of the symptoms listed on the card. What side effects may I notice from receiving this medicine? Side effects that you should report to your doctor or health care professional as soon as possible: -allergic reactions like skin rash, itching or hives, swelling of the face, lips, or tongue -back pain -breathing problems -bruising, pinpoint red spots on the skin -confusion -eyes sensitive to light -fast, irregular heartbeat -fever, chills, or any other sign of infection -pain, swelling, warmth in the leg -pain, tingling, numbness in the hands or feet -problems with balance, talking, walking -seizures -stiff neck -unusually weak or tired -vomiting Side effects that usually do not require medical attention (report to your doctor or health care professional if they continue or are bothersome): -aches and pains -constipation -headache -nausea -runny nose or colds -sore throat This list may not describe all possible side effects. Call your doctor for medical advice about side effects. You may report side effects to FDA at 1-800-FDA-1088. Where should I keep my medicine? This drug is given in a  hospital or clinic and will not be stored at home. NOTE: This sheet is a summary. It may not cover all possible information. If you have questions about this medicine, talk to your doctor, pharmacist, or health care provider.    2016, Elsevier/Gold Standard.  (2008-06-12 17:23:14)

## 2015-06-19 NOTE — Progress Notes (Signed)
Pt refused to stay for full hour post infusion observation period but did stay for 30 minutes.  No complaints or concerns at time of discharge.  Pt verbalized understanding to call with any questions or concerns.

## 2015-07-03 ENCOUNTER — Ambulatory Visit (HOSPITAL_BASED_OUTPATIENT_CLINIC_OR_DEPARTMENT_OTHER): Payer: Commercial Managed Care - HMO

## 2015-07-03 ENCOUNTER — Encounter: Payer: Self-pay | Admitting: Internal Medicine

## 2015-07-03 ENCOUNTER — Telehealth: Payer: Self-pay | Admitting: Internal Medicine

## 2015-07-03 ENCOUNTER — Other Ambulatory Visit (HOSPITAL_BASED_OUTPATIENT_CLINIC_OR_DEPARTMENT_OTHER): Payer: Commercial Managed Care - HMO

## 2015-07-03 ENCOUNTER — Ambulatory Visit (HOSPITAL_BASED_OUTPATIENT_CLINIC_OR_DEPARTMENT_OTHER): Payer: Commercial Managed Care - HMO | Admitting: Internal Medicine

## 2015-07-03 VITALS — BP 150/77 | HR 65 | Temp 97.7°F | Resp 18 | Ht 66.0 in | Wt 167.7 lb

## 2015-07-03 DIAGNOSIS — Z5112 Encounter for antineoplastic immunotherapy: Secondary | ICD-10-CM | POA: Diagnosis not present

## 2015-07-03 DIAGNOSIS — I10 Essential (primary) hypertension: Secondary | ICD-10-CM | POA: Diagnosis not present

## 2015-07-03 DIAGNOSIS — D595 Paroxysmal nocturnal hemoglobinuria [Marchiafava-Micheli]: Secondary | ICD-10-CM | POA: Diagnosis not present

## 2015-07-03 LAB — COMPREHENSIVE METABOLIC PANEL
ALT: 22 U/L (ref 0–55)
AST: 24 U/L (ref 5–34)
Albumin: 4.4 g/dL (ref 3.5–5.0)
Alkaline Phosphatase: 78 U/L (ref 40–150)
Anion Gap: 10 mEq/L (ref 3–11)
BUN: 9.7 mg/dL (ref 7.0–26.0)
CHLORIDE: 106 meq/L (ref 98–109)
CO2: 24 mEq/L (ref 22–29)
Calcium: 9.1 mg/dL (ref 8.4–10.4)
Creatinine: 0.8 mg/dL (ref 0.7–1.3)
EGFR: >90 mL/min/{1.73_m2} (ref >90)
GLUCOSE: 87 mg/dL (ref 70–140)
POTASSIUM: 4.2 meq/L (ref 3.5–5.1)
SODIUM: 139 meq/L (ref 136–145)
Total Bilirubin: 2.2 mg/dL — ABNORMAL HIGH (ref 0.20–1.20)
Total Protein: 7.4 g/dL (ref 6.4–8.3)

## 2015-07-03 LAB — CBC WITH DIFFERENTIAL/PLATELET
BASO%: 0.6 % (ref 0.0–2.0)
BASOS ABS: 0 10*3/uL (ref 0.0–0.1)
EOS%: 5.3 % (ref 0.0–7.0)
Eosinophils Absolute: 0.3 10*3/uL (ref 0.0–0.5)
HCT: 39.7 % (ref 38.4–49.9)
HGB: 12.9 g/dL — ABNORMAL LOW (ref 13.0–17.1)
LYMPH%: 24.3 % (ref 14.0–49.0)
MCH: 36.5 pg — AB (ref 27.2–33.4)
MCHC: 32.5 g/dL (ref 32.0–36.0)
MCV: 112.5 fL — ABNORMAL HIGH (ref 79.3–98.0)
MONO#: 0.4 10*3/uL (ref 0.1–0.9)
MONO%: 7.9 % (ref 0.0–14.0)
NEUT#: 3.1 10*3/uL (ref 1.5–6.5)
NEUT%: 61.9 % (ref 39.0–75.0)
Platelets: 140 10*3/uL (ref 140–400)
RBC: 3.53 10*6/uL — ABNORMAL LOW (ref 4.20–5.82)
RDW: 15.5 % — AB (ref 11.0–14.6)
WBC: 4.9 10*3/uL (ref 4.0–10.3)
lymph#: 1.2 10*3/uL (ref 0.9–3.3)

## 2015-07-03 LAB — LACTATE DEHYDROGENASE: LDH: 226 U/L (ref 125–245)

## 2015-07-03 MED ORDER — SODIUM CHLORIDE 0.9 % IV SOLN
Freq: Once | INTRAVENOUS | Status: AC
  Administered 2015-07-03: 11:00:00 via INTRAVENOUS

## 2015-07-03 MED ORDER — SODIUM CHLORIDE 0.9 % IV SOLN
900.0000 mg | Freq: Once | INTRAVENOUS | Status: AC
  Administered 2015-07-03: 900 mg via INTRAVENOUS
  Filled 2015-07-03: qty 90

## 2015-07-03 NOTE — Progress Notes (Signed)
Patient refused to stay for complete 1 hour post-soliris observation. Nearing one hour, requesting to be disconnected early. Discharged to lobby ambulatory and in no apparent distress.

## 2015-07-03 NOTE — Patient Instructions (Signed)
Eculizumab injection (Soliris) What is this medicine? ECULIZUMAB (ek yoo LYE zyoo mab) is a monoclonal antibody. It is used to treat a rare kind of anemia called paroxysmal nocturnal hemoglobinuria or PNH. It may help prevent the loss of blood in patients with PNH. This medicine may be used for other purposes; ask your health care provider or pharmacist if you have questions. What should I tell my health care provider before I take this medicine? They need to know if you have any of these conditions: -infection -not received meningococcal vaccine -an unusual or allergic reaction to this eculizumab, other medicines, foods, dyes, or preservatives -pregnant or trying to get pregnant -breast-feeding How should I use this medicine? This medicine is for infusion into a vein. It is given by a health care professional in a hospital or clinic setting. A special MedGuide will be given to you by the pharmacist with each prescription and refill. Be sure to read this information carefully each time. Talk to your pediatrician regarding the use of this medicine in children. Special care may be needed. Overdosage: If you think you have taken too much of this medicine contact a poison control center or emergency room at once. NOTE: This medicine is only for you. Do not share this medicine with others. What if I miss a dose? It is important not to miss your dose. Call your doctor or health care professional if you are unable to keep an appointment. What may interact with this medicine? Interactions are not expected. This list may not describe all possible interactions. Give your health care provider a list of all the medicines, herbs, non-prescription drugs, or dietary supplements you use. Also tell them if you smoke, drink alcohol, or use illegal drugs. Some items may interact with your medicine. What should I watch for while using this medicine? Your condition will be monitored carefully while you are  receiving this medicine. You will need regular blood work. Tell your doctor or healthcare professional if your symptoms do not start to get better or if they get worse. You may have sudden breakdown of your red blood cells after you stop taking this medicine. You will need to be followed by your doctor for 8 weeks or more after this therapy is complete. This medicine may decrease your body's ability to fight infection. Avoid being around people who are sick. Carry the Patient Safety Card given to you at all times. Seek medical help if you have any of the symptoms listed on the card. What side effects may I notice from receiving this medicine? Side effects that you should report to your doctor or health care professional as soon as possible: -allergic reactions like skin rash, itching or hives, swelling of the face, lips, or tongue -back pain -breathing problems -bruising, pinpoint red spots on the skin -confusion -eyes sensitive to light -fast, irregular heartbeat -fever, chills, or any other sign of infection -pain, swelling, warmth in the leg -pain, tingling, numbness in the hands or feet -problems with balance, talking, walking -seizures -stiff neck -unusually weak or tired -vomiting Side effects that usually do not require medical attention (report to your doctor or health care professional if they continue or are bothersome): -aches and pains -constipation -headache -nausea -runny nose or colds -sore throat This list may not describe all possible side effects. Call your doctor for medical advice about side effects. You may report side effects to FDA at 1-800-FDA-1088. Where should I keep my medicine? This drug is given in a  hospital or clinic and will not be stored at home. NOTE: This sheet is a summary. It may not cover all possible information. If you have questions about this medicine, talk to your doctor, pharmacist, or health care provider.    2016, Elsevier/Gold Standard.  (2008-06-12 17:23:14)

## 2015-07-03 NOTE — Telephone Encounter (Signed)
Gv pt appts for Jan + Feb 2017. °

## 2015-07-03 NOTE — Progress Notes (Signed)
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   PRIOR THERAPY:  1) Tapered dose of prednisone. 2) High dose of IVIG   CURRENT THERAPY: Soliris (Eculizumab) induction with 600 mg IV weekly 4 weeks followed by maintenance treatment at a dose of 900 mg IV every 2 weeks. First dose of treatment is expected on 11/21/2014. Status post 8 cycles. He is here today to start day 1 of cycle #9.  INTERVAL HISTORY: Travis Mcdaniel 45 y.o. male returns to the clinic today for follow-up visit. He is tolerating his treatment with Soliris fairly well with no significant adverse effects. He continues to drink alcohol at regular basis. I strongly advise him to quit or decrease the amount of alcohol drinking. He denied having any significant fever or chills. He has no nausea or vomiting. He denied having any significant chest pain, shortness of breath, cough or hemoptysis. He has no significant weight loss or night sweats. He continues to drink alcohol a few times a week.  He is here today to start cycle #9 of his treatment with Soliris.  MEDICAL HISTORY: Past Medical History  Diagnosis Date  . Diverticulosis     ALLERGIES:  has No Known Allergies.  MEDICATIONS:  Current Outpatient Prescriptions  Medication Sig Dispense Refill  . ALPRAZolam (XANAX) 0.5 MG tablet 3 (three) times daily as needed.     . Multiple Vitamin (MULTIVITAMIN WITH MINERALS) TABS tablet Take 1 tablet by mouth daily.    . Psyllium (METAMUCIL PO) Take 1 capsule by mouth daily.     No current facility-administered medications for this visit.    SURGICAL HISTORY: History reviewed. No pertinent past surgical history.  REVIEW OF SYSTEMS:  Constitutional: negative Eyes:  negative Ears, nose, mouth, throat, and face: negative Respiratory: negative Cardiovascular: negative Gastrointestinal: negative Genitourinary:negative Integument/breast: negative Hematologic/lymphatic: negative Musculoskeletal:positive for back pain Neurological: negative Behavioral/Psych: positive for anxiety Endocrine: negative Allergic/Immunologic: negative   PHYSICAL EXAMINATION: General appearance: alert, cooperative and no distress Head: Normocephalic, without obvious abnormality, atraumatic Neck: no adenopathy, no JVD, supple, symmetrical, trachea midline and thyroid not enlarged, symmetric, no tenderness/mass/nodules Lymph nodes: Cervical, supraclavicular, and axillary nodes normal. Resp: clear to auscultation bilaterally Back: symmetric, no curvature. ROM normal. No CVA tenderness. Cardio: regular rate and rhythm, S1, S2 normal, no murmur, click, rub or gallop GI: soft, non-tender; bowel sounds normal; no masses,  no organomegaly Extremities: extremities normal, atraumatic, no cyanosis or edema Neurologic: Alert and oriented X 3, normal strength and tone. Normal symmetric reflexes. Normal coordination and gait  ECOG PERFORMANCE STATUS: 1 - Symptomatic but completely ambulatory  Blood pressure 150/77, pulse 65, temperature 97.7 F (36.5 C), temperature source Oral, resp. rate 18, height 5\' 6"  (1.676 m), weight 167 lb 11.2 oz (76.068 kg), SpO2 100 %.  LABORATORY DATA: Lab Results  Component Value Date   WBC 4.9 07/03/2015   HGB 12.9* 07/03/2015   HCT 39.7 07/03/2015   MCV 112.5* 07/03/2015   PLT 140 07/03/2015      Chemistry      Component Value Date/Time   NA 139 07/03/2015 0934   NA 140 09/07/2014 0407   K 4.2 07/03/2015 0934   K 3.9 09/07/2014 0407   CL 110 09/07/2014 0407   CO2 24 07/03/2015  0934   CO2 22 09/07/2014 0407   BUN 9.7 07/03/2015 0934   BUN 12 09/07/2014 0407   CREATININE 0.8 07/03/2015 0934   CREATININE 0.99 09/07/2014 0407        Component Value Date/Time   CALCIUM 9.1 07/03/2015 0934   CALCIUM 8.3* 09/07/2014 0407   ALKPHOS 78 07/03/2015 0934   ALKPHOS 30* 09/07/2014 0407   AST 24 07/03/2015 0934   AST 120* 09/07/2014 0407   ALT 22 07/03/2015 0934   ALT 27 09/07/2014 0407   BILITOT 2.20* 07/03/2015 0934   BILITOT 0.7 09/07/2014 0407       RADIOGRAPHIC STUDIES: No results found.  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 He is currently on treatment with Soliris. He is status post 8 cycles of treatment.  The patient is tolerating his treatment fairly well. I recommended for him to continue his treatment as a scheduled.  He will start cycle #9 today.He would come back for follow-up visit in 4 weeks for reevaluation before starting cycle #10. The patient was also advised to take daily aspirin during his treatment with Soliris. For hypertension, he is currently managed by Dr. [name]. The patient was advised to call immediately if he has any concerning symptoms in the interval. The patient voices understanding of current disease status and treatment options and is in agreement with the current care plan.  All questions were answered. The patient knows to call the clinic with any problems, questions or concerns. We can certainly see the patient much sooner if necessary.  Disclaimer: This note was dictated with voice recognition software. Similar sounding words can inadvertently be transcribed and may not be corrected upon review.

## 2015-07-17 ENCOUNTER — Other Ambulatory Visit (HOSPITAL_BASED_OUTPATIENT_CLINIC_OR_DEPARTMENT_OTHER): Payer: Commercial Managed Care - HMO

## 2015-07-17 ENCOUNTER — Ambulatory Visit (HOSPITAL_BASED_OUTPATIENT_CLINIC_OR_DEPARTMENT_OTHER): Payer: Commercial Managed Care - HMO

## 2015-07-17 VITALS — BP 145/72 | HR 77 | Temp 97.7°F | Resp 18

## 2015-07-17 DIAGNOSIS — D595 Paroxysmal nocturnal hemoglobinuria [Marchiafava-Micheli]: Secondary | ICD-10-CM | POA: Diagnosis not present

## 2015-07-17 DIAGNOSIS — Z5112 Encounter for antineoplastic immunotherapy: Secondary | ICD-10-CM | POA: Diagnosis not present

## 2015-07-17 LAB — COMPREHENSIVE METABOLIC PANEL
ALT: 29 U/L (ref 0–55)
ANION GAP: 10 meq/L (ref 3–11)
AST: 27 U/L (ref 5–34)
Albumin: 4.4 g/dL (ref 3.5–5.0)
Alkaline Phosphatase: 72 U/L (ref 40–150)
BUN: 15 mg/dL (ref 7.0–26.0)
CHLORIDE: 106 meq/L (ref 98–109)
CO2: 23 meq/L (ref 22–29)
CREATININE: 0.8 mg/dL (ref 0.7–1.3)
Calcium: 9 mg/dL (ref 8.4–10.4)
EGFR: >90 mL/min/{1.73_m2} (ref >90)
Glucose: 88 mg/dl (ref 70–140)
POTASSIUM: 4.1 meq/L (ref 3.5–5.1)
Sodium: 139 mEq/L (ref 136–145)
Total Bilirubin: 2.23 mg/dL — ABNORMAL HIGH (ref 0.20–1.20)
Total Protein: 7.3 g/dL (ref 6.4–8.3)

## 2015-07-17 LAB — CBC WITH DIFFERENTIAL/PLATELET
BASO%: 0.6 % (ref 0.0–2.0)
BASOS ABS: 0 10*3/uL (ref 0.0–0.1)
EOS%: 6.5 % (ref 0.0–7.0)
Eosinophils Absolute: 0.3 10*3/uL (ref 0.0–0.5)
HCT: 37.9 % — ABNORMAL LOW (ref 38.4–49.9)
HGB: 12.8 g/dL — ABNORMAL LOW (ref 13.0–17.1)
LYMPH%: 29.6 % (ref 14.0–49.0)
MCH: 36.6 pg — AB (ref 27.2–33.4)
MCHC: 33.8 g/dL (ref 32.0–36.0)
MCV: 108.3 fL — ABNORMAL HIGH (ref 79.3–98.0)
MONO#: 0.5 10*3/uL (ref 0.1–0.9)
MONO%: 10.2 % (ref 0.0–14.0)
NEUT#: 2.7 10*3/uL (ref 1.5–6.5)
NEUT%: 53.1 % (ref 39.0–75.0)
PLATELETS: 131 10*3/uL — AB (ref 140–400)
RBC: 3.5 10*6/uL — AB (ref 4.20–5.82)
RDW: 14.2 % (ref 11.0–14.6)
WBC: 5.1 10*3/uL (ref 4.0–10.3)
lymph#: 1.5 10*3/uL (ref 0.9–3.3)

## 2015-07-17 LAB — LACTATE DEHYDROGENASE: LDH: 235 U/L (ref 125–245)

## 2015-07-17 MED ORDER — SODIUM CHLORIDE 0.9 % IV SOLN
Freq: Once | INTRAVENOUS | Status: AC
  Administered 2015-07-17: 09:00:00 via INTRAVENOUS

## 2015-07-17 MED ORDER — SODIUM CHLORIDE 0.9 % IV SOLN
900.0000 mg | Freq: Once | INTRAVENOUS | Status: AC
  Administered 2015-07-17: 900 mg via INTRAVENOUS
  Filled 2015-07-17: qty 90

## 2015-07-17 NOTE — Patient Instructions (Signed)
[facility] Discharge Instructions for Patients Receiving Chemotherapy  Today you received the following chemotherapy agents:  Soliris.  To help prevent nausea and vomiting after your treatment, we encourage you to take your nausea medication as directed.   If you develop nausea and vomiting that is not controlled by your nausea medication, call the clinic.   BELOW ARE SYMPTOMS THAT SHOULD BE REPORTED IMMEDIATELY:  *FEVER GREATER THAN 100.5 F  *CHILLS WITH OR WITHOUT FEVER  NAUSEA AND VOMITING THAT IS NOT CONTROLLED WITH YOUR NAUSEA MEDICATION  *UNUSUAL SHORTNESS OF BREATH  *UNUSUAL BRUISING OR BLEEDING  TENDERNESS IN MOUTH AND THROAT WITH OR WITHOUT PRESENCE OF ULCERS  *URINARY PROBLEMS  *BOWEL PROBLEMS  UNUSUAL RASH Items with * indicate a potential emergency and should be followed up as soon as possible.  Feel free to call the clinic you have any questions or concerns. The clinic phone number is [phone].  Please show the CHEMO ALERT CARD at check-in to the Emergency Department and triage nurse.   

## 2015-07-17 NOTE — Progress Notes (Signed)
Pt refusing to stay for 1 hour observation period.  Pt remained with [place] for 30 minutes post infusion and discharged without complaints or concerns.

## 2015-07-31 ENCOUNTER — Other Ambulatory Visit (HOSPITAL_BASED_OUTPATIENT_CLINIC_OR_DEPARTMENT_OTHER): Payer: Commercial Managed Care - HMO

## 2015-07-31 ENCOUNTER — Ambulatory Visit (HOSPITAL_BASED_OUTPATIENT_CLINIC_OR_DEPARTMENT_OTHER): Payer: Commercial Managed Care - HMO

## 2015-07-31 ENCOUNTER — Encounter: Payer: Self-pay | Admitting: Internal Medicine

## 2015-07-31 ENCOUNTER — Telehealth: Payer: Self-pay | Admitting: Internal Medicine

## 2015-07-31 ENCOUNTER — Telehealth: Payer: Self-pay | Admitting: *Deleted

## 2015-07-31 ENCOUNTER — Ambulatory Visit (HOSPITAL_BASED_OUTPATIENT_CLINIC_OR_DEPARTMENT_OTHER): Payer: Commercial Managed Care - HMO | Admitting: Internal Medicine

## 2015-07-31 VITALS — BP 140/67 | HR 81 | Temp 98.4°F | Resp 18 | Wt 167.5 lb

## 2015-07-31 DIAGNOSIS — D595 Paroxysmal nocturnal hemoglobinuria [Marchiafava-Micheli]: Secondary | ICD-10-CM

## 2015-07-31 DIAGNOSIS — I10 Essential (primary) hypertension: Secondary | ICD-10-CM

## 2015-07-31 DIAGNOSIS — Z5112 Encounter for antineoplastic immunotherapy: Secondary | ICD-10-CM

## 2015-07-31 LAB — CBC WITH DIFFERENTIAL/PLATELET
BASO%: 0.7 % (ref 0.0–2.0)
Basophils Absolute: 0 10*3/uL (ref 0.0–0.1)
EOS%: 3.7 % (ref 0.0–7.0)
Eosinophils Absolute: 0.2 10*3/uL (ref 0.0–0.5)
HEMATOCRIT: 38.7 % (ref 38.4–49.9)
HGB: 12.9 g/dL — ABNORMAL LOW (ref 13.0–17.1)
LYMPH#: 1.2 10*3/uL (ref 0.9–3.3)
LYMPH%: 24.2 % (ref 14.0–49.0)
MCH: 35.6 pg — ABNORMAL HIGH (ref 27.2–33.4)
MCHC: 33.3 g/dL (ref 32.0–36.0)
MCV: 106.7 fL — ABNORMAL HIGH (ref 79.3–98.0)
MONO#: 0.3 10*3/uL (ref 0.1–0.9)
MONO%: 6.9 % (ref 0.0–14.0)
NEUT%: 64.5 % (ref 39.0–75.0)
NEUTROS ABS: 3.1 10*3/uL (ref 1.5–6.5)
PLATELETS: 123 10*3/uL — AB (ref 140–400)
RBC: 3.63 10*6/uL — ABNORMAL LOW (ref 4.20–5.82)
RDW: 14.1 % (ref 11.0–14.6)
WBC: 4.8 10*3/uL (ref 4.0–10.3)

## 2015-07-31 LAB — COMPREHENSIVE METABOLIC PANEL
ALT: 27 U/L (ref 0–55)
ANION GAP: 10 meq/L (ref 3–11)
AST: 26 U/L (ref 5–34)
Albumin: 4.5 g/dL (ref 3.5–5.0)
Alkaline Phosphatase: 69 U/L (ref 40–150)
BILIRUBIN TOTAL: 2.21 mg/dL — AB (ref 0.20–1.20)
BUN: 15.2 mg/dL (ref 7.0–26.0)
CALCIUM: 9.2 mg/dL (ref 8.4–10.4)
CHLORIDE: 106 meq/L (ref 98–109)
CO2: 23 meq/L (ref 22–29)
CREATININE: 0.9 mg/dL (ref 0.7–1.3)
EGFR: >90 mL/min/{1.73_m2} (ref >90)
Glucose: 88 mg/dl (ref 70–140)
Potassium: 4.1 mEq/L (ref 3.5–5.1)
Sodium: 139 mEq/L (ref 136–145)
TOTAL PROTEIN: 7.4 g/dL (ref 6.4–8.3)

## 2015-07-31 LAB — LACTATE DEHYDROGENASE: LDH: 227 U/L (ref 125–245)

## 2015-07-31 MED ORDER — SODIUM CHLORIDE 0.9 % IV SOLN
Freq: Once | INTRAVENOUS | Status: AC
  Administered 2015-07-31: 14:00:00 via INTRAVENOUS

## 2015-07-31 MED ORDER — ECULIZUMAB 300 MG/30ML IV SOLN
900.0000 mg | Freq: Once | INTRAVENOUS | Status: AC
  Administered 2015-07-31: 900 mg via INTRAVENOUS
  Filled 2015-07-31: qty 90

## 2015-07-31 NOTE — Telephone Encounter (Signed)
Pt confirmed labs/ov per 02/01 POF, gave pt AVS and Calendar.. KJ, sent msg to add chemo °

## 2015-07-31 NOTE — Patient Instructions (Signed)
[facility] Discharge Instructions for Patients Receiving Chemotherapy  Today you received the following chemotherapy agents Solaris.   To help prevent nausea and vomiting after your treatment, we encourage you to take your nausea medication as directed.   If you develop nausea and vomiting that is not controlled by your nausea medication, call the clinic.   BELOW ARE SYMPTOMS THAT SHOULD BE REPORTED IMMEDIATELY:  *FEVER GREATER THAN 100.5 F  *CHILLS WITH OR WITHOUT FEVER  NAUSEA AND VOMITING THAT IS NOT CONTROLLED WITH YOUR NAUSEA MEDICATION  *UNUSUAL SHORTNESS OF BREATH  *UNUSUAL BRUISING OR BLEEDING  TENDERNESS IN MOUTH AND THROAT WITH OR WITHOUT PRESENCE OF ULCERS  *URINARY PROBLEMS  *BOWEL PROBLEMS  UNUSUAL RASH Items with * indicate a potential emergency and should be followed up as soon as possible.  Feel free to call the clinic you have any questions or concerns. The clinic phone number is [phone].  Please show the CHEMO ALERT CARD at check-in to the Emergency Department and triage nurse.   

## 2015-07-31 NOTE — Progress Notes (Addendum)
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   PRIOR THERAPY:  1) Tapered dose of prednisone. 2) High dose of IVIG   CURRENT THERAPY: Soliris (Eculizumab) induction with 600 mg IV weekly 4 weeks followed by maintenance treatment at a dose of 900 mg IV every 2 weeks. First dose of treatment is expected on 11/21/2014. Status post 9 cycles. He is here today to start day 1 of cycle #10.  INTERVAL HISTORY: Travis Mcdaniel 45 y.o. male returns to the clinic today for follow-up visit. He is tolerating his treatment with Soliris fairly well with no significant adverse effects. He continues to drink alcohol but only on the weekends .He denied having any significant fever or chills. He has no nausea or vomiting. He denied having any significant chest pain, shortness of breath, cough or hemoptysis. He has no significant weight loss or night sweats. He continues to drink alcohol a few times a week.  He is here today to start cycle #10 of his treatment with Soliris.  MEDICAL HISTORY: Past Medical History  Diagnosis Date  . Diverticulosis     ALLERGIES:  has No Known Allergies.  MEDICATIONS:  Current Outpatient Prescriptions  Medication Sig Dispense Refill  . ALPRAZolam (XANAX) 0.5 MG tablet 3 (three) times daily as needed.     . Multiple Vitamin (MULTIVITAMIN WITH MINERALS) TABS tablet Take 1 tablet by mouth daily.    . Psyllium (METAMUCIL PO) Take 1 capsule by mouth daily.     No current facility-administered medications for this visit.    SURGICAL HISTORY: History reviewed. No pertinent past surgical history.  REVIEW OF SYSTEMS:  Constitutional: negative Eyes: negative Ears, nose, mouth, throat, and face: negative Respiratory: negative Cardiovascular:  negative Gastrointestinal: negative Genitourinary:negative Integument/breast: negative Hematologic/lymphatic: negative Musculoskeletal:positive for back pain Neurological: negative Behavioral/Psych: positive for anxiety Endocrine: negative Allergic/Immunologic: negative   PHYSICAL EXAMINATION: General appearance: alert, cooperative and no distress Head: Normocephalic, without obvious abnormality, atraumatic Neck: no adenopathy, no JVD, supple, symmetrical, trachea midline and thyroid not enlarged, symmetric, no tenderness/mass/nodules Lymph nodes: Cervical, supraclavicular, and axillary nodes normal. Resp: clear to auscultation bilaterally Back: symmetric, no curvature. ROM normal. No CVA tenderness. Cardio: regular rate and rhythm, S1, S2 normal, no murmur, click, rub or gallop GI: soft, non-tender; bowel sounds normal; no masses,  no organomegaly Extremities: extremities normal, atraumatic, no cyanosis or edema Neurologic: Alert and oriented X 3, normal strength and tone. Normal symmetric reflexes. Normal coordination and gait  ECOG PERFORMANCE STATUS: 1 - Symptomatic but completely ambulatory  Blood pressure 140/67, pulse 81, temperature 98.4 F (36.9 C), temperature source Oral, resp. rate 18, weight 167 lb 8 oz (75.978 kg), SpO2 100 %.  LABORATORY DATA: Lab Results  Component Value Date   WBC 4.8 07/31/2015   HGB 12.9* 07/31/2015   HCT 38.7 07/31/2015   MCV 106.7* 07/31/2015   PLT 123* 07/31/2015      Chemistry      Component Value Date/Time   NA 139 07/31/2015 1054   NA 140 09/07/2014 0407   K 4.1 07/31/2015 1054   K 3.9 09/07/2014 0407   CL 110 09/07/2014 0407   CO2 23 07/31/2015 1054   CO2 22 09/07/2014 0407   BUN 15.2 07/31/2015 1054   BUN 12 09/07/2014 0407   CREATININE 0.9 07/31/2015 1054  CREATININE 0.99 09/07/2014 0407      Component Value Date/Time   CALCIUM 9.2 07/31/2015 1054   CALCIUM 8.3* 09/07/2014 0407   ALKPHOS 69 07/31/2015 1054   ALKPHOS  30* 09/07/2014 0407   AST 26 07/31/2015 1054   AST 120* 09/07/2014 0407   ALT 27 07/31/2015 1054   ALT 27 09/07/2014 0407   BILITOT 2.21* 07/31/2015 1054   BILITOT 0.7 09/07/2014 0407       RADIOGRAPHIC STUDIES: No results found.  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 He is currently on treatment with Soliris. He is status post 9 cycles of treatment.  The patient is tolerating his treatment fairly well. I recommended for him to continue his treatment as a scheduled.  He will start cycle #9 today.He would come back for follow-up visit in 4 weeks for reevaluation before starting cycle #11. The patient was also advised to take daily aspirin during his treatment with Soliris. For the hyperbilirubinemia, this is stable and we will continue to monitor it closely. For hypertension, he is currently managed by Dr. [name]. The patient was advised to call immediately if he has any concerning symptoms in the interval. The patient voices understanding of current disease status and treatment options and is in agreement with the current care plan.  All questions were answered. The patient knows to call the clinic with any problems, questions or concerns. We can certainly see the patient much sooner if necessary.  Disclaimer: This note was dictated with voice recognition software. Similar sounding words can inadvertently be transcribed and may not be corrected upon review.

## 2015-07-31 NOTE — Telephone Encounter (Signed)
Per staff message and POF I have scheduled appts. Advised scheduler of appts. JMW  

## 2015-08-14 ENCOUNTER — Ambulatory Visit (HOSPITAL_BASED_OUTPATIENT_CLINIC_OR_DEPARTMENT_OTHER): Payer: Commercial Managed Care - HMO

## 2015-08-14 ENCOUNTER — Other Ambulatory Visit (HOSPITAL_BASED_OUTPATIENT_CLINIC_OR_DEPARTMENT_OTHER): Payer: Commercial Managed Care - HMO

## 2015-08-14 VITALS — BP 162/83 | HR 84 | Temp 97.9°F | Resp 16

## 2015-08-14 DIAGNOSIS — D595 Paroxysmal nocturnal hemoglobinuria [Marchiafava-Micheli]: Secondary | ICD-10-CM

## 2015-08-14 DIAGNOSIS — Z5112 Encounter for antineoplastic immunotherapy: Secondary | ICD-10-CM | POA: Diagnosis not present

## 2015-08-14 DIAGNOSIS — I10 Essential (primary) hypertension: Secondary | ICD-10-CM

## 2015-08-14 LAB — COMPREHENSIVE METABOLIC PANEL
ALBUMIN: 4.4 g/dL (ref 3.5–5.0)
ALT: 27 U/L (ref 0–55)
AST: 29 U/L (ref 5–34)
Alkaline Phosphatase: 100 U/L (ref 40–150)
Anion Gap: 11 mEq/L (ref 3–11)
BILIRUBIN TOTAL: 2.61 mg/dL — AB (ref 0.20–1.20)
BUN: 10.3 mg/dL (ref 7.0–26.0)
CO2: 22 meq/L (ref 22–29)
Calcium: 9.2 mg/dL (ref 8.4–10.4)
Chloride: 105 mEq/L (ref 98–109)
Creatinine: 0.8 mg/dL (ref 0.7–1.3)
GLUCOSE: 91 mg/dL (ref 70–140)
Potassium: 4.1 mEq/L (ref 3.5–5.1)
SODIUM: 139 meq/L (ref 136–145)
TOTAL PROTEIN: 7.6 g/dL (ref 6.4–8.3)

## 2015-08-14 LAB — CBC WITH DIFFERENTIAL/PLATELET
BASO%: 0.5 % (ref 0.0–2.0)
Basophils Absolute: 0 10*3/uL (ref 0.0–0.1)
EOS%: 2.3 % (ref 0.0–7.0)
Eosinophils Absolute: 0.2 10*3/uL (ref 0.0–0.5)
HCT: 38.9 % (ref 38.4–49.9)
HEMOGLOBIN: 12.9 g/dL — AB (ref 13.0–17.1)
LYMPH%: 19.4 % (ref 14.0–49.0)
MCH: 35.4 pg — ABNORMAL HIGH (ref 27.2–33.4)
MCHC: 33.2 g/dL (ref 32.0–36.0)
MCV: 106.9 fL — AB (ref 79.3–98.0)
MONO#: 0.5 10*3/uL (ref 0.1–0.9)
MONO%: 6.9 % (ref 0.0–14.0)
NEUT%: 70.9 % (ref 39.0–75.0)
NEUTROS ABS: 5.5 10*3/uL (ref 1.5–6.5)
Platelets: 132 10*3/uL — ABNORMAL LOW (ref 140–400)
RBC: 3.64 10*6/uL — AB (ref 4.20–5.82)
RDW: 14.3 % (ref 11.0–14.6)
WBC: 7.7 10*3/uL (ref 4.0–10.3)
lymph#: 1.5 10*3/uL (ref 0.9–3.3)

## 2015-08-14 LAB — LACTATE DEHYDROGENASE: LDH: 282 U/L — ABNORMAL HIGH (ref 125–245)

## 2015-08-14 MED ORDER — SODIUM CHLORIDE 0.9 % IV SOLN
Freq: Once | INTRAVENOUS | Status: AC
  Administered 2015-08-14: 12:00:00 via INTRAVENOUS

## 2015-08-14 MED ORDER — SODIUM CHLORIDE 0.9 % IV SOLN
900.0000 mg | Freq: Once | INTRAVENOUS | Status: AC
  Administered 2015-08-14: 900 mg via INTRAVENOUS
  Filled 2015-08-14: qty 90

## 2015-08-14 NOTE — Progress Notes (Signed)
Patient refused to stay for 1 hour observation, post Soliris infusion.  Vital signs stable at discharge.  Patient discharged home with no complaints.

## 2015-08-14 NOTE — Patient Instructions (Signed)
[facility]charge [facility] for Patients Receiving Chemotherapy  Today you received the following chemotherapy agents Soliris  To help prevent nausea and vomiting after your treatment, we encourage you to take your nausea medication.   If you develop nausea and vomiting that is not controlled by your nausea medication, call the clinic.   BELOW ARE SYMPTOMS THAT SHOULD BE REPORTED IMMEDIATELY:  *FEVER GREATER THAN 100.5 F  *CHILLS WITH OR WITHOUT FEVER  NAUSEA AND VOMITING THAT IS NOT CONTROLLED WITH YOUR NAUSEA MEDICATION  *UNUSUAL SHORTNESS OF BREATH  *UNUSUAL BRUISING OR BLEEDING  TENDERNESS IN MOUTH AND THROAT WITH OR WITHOUT PRESENCE OF ULCERS  *URINARY PROBLEMS  *BOWEL PROBLEMS  UNUSUAL RASH Items with * indicate a potential emergency and should be followed up as soon as possible.  Feel free to call the clinic you have any questions or concerns. The clinic phone number is [phone].  Please show the CHEMO ALERT CARD at check-in to the Emergency Department and triage nurse.   

## 2015-08-28 ENCOUNTER — Other Ambulatory Visit (HOSPITAL_BASED_OUTPATIENT_CLINIC_OR_DEPARTMENT_OTHER): Payer: Commercial Managed Care - HMO

## 2015-08-28 ENCOUNTER — Telehealth: Payer: Self-pay | Admitting: Nurse Practitioner

## 2015-08-28 ENCOUNTER — Ambulatory Visit (HOSPITAL_BASED_OUTPATIENT_CLINIC_OR_DEPARTMENT_OTHER): Payer: Commercial Managed Care - HMO | Admitting: Nurse Practitioner

## 2015-08-28 ENCOUNTER — Ambulatory Visit (HOSPITAL_BASED_OUTPATIENT_CLINIC_OR_DEPARTMENT_OTHER): Payer: Commercial Managed Care - HMO

## 2015-08-28 ENCOUNTER — Encounter: Payer: Self-pay | Admitting: Nurse Practitioner

## 2015-08-28 ENCOUNTER — Other Ambulatory Visit: Payer: Self-pay | Admitting: Medical Oncology

## 2015-08-28 DIAGNOSIS — D595 Paroxysmal nocturnal hemoglobinuria [Marchiafava-Micheli]: Secondary | ICD-10-CM

## 2015-08-28 DIAGNOSIS — Z5112 Encounter for antineoplastic immunotherapy: Secondary | ICD-10-CM

## 2015-08-28 DIAGNOSIS — I10 Essential (primary) hypertension: Secondary | ICD-10-CM

## 2015-08-28 LAB — LACTATE DEHYDROGENASE: LDH: 267 U/L — ABNORMAL HIGH (ref 125–245)

## 2015-08-28 LAB — CBC WITH DIFFERENTIAL/PLATELET
BASO%: 0.9 % (ref 0.0–2.0)
BASOS ABS: 0 10*3/uL (ref 0.0–0.1)
EOS ABS: 0.2 10*3/uL (ref 0.0–0.5)
EOS%: 4.3 % (ref 0.0–7.0)
HCT: 34.7 % — ABNORMAL LOW (ref 38.4–49.9)
HGB: 11.3 g/dL — ABNORMAL LOW (ref 13.0–17.1)
LYMPH%: 27.2 % (ref 14.0–49.0)
MCH: 35.4 pg — AB (ref 27.2–33.4)
MCHC: 32.7 g/dL (ref 32.0–36.0)
MCV: 108.4 fL — AB (ref 79.3–98.0)
MONO#: 0.4 10*3/uL (ref 0.1–0.9)
MONO%: 9.2 % (ref 0.0–14.0)
NEUT#: 2.8 10*3/uL (ref 1.5–6.5)
NEUT%: 58.4 % (ref 39.0–75.0)
Platelets: 135 10*3/uL — ABNORMAL LOW (ref 140–400)
RBC: 3.2 10*6/uL — AB (ref 4.20–5.82)
RDW: 16.1 % — AB (ref 11.0–14.6)
WBC: 4.7 10*3/uL (ref 4.0–10.3)
lymph#: 1.3 10*3/uL (ref 0.9–3.3)

## 2015-08-28 LAB — COMPREHENSIVE METABOLIC PANEL
ALK PHOS: 75 U/L (ref 40–150)
ALT: 27 U/L (ref 0–55)
AST: 29 U/L (ref 5–34)
Albumin: 4.1 g/dL (ref 3.5–5.0)
Anion Gap: 8 mEq/L (ref 3–11)
BUN: 11.1 mg/dL (ref 7.0–26.0)
CHLORIDE: 107 meq/L (ref 98–109)
CO2: 23 mEq/L (ref 22–29)
Calcium: 8.8 mg/dL (ref 8.4–10.4)
Creatinine: 0.8 mg/dL (ref 0.7–1.3)
GLUCOSE: 91 mg/dL (ref 70–140)
POTASSIUM: 4 meq/L (ref 3.5–5.1)
SODIUM: 138 meq/L (ref 136–145)
Total Bilirubin: 1.65 mg/dL — ABNORMAL HIGH (ref 0.20–1.20)
Total Protein: 7 g/dL (ref 6.4–8.3)

## 2015-08-28 MED ORDER — SODIUM CHLORIDE 0.9 % IV SOLN
900.0000 mg | Freq: Once | INTRAVENOUS | Status: AC
  Administered 2015-08-28: 900 mg via INTRAVENOUS
  Filled 2015-08-28: qty 90

## 2015-08-28 MED ORDER — SODIUM CHLORIDE 0.9 % IV SOLN
Freq: Once | INTRAVENOUS | Status: AC
  Administered 2015-08-28: 13:00:00 via INTRAVENOUS

## 2015-08-28 NOTE — Telephone Encounter (Signed)
per poCyndee for pt to follow sch appts

## 2015-08-28 NOTE — Patient Instructions (Signed)
[facility] Discharge Instructions for Patients Receiving Chemotherapy  Today you received the following chemotherapy agents Soliris  To help prevent nausea and vomiting after your treatment, we encourage you to take your nausea medication.   If you develop nausea and vomiting that is not controlled by your nausea medication, call the clinic.   BELOW ARE SYMPTOMS THAT SHOULD BE REPORTED IMMEDIATELY:  *FEVER GREATER THAN 100.5 F  *CHILLS WITH OR WITHOUT FEVER  NAUSEA AND VOMITING THAT IS NOT CONTROLLED WITH YOUR NAUSEA MEDICATION  *UNUSUAL SHORTNESS OF BREATH  *UNUSUAL BRUISING OR BLEEDING  TENDERNESS IN MOUTH AND THROAT WITH OR WITHOUT PRESENCE OF ULCERS  *URINARY PROBLEMS  *BOWEL PROBLEMS  UNUSUAL RASH Items with * indicate a potential emergency and should be followed up as soon as possible.  Feel free to call the clinic you have any questions or concerns. The clinic phone number is [phone].  Please show the CHEMO ALERT CARD at check-in to the Emergency Department and triage nurse.   

## 2015-08-28 NOTE — Telephone Encounter (Signed)
per pof to sch pt appt-gave pt copy of avs °

## 2015-08-28 NOTE — Assessment & Plan Note (Signed)
Patient presented to the [facility] today to receive cycle 11 of his Soliris antibody therapy.  He receives this antibody infusion on an every two-week basis.  Patient states he's been doing very well recently.  He denies any issues with fatigue or appetite.  He denies any hematuria, GI symptoms, or recent fever/chills.  He actually states that he feels better than he has in several years.  Blood counts obtained are essentially within normal limits.  Labs today show a slight improvement in bilirubin as well.  Will continue to monitor closely.  Patient will proceed today with his Soliris antibody infusion.    He will return for labs and the same antibody infusion on 09/11/2015.  He will return for labs, visit, and his next antibody infusion on 09/25/2015.  He noticed, intermittent with any new worries or concerns.

## 2015-08-28 NOTE — Assessment & Plan Note (Signed)
Bilirubin has improved from 2.61 down to 1.65.  Liver enzymes remained stable.  Patient states that he has greatly decreased the amount of alcohol he consumes on a weekly basis.  He states he only occasionally drinks beer on Fridays, Saturdays.  We'll continue to monitor bilirubin closely.

## 2015-08-28 NOTE — Progress Notes (Signed)
SYMPTOM MANAGEMENT CLINIC   HPI: Travis Mcdaniel 45 y.o. male diagnosed with proximal nocturnal hemoglobinuria.  Currently undergoing Soliris antibody therapy.   Patient presented to the [facility] today to receive cycle 11 of his Soliris antibody therapy.  He receives this antibody infusion on an every two-week basis.  Patient states he's been doing very well recently.  He denies any issues with fatigue or appetite.  He denies any hematuria, GI symptoms, or recent fever/chills.  He actually states that he feels better than he has in several years.  Blood counts obtained are essentially within normal limits.  Labs today show a slight improvement in bilirubin as well.  Will continue to monitor closely.  Patient will proceed today with his Soliris antibody infusion.    He will return for labs and the same antibody infusion on 09/11/2015.  He will return for labs, visit, and his next antibody infusion on 09/25/2015.  He noticed, intermittent with any new worries or concerns.  HPI  Review of Systems  All other systems reviewed and are negative.   Past Medical History  Diagnosis Date  . Diverticulosis     History reviewed. No pertinent past surgical history.  has Acute kidney injury ([place]); UTI (lower urinary tract infection); Alcohol dependence ([place]); Diarrhea; Absolute anemia; Influenza A (H1N1); Thrombocytopenia ([place]); Anxiety; Abscess; Hypertension; Hyperbilirubinemia; and Paroxysmal nocturnal hemoglobinuria (PNH) (HCC) on his problem list.    has No Known Allergies.    Medication List       This list is accurate as of: 08/28/15  2:10 PM.  Always use your most recent med list.               ALPRAZolam 0.5 MG tablet  Commonly known as:  XANAX  3 (three) times daily as needed.     METAMUCIL PO  Take 1 capsule by mouth daily.     multivitamin with minerals Tabs tablet  Take 1 tablet by mouth daily.         PHYSICAL EXAMINATION  Oncology Vitals 08/28/2015  08/14/2015  Height 168 cm -  Weight 75.841 kg -  Weight (lbs) 167 lbs 3 oz -  BMI (kg/m2) 26.99 kg/m2 -  Temp 97.9 97.9  Pulse 65 84  Resp 18 16  SpO2 100 100  BSA (m2) 1.88 m2 -   BP Readings from Last 2 Encounters:  08/28/15 140/77  08/14/15 162/83    Physical Exam  Constitutional: He is oriented to person, place, and time and well-developed, well-nourished, and in no distress.  HENT:  Head: Normocephalic and atraumatic.  Mouth/Throat: Oropharynx is clear and moist.  Eyes: Conjunctivae and EOM are normal. Pupils are equal, round, and reactive to light. Right eye exhibits no discharge. Left eye exhibits no discharge. No scleral icterus.  Neck: Normal range of motion.  Pulmonary/Chest: Effort normal. No respiratory distress.  Musculoskeletal: Normal range of motion.  Neurological: He is alert and oriented to person, place, and time. Gait normal.  Psychiatric: Affect normal.  Nursing note and vitals reviewed.   LABORATORY DATA:. Appointment on 08/28/2015  Component Date Value Ref Range Status  . WBC 08/28/2015 4.7  4.0 - 10.3 10e3/uL Final  . NEUT# 08/28/2015 2.8  1.5 - 6.5 10e3/uL Final  . HGB 08/28/2015 11.3* 13.0 - 17.1 g/dL Final  . HCT 08/28/2015 34.7* 38.4 - 49.9 % Final  . Platelets 08/28/2015 135* 140 - 400 10e3/uL Final  . MCV 08/28/2015 108.4* 79.3 - 98.0 fL Final  . MCH  08/28/2015 35.4* 27.2 - 33.4 pg Final  . MCHC 08/28/2015 32.7  32.0 - 36.0 g/dL Final  . RBC 08/28/2015 3.20* 4.20 - 5.82 10e6/uL Final  . RDW 08/28/2015 16.1* 11.0 - 14.6 % Final  . lymph# 08/28/2015 1.3  0.9 - 3.3 10e3/uL Final  . MONO# 08/28/2015 0.4  0.1 - 0.9 10e3/uL Final  . Eosinophils Absolute 08/28/2015 0.2  0.0 - 0.5 10e3/uL Final  . Basophils Absolute 08/28/2015 0.0  0.0 - 0.1 10e3/uL Final  . NEUT% 08/28/2015 58.4  39.0 - 75.0 % Final  . LYMPH% 08/28/2015 27.2  14.0 - 49.0 % Final  . MONO% 08/28/2015 9.2  0.0 - 14.0 % Final  . EOS% 08/28/2015 4.3  0.0 - 7.0 % Final  . BASO%  08/28/2015 0.9  0.0 - 2.0 % Final  . Sodium 08/28/2015 138  136 - 145 mEq/L Final  . Potassium 08/28/2015 4.0  3.5 - 5.1 mEq/L Final  . Chloride 08/28/2015 107  98 - 109 mEq/L Final  . CO2 08/28/2015 23  22 - 29 mEq/L Final  . Glucose 08/28/2015 91  70 - 140 mg/dl Final   Glucose reference range is for nonfasting patients. Fasting glucose reference range is 70- 100.  [name] Kitchen BUN 08/28/2015 11.1  7.0 - 26.0 mg/dL Final  . Creatinine 08/28/2015 0.8  0.7 - 1.3 mg/dL Final  . Total Bilirubin 08/28/2015 1.65* 0.20 - 1.20 mg/dL Final  . Alkaline Phosphatase 08/28/2015 75  40 - 150 U/L Final  . AST 08/28/2015 29  5 - 34 U/L Final  . ALT 08/28/2015 27  0 - 55 U/L Final  . Total Protein 08/28/2015 7.0  6.4 - 8.3 g/dL Final  . Albumin 08/28/2015 4.1  3.5 - 5.0 g/dL Final  . Calcium 08/28/2015 8.8  8.4 - 10.4 mg/dL Final  . Anion Gap 08/28/2015 8  3 - 11 mEq/L Final  . EGFR 08/28/2015 >90  >90 ml/min/1.73 m2 Final   eGFR is calculated using the CKD-EPI Creatinine Equation (2009)  . LDH 08/28/2015 267* 125 - 245 U/L Final     RADIOGRAPHIC STUDIES: No results found.  ASSESSMENT/PLAN:    Paroxysmal nocturnal hemoglobinuria (PNH) (HCC) Patient presented to the [facility] today to receive cycle 11 of his Soliris antibody therapy.  He receives this antibody infusion on an every two-week basis.  Patient states he's been doing very well recently.  He denies any issues with fatigue or appetite.  He denies any hematuria, GI symptoms, or recent fever/chills.  He actually states that he feels better than he has in several years.  Blood counts obtained are essentially within normal limits.  Labs today show a slight improvement in bilirubin as well.  Will continue to monitor closely.  Patient will proceed today with his Soliris antibody infusion.    He will return for labs and the same antibody infusion on 09/11/2015.  He will return for labs, visit, and his next antibody infusion on 09/25/2015.  He  noticed, intermittent with any new worries or concerns.  Hyperbilirubinemia Bilirubin has improved from 2.61 down to 1.65.  Liver enzymes remained stable.  Patient states that he has greatly decreased the amount of alcohol he consumes on a weekly basis.  He states he only occasionally drinks beer on Fridays, Saturdays.  We'll continue to monitor bilirubin closely.   Patient stated understanding of all instructions; and was in agreement with this plan of care. The patient knows to call the clinic with any problems, questions or concerns.  Review/collaboration with Dr. Julien Nordmann regarding all aspects of patient's visit today.   Total time spent with patient was 25 minutes;  with greater than 75 percent of that time spent in face to face counseling regarding patient's symptoms,  and coordination of care and follow up.  Disclaimer:This dictation was prepared with Dragon/digital dictation along with [employer]. Any transcriptional errors that result from this process are unintentional.  Drue Second, NP 08/28/2015

## 2015-09-11 ENCOUNTER — Other Ambulatory Visit (HOSPITAL_BASED_OUTPATIENT_CLINIC_OR_DEPARTMENT_OTHER): Payer: Commercial Managed Care - HMO

## 2015-09-11 ENCOUNTER — Ambulatory Visit (HOSPITAL_BASED_OUTPATIENT_CLINIC_OR_DEPARTMENT_OTHER): Payer: Commercial Managed Care - HMO

## 2015-09-11 VITALS — BP 135/82 | HR 71 | Temp 98.9°F | Resp 18

## 2015-09-11 DIAGNOSIS — D595 Paroxysmal nocturnal hemoglobinuria [Marchiafava-Micheli]: Secondary | ICD-10-CM

## 2015-09-11 DIAGNOSIS — Z5112 Encounter for antineoplastic immunotherapy: Secondary | ICD-10-CM

## 2015-09-11 DIAGNOSIS — I10 Essential (primary) hypertension: Secondary | ICD-10-CM

## 2015-09-11 LAB — COMPREHENSIVE METABOLIC PANEL
ALBUMIN: 4.3 g/dL (ref 3.5–5.0)
ALK PHOS: 74 U/L (ref 40–150)
ALT: 31 U/L (ref 0–55)
ANION GAP: 7 meq/L (ref 3–11)
AST: 27 U/L (ref 5–34)
BILIRUBIN TOTAL: 1.8 mg/dL — AB (ref 0.20–1.20)
BUN: 9.8 mg/dL (ref 7.0–26.0)
CALCIUM: 9 mg/dL (ref 8.4–10.4)
CO2: 25 meq/L (ref 22–29)
CREATININE: 0.8 mg/dL (ref 0.7–1.3)
Chloride: 108 mEq/L (ref 98–109)
Glucose: 90 mg/dl (ref 70–140)
Potassium: 4.1 mEq/L (ref 3.5–5.1)
Sodium: 140 mEq/L (ref 136–145)
TOTAL PROTEIN: 7.3 g/dL (ref 6.4–8.3)

## 2015-09-11 LAB — CBC WITH DIFFERENTIAL/PLATELET
BASO%: 0.4 % (ref 0.0–2.0)
Basophils Absolute: 0 10*3/uL (ref 0.0–0.1)
EOS ABS: 0.3 10*3/uL (ref 0.0–0.5)
EOS%: 6 % (ref 0.0–7.0)
HEMATOCRIT: 39.6 % (ref 38.4–49.9)
HEMOGLOBIN: 12.9 g/dL — AB (ref 13.0–17.1)
LYMPH#: 1.6 10*3/uL (ref 0.9–3.3)
LYMPH%: 31.6 % (ref 14.0–49.0)
MCH: 35.6 pg — ABNORMAL HIGH (ref 27.2–33.4)
MCHC: 32.6 g/dL (ref 32.0–36.0)
MCV: 109.4 fL — AB (ref 79.3–98.0)
MONO#: 0.4 10*3/uL (ref 0.1–0.9)
MONO%: 8.2 % (ref 0.0–14.0)
NEUT%: 53.8 % (ref 39.0–75.0)
NEUTROS ABS: 2.7 10*3/uL (ref 1.5–6.5)
PLATELETS: 128 10*3/uL — AB (ref 140–400)
RBC: 3.62 10*6/uL — AB (ref 4.20–5.82)
RDW: 15.1 % — AB (ref 11.0–14.6)
WBC: 5 10*3/uL (ref 4.0–10.3)

## 2015-09-11 LAB — LACTATE DEHYDROGENASE: LDH: 220 U/L (ref 125–245)

## 2015-09-11 MED ORDER — SODIUM CHLORIDE 0.9 % IV SOLN
900.0000 mg | Freq: Once | INTRAVENOUS | Status: AC
  Administered 2015-09-11: 900 mg via INTRAVENOUS
  Filled 2015-09-11: qty 90

## 2015-09-11 MED ORDER — SODIUM CHLORIDE 0.9 % IV SOLN
Freq: Once | INTRAVENOUS | Status: AC
  Administered 2015-09-11: 12:00:00 via INTRAVENOUS

## 2015-09-11 NOTE — Patient Instructions (Signed)
[facility] Discharge Instructions for Patients Receiving Chemotherapy  Today you received the following chemotherapy agents: soliris  To help prevent nausea and vomiting after your treatment, we encourage you to take your nausea medication.  Take it as often as prescribed.     If you develop nausea and vomiting that is not controlled by your nausea medication, call the clinic. If it is after clinic hours your family physician or the after hours number for the clinic or go to the Emergency Department.   BELOW ARE SYMPTOMS THAT SHOULD BE REPORTED IMMEDIATELY:  *FEVER GREATER THAN 100.5 F  *CHILLS WITH OR WITHOUT FEVER  NAUSEA AND VOMITING THAT IS NOT CONTROLLED WITH YOUR NAUSEA MEDICATION  *UNUSUAL SHORTNESS OF BREATH  *UNUSUAL BRUISING OR BLEEDING  TENDERNESS IN MOUTH AND THROAT WITH OR WITHOUT PRESENCE OF ULCERS  *URINARY PROBLEMS  *BOWEL PROBLEMS  UNUSUAL RASH Items with * indicate a potential emergency and should be followed up as soon as possible.  One of the nurses will contact you 24 hours after your treatment. Please let the nurse know about any problems that you may have experienced. Feel free to call the clinic you have any questions or concerns. The clinic phone number is [phone].   I have been informed and understand all the instructions given to me. I know to contact the clinic, my physician, or go to the Emergency Department if any problems should occur. I do not have any questions at this time, but understand that I may call the clinic during office hours   should I have any questions or need assistance in obtaining follow up care.    __________________________________________  _____________  __________ Signature of Patient or Authorized Representative            Date                   Time    __________________________________________ Nurse's Signature

## 2015-09-25 ENCOUNTER — Telehealth: Payer: Self-pay | Admitting: Internal Medicine

## 2015-09-25 ENCOUNTER — Encounter: Payer: Self-pay | Admitting: Internal Medicine

## 2015-09-25 ENCOUNTER — Ambulatory Visit (HOSPITAL_BASED_OUTPATIENT_CLINIC_OR_DEPARTMENT_OTHER): Payer: Commercial Managed Care - HMO

## 2015-09-25 ENCOUNTER — Ambulatory Visit (HOSPITAL_BASED_OUTPATIENT_CLINIC_OR_DEPARTMENT_OTHER): Payer: Commercial Managed Care - HMO | Admitting: Internal Medicine

## 2015-09-25 ENCOUNTER — Other Ambulatory Visit (HOSPITAL_BASED_OUTPATIENT_CLINIC_OR_DEPARTMENT_OTHER): Payer: Commercial Managed Care - HMO

## 2015-09-25 VITALS — BP 122/86

## 2015-09-25 VITALS — BP 127/70 | HR 75 | Temp 98.6°F | Resp 18 | Ht 66.0 in | Wt 168.0 lb

## 2015-09-25 DIAGNOSIS — D595 Paroxysmal nocturnal hemoglobinuria [Marchiafava-Micheli]: Secondary | ICD-10-CM

## 2015-09-25 DIAGNOSIS — Z5112 Encounter for antineoplastic immunotherapy: Secondary | ICD-10-CM

## 2015-09-25 DIAGNOSIS — I10 Essential (primary) hypertension: Secondary | ICD-10-CM

## 2015-09-25 DIAGNOSIS — D696 Thrombocytopenia, unspecified: Secondary | ICD-10-CM

## 2015-09-25 LAB — CBC WITH DIFFERENTIAL/PLATELET
BASO%: 0.3 % (ref 0.0–2.0)
Basophils Absolute: 0 10*3/uL (ref 0.0–0.1)
EOS ABS: 0.3 10*3/uL (ref 0.0–0.5)
EOS%: 4.6 % (ref 0.0–7.0)
HCT: 38 % — ABNORMAL LOW (ref 38.4–49.9)
HEMOGLOBIN: 12.8 g/dL — AB (ref 13.0–17.1)
LYMPH%: 28.3 % (ref 14.0–49.0)
MCH: 36.3 pg — ABNORMAL HIGH (ref 27.2–33.4)
MCHC: 33.7 g/dL (ref 32.0–36.0)
MCV: 107.6 fL — AB (ref 79.3–98.0)
MONO#: 0.5 10*3/uL (ref 0.1–0.9)
MONO%: 8.3 % (ref 0.0–14.0)
NEUT%: 58.5 % (ref 39.0–75.0)
NEUTROS ABS: 3.4 10*3/uL (ref 1.5–6.5)
Platelets: 149 10*3/uL (ref 140–400)
RBC: 3.53 10*6/uL — ABNORMAL LOW (ref 4.20–5.82)
RDW: 14.9 % — AB (ref 11.0–14.6)
WBC: 5.9 10*3/uL (ref 4.0–10.3)
lymph#: 1.7 10*3/uL (ref 0.9–3.3)

## 2015-09-25 LAB — COMPREHENSIVE METABOLIC PANEL
ALT: 44 U/L (ref 0–55)
ANION GAP: 10 meq/L (ref 3–11)
AST: 39 U/L — AB (ref 5–34)
Albumin: 4.2 g/dL (ref 3.5–5.0)
Alkaline Phosphatase: 72 U/L (ref 40–150)
BILIRUBIN TOTAL: 2.44 mg/dL — AB (ref 0.20–1.20)
BUN: 13.5 mg/dL (ref 7.0–26.0)
CHLORIDE: 104 meq/L (ref 98–109)
CO2: 24 meq/L (ref 22–29)
Calcium: 9.2 mg/dL (ref 8.4–10.4)
Creatinine: 0.9 mg/dL (ref 0.7–1.3)
GLUCOSE: 93 mg/dL (ref 70–140)
POTASSIUM: 3.9 meq/L (ref 3.5–5.1)
SODIUM: 139 meq/L (ref 136–145)
TOTAL PROTEIN: 7.5 g/dL (ref 6.4–8.3)

## 2015-09-25 LAB — LACTATE DEHYDROGENASE: LDH: 268 U/L — ABNORMAL HIGH (ref 125–245)

## 2015-09-25 MED ORDER — SODIUM CHLORIDE 0.9 % IV SOLN
900.0000 mg | Freq: Once | INTRAVENOUS | Status: AC
  Administered 2015-09-25: 900 mg via INTRAVENOUS
  Filled 2015-09-25: qty 90

## 2015-09-25 MED ORDER — SODIUM CHLORIDE 0.9 % IV SOLN
Freq: Once | INTRAVENOUS | Status: AC
  Administered 2015-09-25: 13:00:00 via INTRAVENOUS

## 2015-09-25 NOTE — Telephone Encounter (Signed)
Gave and printed appt sched and avs for pt for April and May °

## 2015-09-25 NOTE — Telephone Encounter (Signed)
Gave and printed appt shced and avs for pt for April thru June

## 2015-09-25 NOTE — Progress Notes (Signed)
Pt tolerated soliris w/o any issues. Pt does not want to stay for 1hr post infusion observation.  AVS printed for pt.

## 2015-09-25 NOTE — Progress Notes (Signed)
Travis [phone]   [phone]  OFFICE PROGRESS NOTE  DIAGNOSIS: Recently diagnosed paroxysmal nocturnal hemoglobinuria presented initially as Hemolytic anemia thought to be secondary to infection with influenza A and H1N1.   PRIOR THERAPY:  1) Tapered dose of prednisone. 2) High dose of IVIG   CURRENT THERAPY: Soliris (Eculizumab) induction with 600 mg IV weekly 4 weeks followed by maintenance treatment at a dose of 900 mg IV every 2 weeks. First dose of treatment is expected on 11/21/2014. Status post 11 cycles. He is here today to start day 1 of cycle #12.  INTERVAL HISTORY: Travis Mcdaniel 45 y.o. male returns to the clinic today for follow-up visit. He is tolerating his treatment with Soliris fairly well with no significant adverse effects. He continues to drink alcohol but only on the weekends .his serum bilirubin has been fluctuating up and down over the last few weeks, partially secondary to alcohol or mild hemolysis. He denied having any significant fever or chills. He has no nausea or vomiting. He denied having any significant chest pain, shortness of breath, cough or hemoptysis. He has no significant weight loss or night sweats. He continues to drink alcohol a few times a week.  He is here today to start cycle #12 of his treatment with Soliris.  MEDICAL HISTORY: Past Medical History  Diagnosis Date  . Diverticulosis     ALLERGIES:  has No Known Allergies.  MEDICATIONS:  Current Outpatient Prescriptions  Medication Sig Dispense Refill  . ALPRAZolam (XANAX) 0.5 MG tablet 3 (three) times daily as needed.     . Multiple Vitamin (MULTIVITAMIN WITH MINERALS) TABS tablet Take 1 tablet by mouth daily.    . Psyllium (METAMUCIL PO) Take 1 capsule by mouth daily.     No current facility-administered medications for this visit.    SURGICAL HISTORY: No past surgical history on file.  REVIEW OF SYSTEMS:  A comprehensive review of  systems was negative.   PHYSICAL EXAMINATION: General appearance: alert, cooperative and no distress Head: Normocephalic, without obvious abnormality, atraumatic Neck: no adenopathy, no JVD, supple, symmetrical, trachea midline and thyroid not enlarged, symmetric, no tenderness/mass/nodules Lymph nodes: Cervical, supraclavicular, and axillary nodes normal. Resp: clear to auscultation bilaterally Back: symmetric, no curvature. ROM normal. No CVA tenderness. Cardio: regular rate and rhythm, S1, S2 normal, no murmur, click, rub or gallop GI: soft, non-tender; bowel sounds normal; no masses,  no organomegaly Extremities: extremities normal, atraumatic, no cyanosis or edema Neurologic: Alert and oriented X 3, normal strength and tone. Normal symmetric reflexes. Normal coordination and gait  ECOG PERFORMANCE STATUS: 1 - Symptomatic but completely ambulatory  Blood pressure 127/70, pulse 75, temperature 98.6 F (37 C), temperature source Oral, resp. rate 18, height 5\' 6"  (1.676 m), weight 168 lb (76.204 kg), SpO2 100 %.  LABORATORY DATA: Lab Results  Component Value Date   WBC 5.9 09/25/2015   HGB 12.8* 09/25/2015   HCT 38.0* 09/25/2015   MCV 107.6* 09/25/2015   PLT 149 09/25/2015      Chemistry      Component Value Date/Time   NA 139 09/25/2015 1041   NA 140 09/07/2014 0407   K 3.9 09/25/2015 1041   K 3.9 09/07/2014 0407   CL 110 09/07/2014 0407   CO2 24 09/25/2015 1041   CO2 22 09/07/2014 0407   BUN 13.5 09/25/2015 1041   BUN 12 09/07/2014 0407   CREATININE 0.9 09/25/2015 1041   CREATININE 0.99 09/07/2014 0407  Component Value Date/Time   CALCIUM 9.2 09/25/2015 1041   CALCIUM 8.3* 09/07/2014 0407   ALKPHOS 72 09/25/2015 1041   ALKPHOS 30* 09/07/2014 0407   AST 39* 09/25/2015 1041   AST 120* 09/07/2014 0407   ALT 44 09/25/2015 1041   ALT 27 09/07/2014 0407   BILITOT 2.44* 09/25/2015 1041   BILITOT 0.7 09/07/2014 0407       RADIOGRAPHIC STUDIES: No results  found.  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 He is currently on treatment with Soliris. He is status post 11 cycles of treatment.  The patient is tolerating his treatment fairly well. I recommended for him to continue his treatment as a scheduled.  He will start cycle #12 today.He would come back for follow-up visit in 4 weeks for reevaluation before starting cycle #13. I also discussed with the patient again consideration of referral to a tertiary center for discussion of peripheral blood stem cell transplant. He would like to wait on this option for few more months. The patient was also advised to take daily aspirin during his treatment with Soliris. For the hyperbilirubinemia, this is stable and we will continue to monitor it closely. For hypertension, he is currently on hydrochlorothiazide managed by Dr. [name]. The patient was advised to call immediately if he has any concerning symptoms in the interval. The patient voices understanding of current disease status and treatment options and is in agreement with the current care plan.  All questions were answered. The patient knows to call the clinic with any problems, questions or concerns. We can certainly see the patient much sooner if necessary.  Disclaimer: This note was dictated with voice recognition software. Similar sounding words can inadvertently be transcribed and may not be corrected upon review.

## 2015-10-09 ENCOUNTER — Ambulatory Visit (HOSPITAL_BASED_OUTPATIENT_CLINIC_OR_DEPARTMENT_OTHER): Payer: Commercial Managed Care - HMO

## 2015-10-09 ENCOUNTER — Other Ambulatory Visit (HOSPITAL_BASED_OUTPATIENT_CLINIC_OR_DEPARTMENT_OTHER): Payer: Commercial Managed Care - HMO

## 2015-10-09 VITALS — BP 130/73 | HR 72 | Temp 98.2°F | Resp 18

## 2015-10-09 DIAGNOSIS — Z5112 Encounter for antineoplastic immunotherapy: Secondary | ICD-10-CM | POA: Diagnosis not present

## 2015-10-09 DIAGNOSIS — D595 Paroxysmal nocturnal hemoglobinuria [Marchiafava-Micheli]: Secondary | ICD-10-CM

## 2015-10-09 DIAGNOSIS — I10 Essential (primary) hypertension: Secondary | ICD-10-CM

## 2015-10-09 LAB — CBC WITH DIFFERENTIAL/PLATELET
BASO%: 0.5 % (ref 0.0–2.0)
Basophils Absolute: 0 10*3/uL (ref 0.0–0.1)
EOS ABS: 0.3 10*3/uL (ref 0.0–0.5)
EOS%: 4.5 % (ref 0.0–7.0)
HEMATOCRIT: 37.6 % — AB (ref 38.4–49.9)
HEMOGLOBIN: 12.6 g/dL — AB (ref 13.0–17.1)
LYMPH#: 1.3 10*3/uL (ref 0.9–3.3)
LYMPH%: 24.2 % (ref 14.0–49.0)
MCH: 36.1 pg — ABNORMAL HIGH (ref 27.2–33.4)
MCHC: 33.5 g/dL (ref 32.0–36.0)
MCV: 107.7 fL — AB (ref 79.3–98.0)
MONO#: 0.5 10*3/uL (ref 0.1–0.9)
MONO%: 8.9 % (ref 0.0–14.0)
NEUT%: 61.9 % (ref 39.0–75.0)
NEUTROS ABS: 3.4 10*3/uL (ref 1.5–6.5)
PLATELETS: 138 10*3/uL — AB (ref 140–400)
RBC: 3.49 10*6/uL — AB (ref 4.20–5.82)
RDW: 14.6 % (ref 11.0–14.6)
WBC: 5.5 10*3/uL (ref 4.0–10.3)

## 2015-10-09 LAB — COMPREHENSIVE METABOLIC PANEL
ALBUMIN: 4.2 g/dL (ref 3.5–5.0)
ALK PHOS: 70 U/L (ref 40–150)
ALT: 37 U/L (ref 0–55)
ANION GAP: 10 meq/L (ref 3–11)
AST: 43 U/L — ABNORMAL HIGH (ref 5–34)
BILIRUBIN TOTAL: 1.93 mg/dL — AB (ref 0.20–1.20)
BUN: 11.9 mg/dL (ref 7.0–26.0)
CO2: 23 meq/L (ref 22–29)
CREATININE: 0.8 mg/dL (ref 0.7–1.3)
Calcium: 9.5 mg/dL (ref 8.4–10.4)
Chloride: 106 mEq/L (ref 98–109)
EGFR: >90 mL/min/{1.73_m2} (ref >90)
Glucose: 82 mg/dl (ref 70–140)
Potassium: 4.3 mEq/L (ref 3.5–5.1)
Sodium: 139 mEq/L (ref 136–145)
TOTAL PROTEIN: 7.4 g/dL (ref 6.4–8.3)

## 2015-10-09 LAB — LACTATE DEHYDROGENASE: LDH: 238 U/L (ref 125–245)

## 2015-10-09 MED ORDER — SODIUM CHLORIDE 0.9 % IV SOLN
900.0000 mg | Freq: Once | INTRAVENOUS | Status: AC
  Administered 2015-10-09: 900 mg via INTRAVENOUS
  Filled 2015-10-09: qty 90

## 2015-10-09 MED ORDER — SODIUM CHLORIDE 0.9 % IV SOLN
Freq: Once | INTRAVENOUS | Status: AC
  Administered 2015-10-09: 11:00:00 via INTRAVENOUS

## 2015-10-09 NOTE — Patient Instructions (Signed)
Eculizumab injection What is this medicine? ECULIZUMAB (ek yoo LYE zyoo mab) is a monoclonal antibody. It is used to treat a rare kind of anemia called paroxysmal nocturnal hemoglobinuria or PNH. It may help prevent the loss of blood in patients with PNH. This medicine may be used for other purposes; ask your health care provider or pharmacist if you have questions. What should I tell my health care provider before I take this medicine? They need to know if you have any of these conditions: -infection -not received meningococcal vaccine -an unusual or allergic reaction to this eculizumab, other medicines, foods, dyes, or preservatives -pregnant or trying to get pregnant -breast-feeding How should I use this medicine? This medicine is for infusion into a vein. It is given by a health care professional in a hospital or clinic setting. A special MedGuide will be given to you by the pharmacist with each prescription and refill. Be sure to read this information carefully each time. Talk to your pediatrician regarding the use of this medicine in children. Special care may be needed. Overdosage: If you think you have taken too much of this medicine contact a poison control center or emergency room at once. NOTE: This medicine is only for you. Do not share this medicine with others. What if I miss a dose? It is important not to miss your dose. Call your doctor or health care professional if you are unable to keep an appointment. What may interact with this medicine? Interactions are not expected. This list may not describe all possible interactions. Give your health care provider a list of all the medicines, herbs, non-prescription drugs, or dietary supplements you use. Also tell them if you smoke, drink alcohol, or use illegal drugs. Some items may interact with your medicine. What should I watch for while using this medicine? Your condition will be monitored carefully while you are receiving this  medicine. You will need regular blood work. Tell your doctor or healthcare professional if your symptoms do not start to get better or if they get worse. You may have sudden breakdown of your red blood cells after you stop taking this medicine. You will need to be followed by your doctor for 8 weeks or more after this therapy is complete. This medicine may decrease your body's ability to fight infection. Avoid being around people who are sick. Carry the Patient Safety Card given to you at all times. Seek medical help if you have any of the symptoms listed on the card. What side effects may I notice from receiving this medicine? Side effects that you should report to your doctor or health care professional as soon as possible: -allergic reactions like skin rash, itching or hives, swelling of the face, lips, or tongue -back pain -breathing problems -bruising, pinpoint red spots on the skin -confusion -eyes sensitive to light -fast, irregular heartbeat -fever, chills, or any other sign of infection -pain, swelling, warmth in the leg -pain, tingling, numbness in the hands or feet -problems with balance, talking, walking -seizures -stiff neck -unusually weak or tired -vomiting Side effects that usually do not require medical attention (report to your doctor or health care professional if they continue or are bothersome): -aches and pains -constipation -headache -nausea -runny nose or colds -sore throat This list may not describe all possible side effects. Call your doctor for medical advice about side effects. You may report side effects to FDA at 1-800-FDA-1088. Where should I keep my medicine? This drug is given in a hospital   or clinic and will not be stored at home. NOTE: This sheet is a summary. It may not cover all possible information. If you have questions about this medicine, talk to your doctor, pharmacist, or health care provider.    2016, Elsevier/Gold Standard. (2008-06-12  17:23:14)  

## 2015-10-18 ENCOUNTER — Telehealth: Payer: Self-pay | Admitting: Internal Medicine

## 2015-10-18 NOTE — Telephone Encounter (Signed)
lvm for pt with new time

## 2015-10-23 ENCOUNTER — Ambulatory Visit: Payer: Commercial Managed Care - HMO

## 2015-10-23 ENCOUNTER — Telehealth: Payer: Self-pay

## 2015-10-23 ENCOUNTER — Other Ambulatory Visit: Payer: Commercial Managed Care - HMO

## 2015-10-23 ENCOUNTER — Other Ambulatory Visit (HOSPITAL_BASED_OUTPATIENT_CLINIC_OR_DEPARTMENT_OTHER): Payer: Commercial Managed Care - HMO

## 2015-10-23 ENCOUNTER — Ambulatory Visit (HOSPITAL_BASED_OUTPATIENT_CLINIC_OR_DEPARTMENT_OTHER): Payer: Commercial Managed Care - HMO | Admitting: Nurse Practitioner

## 2015-10-23 ENCOUNTER — Ambulatory Visit: Payer: Commercial Managed Care - HMO | Admitting: Nurse Practitioner

## 2015-10-23 ENCOUNTER — Ambulatory Visit (HOSPITAL_BASED_OUTPATIENT_CLINIC_OR_DEPARTMENT_OTHER): Payer: Commercial Managed Care - HMO

## 2015-10-23 ENCOUNTER — Telehealth: Payer: Self-pay | Admitting: Internal Medicine

## 2015-10-23 VITALS — HR 87 | Temp 98.2°F | Resp 18 | Ht 66.0 in | Wt 172.5 lb

## 2015-10-23 DIAGNOSIS — D595 Paroxysmal nocturnal hemoglobinuria [Marchiafava-Micheli]: Secondary | ICD-10-CM

## 2015-10-23 DIAGNOSIS — I10 Essential (primary) hypertension: Secondary | ICD-10-CM

## 2015-10-23 DIAGNOSIS — Z5112 Encounter for antineoplastic immunotherapy: Secondary | ICD-10-CM | POA: Diagnosis not present

## 2015-10-23 DIAGNOSIS — J09X2 Influenza due to identified novel influenza A virus with other respiratory manifestations: Secondary | ICD-10-CM

## 2015-10-23 LAB — COMPREHENSIVE METABOLIC PANEL
ALT: 55 U/L (ref 0–55)
ANION GAP: 10 meq/L (ref 3–11)
AST: 52 U/L — ABNORMAL HIGH (ref 5–34)
Albumin: 4.3 g/dL (ref 3.5–5.0)
Alkaline Phosphatase: 75 U/L (ref 40–150)
BILIRUBIN TOTAL: 2.33 mg/dL — AB (ref 0.20–1.20)
BUN: 12.7 mg/dL (ref 7.0–26.0)
CHLORIDE: 105 meq/L (ref 98–109)
CO2: 23 mEq/L (ref 22–29)
CREATININE: 0.8 mg/dL (ref 0.7–1.3)
Calcium: 9.1 mg/dL (ref 8.4–10.4)
EGFR: >90 mL/min/{1.73_m2} (ref >90)
GLUCOSE: 91 mg/dL (ref 70–140)
Potassium: 3.8 mEq/L (ref 3.5–5.1)
SODIUM: 138 meq/L (ref 136–145)
TOTAL PROTEIN: 7.2 g/dL (ref 6.4–8.3)

## 2015-10-23 LAB — CBC WITH DIFFERENTIAL/PLATELET
BASO%: 0.5 % (ref 0.0–2.0)
Basophils Absolute: 0 10*3/uL (ref 0.0–0.1)
EOS%: 3.8 % (ref 0.0–7.0)
Eosinophils Absolute: 0.3 10*3/uL (ref 0.0–0.5)
HCT: 33.1 % — ABNORMAL LOW (ref 38.4–49.9)
HGB: 11 g/dL — ABNORMAL LOW (ref 13.0–17.1)
LYMPH%: 28.9 % (ref 14.0–49.0)
MCH: 36.1 pg — ABNORMAL HIGH (ref 27.2–33.4)
MCHC: 33.2 g/dL (ref 32.0–36.0)
MCV: 108.5 fL — ABNORMAL HIGH (ref 79.3–98.0)
MONO#: 0.4 10*3/uL (ref 0.1–0.9)
MONO%: 6.6 % (ref 0.0–14.0)
NEUT%: 60.2 % (ref 39.0–75.0)
NEUTROS ABS: 3.9 10*3/uL (ref 1.5–6.5)
Platelets: 139 10*3/uL — ABNORMAL LOW (ref 140–400)
RBC: 3.05 10*6/uL — AB (ref 4.20–5.82)
RDW: 17.3 % — ABNORMAL HIGH (ref 11.0–14.6)
WBC: 6.5 10*3/uL (ref 4.0–10.3)
lymph#: 1.9 10*3/uL (ref 0.9–3.3)

## 2015-10-23 LAB — LACTATE DEHYDROGENASE: LDH: 334 U/L — ABNORMAL HIGH (ref 125–245)

## 2015-10-23 MED ORDER — SODIUM CHLORIDE 0.9 % IV SOLN
Freq: Once | INTRAVENOUS | Status: AC
  Administered 2015-10-23: 14:00:00 via INTRAVENOUS

## 2015-10-23 MED ORDER — SODIUM CHLORIDE 0.9 % IV SOLN
900.0000 mg | Freq: Once | INTRAVENOUS | Status: AC
  Administered 2015-10-23: 900 mg via INTRAVENOUS
  Filled 2015-10-23: qty 90

## 2015-10-23 NOTE — Progress Notes (Signed)
  [facility] OFFICE PROGRESS NOTE   DIAGNOSIS: Paroxysmal nocturnal hemoglobinuria presented initially as Hemolytic anemia thought to be secondary to infection with influenza A and H1N1.   PRIOR THERAPY:  1) Tapered dose of prednisone. 2) High dose of IVIG   CURRENT THERAPY: Soliris (Eculizumab) induction with 600 mg IV weekly 4 weeks followed by maintenance treatment at a dose of 900 mg IV every 2 weeks. First dose of treatment 11/21/2014. Status post 12 cycles. He is here today to start day 1 of cycle #13.   INTERVAL HISTORY:   Travis Mcdaniel returns as scheduled. He continues every 2 week Soliris. He feels well. No nausea or vomiting. No mouth sores. No diarrhea. He denies pain. He has a good appetite. No shortness of breath. Occasional cough. He denies any bleeding.  Objective:  Vital signs in last 24 hours:  Pulse 87, temperature 98.2 F (36.8 C), temperature source Oral, resp. rate 18, height 5\' 6"  (1.676 m), weight 172 lb 8 oz (78.245 kg), SpO2 100 %.    HEENT: No thrush or ulcers. Resp: Lungs clear bilaterally. Cardio: Regular rate and rhythm. GI: Abdomen soft and nontender. No organomegaly. Vascular: No leg edema. Neuro: Alert and oriented. Gait normal.  Skin: No rash.    Lab Results:  Lab Results  Component Value Date   WBC 6.5 10/23/2015   HGB 11.0* 10/23/2015   HCT 33.1* 10/23/2015   MCV 108.5* 10/23/2015   PLT 139* 10/23/2015   NEUTROABS 3.9 10/23/2015    Imaging:  No results found.  Medications: I have reviewed the patient's current medications.  Assessment/Plan: 1. Paroxysmal nocturnal hemoglobinuria (PNH) currently maintained on treatment with Soliris. 2. Hypertension. He continues hydrochlorothiazide. 3. Hyperbilirubinemia. Fluctuates. Question related to alcohol or mild hemolysis.   Disposition: Travis Mcdaniel appears stable. He has completed 12 cycles of Soliris. Plan to proceed with cycle 13 today as scheduled. He will return  for a follow-up visit in 4 weeks. He will contact the office in the interim with any problems.   I reviewed today's labs with Dr. Julien Nordmann.    Ned Card ANP/GNP-BC   10/23/2015  1:40 PM

## 2015-10-23 NOTE — Telephone Encounter (Signed)
Per pt request, filled out [employer], medical care provider statement for Soliris and faxed back to [facility]. Copy given to patient.

## 2015-10-23 NOTE — Patient Instructions (Signed)
[facility] Discharge Instructions for Patients Receiving Chemotherapy  Today you received the following chemotherapy agents:  Soliris.  To help prevent nausea and vomiting after your treatment, we encourage you to take your nausea medication as directed.   If you develop nausea and vomiting that is not controlled by your nausea medication, call the clinic.   BELOW ARE SYMPTOMS THAT SHOULD BE REPORTED IMMEDIATELY:  *FEVER GREATER THAN 100.5 F  *CHILLS WITH OR WITHOUT FEVER  NAUSEA AND VOMITING THAT IS NOT CONTROLLED WITH YOUR NAUSEA MEDICATION  *UNUSUAL SHORTNESS OF BREATH  *UNUSUAL BRUISING OR BLEEDING  TENDERNESS IN MOUTH AND THROAT WITH OR WITHOUT PRESENCE OF ULCERS  *URINARY PROBLEMS  *BOWEL PROBLEMS  UNUSUAL RASH Items with * indicate a potential emergency and should be followed up as soon as possible.  Feel free to call the clinic you have any questions or concerns. The clinic phone number is [phone].  Please show the CHEMO ALERT CARD at check-in to the Emergency Department and triage nurse.   

## 2015-10-23 NOTE — Telephone Encounter (Signed)
Pt will p/u sched in tx °

## 2015-11-06 ENCOUNTER — Ambulatory Visit: Payer: Commercial Managed Care - HMO

## 2015-11-06 ENCOUNTER — Other Ambulatory Visit: Payer: Commercial Managed Care - HMO

## 2015-11-06 ENCOUNTER — Other Ambulatory Visit (HOSPITAL_BASED_OUTPATIENT_CLINIC_OR_DEPARTMENT_OTHER): Payer: Commercial Managed Care - HMO

## 2015-11-06 ENCOUNTER — Ambulatory Visit (HOSPITAL_BASED_OUTPATIENT_CLINIC_OR_DEPARTMENT_OTHER): Payer: Commercial Managed Care - HMO

## 2015-11-06 ENCOUNTER — Ambulatory Visit: Payer: Commercial Managed Care - HMO | Admitting: Internal Medicine

## 2015-11-06 VITALS — BP 139/77 | HR 71 | Temp 98.6°F | Resp 16

## 2015-11-06 DIAGNOSIS — Z5112 Encounter for antineoplastic immunotherapy: Secondary | ICD-10-CM | POA: Diagnosis not present

## 2015-11-06 DIAGNOSIS — D595 Paroxysmal nocturnal hemoglobinuria [Marchiafava-Micheli]: Secondary | ICD-10-CM

## 2015-11-06 DIAGNOSIS — I10 Essential (primary) hypertension: Secondary | ICD-10-CM

## 2015-11-06 LAB — COMPREHENSIVE METABOLIC PANEL
ALT: 30 U/L (ref 0–55)
AST: 26 U/L (ref 5–34)
Albumin: 4.4 g/dL (ref 3.5–5.0)
Alkaline Phosphatase: 74 U/L (ref 40–150)
Anion Gap: 10 mEq/L (ref 3–11)
BILIRUBIN TOTAL: 2.14 mg/dL — AB (ref 0.20–1.20)
BUN: 11 mg/dL (ref 7.0–26.0)
CO2: 25 meq/L (ref 22–29)
CREATININE: 0.9 mg/dL (ref 0.7–1.3)
Calcium: 9.4 mg/dL (ref 8.4–10.4)
Chloride: 104 mEq/L (ref 98–109)
GLUCOSE: 86 mg/dL (ref 70–140)
Potassium: 3.8 mEq/L (ref 3.5–5.1)
SODIUM: 139 meq/L (ref 136–145)
TOTAL PROTEIN: 7.3 g/dL (ref 6.4–8.3)

## 2015-11-06 LAB — CBC WITH DIFFERENTIAL/PLATELET
BASO%: 1 % (ref 0.0–2.0)
Basophils Absolute: 0 10*3/uL (ref 0.0–0.1)
EOS ABS: 0.2 10*3/uL (ref 0.0–0.5)
EOS%: 3.6 % (ref 0.0–7.0)
HEMATOCRIT: 38.7 % (ref 38.4–49.9)
HEMOGLOBIN: 12.4 g/dL — AB (ref 13.0–17.1)
LYMPH#: 1.3 10*3/uL (ref 0.9–3.3)
LYMPH%: 28.8 % (ref 14.0–49.0)
MCH: 35.2 pg — ABNORMAL HIGH (ref 27.2–33.4)
MCHC: 32.1 g/dL (ref 32.0–36.0)
MCV: 109.6 fL — ABNORMAL HIGH (ref 79.3–98.0)
MONO#: 0.3 10*3/uL (ref 0.1–0.9)
MONO%: 6.9 % (ref 0.0–14.0)
NEUT%: 59.7 % (ref 39.0–75.0)
NEUTROS ABS: 2.8 10*3/uL (ref 1.5–6.5)
Platelets: 147 10*3/uL (ref 140–400)
RBC: 3.53 10*6/uL — AB (ref 4.20–5.82)
RDW: 15.9 % — AB (ref 11.0–14.6)
WBC: 4.6 10*3/uL (ref 4.0–10.3)

## 2015-11-06 LAB — LACTATE DEHYDROGENASE: LDH: 229 U/L (ref 125–245)

## 2015-11-06 MED ORDER — SODIUM CHLORIDE 0.9 % IV SOLN
Freq: Once | INTRAVENOUS | Status: AC
  Administered 2015-11-06: 13:00:00 via INTRAVENOUS

## 2015-11-06 MED ORDER — SODIUM CHLORIDE 0.9 % IV SOLN
900.0000 mg | Freq: Once | INTRAVENOUS | Status: AC
  Administered 2015-11-06: 900 mg via INTRAVENOUS
  Filled 2015-11-06: qty 90

## 2015-11-06 NOTE — Patient Instructions (Signed)
[facility] Discharge Instructions for Patients   Today you received the following: Solaris.   To help prevent nausea and vomiting after your treatment, we encourage you to take your nausea medication as directed.   If you develop nausea and vomiting that is not controlled by your nausea medication, call the clinic.   BELOW ARE SYMPTOMS THAT SHOULD BE REPORTED IMMEDIATELY:  *FEVER GREATER THAN 100.5 F  *CHILLS WITH OR WITHOUT FEVER  NAUSEA AND VOMITING THAT IS NOT CONTROLLED WITH YOUR NAUSEA MEDICATION  *UNUSUAL SHORTNESS OF BREATH  *UNUSUAL BRUISING OR BLEEDING  TENDERNESS IN MOUTH AND THROAT WITH OR WITHOUT PRESENCE OF ULCERS  *URINARY PROBLEMS  *BOWEL PROBLEMS  UNUSUAL RASH Items with * indicate a potential emergency and should be followed up as soon as possible.  Feel free to call the clinic you have any questions or concerns. The clinic phone number is [phone].  Please show the [facility] at check-in to the Emergency Department and triage nurse.

## 2015-11-20 ENCOUNTER — Ambulatory Visit: Payer: Commercial Managed Care - HMO

## 2015-11-20 ENCOUNTER — Encounter: Payer: Self-pay | Admitting: Internal Medicine

## 2015-11-20 ENCOUNTER — Other Ambulatory Visit (HOSPITAL_BASED_OUTPATIENT_CLINIC_OR_DEPARTMENT_OTHER): Payer: Commercial Managed Care - HMO

## 2015-11-20 ENCOUNTER — Ambulatory Visit: Payer: Commercial Managed Care - HMO | Admitting: Internal Medicine

## 2015-11-20 ENCOUNTER — Other Ambulatory Visit: Payer: Commercial Managed Care - HMO

## 2015-11-20 ENCOUNTER — Ambulatory Visit (HOSPITAL_BASED_OUTPATIENT_CLINIC_OR_DEPARTMENT_OTHER): Payer: Commercial Managed Care - HMO | Admitting: Internal Medicine

## 2015-11-20 ENCOUNTER — Ambulatory Visit (HOSPITAL_BASED_OUTPATIENT_CLINIC_OR_DEPARTMENT_OTHER): Payer: Commercial Managed Care - HMO

## 2015-11-20 VITALS — BP 142/77 | HR 70 | Temp 97.7°F | Resp 18 | Ht 66.0 in | Wt 171.4 lb

## 2015-11-20 VITALS — BP 129/80 | HR 76 | Temp 98.2°F | Resp 16

## 2015-11-20 DIAGNOSIS — D595 Paroxysmal nocturnal hemoglobinuria [Marchiafava-Micheli]: Secondary | ICD-10-CM

## 2015-11-20 DIAGNOSIS — Z5112 Encounter for antineoplastic immunotherapy: Secondary | ICD-10-CM

## 2015-11-20 DIAGNOSIS — I10 Essential (primary) hypertension: Secondary | ICD-10-CM | POA: Diagnosis not present

## 2015-11-20 DIAGNOSIS — D696 Thrombocytopenia, unspecified: Secondary | ICD-10-CM

## 2015-11-20 LAB — COMPREHENSIVE METABOLIC PANEL
ALBUMIN: 4.3 g/dL (ref 3.5–5.0)
ALK PHOS: 73 U/L (ref 40–150)
ALT: 38 U/L (ref 0–55)
AST: 32 U/L (ref 5–34)
Anion Gap: 9 mEq/L (ref 3–11)
BUN: 11.7 mg/dL (ref 7.0–26.0)
CALCIUM: 9.2 mg/dL (ref 8.4–10.4)
CO2: 24 mEq/L (ref 22–29)
Chloride: 106 mEq/L (ref 98–109)
Creatinine: 0.8 mg/dL (ref 0.7–1.3)
EGFR: >90 mL/min/{1.73_m2} (ref >90)
Glucose: 94 mg/dl (ref 70–140)
POTASSIUM: 4.1 meq/L (ref 3.5–5.1)
Sodium: 139 mEq/L (ref 136–145)
Total Bilirubin: 2.37 mg/dL — ABNORMAL HIGH (ref 0.20–1.20)
Total Protein: 7.2 g/dL (ref 6.4–8.3)

## 2015-11-20 LAB — CBC WITH DIFFERENTIAL/PLATELET
BASO%: 0.4 % (ref 0.0–2.0)
Basophils Absolute: 0 10*3/uL (ref 0.0–0.1)
EOS ABS: 0.3 10*3/uL (ref 0.0–0.5)
EOS%: 6.5 % (ref 0.0–7.0)
HEMATOCRIT: 39.8 % (ref 38.4–49.9)
HEMOGLOBIN: 13.6 g/dL (ref 13.0–17.1)
LYMPH#: 1.3 10*3/uL (ref 0.9–3.3)
LYMPH%: 28.9 % (ref 14.0–49.0)
MCH: 37.1 pg — AB (ref 27.2–33.4)
MCHC: 34.2 g/dL (ref 32.0–36.0)
MCV: 108.4 fL — AB (ref 79.3–98.0)
MONO#: 0.4 10*3/uL (ref 0.1–0.9)
MONO%: 8.5 % (ref 0.0–14.0)
NEUT#: 2.5 10*3/uL (ref 1.5–6.5)
NEUT%: 55.7 % (ref 39.0–75.0)
PLATELETS: 120 10*3/uL — AB (ref 140–400)
RBC: 3.67 10*6/uL — ABNORMAL LOW (ref 4.20–5.82)
RDW: 14.6 % (ref 11.0–14.6)
WBC: 4.5 10*3/uL (ref 4.0–10.3)

## 2015-11-20 LAB — LACTATE DEHYDROGENASE: LDH: 228 U/L (ref 125–245)

## 2015-11-20 MED ORDER — SODIUM CHLORIDE 0.9 % IV SOLN
Freq: Once | INTRAVENOUS | Status: AC
  Administered 2015-11-20: 11:00:00 via INTRAVENOUS

## 2015-11-20 MED ORDER — SODIUM CHLORIDE 0.9 % IV SOLN
900.0000 mg | Freq: Once | INTRAVENOUS | Status: AC
  Administered 2015-11-20: 900 mg via INTRAVENOUS
  Filled 2015-11-20: qty 90

## 2015-11-20 NOTE — Patient Instructions (Signed)
[facility] Discharge Instructions for Patients   Today you received the following: Solaris.   To help prevent nausea and vomiting after your treatment, we encourage you to take your nausea medication as directed.   If you develop nausea and vomiting that is not controlled by your nausea medication, call the clinic.   BELOW ARE SYMPTOMS THAT SHOULD BE REPORTED IMMEDIATELY:  *FEVER GREATER THAN 100.5 F  *CHILLS WITH OR WITHOUT FEVER  NAUSEA AND VOMITING THAT IS NOT CONTROLLED WITH YOUR NAUSEA MEDICATION  *UNUSUAL SHORTNESS OF BREATH  *UNUSUAL BRUISING OR BLEEDING  TENDERNESS IN MOUTH AND THROAT WITH OR WITHOUT PRESENCE OF ULCERS  *URINARY PROBLEMS  *BOWEL PROBLEMS  UNUSUAL RASH Items with * indicate a potential emergency and should be followed up as soon as possible.  Feel free to call the clinic you have any questions or concerns. The clinic phone number is [phone].  Please show the [facility] at check-in to the Emergency Department and triage nurse.

## 2015-11-20 NOTE — Progress Notes (Signed)
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   PRIOR THERAPY:  1) Tapered dose of prednisone. 2) High dose of IVIG   CURRENT THERAPY: Soliris (Eculizumab) induction with 600 mg IV weekly 4 weeks followed by maintenance treatment at a dose of 900 mg IV every 2 weeks. First dose of treatment is expected on 11/21/2014. Status post 13 cycles. He is here today to start day 1 of cycle #14.  INTERVAL HISTORY: Travis Mcdaniel 45 y.o. male returns to the clinic today for follow-up visit. He is tolerating his treatment with Soliris fairly well with no significant adverse effects. He denied having any significant fever or chills. He has no nausea or vomiting. He denied having any significant chest pain, shortness of breath, cough or hemoptysis. He has no significant weight loss or night sweats. He continues to drink alcohol a few times a week.  He is here today to start cycle #14 of his treatment with Soliris.  MEDICAL HISTORY: Past Medical History  Diagnosis Date  . Diverticulosis     ALLERGIES:  has No Known Allergies.  MEDICATIONS:  Current Outpatient Prescriptions  Medication Sig Dispense Refill  . ALPRAZolam (XANAX) 1 MG tablet Take 1 mg by mouth 3 (three) times daily.  2  . Multiple Vitamin (MULTIVITAMIN WITH MINERALS) TABS tablet Take 1 tablet by mouth daily.    . Psyllium (METAMUCIL PO) Take 1 capsule by mouth daily.    . hydrochlorothiazide (HYDRODIURIL) 25 MG tablet      No current facility-administered medications for this visit.    SURGICAL HISTORY: History reviewed. No pertinent past surgical history.  REVIEW OF SYSTEMS:  A comprehensive review of systems was negative.   PHYSICAL EXAMINATION: General appearance: alert, cooperative and no  distress Head: Normocephalic, without obvious abnormality, atraumatic Neck: no adenopathy, no JVD, supple, symmetrical, trachea midline and thyroid not enlarged, symmetric, no tenderness/mass/nodules Lymph nodes: Cervical, supraclavicular, and axillary nodes normal. Resp: clear to auscultation bilaterally Back: symmetric, no curvature. ROM normal. No CVA tenderness. Cardio: regular rate and rhythm, S1, S2 normal, no murmur, click, rub or gallop GI: soft, non-tender; bowel sounds normal; no masses,  no organomegaly Extremities: extremities normal, atraumatic, no cyanosis or edema Neurologic: Alert and oriented X 3, normal strength and tone. Normal symmetric reflexes. Normal coordination and gait  ECOG PERFORMANCE STATUS: 1 - Symptomatic but completely ambulatory  Blood pressure 142/77, pulse 70, temperature 97.7 F (36.5 C), temperature source Oral, resp. rate 18, height 5\' 6"  (1.676 m), weight 171 lb 6.4 oz (77.747 kg), SpO2 100 %.  LABORATORY DATA: Lab Results  Component Value Date   WBC 4.6 11/06/2015   HGB 12.4* 11/06/2015   HCT 38.7 11/06/2015   MCV 109.6* 11/06/2015   PLT 147 11/06/2015      Chemistry      Component Value Date/Time   NA 139 11/06/2015 1228   NA 140 09/07/2014 0407   K 3.8 11/06/2015 1228   K 3.9 09/07/2014 0407   CL 110 09/07/2014 0407   CO2 25 11/06/2015 1228   CO2 22 09/07/2014 0407   BUN 11.0 11/06/2015 1228   BUN 12 09/07/2014 0407   CREATININE 0.9 11/06/2015 1228   CREATININE 0.99 09/07/2014 0407      Component Value Date/Time   CALCIUM 9.4 11/06/2015 1228  CALCIUM 8.3* 09/07/2014 0407   ALKPHOS 74 11/06/2015 1228   ALKPHOS 30* 09/07/2014 0407   AST 26 11/06/2015 1228   AST 120* 09/07/2014 0407   ALT 30 11/06/2015 1228   ALT 27 09/07/2014 0407   BILITOT 2.14* 11/06/2015 1228   BILITOT 0.7 09/07/2014 0407       RADIOGRAPHIC STUDIES: No results found.  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 He is currently on treatment with Soliris. He is status post 13 cycles of treatment.  The patient is tolerating his treatment fairly well. I recommended for him to continue his treatment as a scheduled.  He will start cycle #14 today.He would come back for follow-up visit in 4 weeks for reevaluation before starting cycle #15. I also discussed with the patient again consideration of referral to a tertiary center for discussion of peripheral blood stem cell transplant. He would like to wait on this option until July because he is planning to move and make some arrangements before proceeding with the transplant. The patient was also advised to take daily aspirin during his treatment with Soliris. For the hyperbilirubinemia, this is stable and we will continue to monitor it closely. For hypertension, he is currently on hydrochlorothiazide managed by Dr. [name]. The patient was advised to call immediately if he has any concerning symptoms in the interval. The patient voices understanding of current disease status and treatment options and is in agreement with the current care plan.  All questions were answered. The patient knows to call the clinic with any problems, questions or concerns. We can certainly see the patient much sooner if necessary.  Disclaimer: This note was dictated with voice recognition software. Similar sounding words can inadvertently be transcribed and may not be corrected upon review.

## 2015-11-20 NOTE — Progress Notes (Signed)
Pt tolerated Soliris infusion without difficulty. Vital signs stable at end of infusion. Pt refused to stay one hour post infusion for observation. Pt aware to call clinic with any concerns or questions. Pt left ambulatory in no apparent distress.

## 2015-12-02 ENCOUNTER — Telehealth: Payer: Self-pay | Admitting: Internal Medicine

## 2015-12-02 NOTE — Telephone Encounter (Signed)
returned call and s.w pt and confirmed appts....pt ok and aware °

## 2015-12-04 ENCOUNTER — Other Ambulatory Visit: Payer: Commercial Managed Care - HMO

## 2015-12-04 ENCOUNTER — Ambulatory Visit: Payer: Commercial Managed Care - HMO

## 2015-12-04 ENCOUNTER — Other Ambulatory Visit (HOSPITAL_BASED_OUTPATIENT_CLINIC_OR_DEPARTMENT_OTHER): Payer: Commercial Managed Care - HMO

## 2015-12-04 ENCOUNTER — Ambulatory Visit (HOSPITAL_BASED_OUTPATIENT_CLINIC_OR_DEPARTMENT_OTHER): Payer: Commercial Managed Care - HMO

## 2015-12-04 ENCOUNTER — Ambulatory Visit: Payer: Commercial Managed Care - HMO | Admitting: Internal Medicine

## 2015-12-04 VITALS — BP 149/76 | HR 67 | Temp 97.9°F | Resp 18

## 2015-12-04 DIAGNOSIS — D595 Paroxysmal nocturnal hemoglobinuria [Marchiafava-Micheli]: Secondary | ICD-10-CM

## 2015-12-04 DIAGNOSIS — I10 Essential (primary) hypertension: Secondary | ICD-10-CM

## 2015-12-04 DIAGNOSIS — Z5112 Encounter for antineoplastic immunotherapy: Secondary | ICD-10-CM | POA: Diagnosis not present

## 2015-12-04 LAB — COMPREHENSIVE METABOLIC PANEL
ALT: 31 U/L (ref 0–55)
ANION GAP: 10 meq/L (ref 3–11)
AST: 23 U/L (ref 5–34)
Albumin: 4.4 g/dL (ref 3.5–5.0)
Alkaline Phosphatase: 64 U/L (ref 40–150)
BUN: 11.6 mg/dL (ref 7.0–26.0)
CHLORIDE: 106 meq/L (ref 98–109)
CO2: 23 meq/L (ref 22–29)
Calcium: 9.1 mg/dL (ref 8.4–10.4)
Creatinine: 0.9 mg/dL (ref 0.7–1.3)
GLUCOSE: 87 mg/dL (ref 70–140)
Potassium: 3.7 mEq/L (ref 3.5–5.1)
SODIUM: 139 meq/L (ref 136–145)
Total Bilirubin: 2.09 mg/dL — ABNORMAL HIGH (ref 0.20–1.20)
Total Protein: 7.2 g/dL (ref 6.4–8.3)

## 2015-12-04 LAB — CBC WITH DIFFERENTIAL/PLATELET
BASO%: 0.8 % (ref 0.0–2.0)
BASOS ABS: 0 10*3/uL (ref 0.0–0.1)
EOS ABS: 0.2 10*3/uL (ref 0.0–0.5)
EOS%: 4.5 % (ref 0.0–7.0)
HCT: 38.9 % (ref 38.4–49.9)
HGB: 12.9 g/dL — ABNORMAL LOW (ref 13.0–17.1)
LYMPH%: 23.5 % (ref 14.0–49.0)
MCH: 35.6 pg — AB (ref 27.2–33.4)
MCHC: 33 g/dL (ref 32.0–36.0)
MCV: 107.7 fL — AB (ref 79.3–98.0)
MONO#: 0.4 10*3/uL (ref 0.1–0.9)
MONO%: 7.3 % (ref 0.0–14.0)
NEUT#: 3.4 10*3/uL (ref 1.5–6.5)
NEUT%: 63.9 % (ref 39.0–75.0)
Platelets: 143 10*3/uL (ref 140–400)
RBC: 3.62 10*6/uL — ABNORMAL LOW (ref 4.20–5.82)
RDW: 14.2 % (ref 11.0–14.6)
WBC: 5.3 10*3/uL (ref 4.0–10.3)
lymph#: 1.3 10*3/uL (ref 0.9–3.3)

## 2015-12-04 LAB — LACTATE DEHYDROGENASE: LDH: 215 U/L (ref 125–245)

## 2015-12-04 MED ORDER — SODIUM CHLORIDE 0.9 % IV SOLN
Freq: Once | INTRAVENOUS | Status: AC
  Administered 2015-12-04: 14:00:00 via INTRAVENOUS

## 2015-12-04 MED ORDER — SODIUM CHLORIDE 0.9 % IV SOLN
900.0000 mg | Freq: Once | INTRAVENOUS | Status: AC
  Administered 2015-12-04: 900 mg via INTRAVENOUS
  Filled 2015-12-04: qty 90

## 2015-12-04 NOTE — Patient Instructions (Signed)
[facility] Discharge Instructions for Patients Receiving Chemotherapy  Today you received the following chemotherapy agents :  Soliris.  To help prevent nausea and vomiting after your treatment, we encourage you to take your nausea medication as prescribed.   If you develop nausea and vomiting that is not controlled by your nausea medication, call the clinic.   BELOW ARE SYMPTOMS THAT SHOULD BE REPORTED IMMEDIATELY:  *FEVER GREATER THAN 100.5 F  *CHILLS WITH OR WITHOUT FEVER  NAUSEA AND VOMITING THAT IS NOT CONTROLLED WITH YOUR NAUSEA MEDICATION  *UNUSUAL SHORTNESS OF BREATH  *UNUSUAL BRUISING OR BLEEDING  TENDERNESS IN MOUTH AND THROAT WITH OR WITHOUT PRESENCE OF ULCERS  *URINARY PROBLEMS  *BOWEL PROBLEMS  UNUSUAL RASH Items with * indicate a potential emergency and should be followed up as soon as possible.  Feel free to call the clinic you have any questions or concerns. The clinic phone number is [phone].  Please show the CHEMO ALERT CARD at check-in to the Emergency Department and triage nurse.   

## 2015-12-18 ENCOUNTER — Ambulatory Visit: Payer: Commercial Managed Care - HMO | Admitting: Internal Medicine

## 2015-12-18 ENCOUNTER — Ambulatory Visit (HOSPITAL_BASED_OUTPATIENT_CLINIC_OR_DEPARTMENT_OTHER): Payer: Commercial Managed Care - HMO | Admitting: Internal Medicine

## 2015-12-18 ENCOUNTER — Ambulatory Visit: Payer: Commercial Managed Care - HMO

## 2015-12-18 ENCOUNTER — Other Ambulatory Visit (HOSPITAL_BASED_OUTPATIENT_CLINIC_OR_DEPARTMENT_OTHER): Payer: Commercial Managed Care - HMO

## 2015-12-18 ENCOUNTER — Encounter: Payer: Self-pay | Admitting: Internal Medicine

## 2015-12-18 ENCOUNTER — Other Ambulatory Visit: Payer: Commercial Managed Care - HMO

## 2015-12-18 ENCOUNTER — Ambulatory Visit (HOSPITAL_BASED_OUTPATIENT_CLINIC_OR_DEPARTMENT_OTHER): Payer: Commercial Managed Care - HMO

## 2015-12-18 VITALS — BP 158/83 | HR 68 | Temp 98.5°F | Resp 18 | Ht 66.0 in | Wt 167.3 lb

## 2015-12-18 DIAGNOSIS — I10 Essential (primary) hypertension: Secondary | ICD-10-CM

## 2015-12-18 DIAGNOSIS — F419 Anxiety disorder, unspecified: Secondary | ICD-10-CM

## 2015-12-18 DIAGNOSIS — Z5112 Encounter for antineoplastic immunotherapy: Secondary | ICD-10-CM

## 2015-12-18 DIAGNOSIS — D595 Paroxysmal nocturnal hemoglobinuria [Marchiafava-Micheli]: Secondary | ICD-10-CM

## 2015-12-18 LAB — COMPREHENSIVE METABOLIC PANEL
ALK PHOS: 68 U/L (ref 40–150)
ALT: 42 U/L (ref 0–55)
AST: 36 U/L — AB (ref 5–34)
Albumin: 4.4 g/dL (ref 3.5–5.0)
Anion Gap: 11 mEq/L (ref 3–11)
BILIRUBIN TOTAL: 3.11 mg/dL — AB (ref 0.20–1.20)
BUN: 11.5 mg/dL (ref 7.0–26.0)
CHLORIDE: 106 meq/L (ref 98–109)
CO2: 23 meq/L (ref 22–29)
Calcium: 9.2 mg/dL (ref 8.4–10.4)
Creatinine: 0.9 mg/dL (ref 0.7–1.3)
Glucose: 95 mg/dl (ref 70–140)
POTASSIUM: 4 meq/L (ref 3.5–5.1)
SODIUM: 139 meq/L (ref 136–145)
Total Protein: 7.4 g/dL (ref 6.4–8.3)

## 2015-12-18 LAB — CBC WITH DIFFERENTIAL/PLATELET
BASO%: 0.7 % (ref 0.0–2.0)
BASOS ABS: 0 10*3/uL (ref 0.0–0.1)
EOS ABS: 0.1 10*3/uL (ref 0.0–0.5)
EOS%: 2.1 % (ref 0.0–7.0)
HCT: 42.3 % (ref 38.4–49.9)
HGB: 14.1 g/dL (ref 13.0–17.1)
LYMPH%: 20.8 % (ref 14.0–49.0)
MCH: 36.5 pg — AB (ref 27.2–33.4)
MCHC: 33.3 g/dL (ref 32.0–36.0)
MCV: 109.5 fL — AB (ref 79.3–98.0)
MONO#: 0.5 10*3/uL (ref 0.1–0.9)
MONO%: 8.1 % (ref 0.0–14.0)
NEUT#: 4.1 10*3/uL (ref 1.5–6.5)
NEUT%: 68.3 % (ref 39.0–75.0)
Platelets: 134 10*3/uL — ABNORMAL LOW (ref 140–400)
RBC: 3.86 10*6/uL — AB (ref 4.20–5.82)
RDW: 15.9 % — AB (ref 11.0–14.6)
WBC: 6 10*3/uL (ref 4.0–10.3)
lymph#: 1.2 10*3/uL (ref 0.9–3.3)

## 2015-12-18 LAB — LACTATE DEHYDROGENASE: LDH: 261 U/L — ABNORMAL HIGH (ref 125–245)

## 2015-12-18 MED ORDER — ECULIZUMAB 300 MG/30ML IV SOLN
900.0000 mg | Freq: Once | INTRAVENOUS | Status: AC
  Administered 2015-12-18: 900 mg via INTRAVENOUS
  Filled 2015-12-18: qty 90

## 2015-12-18 MED ORDER — LORAZEPAM 1 MG PO TABS
1.0000 mg | ORAL_TABLET | Freq: Once | ORAL | Status: DC
  Administered 2015-12-18: 1 mg via ORAL

## 2015-12-18 MED ORDER — LORAZEPAM 1 MG PO TABS
ORAL_TABLET | ORAL | Status: AC
  Filled 2015-12-18: qty 1

## 2015-12-18 MED ORDER — SODIUM CHLORIDE 0.9 % IV SOLN
Freq: Once | INTRAVENOUS | Status: AC
  Administered 2015-12-18: 13:00:00 via INTRAVENOUS

## 2015-12-18 NOTE — Patient Instructions (Signed)
[facility] Discharge Instructions for Patients Receiving Chemotherapy  Today you received the following chemotherapy agents :  Soliris.  To help prevent nausea and vomiting after your treatment, we encourage you to take your nausea medication as prescribed.   If you develop nausea and vomiting that is not controlled by your nausea medication, call the clinic.   BELOW ARE SYMPTOMS THAT SHOULD BE REPORTED IMMEDIATELY:  *FEVER GREATER THAN 100.5 F  *CHILLS WITH OR WITHOUT FEVER  NAUSEA AND VOMITING THAT IS NOT CONTROLLED WITH YOUR NAUSEA MEDICATION  *UNUSUAL SHORTNESS OF BREATH  *UNUSUAL BRUISING OR BLEEDING  TENDERNESS IN MOUTH AND THROAT WITH OR WITHOUT PRESENCE OF ULCERS  *URINARY PROBLEMS  *BOWEL PROBLEMS  UNUSUAL RASH Items with * indicate a potential emergency and should be followed up as soon as possible.  Feel free to call the clinic you have any questions or concerns. The clinic phone number is [phone].  Please show the CHEMO ALERT CARD at check-in to the Emergency Department and triage nurse.   

## 2015-12-18 NOTE — Progress Notes (Signed)
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   PRIOR THERAPY:  1) Tapered dose of prednisone. 2) High dose of IVIG   CURRENT THERAPY: Soliris (Eculizumab) induction with 600 mg IV weekly 4 weeks followed by maintenance treatment at a dose of 900 mg IV every 2 weeks. First dose of treatment is expected on 11/21/2014. Status post 14 cycles. He is here today to start day 1 of cycle #15.  INTERVAL HISTORY: Travis Mcdaniel 45 y.o. male returns to the clinic today for follow-up visit. He is tolerating his treatment with Soliris fairly well with no significant adverse effects. Unfortunately he lost his daughter who was 40 years old this weekend, probably secondary to drug abuse and overdose. He is still grieving. He denied having any significant fever or chills. He has no nausea or vomiting. He denied having any significant chest pain, shortness of breath, cough or hemoptysis. He has no significant weight loss or night sweats. He continues to drink alcohol a few times a week.  He is here today to start cycle #15 of his treatment with Soliris.  MEDICAL HISTORY: Past Medical History  Diagnosis Date  . Diverticulosis     ALLERGIES:  has No Known Allergies.  MEDICATIONS:  Current Outpatient Prescriptions  Medication Sig Dispense Refill  . ALPRAZolam (XANAX) 1 MG tablet Take 1 mg by mouth 3 (three) times daily.  2  . hydrochlorothiazide (HYDRODIURIL) 25 MG tablet     . Multiple Vitamin (MULTIVITAMIN WITH MINERALS) TABS tablet Take 1 tablet by mouth daily.    . Psyllium (METAMUCIL PO) Take 1 capsule by mouth daily.     No current facility-administered medications for this visit.    SURGICAL HISTORY: History reviewed. No pertinent past surgical history.  REVIEW OF  SYSTEMS:  A comprehensive review of systems was negative.   PHYSICAL EXAMINATION: General appearance: alert, cooperative and no distress Head: Normocephalic, without obvious abnormality, atraumatic Neck: no adenopathy, no JVD, supple, symmetrical, trachea midline and thyroid not enlarged, symmetric, no tenderness/mass/nodules Lymph nodes: Cervical, supraclavicular, and axillary nodes normal. Resp: clear to auscultation bilaterally Back: symmetric, no curvature. ROM normal. No CVA tenderness. Cardio: regular rate and rhythm, S1, S2 normal, no murmur, click, rub or gallop GI: soft, non-tender; bowel sounds normal; no masses,  no organomegaly Extremities: extremities normal, atraumatic, no cyanosis or edema Neurologic: Alert and oriented X 3, normal strength and tone. Normal symmetric reflexes. Normal coordination and gait  ECOG PERFORMANCE STATUS: 1 - Symptomatic but completely ambulatory  Blood pressure 158/83, pulse 68, temperature 98.5 F (36.9 C), temperature source Oral, resp. rate 18, height 5\' 6"  (1.676 m), weight 167 lb 4.8 oz (75.887 kg), SpO2 100 %.  LABORATORY DATA: Lab Results  Component Value Date   WBC 6.0 12/18/2015   HGB 14.1 12/18/2015   HCT 42.3 12/18/2015   MCV 109.5* 12/18/2015   PLT 134* 12/18/2015      Chemistry      Component Value Date/Time   NA 139 12/18/2015 1110   NA 140 09/07/2014 0407   K 4.0 12/18/2015 1110   K 3.9 09/07/2014 0407   CL 110 09/07/2014 0407   CO2 23 12/18/2015 1110   CO2 22 09/07/2014 0407   BUN 11.5 12/18/2015 1110   BUN 12 09/07/2014 0407   CREATININE 0.9 12/18/2015  1110   CREATININE 0.99 09/07/2014 0407      Component Value Date/Time   CALCIUM 9.2 12/18/2015 1110   CALCIUM 8.3* 09/07/2014 0407   ALKPHOS 68 12/18/2015 1110   ALKPHOS 30* 09/07/2014 0407   AST 36* 12/18/2015 1110   AST 120* 09/07/2014 0407   ALT 42 12/18/2015 1110   ALT 27 09/07/2014 0407   BILITOT 3.11* 12/18/2015 1110   BILITOT 0.7 09/07/2014 0407        RADIOGRAPHIC STUDIES: No results found.  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 He is currently on treatment with Soliris. He is status post 14 cycles of treatment.  The patient is tolerating his treatment fairly well. I recommended for him to continue his treatment as a scheduled.  He will start cycle #15 today.He would come back for follow-up visit in 4 weeks for reevaluation before starting cycle #16.  The patient was also advised to take daily aspirin during his treatment with Soliris. For the hyperbilirubinemia, this is stable and we will continue to monitor it closely. For hypertension, he is currently on hydrochlorothiazide managed by Dr. [name]. For the anxiety, I will give him a dose of Ativan 1.0 mg by mouth 1 today. The patient was advised to call immediately if he has any concerning symptoms in the interval. The patient voices understanding of current disease status and treatment options and is in agreement with the current care plan.  All questions were answered. The patient knows to call the clinic with any problems, questions or concerns. We can certainly see the patient much sooner if necessary.  Disclaimer: This note was dictated with voice recognition software. Similar sounding words can inadvertently be transcribed and may not be corrected upon review.

## 2016-01-01 ENCOUNTER — Ambulatory Visit (HOSPITAL_BASED_OUTPATIENT_CLINIC_OR_DEPARTMENT_OTHER): Payer: Commercial Managed Care - HMO

## 2016-01-01 ENCOUNTER — Telehealth: Payer: Self-pay | Admitting: Internal Medicine

## 2016-01-01 ENCOUNTER — Other Ambulatory Visit (HOSPITAL_BASED_OUTPATIENT_CLINIC_OR_DEPARTMENT_OTHER): Payer: Commercial Managed Care - HMO

## 2016-01-01 VITALS — BP 131/73 | HR 72 | Temp 98.9°F | Resp 18

## 2016-01-01 DIAGNOSIS — D595 Paroxysmal nocturnal hemoglobinuria [Marchiafava-Micheli]: Secondary | ICD-10-CM

## 2016-01-01 DIAGNOSIS — Z5112 Encounter for antineoplastic immunotherapy: Secondary | ICD-10-CM | POA: Diagnosis not present

## 2016-01-01 LAB — COMPREHENSIVE METABOLIC PANEL
ALBUMIN: 4.1 g/dL (ref 3.5–5.0)
ALT: 47 U/L (ref 0–55)
AST: 32 U/L (ref 5–34)
Alkaline Phosphatase: 66 U/L (ref 40–150)
Anion Gap: 11 mEq/L (ref 3–11)
BILIRUBIN TOTAL: 2.98 mg/dL — AB (ref 0.20–1.20)
BUN: 15.4 mg/dL (ref 7.0–26.0)
CO2: 23 meq/L (ref 22–29)
CREATININE: 0.9 mg/dL (ref 0.7–1.3)
Calcium: 9 mg/dL (ref 8.4–10.4)
Chloride: 105 mEq/L (ref 98–109)
EGFR: >90 mL/min/{1.73_m2} (ref >90)
GLUCOSE: 88 mg/dL (ref 70–140)
Potassium: 3.7 mEq/L (ref 3.5–5.1)
SODIUM: 139 meq/L (ref 136–145)
TOTAL PROTEIN: 6.9 g/dL (ref 6.4–8.3)

## 2016-01-01 LAB — CBC WITH DIFFERENTIAL/PLATELET
BASO%: 0.4 % (ref 0.0–2.0)
Basophils Absolute: 0 10*3/uL (ref 0.0–0.1)
EOS%: 4.3 % (ref 0.0–7.0)
Eosinophils Absolute: 0.2 10*3/uL (ref 0.0–0.5)
HEMATOCRIT: 35.4 % — AB (ref 38.4–49.9)
HGB: 12 g/dL — ABNORMAL LOW (ref 13.0–17.1)
LYMPH#: 1.4 10*3/uL (ref 0.9–3.3)
LYMPH%: 26.8 % (ref 14.0–49.0)
MCH: 36.9 pg — AB (ref 27.2–33.4)
MCHC: 33.9 g/dL (ref 32.0–36.0)
MCV: 108.9 fL — ABNORMAL HIGH (ref 79.3–98.0)
MONO#: 0.6 10*3/uL (ref 0.1–0.9)
MONO%: 10.9 % (ref 0.0–14.0)
NEUT#: 3 10*3/uL (ref 1.5–6.5)
NEUT%: 57.6 % (ref 39.0–75.0)
Platelets: 124 10*3/uL — ABNORMAL LOW (ref 140–400)
RBC: 3.25 10*6/uL — ABNORMAL LOW (ref 4.20–5.82)
RDW: 15.1 % — AB (ref 11.0–14.6)
WBC: 5.2 10*3/uL (ref 4.0–10.3)

## 2016-01-01 LAB — LACTATE DEHYDROGENASE: LDH: 251 U/L — ABNORMAL HIGH (ref 125–245)

## 2016-01-01 MED ORDER — SODIUM CHLORIDE 0.9 % IV SOLN
900.0000 mg | Freq: Once | INTRAVENOUS | Status: AC
  Administered 2016-01-01: 900 mg via INTRAVENOUS
  Filled 2016-01-01: qty 90

## 2016-01-01 MED ORDER — SODIUM CHLORIDE 0.9 % IV SOLN
Freq: Once | INTRAVENOUS | Status: AC
  Administered 2016-01-01: 11:00:00 via INTRAVENOUS

## 2016-01-01 NOTE — Progress Notes (Signed)
Pt refused to stay for 1 hour observation period following infusion.

## 2016-01-01 NOTE — Patient Instructions (Signed)
[facility] Discharge Instructions for Patients Receiving Chemotherapy  Today you received the following chemotherapy agents Soliris  To help prevent nausea and vomiting after your treatment, we encourage you to take your nausea medication.   If you develop nausea and vomiting that is not controlled by your nausea medication, call the clinic.   BELOW ARE SYMPTOMS THAT SHOULD BE REPORTED IMMEDIATELY:  *FEVER GREATER THAN 100.5 F  *CHILLS WITH OR WITHOUT FEVER  NAUSEA AND VOMITING THAT IS NOT CONTROLLED WITH YOUR NAUSEA MEDICATION  *UNUSUAL SHORTNESS OF BREATH  *UNUSUAL BRUISING OR BLEEDING  TENDERNESS IN MOUTH AND THROAT WITH OR WITHOUT PRESENCE OF ULCERS  *URINARY PROBLEMS  *BOWEL PROBLEMS  UNUSUAL RASH Items with * indicate a potential emergency and should be followed up as soon as possible.  Feel free to call the clinic you have any questions or concerns. The clinic phone number is [phone].  Please show the CHEMO ALERT CARD at check-in to the Emergency Department and triage nurse.   

## 2016-01-01 NOTE — Telephone Encounter (Signed)
Printed new sched..the patient only see MD every 4weeks

## 2016-01-15 ENCOUNTER — Other Ambulatory Visit (HOSPITAL_BASED_OUTPATIENT_CLINIC_OR_DEPARTMENT_OTHER): Payer: Commercial Managed Care - HMO

## 2016-01-15 ENCOUNTER — Ambulatory Visit (HOSPITAL_BASED_OUTPATIENT_CLINIC_OR_DEPARTMENT_OTHER): Payer: Commercial Managed Care - HMO | Admitting: Internal Medicine

## 2016-01-15 ENCOUNTER — Encounter: Payer: Self-pay | Admitting: Internal Medicine

## 2016-01-15 ENCOUNTER — Ambulatory Visit (HOSPITAL_BASED_OUTPATIENT_CLINIC_OR_DEPARTMENT_OTHER): Payer: Commercial Managed Care - HMO

## 2016-01-15 VITALS — BP 148/72 | HR 74 | Temp 98.4°F | Resp 18 | Ht 66.0 in | Wt 170.8 lb

## 2016-01-15 DIAGNOSIS — I10 Essential (primary) hypertension: Secondary | ICD-10-CM | POA: Diagnosis not present

## 2016-01-15 DIAGNOSIS — D595 Paroxysmal nocturnal hemoglobinuria [Marchiafava-Micheli]: Secondary | ICD-10-CM

## 2016-01-15 DIAGNOSIS — Z5111 Encounter for antineoplastic chemotherapy: Secondary | ICD-10-CM | POA: Insufficient documentation

## 2016-01-15 DIAGNOSIS — Z5112 Encounter for antineoplastic immunotherapy: Secondary | ICD-10-CM | POA: Diagnosis not present

## 2016-01-15 HISTORY — DX: Encounter for antineoplastic chemotherapy: Z51.11

## 2016-01-15 LAB — CBC WITH DIFFERENTIAL/PLATELET
BASO%: 0.8 % (ref 0.0–2.0)
BASOS ABS: 0 10*3/uL (ref 0.0–0.1)
EOS%: 5 % (ref 0.0–7.0)
Eosinophils Absolute: 0.3 10*3/uL (ref 0.0–0.5)
HEMATOCRIT: 35.5 % — AB (ref 38.4–49.9)
HGB: 12 g/dL — ABNORMAL LOW (ref 13.0–17.1)
LYMPH#: 1.2 10*3/uL (ref 0.9–3.3)
LYMPH%: 22.3 % (ref 14.0–49.0)
MCH: 38 pg — AB (ref 27.2–33.4)
MCHC: 33.9 g/dL (ref 32.0–36.0)
MCV: 112.4 fL — ABNORMAL HIGH (ref 79.3–98.0)
MONO#: 0.4 10*3/uL (ref 0.1–0.9)
MONO%: 8.2 % (ref 0.0–14.0)
NEUT#: 3.3 10*3/uL (ref 1.5–6.5)
NEUT%: 63.7 % (ref 39.0–75.0)
Platelets: 131 10*3/uL — ABNORMAL LOW (ref 140–400)
RBC: 3.16 10*6/uL — AB (ref 4.20–5.82)
RDW: 16.6 % — ABNORMAL HIGH (ref 11.0–14.6)
WBC: 5.2 10*3/uL (ref 4.0–10.3)

## 2016-01-15 LAB — COMPREHENSIVE METABOLIC PANEL
ALT: 43 U/L (ref 0–55)
AST: 38 U/L — AB (ref 5–34)
Albumin: 4.1 g/dL (ref 3.5–5.0)
Alkaline Phosphatase: 71 U/L (ref 40–150)
Anion Gap: 11 mEq/L (ref 3–11)
BUN: 12.1 mg/dL (ref 7.0–26.0)
CALCIUM: 9 mg/dL (ref 8.4–10.4)
CHLORIDE: 104 meq/L (ref 98–109)
CO2: 23 meq/L (ref 22–29)
CREATININE: 0.8 mg/dL (ref 0.7–1.3)
EGFR: >90 mL/min/{1.73_m2} (ref >90)
GLUCOSE: 91 mg/dL (ref 70–140)
POTASSIUM: 3.7 meq/L (ref 3.5–5.1)
Sodium: 138 mEq/L (ref 136–145)
Total Bilirubin: 2.51 mg/dL — ABNORMAL HIGH (ref 0.20–1.20)
Total Protein: 7 g/dL (ref 6.4–8.3)

## 2016-01-15 LAB — LACTATE DEHYDROGENASE: LDH: 273 U/L — AB (ref 125–245)

## 2016-01-15 MED ORDER — SODIUM CHLORIDE 0.9 % IV SOLN
Freq: Once | INTRAVENOUS | Status: AC
  Administered 2016-01-15: 13:00:00 via INTRAVENOUS

## 2016-01-15 MED ORDER — SODIUM CHLORIDE 0.9 % IV SOLN
900.0000 mg | Freq: Once | INTRAVENOUS | Status: AC
  Administered 2016-01-15: 900 mg via INTRAVENOUS
  Filled 2016-01-15: qty 90

## 2016-01-15 NOTE — Progress Notes (Signed)
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   PRIOR THERAPY:  1) Tapered dose of prednisone. 2) High dose of IVIG   CURRENT THERAPY: Soliris (Eculizumab) induction with 600 mg IV weekly 4 weeks followed by maintenance treatment at a dose of 900 mg IV every 2 weeks. First dose of treatment is expected on 11/21/2014. Status post 15 cycles. He is here today to start day 1 of cycle #15.  INTERVAL HISTORY: Travis Mcdaniel 45 y.o. male returns to the clinic today for follow-up visit. He is tolerating his treatment with Soliris status post 15 cycles fairly well with no significant adverse effects. He denied having any significant fever or chills. He has no nausea or vomiting. He denied having any significant chest pain, shortness of breath, cough or hemoptysis. He has no significant weight loss or night sweats. He is here today to start cycle #16 of his treatment with Soliris.  MEDICAL HISTORY: Past Medical History  Diagnosis Date  . Diverticulosis     ALLERGIES:  has No Known Allergies.  MEDICATIONS:  Current Outpatient Prescriptions  Medication Sig Dispense Refill  . ALPRAZolam (XANAX) 1 MG tablet Take 1 mg by mouth 3 (three) times daily.  2  . hydrochlorothiazide (HYDRODIURIL) 25 MG tablet     . Multiple Vitamin (MULTIVITAMIN WITH MINERALS) TABS tablet Take 1 tablet by mouth daily.    . Psyllium (METAMUCIL PO) Take 1 capsule by mouth daily.     No current facility-administered medications for this visit.    SURGICAL HISTORY: History reviewed. No pertinent past surgical history.  REVIEW OF SYSTEMS:  A comprehensive review of systems was negative.   PHYSICAL EXAMINATION: General appearance: alert, cooperative and no distress Head: Normocephalic, without  obvious abnormality, atraumatic Neck: no adenopathy, no JVD, supple, symmetrical, trachea midline and thyroid not enlarged, symmetric, no tenderness/mass/nodules Lymph nodes: Cervical, supraclavicular, and axillary nodes normal. Resp: clear to auscultation bilaterally Back: symmetric, no curvature. ROM normal. No CVA tenderness. Cardio: regular rate and rhythm, S1, S2 normal, no murmur, click, rub or gallop GI: soft, non-tender; bowel sounds normal; no masses,  no organomegaly Extremities: extremities normal, atraumatic, no cyanosis or edema Neurologic: Alert and oriented X 3, normal strength and tone. Normal symmetric reflexes. Normal coordination and gait  ECOG PERFORMANCE STATUS: 1 - Symptomatic but completely ambulatory  Blood pressure 148/72, pulse 74, temperature 98.4 F (36.9 C), temperature source Oral, resp. rate 18, height 5\' 6"  (1.676 m), weight 170 lb 12.8 oz (77.474 kg), SpO2 100 %.  LABORATORY DATA: Lab Results  Component Value Date   WBC 5.2 01/15/2016   HGB 12.0* 01/15/2016   HCT 35.5* 01/15/2016   MCV 112.4* 01/15/2016   PLT 131* 01/15/2016      Chemistry      Component Value Date/Time   NA 138 01/15/2016 1107   NA 140 09/07/2014 0407   K 3.7 01/15/2016 1107   K 3.9 09/07/2014 0407   CL 110 09/07/2014 0407   CO2 23 01/15/2016 1107   CO2 22 09/07/2014 0407   BUN 12.1 01/15/2016 1107   BUN 12 09/07/2014 0407   CREATININE 0.8 01/15/2016 1107   CREATININE 0.99 09/07/2014 0407      Component Value Date/Time   CALCIUM 9.0 01/15/2016 1107   CALCIUM 8.3* 09/07/2014 0407   ALKPHOS  71 01/15/2016 1107   ALKPHOS 30* 09/07/2014 0407   AST 38* 01/15/2016 1107   AST 120* 09/07/2014 0407   ALT 43 01/15/2016 1107   ALT 27 09/07/2014 0407   BILITOT 2.51* 01/15/2016 1107   BILITOT 0.7 09/07/2014 0407       RADIOGRAPHIC STUDIES: No results found.  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 He is currently on treatment with Soliris. He is status post 15 cycles of treatment.  The patient is tolerating his treatment fairly well. I recommended for him to continue his treatment as a scheduled.  He will start cycle #16 today.He would come back for follow-up visit in 4 weeks for reevaluation before starting cycle #17.  The patient was also advised to take daily aspirin during his treatment with Soliris. For the hyperbilirubinemia, this is stable and we will continue to monitor it closely. For hypertension, he is currently on hydrochlorothiazide managed by Dr. [name]. The patient was advised to call immediately if he has any concerning symptoms in the interval. The patient voices understanding of current disease status and treatment options and is in agreement with the current care plan.  All questions were answered. The patient knows to call the clinic with any problems, questions or concerns. We can certainly see the patient much sooner if necessary.  Disclaimer: This note was dictated with voice recognition software. Similar sounding words can inadvertently be transcribed and may not be corrected upon review.

## 2016-01-15 NOTE — Progress Notes (Signed)
Patient refused to wait for one hour observation period.

## 2016-01-15 NOTE — Patient Instructions (Signed)
[facility] Discharge Instructions for Patients Receiving Chemotherapy  Today you received the following chemotherapy agents Soliris  To help prevent nausea and vomiting after your treatment, we encourage you to take your nausea medication.   If you develop nausea and vomiting that is not controlled by your nausea medication, call the clinic.   BELOW ARE SYMPTOMS THAT SHOULD BE REPORTED IMMEDIATELY:  *FEVER GREATER THAN 100.5 F  *CHILLS WITH OR WITHOUT FEVER  NAUSEA AND VOMITING THAT IS NOT CONTROLLED WITH YOUR NAUSEA MEDICATION  *UNUSUAL SHORTNESS OF BREATH  *UNUSUAL BRUISING OR BLEEDING  TENDERNESS IN MOUTH AND THROAT WITH OR WITHOUT PRESENCE OF ULCERS  *URINARY PROBLEMS  *BOWEL PROBLEMS  UNUSUAL RASH Items with * indicate a potential emergency and should be followed up as soon as possible.  Feel free to call the clinic you have any questions or concerns. The clinic phone number is [phone].  Please show the CHEMO ALERT CARD at check-in to the Emergency Department and triage nurse.   

## 2016-01-17 ENCOUNTER — Telehealth: Payer: Self-pay | Admitting: Internal Medicine

## 2016-01-17 NOTE — Telephone Encounter (Signed)
per pof to sch pt appt-pt to get updated copy on 8/2

## 2016-01-29 ENCOUNTER — Ambulatory Visit (HOSPITAL_BASED_OUTPATIENT_CLINIC_OR_DEPARTMENT_OTHER): Payer: Commercial Managed Care - HMO

## 2016-01-29 ENCOUNTER — Other Ambulatory Visit (HOSPITAL_BASED_OUTPATIENT_CLINIC_OR_DEPARTMENT_OTHER): Payer: Commercial Managed Care - HMO

## 2016-01-29 VITALS — BP 124/70 | HR 59 | Temp 98.1°F | Resp 16

## 2016-01-29 DIAGNOSIS — Z5112 Encounter for antineoplastic immunotherapy: Secondary | ICD-10-CM | POA: Diagnosis not present

## 2016-01-29 DIAGNOSIS — D595 Paroxysmal nocturnal hemoglobinuria [Marchiafava-Micheli]: Secondary | ICD-10-CM

## 2016-01-29 LAB — CBC WITH DIFFERENTIAL/PLATELET
BASO%: 0.9 % (ref 0.0–2.0)
BASOS ABS: 0 10*3/uL (ref 0.0–0.1)
EOS%: 5.2 % (ref 0.0–7.0)
Eosinophils Absolute: 0.3 10*3/uL (ref 0.0–0.5)
HEMATOCRIT: 38.3 % — AB (ref 38.4–49.9)
HGB: 12.7 g/dL — ABNORMAL LOW (ref 13.0–17.1)
LYMPH#: 1.1 10*3/uL (ref 0.9–3.3)
LYMPH%: 21.1 % (ref 14.0–49.0)
MCH: 37.5 pg — AB (ref 27.2–33.4)
MCHC: 33.2 g/dL (ref 32.0–36.0)
MCV: 112.8 fL — AB (ref 79.3–98.0)
MONO#: 0.4 10*3/uL (ref 0.1–0.9)
MONO%: 8.2 % (ref 0.0–14.0)
NEUT#: 3.2 10*3/uL (ref 1.5–6.5)
NEUT%: 64.6 % (ref 39.0–75.0)
PLATELETS: 128 10*3/uL — AB (ref 140–400)
RBC: 3.39 10*6/uL — ABNORMAL LOW (ref 4.20–5.82)
RDW: 15.4 % — ABNORMAL HIGH (ref 11.0–14.6)
WBC: 5 10*3/uL (ref 4.0–10.3)

## 2016-01-29 LAB — COMPREHENSIVE METABOLIC PANEL
ALT: 36 U/L (ref 0–55)
AST: 27 U/L (ref 5–34)
Albumin: 4.1 g/dL (ref 3.5–5.0)
Alkaline Phosphatase: 66 U/L (ref 40–150)
Anion Gap: 9 mEq/L (ref 3–11)
BUN: 11 mg/dL (ref 7.0–26.0)
CALCIUM: 9.2 mg/dL (ref 8.4–10.4)
CHLORIDE: 109 meq/L (ref 98–109)
CO2: 22 meq/L (ref 22–29)
CREATININE: 0.8 mg/dL (ref 0.7–1.3)
EGFR: >90 mL/min/{1.73_m2} (ref >90)
GLUCOSE: 88 mg/dL (ref 70–140)
Potassium: 4 mEq/L (ref 3.5–5.1)
Sodium: 140 mEq/L (ref 136–145)
Total Bilirubin: 2.25 mg/dL — ABNORMAL HIGH (ref 0.20–1.20)
Total Protein: 6.9 g/dL (ref 6.4–8.3)

## 2016-01-29 LAB — LACTATE DEHYDROGENASE: LDH: 235 U/L (ref 125–245)

## 2016-01-29 MED ORDER — SODIUM CHLORIDE 0.9 % IV SOLN
Freq: Once | INTRAVENOUS | Status: AC
  Administered 2016-01-29: 09:00:00 via INTRAVENOUS

## 2016-01-29 MED ORDER — ECULIZUMAB 300 MG/30ML IV SOLN
900.0000 mg | Freq: Once | INTRAVENOUS | Status: AC
  Administered 2016-01-29: 900 mg via INTRAVENOUS
  Filled 2016-01-29: qty 90

## 2016-01-29 NOTE — Patient Instructions (Signed)
[facility] Discharge Instructions for Patients Receiving Chemotherapy  Today you received the following chemotherapy agents Soliris  To help prevent nausea and vomiting after your treatment, we encourage you to take your nausea medication.   If you develop nausea and vomiting that is not controlled by your nausea medication, call the clinic.   BELOW ARE SYMPTOMS THAT SHOULD BE REPORTED IMMEDIATELY:  *FEVER GREATER THAN 100.5 F  *CHILLS WITH OR WITHOUT FEVER  NAUSEA AND VOMITING THAT IS NOT CONTROLLED WITH YOUR NAUSEA MEDICATION  *UNUSUAL SHORTNESS OF BREATH  *UNUSUAL BRUISING OR BLEEDING  TENDERNESS IN MOUTH AND THROAT WITH OR WITHOUT PRESENCE OF ULCERS  *URINARY PROBLEMS  *BOWEL PROBLEMS  UNUSUAL RASH Items with * indicate a potential emergency and should be followed up as soon as possible.  Feel free to call the clinic you have any questions or concerns. The clinic phone number is [phone].  Please show the CHEMO ALERT CARD at check-in to the Emergency Department and triage nurse.   

## 2016-01-30 ENCOUNTER — Other Ambulatory Visit: Payer: Commercial Managed Care - HMO

## 2016-01-30 ENCOUNTER — Ambulatory Visit: Payer: Commercial Managed Care - HMO | Admitting: Internal Medicine

## 2016-01-30 ENCOUNTER — Ambulatory Visit: Payer: Commercial Managed Care - HMO

## 2016-02-12 ENCOUNTER — Ambulatory Visit (HOSPITAL_BASED_OUTPATIENT_CLINIC_OR_DEPARTMENT_OTHER): Payer: Commercial Managed Care - HMO

## 2016-02-12 ENCOUNTER — Ambulatory Visit (HOSPITAL_BASED_OUTPATIENT_CLINIC_OR_DEPARTMENT_OTHER): Payer: Commercial Managed Care - HMO | Admitting: Internal Medicine

## 2016-02-12 ENCOUNTER — Encounter: Payer: Self-pay | Admitting: Internal Medicine

## 2016-02-12 ENCOUNTER — Other Ambulatory Visit (HOSPITAL_BASED_OUTPATIENT_CLINIC_OR_DEPARTMENT_OTHER): Payer: Commercial Managed Care - HMO

## 2016-02-12 VITALS — BP 131/76 | HR 69 | Temp 98.6°F | Resp 18 | Ht 66.0 in | Wt 170.7 lb

## 2016-02-12 DIAGNOSIS — I10 Essential (primary) hypertension: Secondary | ICD-10-CM | POA: Diagnosis not present

## 2016-02-12 DIAGNOSIS — Z5111 Encounter for antineoplastic chemotherapy: Secondary | ICD-10-CM

## 2016-02-12 DIAGNOSIS — D595 Paroxysmal nocturnal hemoglobinuria [Marchiafava-Micheli]: Secondary | ICD-10-CM

## 2016-02-12 DIAGNOSIS — Z5112 Encounter for antineoplastic immunotherapy: Secondary | ICD-10-CM | POA: Diagnosis not present

## 2016-02-12 LAB — CBC WITH DIFFERENTIAL/PLATELET
BASO%: 0.4 % (ref 0.0–2.0)
BASOS ABS: 0 10*3/uL (ref 0.0–0.1)
EOS ABS: 0.3 10*3/uL (ref 0.0–0.5)
EOS%: 5.6 % (ref 0.0–7.0)
HCT: 37.6 % — ABNORMAL LOW (ref 38.4–49.9)
HGB: 12.7 g/dL — ABNORMAL LOW (ref 13.0–17.1)
LYMPH%: 26.1 % (ref 14.0–49.0)
MCH: 37.1 pg — AB (ref 27.2–33.4)
MCHC: 33.8 g/dL (ref 32.0–36.0)
MCV: 109.9 fL — AB (ref 79.3–98.0)
MONO#: 0.5 10*3/uL (ref 0.1–0.9)
MONO%: 8.8 % (ref 0.0–14.0)
NEUT#: 3.1 10*3/uL (ref 1.5–6.5)
NEUT%: 59.1 % (ref 39.0–75.0)
Platelets: 130 10*3/uL — ABNORMAL LOW (ref 140–400)
RBC: 3.42 10*6/uL — AB (ref 4.20–5.82)
RDW: 15 % — ABNORMAL HIGH (ref 11.0–14.6)
WBC: 5.2 10*3/uL (ref 4.0–10.3)
lymph#: 1.4 10*3/uL (ref 0.9–3.3)

## 2016-02-12 LAB — COMPREHENSIVE METABOLIC PANEL
ALK PHOS: 73 U/L (ref 40–150)
ALT: 39 U/L (ref 0–55)
AST: 37 U/L — ABNORMAL HIGH (ref 5–34)
Albumin: 4.1 g/dL (ref 3.5–5.0)
Anion Gap: 10 mEq/L (ref 3–11)
BILIRUBIN TOTAL: 2.62 mg/dL — AB (ref 0.20–1.20)
BUN: 10.8 mg/dL (ref 7.0–26.0)
CO2: 23 meq/L (ref 22–29)
Calcium: 9.3 mg/dL (ref 8.4–10.4)
Chloride: 105 mEq/L (ref 98–109)
Creatinine: 0.8 mg/dL (ref 0.7–1.3)
GLUCOSE: 94 mg/dL (ref 70–140)
POTASSIUM: 3.7 meq/L (ref 3.5–5.1)
SODIUM: 138 meq/L (ref 136–145)
Total Protein: 7.1 g/dL (ref 6.4–8.3)

## 2016-02-12 LAB — LACTATE DEHYDROGENASE: LDH: 231 U/L (ref 125–245)

## 2016-02-12 MED ORDER — SODIUM CHLORIDE 0.9 % IV SOLN
Freq: Once | INTRAVENOUS | Status: AC
  Administered 2016-02-12: 14:00:00 via INTRAVENOUS

## 2016-02-12 MED ORDER — SODIUM CHLORIDE 0.9 % IV SOLN
900.0000 mg | Freq: Once | INTRAVENOUS | Status: AC
  Administered 2016-02-12: 900 mg via INTRAVENOUS
  Filled 2016-02-12: qty 90

## 2016-02-12 NOTE — Patient Instructions (Signed)
[facility] Discharge Instructions for Patients Receiving Chemotherapy  Today you received the following chemotherapy agents Soliris  To help prevent nausea and vomiting after your treatment, we encourage you to take your nausea medication.   If you develop nausea and vomiting that is not controlled by your nausea medication, call the clinic.   BELOW ARE SYMPTOMS THAT SHOULD BE REPORTED IMMEDIATELY:  *FEVER GREATER THAN 100.5 F  *CHILLS WITH OR WITHOUT FEVER  NAUSEA AND VOMITING THAT IS NOT CONTROLLED WITH YOUR NAUSEA MEDICATION  *UNUSUAL SHORTNESS OF BREATH  *UNUSUAL BRUISING OR BLEEDING  TENDERNESS IN MOUTH AND THROAT WITH OR WITHOUT PRESENCE OF ULCERS  *URINARY PROBLEMS  *BOWEL PROBLEMS  UNUSUAL RASH Items with * indicate a potential emergency and should be followed up as soon as possible.  Feel free to call the clinic you have any questions or concerns. The clinic phone number is [phone].  Please show the CHEMO ALERT CARD at check-in to the Emergency Department and triage nurse.   

## 2016-02-12 NOTE — Progress Notes (Signed)
Pt aware of medication risks. Refusal to stay for 45min post-infusion.

## 2016-02-12 NOTE — Progress Notes (Signed)
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   PRIOR THERAPY:  1) Tapered dose of prednisone. 2) High dose of IVIG   CURRENT THERAPY: Soliris (Eculizumab) induction with 600 mg IV weekly 4 weeks followed by maintenance treatment at a dose of 900 mg IV every 2 weeks. First dose of treatment is expected on 11/21/2014. Status post 16 cycles.   INTERVAL HISTORY: Travis Mcdaniel 45 y.o. male returns to the clinic today for follow-up visit. The patient is doing fine today was no specific complaints. He is tolerating his treatment with Soliris status post 15 cycles fairly well with no significant adverse effects. He denied having any significant fever or chills. He has no nausea or vomiting. He denied having any significant chest pain, shortness of breath, cough or hemoptysis. He has no significant weight loss or night sweats. He is here today for evaluation and repeat blood work before starting cycle #17 of his treatment with Soliris.  MEDICAL HISTORY: Past Medical History:  Diagnosis Date  . Diverticulosis   . Encounter for antineoplastic chemotherapy 01/15/2016  . Hypertension     ALLERGIES:  has No Known Allergies.  MEDICATIONS:  Current Outpatient Prescriptions  Medication Sig Dispense Refill  . ALPRAZolam (XANAX) 1 MG tablet Take 1 mg by mouth 3 (three) times daily.  2  . hydrochlorothiazide (HYDRODIURIL) 25 MG tablet     . Multiple Vitamin (MULTIVITAMIN WITH MINERALS) TABS tablet Take 1 tablet by mouth daily.    . Psyllium (METAMUCIL PO) Take 1 capsule by mouth daily.     No current facility-administered medications for this visit.     SURGICAL HISTORY: History reviewed. No pertinent surgical history.  REVIEW OF SYSTEMS:  A comprehensive review of  systems was negative.   PHYSICAL EXAMINATION: General appearance: alert, cooperative and no distress Head: Normocephalic, without obvious abnormality, atraumatic Neck: no adenopathy, no JVD, supple, symmetrical, trachea midline and thyroid not enlarged, symmetric, no tenderness/mass/nodules Lymph nodes: Cervical, supraclavicular, and axillary nodes normal. Resp: clear to auscultation bilaterally Back: symmetric, no curvature. ROM normal. No CVA tenderness. Cardio: regular rate and rhythm, S1, S2 normal, no murmur, click, rub or gallop GI: soft, non-tender; bowel sounds normal; no masses,  no organomegaly Extremities: extremities normal, atraumatic, no cyanosis or edema Neurologic: Alert and oriented X 3, normal strength and tone. Normal symmetric reflexes. Normal coordination and gait  ECOG PERFORMANCE STATUS: 1 - Symptomatic but completely ambulatory  Blood pressure 131/76, pulse 69, temperature 98.6 F (37 C), temperature source Oral, resp. rate 18, height 5\' 6"  (1.676 m), weight 170 lb 11.2 oz (77.4 kg), SpO2 100 %.  LABORATORY DATA: Lab Results  Component Value Date   WBC 5.2 02/12/2016   HGB 12.7 (L) 02/12/2016   HCT 37.6 (L) 02/12/2016   MCV 109.9 (H) 02/12/2016   PLT 130 (L) 02/12/2016      Chemistry      Component Value Date/Time   NA 138 02/12/2016 1109   K 3.7 02/12/2016 1109   CL 110 09/07/2014 0407   CO2 23 02/12/2016 1109   BUN 10.8 02/12/2016 1109   CREATININE 0.8 02/12/2016 1109      Component Value Date/Time   CALCIUM 9.3 02/12/2016 1109   ALKPHOS 73 02/12/2016 1109   AST 37 (H) 02/12/2016 1109   ALT 39 02/12/2016  1109   BILITOT 2.62 (H) 02/12/2016 1109       RADIOGRAPHIC STUDIES: No results found.  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 He is currently on treatment with Soliris. He  is status post 16 cycles of treatment.  The patient is tolerating his treatment fairly well. I recommended for him to continue his treatment as a scheduled.  He will start cycle #17 today. He is now ready to be evaluated by the stem cell transplant team at [facility] for discussion of his condition. I would Referral to see Dr. Ok Edwards or one of his partners. He would come back for follow-up visit in 4 weeks for reevaluation before starting cycle #18.  The patient was also advised to take daily aspirin during his treatment with Soliris. For the hyperbilirubinemia, this is stable and we will continue to monitor it closely. For hypertension, he is currently on hydrochlorothiazide managed by Dr. [name]. The patient was advised to call immediately if he has any concerning symptoms in the interval. The patient voices understanding of current disease status and treatment options and is in agreement with the current care plan.  All questions were answered. The patient knows to call the clinic with any problems, questions or concerns. We can certainly see the patient much sooner if necessary.  Disclaimer: This note was dictated with voice recognition software. Similar sounding words can inadvertently be transcribed and may not be corrected upon review.

## 2016-02-17 ENCOUNTER — Telehealth: Payer: Self-pay | Admitting: Internal Medicine

## 2016-02-17 NOTE — Telephone Encounter (Signed)
Faxed pt medical records to [facility] for a new pt appt.  Waiting for appt.

## 2016-02-25 ENCOUNTER — Telehealth: Payer: Self-pay | Admitting: Internal Medicine

## 2016-02-25 NOTE — Telephone Encounter (Signed)
Pt appt with Dr. Virgina Norfolk @ [facility] is 03/20/16. Pt is aware. Records faxed Slides and scans will be fedex'ed

## 2016-02-26 ENCOUNTER — Ambulatory Visit (HOSPITAL_BASED_OUTPATIENT_CLINIC_OR_DEPARTMENT_OTHER): Payer: Commercial Managed Care - HMO

## 2016-02-26 ENCOUNTER — Other Ambulatory Visit: Payer: Commercial Managed Care - HMO

## 2016-02-26 ENCOUNTER — Ambulatory Visit: Payer: Commercial Managed Care - HMO | Admitting: Internal Medicine

## 2016-02-26 ENCOUNTER — Other Ambulatory Visit (HOSPITAL_BASED_OUTPATIENT_CLINIC_OR_DEPARTMENT_OTHER): Payer: Commercial Managed Care - HMO

## 2016-02-26 VITALS — BP 159/87 | HR 64 | Temp 97.6°F | Resp 18

## 2016-02-26 DIAGNOSIS — Z5112 Encounter for antineoplastic immunotherapy: Secondary | ICD-10-CM | POA: Diagnosis not present

## 2016-02-26 DIAGNOSIS — D595 Paroxysmal nocturnal hemoglobinuria [Marchiafava-Micheli]: Secondary | ICD-10-CM

## 2016-02-26 LAB — COMPREHENSIVE METABOLIC PANEL
ALT: 48 U/L (ref 0–55)
AST: 35 U/L — AB (ref 5–34)
Albumin: 4.2 g/dL (ref 3.5–5.0)
Alkaline Phosphatase: 75 U/L (ref 40–150)
Anion Gap: 11 mEq/L (ref 3–11)
BUN: 10.5 mg/dL (ref 7.0–26.0)
CALCIUM: 9.2 mg/dL (ref 8.4–10.4)
CHLORIDE: 104 meq/L (ref 98–109)
CO2: 23 mEq/L (ref 22–29)
CREATININE: 0.8 mg/dL (ref 0.7–1.3)
EGFR: >90 mL/min/{1.73_m2} (ref >90)
Glucose: 91 mg/dl (ref 70–140)
Potassium: 3.7 mEq/L (ref 3.5–5.1)
Sodium: 139 mEq/L (ref 136–145)
Total Bilirubin: 2.83 mg/dL — ABNORMAL HIGH (ref 0.20–1.20)
Total Protein: 7.2 g/dL (ref 6.4–8.3)

## 2016-02-26 LAB — CBC WITH DIFFERENTIAL/PLATELET
BASO%: 1 % (ref 0.0–2.0)
Basophils Absolute: 0.1 10*3/uL (ref 0.0–0.1)
EOS%: 6.1 % (ref 0.0–7.0)
Eosinophils Absolute: 0.3 10*3/uL (ref 0.0–0.5)
HEMATOCRIT: 37.1 % — AB (ref 38.4–49.9)
HEMOGLOBIN: 12.4 g/dL — AB (ref 13.0–17.1)
LYMPH#: 1.2 10*3/uL (ref 0.9–3.3)
LYMPH%: 23.4 % (ref 14.0–49.0)
MCH: 37.2 pg — ABNORMAL HIGH (ref 27.2–33.4)
MCHC: 33.3 g/dL (ref 32.0–36.0)
MCV: 111.7 fL — ABNORMAL HIGH (ref 79.3–98.0)
MONO#: 0.4 10*3/uL (ref 0.1–0.9)
MONO%: 8 % (ref 0.0–14.0)
NEUT#: 3 10*3/uL (ref 1.5–6.5)
NEUT%: 61.5 % (ref 39.0–75.0)
Platelets: 138 10*3/uL — ABNORMAL LOW (ref 140–400)
RBC: 3.32 10*6/uL — ABNORMAL LOW (ref 4.20–5.82)
RDW: 16 % — AB (ref 11.0–14.6)
WBC: 5 10*3/uL (ref 4.0–10.3)

## 2016-02-26 LAB — LACTATE DEHYDROGENASE: LDH: 263 U/L — AB (ref 125–245)

## 2016-02-26 MED ORDER — SODIUM CHLORIDE 0.9 % IV SOLN
900.0000 mg | Freq: Once | INTRAVENOUS | Status: AC
  Administered 2016-02-26: 900 mg via INTRAVENOUS
  Filled 2016-02-26: qty 90

## 2016-02-26 MED ORDER — SODIUM CHLORIDE 0.9 % IV SOLN
Freq: Once | INTRAVENOUS | Status: AC
  Administered 2016-02-26: 12:00:00 via INTRAVENOUS

## 2016-02-26 NOTE — Patient Instructions (Signed)
[facility] Discharge Instructions for Patients Receiving Chemotherapy  Today you received the following chemotherapy agents Soliris  To help prevent nausea and vomiting after your treatment, we encourage you to take your nausea medication.   If you develop nausea and vomiting that is not controlled by your nausea medication, call the clinic.   BELOW ARE SYMPTOMS THAT SHOULD BE REPORTED IMMEDIATELY:  *FEVER GREATER THAN 100.5 F  *CHILLS WITH OR WITHOUT FEVER  NAUSEA AND VOMITING THAT IS NOT CONTROLLED WITH YOUR NAUSEA MEDICATION  *UNUSUAL SHORTNESS OF BREATH  *UNUSUAL BRUISING OR BLEEDING  TENDERNESS IN MOUTH AND THROAT WITH OR WITHOUT PRESENCE OF ULCERS  *URINARY PROBLEMS  *BOWEL PROBLEMS  UNUSUAL RASH Items with * indicate a potential emergency and should be followed up as soon as possible.  Feel free to call the clinic you have any questions or concerns. The clinic phone number is [phone].  Please show the CHEMO ALERT CARD at check-in to the Emergency Department and triage nurse.   

## 2016-02-26 NOTE — Progress Notes (Signed)
Patient refused to stay for post infusion observation.

## 2016-03-09 ENCOUNTER — Telehealth: Payer: Self-pay | Admitting: Internal Medicine

## 2016-03-09 NOTE — Telephone Encounter (Signed)
Returned call to patient to make a change in schedule. Patient could not be reached.

## 2016-03-11 ENCOUNTER — Telehealth: Payer: Self-pay | Admitting: Internal Medicine

## 2016-03-11 ENCOUNTER — Other Ambulatory Visit: Payer: Commercial Managed Care - HMO

## 2016-03-11 ENCOUNTER — Ambulatory Visit: Payer: Commercial Managed Care - HMO | Admitting: Internal Medicine

## 2016-03-11 ENCOUNTER — Ambulatory Visit (HOSPITAL_BASED_OUTPATIENT_CLINIC_OR_DEPARTMENT_OTHER): Payer: Commercial Managed Care - HMO | Admitting: Internal Medicine

## 2016-03-11 ENCOUNTER — Encounter: Payer: Self-pay | Admitting: Internal Medicine

## 2016-03-11 ENCOUNTER — Ambulatory Visit (HOSPITAL_BASED_OUTPATIENT_CLINIC_OR_DEPARTMENT_OTHER): Payer: Commercial Managed Care - HMO

## 2016-03-11 ENCOUNTER — Other Ambulatory Visit (HOSPITAL_BASED_OUTPATIENT_CLINIC_OR_DEPARTMENT_OTHER): Payer: Commercial Managed Care - HMO

## 2016-03-11 VITALS — BP 138/82 | HR 78 | Resp 17

## 2016-03-11 VITALS — BP 143/72 | HR 56 | Temp 97.7°F | Resp 16 | Wt 173.4 lb

## 2016-03-11 DIAGNOSIS — Z5111 Encounter for antineoplastic chemotherapy: Secondary | ICD-10-CM

## 2016-03-11 DIAGNOSIS — D595 Paroxysmal nocturnal hemoglobinuria [Marchiafava-Micheli]: Secondary | ICD-10-CM | POA: Diagnosis not present

## 2016-03-11 DIAGNOSIS — Z5112 Encounter for antineoplastic immunotherapy: Secondary | ICD-10-CM | POA: Diagnosis not present

## 2016-03-11 LAB — CBC WITH DIFFERENTIAL/PLATELET
BASO%: 0.6 % (ref 0.0–2.0)
Basophils Absolute: 0 10*3/uL (ref 0.0–0.1)
EOS%: 6.7 % (ref 0.0–7.0)
Eosinophils Absolute: 0.4 10*3/uL (ref 0.0–0.5)
HCT: 38.8 % (ref 38.4–49.9)
HEMOGLOBIN: 13 g/dL (ref 13.0–17.1)
LYMPH%: 21 % (ref 14.0–49.0)
MCH: 38 pg — ABNORMAL HIGH (ref 27.2–33.4)
MCHC: 33.4 g/dL (ref 32.0–36.0)
MCV: 114 fL — ABNORMAL HIGH (ref 79.3–98.0)
MONO#: 0.4 10*3/uL (ref 0.1–0.9)
MONO%: 7 % (ref 0.0–14.0)
NEUT%: 64.7 % (ref 39.0–75.0)
NEUTROS ABS: 4 10*3/uL (ref 1.5–6.5)
Platelets: 146 10*3/uL (ref 140–400)
RBC: 3.41 10*6/uL — ABNORMAL LOW (ref 4.20–5.82)
RDW: 16.2 % — AB (ref 11.0–14.6)
WBC: 6.3 10*3/uL (ref 4.0–10.3)
lymph#: 1.3 10*3/uL (ref 0.9–3.3)

## 2016-03-11 LAB — COMPREHENSIVE METABOLIC PANEL
ALBUMIN: 4.3 g/dL (ref 3.5–5.0)
ALK PHOS: 84 U/L (ref 40–150)
ALT: 62 U/L — AB (ref 0–55)
AST: 45 U/L — ABNORMAL HIGH (ref 5–34)
Anion Gap: 12 mEq/L — ABNORMAL HIGH (ref 3–11)
BILIRUBIN TOTAL: 2.85 mg/dL — AB (ref 0.20–1.20)
BUN: 11.6 mg/dL (ref 7.0–26.0)
CO2: 23 meq/L (ref 22–29)
CREATININE: 0.8 mg/dL (ref 0.7–1.3)
Calcium: 9.5 mg/dL (ref 8.4–10.4)
Chloride: 105 mEq/L (ref 98–109)
GLUCOSE: 85 mg/dL (ref 70–140)
Potassium: 3.9 mEq/L (ref 3.5–5.1)
SODIUM: 140 meq/L (ref 136–145)
TOTAL PROTEIN: 7.5 g/dL (ref 6.4–8.3)

## 2016-03-11 LAB — LACTATE DEHYDROGENASE: LDH: 274 U/L — ABNORMAL HIGH (ref 125–245)

## 2016-03-11 MED ORDER — SODIUM CHLORIDE 0.9 % IV SOLN
Freq: Once | INTRAVENOUS | Status: AC
  Administered 2016-03-11: 13:00:00 via INTRAVENOUS

## 2016-03-11 MED ORDER — SODIUM CHLORIDE 0.9 % IV SOLN
900.0000 mg | Freq: Once | INTRAVENOUS | Status: AC
  Administered 2016-03-11: 900 mg via INTRAVENOUS
  Filled 2016-03-11: qty 90

## 2016-03-11 NOTE — Telephone Encounter (Signed)
Message sent to chemo scheduler to add chemo per 03/11/16 los. Avs report and schedule given per 03/11/16 los. °

## 2016-03-11 NOTE — Progress Notes (Signed)
Pt does not stay for observation period - he stated he feels well and has to go to work.

## 2016-03-11 NOTE — Progress Notes (Signed)
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   PRIOR THERAPY:  1) Tapered dose of prednisone. 2) High dose of IVIG   CURRENT THERAPY: Soliris (Eculizumab) induction with 600 mg IV weekly 4 weeks followed by maintenance treatment at a dose of 900 mg IV every 2 weeks. First dose of treatment is expected on 11/21/2014. Status post 17 cycles.   INTERVAL HISTORY: Travis Mcdaniel 45 y.o. male returns to the clinic today for follow-up visit. The patient is doing fine today with no specific complaints. He is tolerating his treatment with Soliris status post 17 cycles fairly well with no significant adverse effects. He has an appointment with [facility] stem cell transplant team next week. He denied having any significant fever or chills. He has no nausea or vomiting. He denied having any significant chest pain, shortness of breath, cough or hemoptysis. He has no significant weight loss or night sweats. He is here today for evaluation and repeat blood work before starting cycle #18 of his treatment with Soliris.  MEDICAL HISTORY: Past Medical History:  Diagnosis Date  . Diverticulosis   . Encounter for antineoplastic chemotherapy 01/15/2016  . Hypertension     ALLERGIES:  has No Known Allergies.  MEDICATIONS:  Current Outpatient Prescriptions  Medication Sig Dispense Refill  . ALPRAZolam (XANAX) 1 MG tablet Take 1 mg by mouth 3 (three) times daily.  2  . hydrochlorothiazide (HYDRODIURIL) 25 MG tablet     . Multiple Vitamin (MULTIVITAMIN WITH MINERALS) TABS tablet Take 1 tablet by mouth daily.    . Psyllium (METAMUCIL PO) Take 1 capsule by mouth daily.     No current facility-administered medications for this visit.     SURGICAL HISTORY: No past surgical  history on file.  REVIEW OF SYSTEMS:  A comprehensive review of systems was negative.   PHYSICAL EXAMINATION: General appearance: alert, cooperative and no distress Head: Normocephalic, without obvious abnormality, atraumatic Neck: no adenopathy, no JVD, supple, symmetrical, trachea midline and thyroid not enlarged, symmetric, no tenderness/mass/nodules Lymph nodes: Cervical, supraclavicular, and axillary nodes normal. Resp: clear to auscultation bilaterally Back: symmetric, no curvature. ROM normal. No CVA tenderness. Cardio: regular rate and rhythm, S1, S2 normal, no murmur, click, rub or gallop GI: soft, non-tender; bowel sounds normal; no masses,  no organomegaly Extremities: extremities normal, atraumatic, no cyanosis or edema Neurologic: Alert and oriented X 3, normal strength and tone. Normal symmetric reflexes. Normal coordination and gait  ECOG PERFORMANCE STATUS: 1 - Symptomatic but completely ambulatory  There were no vitals taken for this visit.  LABORATORY DATA: Lab Results  Component Value Date   WBC 6.3 03/11/2016   HGB 13.0 03/11/2016   HCT 38.8 03/11/2016   MCV 114.0 (H) 03/11/2016   PLT 146 03/11/2016      Chemistry      Component Value Date/Time   NA 140 03/11/2016 1033   K 3.9 03/11/2016 1033   CL 110 09/07/2014 0407   CO2 23 03/11/2016 1033   BUN 11.6 03/11/2016 1033   CREATININE 0.8 03/11/2016 1033      Component Value Date/Time   CALCIUM 9.5 03/11/2016 1033   ALKPHOS 84 03/11/2016 1033   AST 45 (H) 03/11/2016 1033   ALT 62 (H) 03/11/2016 1033   BILITOT 2.85 (H) 03/11/2016 1033  RADIOGRAPHIC STUDIES: No results found.  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 He is currently on treatment with Soliris. He is status post 17 cycles of treatment.  The patient is tolerating his  treatment fairly well. I recommended for him to continue his treatment as a scheduled.  He will start cycle #18 today. He is scheduled to see Dr. Clydene Laming at [facility] stem cell transplant unit next week. He would come back for follow-up visit in 4 weeks for reevaluation before starting cycle #19.  The patient was also advised to take daily aspirin during his treatment with Soliris. For the hyperbilirubinemia, this is stable and we will continue to monitor it closely. For hypertension, he is currently on hydrochlorothiazide managed by Dr. [name]. The patient was advised to call immediately if he has any concerning symptoms in the interval. The patient voices understanding of current disease status and treatment options and is in agreement with the current care plan.  All questions were answered. The patient knows to call the clinic with any problems, questions or concerns. We can certainly see the patient much sooner if necessary.  Disclaimer: This note was dictated with voice recognition software. Similar sounding words can inadvertently be transcribed and may not be corrected upon review.

## 2016-03-11 NOTE — Patient Instructions (Signed)
[facility] Discharge Instructions for Patients Receiving Chemotherapy  Today you received the following chemotherapy agents Solaris.   To help prevent nausea and vomiting after your treatment, we encourage you to take your nausea medication as directed.   If you develop nausea and vomiting that is not controlled by your nausea medication, call the clinic.   BELOW ARE SYMPTOMS THAT SHOULD BE REPORTED IMMEDIATELY:  *FEVER GREATER THAN 100.5 F  *CHILLS WITH OR WITHOUT FEVER  NAUSEA AND VOMITING THAT IS NOT CONTROLLED WITH YOUR NAUSEA MEDICATION  *UNUSUAL SHORTNESS OF BREATH  *UNUSUAL BRUISING OR BLEEDING  TENDERNESS IN MOUTH AND THROAT WITH OR WITHOUT PRESENCE OF ULCERS  *URINARY PROBLEMS  *BOWEL PROBLEMS  UNUSUAL RASH Items with * indicate a potential emergency and should be followed up as soon as possible.  Feel free to call the clinic you have any questions or concerns. The clinic phone number is [phone].  Please show the CHEMO ALERT CARD at check-in to the Emergency Department and triage nurse.   

## 2016-03-16 ENCOUNTER — Ambulatory Visit: Payer: Commercial Managed Care - HMO | Admitting: Internal Medicine

## 2016-03-19 ENCOUNTER — Telehealth: Payer: Self-pay | Admitting: Internal Medicine

## 2016-03-19 NOTE — Telephone Encounter (Signed)
Appt. Change @ [facility]-new appt. 04/13/16@11 :00 with Dr. Clydene Laming. Pt is aware.

## 2016-03-25 ENCOUNTER — Other Ambulatory Visit (HOSPITAL_BASED_OUTPATIENT_CLINIC_OR_DEPARTMENT_OTHER): Payer: Commercial Managed Care - HMO

## 2016-03-25 ENCOUNTER — Ambulatory Visit (HOSPITAL_BASED_OUTPATIENT_CLINIC_OR_DEPARTMENT_OTHER): Payer: Commercial Managed Care - HMO

## 2016-03-25 ENCOUNTER — Ambulatory Visit: Payer: Commercial Managed Care - HMO | Admitting: Internal Medicine

## 2016-03-25 ENCOUNTER — Telehealth: Payer: Self-pay | Admitting: *Deleted

## 2016-03-25 VITALS — BP 139/70 | HR 63 | Temp 98.3°F | Resp 18

## 2016-03-25 DIAGNOSIS — D595 Paroxysmal nocturnal hemoglobinuria [Marchiafava-Micheli]: Secondary | ICD-10-CM

## 2016-03-25 DIAGNOSIS — Z5112 Encounter for antineoplastic immunotherapy: Secondary | ICD-10-CM | POA: Diagnosis not present

## 2016-03-25 LAB — CBC WITH DIFFERENTIAL/PLATELET
BASO%: 0.5 % (ref 0.0–2.0)
BASOS ABS: 0 10*3/uL (ref 0.0–0.1)
EOS%: 6.4 % (ref 0.0–7.0)
Eosinophils Absolute: 0.3 10*3/uL (ref 0.0–0.5)
HEMATOCRIT: 37.3 % — AB (ref 38.4–49.9)
HGB: 12.5 g/dL — ABNORMAL LOW (ref 13.0–17.1)
LYMPH#: 1.3 10*3/uL (ref 0.9–3.3)
LYMPH%: 29 % (ref 14.0–49.0)
MCH: 37 pg — AB (ref 27.2–33.4)
MCHC: 33.5 g/dL (ref 32.0–36.0)
MCV: 110.4 fL — AB (ref 79.3–98.0)
MONO#: 0.4 10*3/uL (ref 0.1–0.9)
MONO%: 8.4 % (ref 0.0–14.0)
NEUT#: 2.4 10*3/uL (ref 1.5–6.5)
NEUT%: 55.7 % (ref 39.0–75.0)
PLATELETS: 121 10*3/uL — AB (ref 140–400)
RBC: 3.38 10*6/uL — AB (ref 4.20–5.82)
RDW: 13.6 % (ref 11.0–14.6)
WBC: 4.4 10*3/uL (ref 4.0–10.3)

## 2016-03-25 LAB — COMPREHENSIVE METABOLIC PANEL
ALT: 63 U/L — ABNORMAL HIGH (ref 0–55)
ANION GAP: 11 meq/L (ref 3–11)
AST: 47 U/L — ABNORMAL HIGH (ref 5–34)
Albumin: 4 g/dL (ref 3.5–5.0)
Alkaline Phosphatase: 69 U/L (ref 40–150)
BUN: 8.3 mg/dL (ref 7.0–26.0)
CALCIUM: 9 mg/dL (ref 8.4–10.4)
CHLORIDE: 106 meq/L (ref 98–109)
CO2: 22 mEq/L (ref 22–29)
CREATININE: 0.8 mg/dL (ref 0.7–1.3)
Glucose: 90 mg/dl (ref 70–140)
POTASSIUM: 3.8 meq/L (ref 3.5–5.1)
Sodium: 139 mEq/L (ref 136–145)
Total Bilirubin: 2 mg/dL — ABNORMAL HIGH (ref 0.20–1.20)
Total Protein: 6.8 g/dL (ref 6.4–8.3)

## 2016-03-25 LAB — LACTATE DEHYDROGENASE: LDH: 225 U/L (ref 125–245)

## 2016-03-25 MED ORDER — SODIUM CHLORIDE 0.9 % IV SOLN
Freq: Once | INTRAVENOUS | Status: AC
  Administered 2016-03-25: 11:00:00 via INTRAVENOUS

## 2016-03-25 MED ORDER — ECULIZUMAB 300 MG/30ML IV SOLN
900.0000 mg | Freq: Once | INTRAVENOUS | Status: AC
  Administered 2016-03-25: 900 mg via INTRAVENOUS
  Filled 2016-03-25: qty 90

## 2016-03-25 NOTE — Patient Instructions (Signed)
[facility] Discharge Instructions for Patients Receiving Chemotherapy  Today you received the following chemotherapy agents Solaris.   To help prevent nausea and vomiting after your treatment, we encourage you to take your nausea medication as directed.   If you develop nausea and vomiting that is not controlled by your nausea medication, call the clinic.   BELOW ARE SYMPTOMS THAT SHOULD BE REPORTED IMMEDIATELY:  *FEVER GREATER THAN 100.5 F  *CHILLS WITH OR WITHOUT FEVER  NAUSEA AND VOMITING THAT IS NOT CONTROLLED WITH YOUR NAUSEA MEDICATION  *UNUSUAL SHORTNESS OF BREATH  *UNUSUAL BRUISING OR BLEEDING  TENDERNESS IN MOUTH AND THROAT WITH OR WITHOUT PRESENCE OF ULCERS  *URINARY PROBLEMS  *BOWEL PROBLEMS  UNUSUAL RASH Items with * indicate a potential emergency and should be followed up as soon as possible.  Feel free to call the clinic you have any questions or concerns. The clinic phone number is [phone].  Please show the CHEMO ALERT CARD at check-in to the Emergency Department and triage nurse.   

## 2016-03-25 NOTE — Telephone Encounter (Signed)
Per patient request and LOS on 9/13 I have scheduled appats. Patient aware and given calendar

## 2016-03-25 NOTE — Progress Notes (Signed)
Pt tolerated soliris today. Denies any symptoms and refused to stay 1hr post infusion. Pt states that he feels great. AVS and schedule updated. VSS upon discharge.

## 2016-04-08 ENCOUNTER — Ambulatory Visit (HOSPITAL_BASED_OUTPATIENT_CLINIC_OR_DEPARTMENT_OTHER): Payer: Commercial Managed Care - HMO

## 2016-04-08 ENCOUNTER — Ambulatory Visit (HOSPITAL_BASED_OUTPATIENT_CLINIC_OR_DEPARTMENT_OTHER): Payer: Commercial Managed Care - HMO | Admitting: Internal Medicine

## 2016-04-08 ENCOUNTER — Other Ambulatory Visit (HOSPITAL_BASED_OUTPATIENT_CLINIC_OR_DEPARTMENT_OTHER): Payer: Commercial Managed Care - HMO

## 2016-04-08 ENCOUNTER — Encounter: Payer: Self-pay | Admitting: Internal Medicine

## 2016-04-08 VITALS — BP 135/72 | HR 69 | Temp 98.0°F | Resp 18 | Ht 66.0 in | Wt 176.2 lb

## 2016-04-08 DIAGNOSIS — Z5112 Encounter for antineoplastic immunotherapy: Secondary | ICD-10-CM

## 2016-04-08 DIAGNOSIS — D595 Paroxysmal nocturnal hemoglobinuria [Marchiafava-Micheli]: Secondary | ICD-10-CM

## 2016-04-08 DIAGNOSIS — Z5111 Encounter for antineoplastic chemotherapy: Secondary | ICD-10-CM

## 2016-04-08 DIAGNOSIS — I10 Essential (primary) hypertension: Secondary | ICD-10-CM | POA: Diagnosis not present

## 2016-04-08 LAB — CBC WITH DIFFERENTIAL/PLATELET
BASO%: 0.6 % (ref 0.0–2.0)
Basophils Absolute: 0 10*3/uL (ref 0.0–0.1)
EOS%: 6.7 % (ref 0.0–7.0)
Eosinophils Absolute: 0.3 10*3/uL (ref 0.0–0.5)
HCT: 39.3 % (ref 38.4–49.9)
HGB: 13 g/dL (ref 13.0–17.1)
LYMPH%: 27 % (ref 14.0–49.0)
MCH: 36.8 pg — ABNORMAL HIGH (ref 27.2–33.4)
MCHC: 33.2 g/dL (ref 32.0–36.0)
MCV: 111 fL — ABNORMAL HIGH (ref 79.3–98.0)
MONO#: 0.5 10*3/uL (ref 0.1–0.9)
MONO%: 9.5 % (ref 0.0–14.0)
NEUT#: 2.9 10*3/uL (ref 1.5–6.5)
NEUT%: 56.2 % (ref 39.0–75.0)
Platelets: 135 10*3/uL — ABNORMAL LOW (ref 140–400)
RBC: 3.54 10*6/uL — AB (ref 4.20–5.82)
RDW: 15 % — ABNORMAL HIGH (ref 11.0–14.6)
WBC: 5.2 10*3/uL (ref 4.0–10.3)
lymph#: 1.4 10*3/uL (ref 0.9–3.3)

## 2016-04-08 LAB — COMPREHENSIVE METABOLIC PANEL
ALT: 55 U/L (ref 0–55)
ANION GAP: 11 meq/L (ref 3–11)
AST: 43 U/L — AB (ref 5–34)
Albumin: 4.1 g/dL (ref 3.5–5.0)
Alkaline Phosphatase: 76 U/L (ref 40–150)
BILIRUBIN TOTAL: 2.86 mg/dL — AB (ref 0.20–1.20)
BUN: 9 mg/dL (ref 7.0–26.0)
CHLORIDE: 107 meq/L (ref 98–109)
CO2: 22 meq/L (ref 22–29)
CREATININE: 0.8 mg/dL (ref 0.7–1.3)
Calcium: 8.9 mg/dL (ref 8.4–10.4)
EGFR: >90 mL/min/{1.73_m2} (ref >90)
GLUCOSE: 96 mg/dL (ref 70–140)
Potassium: 4 mEq/L (ref 3.5–5.1)
SODIUM: 140 meq/L (ref 136–145)
Total Protein: 6.9 g/dL (ref 6.4–8.3)

## 2016-04-08 LAB — LACTATE DEHYDROGENASE: LDH: 224 U/L (ref 125–245)

## 2016-04-08 MED ORDER — SODIUM CHLORIDE 0.9 % IV SOLN
900.0000 mg | Freq: Once | INTRAVENOUS | Status: AC
  Administered 2016-04-08: 900 mg via INTRAVENOUS
  Filled 2016-04-08: qty 90

## 2016-04-08 MED ORDER — SODIUM CHLORIDE 0.9 % IV SOLN
Freq: Once | INTRAVENOUS | Status: AC
  Administered 2016-04-08: 12:00:00 via INTRAVENOUS

## 2016-04-08 NOTE — Progress Notes (Signed)
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   PRIOR THERAPY:  1) Tapered dose of prednisone. 2) High dose of IVIG   CURRENT THERAPY: Soliris (Eculizumab) induction with 600 mg IV weekly 4 weeks followed by maintenance treatment at a dose of 900 mg IV every 2 weeks. First dose of treatment was given on 11/21/2014. Status post 18 cycles.   INTERVAL HISTORY: Travis Mcdaniel 45 y.o. male returns to the clinic today for follow-up visit. The patient is doing fine today with no specific complaints. He is tolerating his treatment with Soliris status post 18 cycles fairly well with no significant adverse effects. He has an appointment with [facility] stem cell transplant team on 04/13/2016 with [name]. He denied having any significant fever or chills. He has no nausea or vomiting. He denied having any significant chest pain, shortness of breath, cough or hemoptysis. He has no significant weight loss or night sweats. He is here today for evaluation and repeat blood work before starting cycle #19 of his treatment with Soliris.  MEDICAL HISTORY: Past Medical History:  Diagnosis Date  . Diverticulosis   . Encounter for antineoplastic chemotherapy 01/15/2016  . Hypertension     ALLERGIES:  has No Known Allergies.  MEDICATIONS:  Current Outpatient Prescriptions  Medication Sig Dispense Refill  . ALPRAZolam (XANAX) 1 MG tablet Take 1 mg by mouth 3 (three) times daily.  2  . Multiple Vitamin (MULTIVITAMIN WITH MINERALS) TABS tablet Take 1 tablet by mouth daily.    . Psyllium (METAMUCIL PO) Take 1 capsule by mouth daily.    . hydrochlorothiazide (HYDRODIURIL) 25 MG tablet      No current facility-administered medications for this visit.     SURGICAL HISTORY:  History reviewed. No pertinent surgical history.  REVIEW OF SYSTEMS:  A comprehensive review of systems was negative.   PHYSICAL EXAMINATION: General appearance: alert, cooperative and no distress Head: Normocephalic, without obvious abnormality, atraumatic Neck: no adenopathy, no JVD, supple, symmetrical, trachea midline and thyroid not enlarged, symmetric, no tenderness/mass/nodules Lymph nodes: Cervical, supraclavicular, and axillary nodes normal. Resp: clear to auscultation bilaterally Back: symmetric, no curvature. ROM normal. No CVA tenderness. Cardio: regular rate and rhythm, S1, S2 normal, no murmur, click, rub or gallop GI: soft, non-tender; bowel sounds normal; no masses,  no organomegaly Extremities: extremities normal, atraumatic, no cyanosis or edema Neurologic: Alert and oriented X 3, normal strength and tone. Normal symmetric reflexes. Normal coordination and gait  ECOG PERFORMANCE STATUS: 1 - Symptomatic but completely ambulatory  Blood pressure 135/72, pulse 69, temperature 98 F (36.7 C), temperature source Oral, resp. rate 18, height 5\' 6"  (1.676 m), weight 176 lb 3.2 oz (79.9 kg), SpO2 100 %.  LABORATORY DATA: Lab Results  Component Value Date   WBC 5.2 04/08/2016   HGB 13.0 04/08/2016   HCT 39.3 04/08/2016   MCV 111.0 (H) 04/08/2016   PLT 135 (L) 04/08/2016      Chemistry      Component Value Date/Time   NA 140 04/08/2016 1004   K 4.0 04/08/2016 1004   CL 110 09/07/2014 0407   CO2 22 04/08/2016 1004   BUN 9.0 04/08/2016 1004   CREATININE 0.8 04/08/2016 1004      Component Value Date/Time   CALCIUM 8.9 04/08/2016 1004   ALKPHOS  76 04/08/2016 1004   AST 43 (H) 04/08/2016 1004   ALT 55 04/08/2016 1004   BILITOT 2.86 (H) 04/08/2016 1004       RADIOGRAPHIC STUDIES: No results found.  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 He is currently on treatment with Soliris. He is status post 18 cycles of treatment.  The patient is tolerating his treatment fairly well. I recommended for him to continue his treatment as a scheduled.  He will start cycle #19 today. He is scheduled to see [name] for discussion of the stem cell transplant on 04/13/2016. He would come back for follow-up visit in 4 weeks for reevaluation before starting cycle #20.  The patient will continue to take daily aspirin during his treatment with Soliris. For the hyperbilirubinemia, this is stable and we will continue to monitor it closely. For hypertension, he is currently on hydrochlorothiazide managed by Dr. [name]. The patient was advised to call immediately if he has any concerning symptoms in the interval. The patient voices understanding of current disease status and treatment options and is in agreement with the current care plan.  All questions were answered. The patient knows to call the clinic with any problems, questions or concerns. We can certainly see the patient much sooner if necessary.  Disclaimer: This note was dictated with voice recognition software. Similar sounding words can inadvertently be transcribed and may not be corrected upon review.

## 2016-04-08 NOTE — Patient Instructions (Signed)
[facility] Discharge Instructions for Patients Receiving Chemotherapy  Today you received the following chemotherapy agents Solaris.   To help prevent nausea and vomiting after your treatment, we encourage you to take your nausea medication as directed.   If you develop nausea and vomiting that is not controlled by your nausea medication, call the clinic.   BELOW ARE SYMPTOMS THAT SHOULD BE REPORTED IMMEDIATELY:  *FEVER GREATER THAN 100.5 F  *CHILLS WITH OR WITHOUT FEVER  NAUSEA AND VOMITING THAT IS NOT CONTROLLED WITH YOUR NAUSEA MEDICATION  *UNUSUAL SHORTNESS OF BREATH  *UNUSUAL BRUISING OR BLEEDING  TENDERNESS IN MOUTH AND THROAT WITH OR WITHOUT PRESENCE OF ULCERS  *URINARY PROBLEMS  *BOWEL PROBLEMS  UNUSUAL RASH Items with * indicate a potential emergency and should be followed up as soon as possible.  Feel free to call the clinic you have any questions or concerns. The clinic phone number is [phone].  Please show the CHEMO ALERT CARD at check-in to the Emergency Department and triage nurse.   

## 2016-04-12 ENCOUNTER — Telehealth: Payer: Self-pay | Admitting: Internal Medicine

## 2016-04-12 NOTE — Telephone Encounter (Signed)
PATIENT ALREADY ON SCHEDULE FOR LAB/FU/TX THRU December. FU Q4W.

## 2016-04-22 ENCOUNTER — Ambulatory Visit (HOSPITAL_BASED_OUTPATIENT_CLINIC_OR_DEPARTMENT_OTHER): Payer: Commercial Managed Care - HMO

## 2016-04-22 ENCOUNTER — Other Ambulatory Visit (HOSPITAL_BASED_OUTPATIENT_CLINIC_OR_DEPARTMENT_OTHER): Payer: Commercial Managed Care - HMO

## 2016-04-22 VITALS — BP 129/78 | HR 68 | Temp 98.2°F | Resp 18

## 2016-04-22 DIAGNOSIS — Z5112 Encounter for antineoplastic immunotherapy: Secondary | ICD-10-CM | POA: Diagnosis not present

## 2016-04-22 DIAGNOSIS — D595 Paroxysmal nocturnal hemoglobinuria [Marchiafava-Micheli]: Secondary | ICD-10-CM

## 2016-04-22 LAB — COMPREHENSIVE METABOLIC PANEL
ALK PHOS: 87 U/L (ref 40–150)
ALT: 59 U/L — ABNORMAL HIGH (ref 0–55)
AST: 39 U/L — ABNORMAL HIGH (ref 5–34)
Albumin: 4.2 g/dL (ref 3.5–5.0)
Anion Gap: 10 mEq/L (ref 3–11)
BUN: 10.2 mg/dL (ref 7.0–26.0)
CHLORIDE: 106 meq/L (ref 98–109)
CO2: 23 meq/L (ref 22–29)
Calcium: 9.2 mg/dL (ref 8.4–10.4)
Creatinine: 0.8 mg/dL (ref 0.7–1.3)
GLUCOSE: 95 mg/dL (ref 70–140)
POTASSIUM: 4 meq/L (ref 3.5–5.1)
SODIUM: 139 meq/L (ref 136–145)
Total Bilirubin: 3.51 mg/dL (ref 0.20–1.20)
Total Protein: 7.2 g/dL (ref 6.4–8.3)

## 2016-04-22 LAB — LACTATE DEHYDROGENASE: LDH: 254 U/L — ABNORMAL HIGH (ref 125–245)

## 2016-04-22 LAB — CBC WITH DIFFERENTIAL/PLATELET
BASO%: 0.9 % (ref 0.0–2.0)
BASOS ABS: 0 10*3/uL (ref 0.0–0.1)
EOS ABS: 0.3 10*3/uL (ref 0.0–0.5)
EOS%: 6.4 % (ref 0.0–7.0)
HCT: 38.8 % (ref 38.4–49.9)
HGB: 12.9 g/dL — ABNORMAL LOW (ref 13.0–17.1)
LYMPH%: 21.3 % (ref 14.0–49.0)
MCH: 36.9 pg — AB (ref 27.2–33.4)
MCHC: 33.2 g/dL (ref 32.0–36.0)
MCV: 111.3 fL — AB (ref 79.3–98.0)
MONO#: 0.4 10*3/uL (ref 0.1–0.9)
MONO%: 7.4 % (ref 0.0–14.0)
NEUT#: 3.2 10*3/uL (ref 1.5–6.5)
NEUT%: 64 % (ref 39.0–75.0)
Platelets: 151 10*3/uL (ref 140–400)
RBC: 3.48 10*6/uL — AB (ref 4.20–5.82)
RDW: 15.7 % — ABNORMAL HIGH (ref 11.0–14.6)
WBC: 4.9 10*3/uL (ref 4.0–10.3)
lymph#: 1.1 10*3/uL (ref 0.9–3.3)

## 2016-04-22 MED ORDER — SODIUM CHLORIDE 0.9 % IV SOLN
900.0000 mg | Freq: Once | INTRAVENOUS | Status: AC
  Administered 2016-04-22: 900 mg via INTRAVENOUS
  Filled 2016-04-22: qty 30

## 2016-04-22 MED ORDER — SODIUM CHLORIDE 0.9 % IV SOLN
Freq: Once | INTRAVENOUS | Status: AC
  Administered 2016-04-22: 08:00:00 via INTRAVENOUS

## 2016-04-22 NOTE — Progress Notes (Signed)
Bilirubin 3.51, okay to treat per Dr. Julien Nordmann.   0949: Patient refused one hour post infusion observation. Patient and vital signs stable upon discharge.

## 2016-04-22 NOTE — Patient Instructions (Signed)
[facility] Discharge Instructions for Patients Receiving Chemotherapy  Today you received the following chemotherapy agents:  Soliris.  To help prevent nausea and vomiting after your treatment, we encourage you to take your nausea medication as directed.   If you develop nausea and vomiting that is not controlled by your nausea medication, call the clinic.   BELOW ARE SYMPTOMS THAT SHOULD BE REPORTED IMMEDIATELY:  *FEVER GREATER THAN 100.5 F  *CHILLS WITH OR WITHOUT FEVER  NAUSEA AND VOMITING THAT IS NOT CONTROLLED WITH YOUR NAUSEA MEDICATION  *UNUSUAL SHORTNESS OF BREATH  *UNUSUAL BRUISING OR BLEEDING  TENDERNESS IN MOUTH AND THROAT WITH OR WITHOUT PRESENCE OF ULCERS  *URINARY PROBLEMS  *BOWEL PROBLEMS  UNUSUAL RASH Items with * indicate a potential emergency and should be followed up as soon as possible.  Feel free to call the clinic you have any questions or concerns. The clinic phone number is [phone].  Please show the CHEMO ALERT CARD at check-in to the Emergency Department and triage nurse.   

## 2016-04-24 ENCOUNTER — Telehealth: Payer: Self-pay | Admitting: Internal Medicine

## 2016-04-24 NOTE — Telephone Encounter (Signed)
Per MM moved 11/8 appointments from AM to PM due to late start. Left message for patient and mailed schedule.

## 2016-05-06 ENCOUNTER — Encounter: Payer: Self-pay | Admitting: Internal Medicine

## 2016-05-06 ENCOUNTER — Ambulatory Visit (HOSPITAL_BASED_OUTPATIENT_CLINIC_OR_DEPARTMENT_OTHER): Payer: Commercial Managed Care - HMO

## 2016-05-06 ENCOUNTER — Other Ambulatory Visit (HOSPITAL_BASED_OUTPATIENT_CLINIC_OR_DEPARTMENT_OTHER): Payer: Commercial Managed Care - HMO

## 2016-05-06 ENCOUNTER — Ambulatory Visit (HOSPITAL_BASED_OUTPATIENT_CLINIC_OR_DEPARTMENT_OTHER): Payer: Commercial Managed Care - HMO | Admitting: Internal Medicine

## 2016-05-06 ENCOUNTER — Telehealth: Payer: Self-pay | Admitting: Internal Medicine

## 2016-05-06 VITALS — BP 138/70 | HR 68 | Temp 97.9°F | Resp 18 | Ht 66.0 in | Wt 176.0 lb

## 2016-05-06 DIAGNOSIS — I10 Essential (primary) hypertension: Secondary | ICD-10-CM

## 2016-05-06 DIAGNOSIS — Z5112 Encounter for antineoplastic immunotherapy: Secondary | ICD-10-CM | POA: Diagnosis not present

## 2016-05-06 DIAGNOSIS — D595 Paroxysmal nocturnal hemoglobinuria [Marchiafava-Micheli]: Secondary | ICD-10-CM

## 2016-05-06 DIAGNOSIS — Z5111 Encounter for antineoplastic chemotherapy: Secondary | ICD-10-CM

## 2016-05-06 LAB — COMPREHENSIVE METABOLIC PANEL
ALBUMIN: 4.1 g/dL (ref 3.5–5.0)
ALK PHOS: 73 U/L (ref 40–150)
ALT: 46 U/L (ref 0–55)
ANION GAP: 10 meq/L (ref 3–11)
AST: 30 U/L (ref 5–34)
BUN: 16.4 mg/dL (ref 7.0–26.0)
CO2: 22 mEq/L (ref 22–29)
Calcium: 9.3 mg/dL (ref 8.4–10.4)
Chloride: 107 mEq/L (ref 98–109)
Creatinine: 0.8 mg/dL (ref 0.7–1.3)
GLUCOSE: 96 mg/dL (ref 70–140)
POTASSIUM: 3.9 meq/L (ref 3.5–5.1)
Sodium: 139 mEq/L (ref 136–145)
TOTAL PROTEIN: 7.1 g/dL (ref 6.4–8.3)
Total Bilirubin: 1.97 mg/dL — ABNORMAL HIGH (ref 0.20–1.20)

## 2016-05-06 LAB — CBC WITH DIFFERENTIAL/PLATELET
BASO%: 0.6 % (ref 0.0–2.0)
BASOS ABS: 0 10*3/uL (ref 0.0–0.1)
EOS%: 6.4 % (ref 0.0–7.0)
Eosinophils Absolute: 0.3 10*3/uL (ref 0.0–0.5)
HEMATOCRIT: 38.4 % (ref 38.4–49.9)
HEMOGLOBIN: 12.5 g/dL — AB (ref 13.0–17.1)
LYMPH#: 1.4 10*3/uL (ref 0.9–3.3)
LYMPH%: 26.7 % (ref 14.0–49.0)
MCH: 36.1 pg — AB (ref 27.2–33.4)
MCHC: 32.6 g/dL (ref 32.0–36.0)
MCV: 111 fL — ABNORMAL HIGH (ref 79.3–98.0)
MONO#: 0.6 10*3/uL (ref 0.1–0.9)
MONO%: 10.7 % (ref 0.0–14.0)
NEUT#: 3 10*3/uL (ref 1.5–6.5)
NEUT%: 55.6 % (ref 39.0–75.0)
Platelets: 143 10*3/uL (ref 140–400)
RBC: 3.46 10*6/uL — ABNORMAL LOW (ref 4.20–5.82)
RDW: 15.2 % — AB (ref 11.0–14.6)
WBC: 5.3 10*3/uL (ref 4.0–10.3)

## 2016-05-06 LAB — LACTATE DEHYDROGENASE: LDH: 251 U/L — ABNORMAL HIGH (ref 125–245)

## 2016-05-06 MED ORDER — SODIUM CHLORIDE 0.9 % IV SOLN
900.0000 mg | Freq: Once | INTRAVENOUS | Status: AC
  Administered 2016-05-06: 900 mg via INTRAVENOUS
  Filled 2016-05-06: qty 90

## 2016-05-06 MED ORDER — SODIUM CHLORIDE 0.9 % IV SOLN
Freq: Once | INTRAVENOUS | Status: AC
  Administered 2016-05-06: 15:00:00 via INTRAVENOUS

## 2016-05-06 NOTE — Patient Instructions (Signed)
[facility] Discharge Instructions for Patients Receiving Chemotherapy  Today you received the following monoclonal antibody agents :  Eculizumab  To help prevent nausea and vomiting after your treatment, we encourage you to take your nausea medication.   If you develop nausea and vomiting that is not controlled by your nausea medication, call the clinic.   BELOW ARE SYMPTOMS THAT SHOULD BE REPORTED IMMEDIATELY:  *FEVER GREATER THAN 100.5 F  *CHILLS WITH OR WITHOUT FEVER  NAUSEA AND VOMITING THAT IS NOT CONTROLLED WITH YOUR NAUSEA MEDICATION  *UNUSUAL SHORTNESS OF BREATH  *UNUSUAL BRUISING OR BLEEDING  TENDERNESS IN MOUTH AND THROAT WITH OR WITHOUT PRESENCE OF ULCERS  *URINARY PROBLEMS  *BOWEL PROBLEMS  UNUSUAL RASH Items with * indicate a potential emergency and should be followed up as soon as possible.  Feel free to call the clinic you have any questions or concerns. The clinic phone number is [phone].  Please show the [place] at check-in to the Emergency Department and triage nurse.  Eculizumab injection What is this medicine? ECULIZUMAB (ek yoo LYE zyoo mab) is a monoclonal antibody. It is used to treat a rare kind of anemia called paroxysmal nocturnal hemoglobinuria or PNH. It may help prevent the loss of blood in patients with PNH. This medicine may be used for other purposes; ask your health care provider or pharmacist if you have questions. What should I tell my health care provider before I take this medicine? They need to know if you have any of these conditions: -infection -not received meningococcal vaccine -an unusual or allergic reaction to this eculizumab, other medicines, foods, dyes, or preservatives -pregnant or trying to get pregnant -breast-feeding How should I use this medicine? This medicine is for infusion into a vein. It is given by a health care professional in a hospital or clinic setting. A special MedGuide will  be given to you by the pharmacist with each prescription and refill. Be sure to read this information carefully each time. Talk to your pediatrician regarding the use of this medicine in children. Special care may be needed. Overdosage: If you think you have taken too much of this medicine contact a poison control center or emergency room at once. NOTE: This medicine is only for you. Do not share this medicine with others. What if I miss a dose? It is important not to miss your dose. Call your doctor or health care professional if you are unable to keep an appointment. What may interact with this medicine? Interactions are not expected. This list may not describe all possible interactions. Give your health care provider a list of all the medicines, herbs, non-prescription drugs, or dietary supplements you use. Also tell them if you smoke, drink alcohol, or use illegal drugs. Some items may interact with your medicine. What should I watch for while using this medicine? Your condition will be monitored carefully while you are receiving this medicine. You will need regular blood work. Tell your doctor or healthcare professional if your symptoms do not start to get better or if they get worse. You may have sudden breakdown of your red blood cells after you stop taking this medicine. You will need to be followed by your doctor for 8 weeks or more after this therapy is complete. This medicine may decrease your body's ability to fight infection. Avoid being around people who are sick. Carry the Patient Safety Card given to you at all times. Seek medical help if you have any of  the symptoms listed on the card. What side effects may I notice from receiving this medicine? Side effects that you should report to your doctor or health care professional as soon as possible: -allergic reactions like skin rash, itching or hives, swelling of the face, lips, or tongue -back pain -breathing problems -bruising,  pinpoint red spots on the skin -confusion -eyes sensitive to light -fast, irregular heartbeat -fever, chills, or any other sign of infection -pain, swelling, warmth in the leg -pain, tingling, numbness in the hands or feet -problems with balance, talking, walking -seizures -stiff neck -unusually weak or tired -vomiting Side effects that usually do not require medical attention (report to your doctor or health care professional if they continue or are bothersome): -aches and pains -constipation -headache -nausea -runny nose or colds -sore throat This list may not describe all possible side effects. Call your doctor for medical advice about side effects. You may report side effects to FDA at 1-800-FDA-1088. Where should I keep my medicine? This drug is given in a hospital or clinic and will not be stored at home. NOTE: This sheet is a summary. It may not cover all possible information. If you have questions about this medicine, talk to your doctor, pharmacist, or health care provider.    2016, Elsevier/Gold Standard. (2008-06-12 17:23:14)

## 2016-05-06 NOTE — Progress Notes (Signed)
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   PRIOR THERAPY:  1) Tapered dose of prednisone. 2) High dose of IVIG   CURRENT THERAPY: Soliris (Eculizumab) induction with 600 mg IV weekly 4 weeks followed by maintenance treatment at a dose of 900 mg IV every 2 weeks. First dose of treatment was given on 11/21/2014. Status post 19 cycles.   INTERVAL HISTORY: Travis Mcdaniel 45 y.o. male returns to the clinic today for follow-up visit. The patient is doing fine today with no specific complaints. He is tolerating his treatment with Soliris status post 19 cycles fairly well with no significant adverse effects. He was seen at [facility] stem cell transplant team by [name]. She recommended for him to continue on his current treatment with Soliris. She will consider him for bone marrow transplant if he has bone marrow failure. He denied having any significant fever or chills. He has no nausea or vomiting. He denied having any significant chest pain, shortness of breath, cough or hemoptysis. He has no significant weight loss or night sweats. He is here today for evaluation and repeat blood work before starting cycle #20 of his treatment with Soliris.  MEDICAL HISTORY: Past Medical History:  Diagnosis Date  . Diverticulosis   . Encounter for antineoplastic chemotherapy 01/15/2016  . Hypertension     ALLERGIES:  has No Known Allergies.  MEDICATIONS:  Current Outpatient Prescriptions  Medication Sig Dispense Refill  . ALPRAZolam (XANAX) 1 MG tablet Take 1 mg by mouth 3 (three) times daily.  2  . hydrochlorothiazide (HYDRODIURIL) 25 MG tablet     . Multiple Vitamin (MULTIVITAMIN WITH MINERALS) TABS tablet Take 1 tablet by mouth daily.    . Psyllium (METAMUCIL PO)  Take 1 capsule by mouth daily.     No current facility-administered medications for this visit.     SURGICAL HISTORY: No past surgical history on file.  REVIEW OF SYSTEMS:  A comprehensive review of systems was negative.   PHYSICAL EXAMINATION: General appearance: alert, cooperative and no distress Head: Normocephalic, without obvious abnormality, atraumatic Neck: no adenopathy, no JVD, supple, symmetrical, trachea midline and thyroid not enlarged, symmetric, no tenderness/mass/nodules Lymph nodes: Cervical, supraclavicular, and axillary nodes normal. Resp: clear to auscultation bilaterally Back: symmetric, no curvature. ROM normal. No CVA tenderness. Cardio: regular rate and rhythm, S1, S2 normal, no murmur, click, rub or gallop GI: soft, non-tender; bowel sounds normal; no masses,  no organomegaly Extremities: extremities normal, atraumatic, no cyanosis or edema Neurologic: Alert and oriented X 3, normal strength and tone. Normal symmetric reflexes. Normal coordination and gait  ECOG PERFORMANCE STATUS: 1 - Symptomatic but completely ambulatory  Blood pressure 138/70, pulse 68, temperature 97.9 F (36.6 C), temperature source Oral, resp. rate 18, height 5\' 6"  (1.676 m), weight 176 lb (79.8 kg), SpO2 100 %.  LABORATORY DATA: Lab Results  Component Value Date   WBC 5.3 05/06/2016   HGB 12.5 (L) 05/06/2016   HCT 38.4 05/06/2016   MCV 111.0 (H) 05/06/2016   PLT 143 05/06/2016      Chemistry      Component Value Date/Time   NA 139 04/22/2016 0746   K 4.0 04/22/2016 0746   CL 110 09/07/2014 0407   CO2 23 04/22/2016 0746   BUN 10.2 04/22/2016 0746  CREATININE 0.8 04/22/2016 0746      Component Value Date/Time   CALCIUM 9.2 04/22/2016 0746   ALKPHOS 87 04/22/2016 0746   AST 39 (H) 04/22/2016 0746   ALT 59 (H) 04/22/2016 0746   BILITOT 3.51 (HH) 04/22/2016 0746       RADIOGRAPHIC STUDIES: No results found.  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 He is currently on treatment with Soliris. He is status post 19 cycles of treatment.  The patient is tolerating his treatment fairly well. I recommended for him to continue his treatment as a scheduled.  He will start cycle #20 today. He was recently evaluated by [name] at [facility] stem cell transplant team and she recommended for him to continue his current treatment with Soliris. He would come back for follow-up visit in 4 weeks for reevaluation before starting cycle #21.  The patient will continue to take daily aspirin during his treatment with Soliris. For the hyperbilirubinemia, this is stable and we will continue to monitor it closely. For hypertension, he is currently on hydrochlorothiazide managed by Dr. [name]. The patient was advised to call immediately if he has any concerning symptoms in the interval. The patient voices understanding of current disease status and treatment options and is in agreement with the current care plan.  All questions were answered. The patient knows to call the clinic with any problems, questions or concerns. We can certainly see the patient much sooner if necessary.  Disclaimer: This note was dictated with voice recognition software. Similar sounding words can inadvertently be transcribed and may not be corrected upon review.

## 2016-05-06 NOTE — Telephone Encounter (Signed)
Message sent to chemo scheduler to add chemo per 05/06/16 los. Appointments scheduled per 05/06/16 los. AVS report and appointment schedule given to patient per 05/06/16 los.

## 2016-05-07 ENCOUNTER — Encounter: Payer: Self-pay | Admitting: Internal Medicine

## 2016-05-07 ENCOUNTER — Telehealth: Payer: Self-pay | Admitting: *Deleted

## 2016-05-07 NOTE — Progress Notes (Signed)
Patient brought in letter from [facility] regarding prior authorization for his Soliris infusion. Confirmed with Darlena that it goes to Aleen Sells at [place]. She reviewed it and states that is correct. Scanned and emailed copy of letter to Aleen Sells. Gave patient the original back.

## 2016-05-07 NOTE — Telephone Encounter (Signed)
Per LOS I have scheduled appts and notified the scheduler 

## 2016-05-20 ENCOUNTER — Ambulatory Visit (HOSPITAL_BASED_OUTPATIENT_CLINIC_OR_DEPARTMENT_OTHER): Payer: Commercial Managed Care - HMO

## 2016-05-20 ENCOUNTER — Other Ambulatory Visit (HOSPITAL_BASED_OUTPATIENT_CLINIC_OR_DEPARTMENT_OTHER): Payer: Commercial Managed Care - HMO

## 2016-05-20 VITALS — BP 137/79 | HR 78 | Temp 98.6°F | Resp 12

## 2016-05-20 DIAGNOSIS — D595 Paroxysmal nocturnal hemoglobinuria [Marchiafava-Micheli]: Secondary | ICD-10-CM

## 2016-05-20 DIAGNOSIS — Z5112 Encounter for antineoplastic immunotherapy: Secondary | ICD-10-CM

## 2016-05-20 LAB — COMPREHENSIVE METABOLIC PANEL
ALBUMIN: 4 g/dL (ref 3.5–5.0)
ALK PHOS: 78 U/L (ref 40–150)
ALT: 59 U/L — ABNORMAL HIGH (ref 0–55)
ANION GAP: 12 meq/L — AB (ref 3–11)
AST: 41 U/L — ABNORMAL HIGH (ref 5–34)
BILIRUBIN TOTAL: 4.88 mg/dL — AB (ref 0.20–1.20)
BUN: 11.4 mg/dL (ref 7.0–26.0)
CALCIUM: 9.1 mg/dL (ref 8.4–10.4)
CO2: 21 mEq/L — ABNORMAL LOW (ref 22–29)
Chloride: 105 mEq/L (ref 98–109)
Creatinine: 0.8 mg/dL (ref 0.7–1.3)
Glucose: 101 mg/dl (ref 70–140)
POTASSIUM: 3.8 meq/L (ref 3.5–5.1)
SODIUM: 138 meq/L (ref 136–145)
TOTAL PROTEIN: 6.9 g/dL (ref 6.4–8.3)

## 2016-05-20 LAB — CBC WITH DIFFERENTIAL/PLATELET
BASO%: 0.6 % (ref 0.0–2.0)
BASOS ABS: 0 10*3/uL (ref 0.0–0.1)
EOS ABS: 0.2 10*3/uL (ref 0.0–0.5)
EOS%: 4.5 % (ref 0.0–7.0)
HEMATOCRIT: 38 % — AB (ref 38.4–49.9)
HEMOGLOBIN: 12.6 g/dL — AB (ref 13.0–17.1)
LYMPH%: 20.2 % (ref 14.0–49.0)
MCH: 36.6 pg — AB (ref 27.2–33.4)
MCHC: 33.2 g/dL (ref 32.0–36.0)
MCV: 110.5 fL — AB (ref 79.3–98.0)
MONO#: 0.5 10*3/uL (ref 0.1–0.9)
MONO%: 9.6 % (ref 0.0–14.0)
NEUT#: 3.2 10*3/uL (ref 1.5–6.5)
NEUT%: 65.1 % (ref 39.0–75.0)
PLATELETS: 125 10*3/uL — AB (ref 140–400)
RBC: 3.44 10*6/uL — ABNORMAL LOW (ref 4.20–5.82)
RDW: 15.2 % — AB (ref 11.0–14.6)
WBC: 4.9 10*3/uL (ref 4.0–10.3)
lymph#: 1 10*3/uL (ref 0.9–3.3)

## 2016-05-20 LAB — LACTATE DEHYDROGENASE: LDH: 228 U/L (ref 125–245)

## 2016-05-20 MED ORDER — SODIUM CHLORIDE 0.9 % IV SOLN
900.0000 mg | Freq: Once | INTRAVENOUS | Status: AC
  Administered 2016-05-20: 900 mg via INTRAVENOUS
  Filled 2016-05-20: qty 90

## 2016-05-20 MED ORDER — SODIUM CHLORIDE 0.9 % IJ SOLN
10.0000 mL | INTRAMUSCULAR | Status: DC | PRN
  Filled 2016-05-20: qty 10

## 2016-05-20 MED ORDER — SODIUM CHLORIDE 0.9 % IV SOLN
Freq: Once | INTRAVENOUS | Status: AC
  Administered 2016-05-20: 08:00:00 via INTRAVENOUS

## 2016-05-20 NOTE — Patient Instructions (Signed)
Eculizumab injection What is this medicine? ECULIZUMAB (ek yoo LYE zyoo mab) is a monoclonal antibody. It is used to treat a rare kind of anemia called paroxysmal nocturnal hemoglobinuria or PNH. It may help prevent the loss of blood in patients with PNH. COMMON BRAND NAME(S): Soliris What should I tell my health care provider before I take this medicine? They need to know if you have any of these conditions: -infection -not received meningococcal vaccine -an unusual or allergic reaction to this eculizumab, other medicines, foods, dyes, or preservatives -pregnant or trying to get pregnant -breast-feeding How should I use this medicine? This medicine is for infusion into a vein. It is given by a health care professional in a hospital or clinic setting. A special MedGuide will be given to you by the pharmacist with each prescription and refill. Be sure to read this information carefully each time. Talk to your pediatrician regarding the use of this medicine in children. Special care may be needed. What if I miss a dose? It is important not to miss your dose. Call your doctor or health care professional if you are unable to keep an appointment. What may interact with this medicine? Interactions are not expected. What should I watch for while using this medicine? Your condition will be monitored carefully while you are receiving this medicine. You will need regular blood work. Tell your doctor or healthcare professional if your symptoms do not start to get better or if they get worse. You may have sudden breakdown of your red blood cells after you stop taking this medicine. You will need to be followed by your doctor for 8 weeks or more after this therapy is complete. This medicine may decrease your body's ability to fight infection. Avoid being around people who are sick. Carry the Patient Safety Card given to you at all times. Seek medical help if you have any of the symptoms listed on the  card. What side effects may I notice from receiving this medicine? Side effects that you should report to your doctor or health care professional as soon as possible: -allergic reactions like skin rash, itching or hives, swelling of the face, lips, or tongue -back pain -breathing problems -bruising, pinpoint red spots on the skin -confusion -eyes sensitive to light -fast, irregular heartbeat -fever, chills, or any other sign of infection -pain, swelling, warmth in the leg -pain, tingling, numbness in the hands or feet -problems with balance, talking, walking -seizures -stiff neck -unusually weak or tired -vomiting Side effects that usually do not require medical attention (report to your doctor or health care professional if they continue or are bothersome): -aches and pains -constipation -headache -nausea -runny nose or colds -sore throat Where should I keep my medicine? This drug is given in a hospital or clinic and will not be stored at home.  2017 Elsevier/Gold Standard (2008-06-12 17:23:14)  

## 2016-06-03 ENCOUNTER — Encounter: Payer: Self-pay | Admitting: Internal Medicine

## 2016-06-03 ENCOUNTER — Ambulatory Visit (HOSPITAL_BASED_OUTPATIENT_CLINIC_OR_DEPARTMENT_OTHER): Payer: Commercial Managed Care - HMO

## 2016-06-03 ENCOUNTER — Other Ambulatory Visit (HOSPITAL_BASED_OUTPATIENT_CLINIC_OR_DEPARTMENT_OTHER): Payer: Commercial Managed Care - HMO

## 2016-06-03 ENCOUNTER — Ambulatory Visit (HOSPITAL_BASED_OUTPATIENT_CLINIC_OR_DEPARTMENT_OTHER): Payer: Commercial Managed Care - HMO | Admitting: Internal Medicine

## 2016-06-03 ENCOUNTER — Telehealth: Payer: Self-pay | Admitting: *Deleted

## 2016-06-03 ENCOUNTER — Telehealth: Payer: Self-pay | Admitting: Internal Medicine

## 2016-06-03 VITALS — BP 114/72 | HR 63 | Resp 18

## 2016-06-03 VITALS — BP 138/73 | HR 69 | Temp 97.7°F | Resp 18 | Ht 66.0 in | Wt 175.6 lb

## 2016-06-03 DIAGNOSIS — D696 Thrombocytopenia, unspecified: Secondary | ICD-10-CM | POA: Diagnosis not present

## 2016-06-03 DIAGNOSIS — D649 Anemia, unspecified: Secondary | ICD-10-CM

## 2016-06-03 DIAGNOSIS — D595 Paroxysmal nocturnal hemoglobinuria [Marchiafava-Micheli]: Secondary | ICD-10-CM | POA: Diagnosis not present

## 2016-06-03 DIAGNOSIS — Z5112 Encounter for antineoplastic immunotherapy: Secondary | ICD-10-CM

## 2016-06-03 DIAGNOSIS — Z5111 Encounter for antineoplastic chemotherapy: Secondary | ICD-10-CM

## 2016-06-03 LAB — CBC WITH DIFFERENTIAL/PLATELET
BASO%: 0.6 % (ref 0.0–2.0)
BASOS ABS: 0 10*3/uL (ref 0.0–0.1)
EOS%: 6.1 % (ref 0.0–7.0)
Eosinophils Absolute: 0.3 10*3/uL (ref 0.0–0.5)
HCT: 37.6 % — ABNORMAL LOW (ref 38.4–49.9)
HGB: 12.6 g/dL — ABNORMAL LOW (ref 13.0–17.1)
LYMPH%: 24.7 % (ref 14.0–49.0)
MCH: 37.3 pg — AB (ref 27.2–33.4)
MCHC: 33.5 g/dL (ref 32.0–36.0)
MCV: 111.2 fL — ABNORMAL HIGH (ref 79.3–98.0)
MONO#: 0.5 10*3/uL (ref 0.1–0.9)
MONO%: 9.2 % (ref 0.0–14.0)
NEUT#: 2.9 10*3/uL (ref 1.5–6.5)
NEUT%: 59.4 % (ref 39.0–75.0)
Platelets: 135 10*3/uL — ABNORMAL LOW (ref 140–400)
RBC: 3.38 10*6/uL — AB (ref 4.20–5.82)
RDW: 15.3 % — ABNORMAL HIGH (ref 11.0–14.6)
WBC: 4.9 10*3/uL (ref 4.0–10.3)
lymph#: 1.2 10*3/uL (ref 0.9–3.3)

## 2016-06-03 LAB — COMPREHENSIVE METABOLIC PANEL
ALT: 59 U/L — AB (ref 0–55)
AST: 42 U/L — AB (ref 5–34)
Albumin: 4.1 g/dL (ref 3.5–5.0)
Alkaline Phosphatase: 72 U/L (ref 40–150)
Anion Gap: 10 mEq/L (ref 3–11)
BUN: 10.2 mg/dL (ref 7.0–26.0)
CALCIUM: 9.2 mg/dL (ref 8.4–10.4)
CHLORIDE: 106 meq/L (ref 98–109)
CO2: 23 meq/L (ref 22–29)
CREATININE: 0.8 mg/dL (ref 0.7–1.3)
EGFR: >90 mL/min/{1.73_m2} (ref >90)
Glucose: 94 mg/dl (ref 70–140)
POTASSIUM: 3.9 meq/L (ref 3.5–5.1)
Sodium: 138 mEq/L (ref 136–145)
Total Bilirubin: 3.34 mg/dL — ABNORMAL HIGH (ref 0.20–1.20)
Total Protein: 7.1 g/dL (ref 6.4–8.3)

## 2016-06-03 LAB — LACTATE DEHYDROGENASE: LDH: 250 U/L — AB (ref 125–245)

## 2016-06-03 MED ORDER — SODIUM CHLORIDE 0.9 % IV SOLN
Freq: Once | INTRAVENOUS | Status: AC
  Administered 2016-06-03: 10:00:00 via INTRAVENOUS

## 2016-06-03 MED ORDER — SODIUM CHLORIDE 0.9 % IV SOLN
900.0000 mg | Freq: Once | INTRAVENOUS | Status: AC
  Administered 2016-06-03: 900 mg via INTRAVENOUS
  Filled 2016-06-03: qty 90

## 2016-06-03 NOTE — Progress Notes (Signed)
Patient reported feeling fine and declined to stay for the one hour observation.

## 2016-06-03 NOTE — Telephone Encounter (Signed)
Appointments scheduled per 12/6 LOS. Patient given AVS report and calendars with future scheduled appointments. °

## 2016-06-03 NOTE — Patient Instructions (Signed)
[facility] Discharge Instructions for Patients Receiving Chemotherapy  Today you received the following chemotherapy agents:  Soliris.  To help prevent nausea and vomiting after your treatment, we encourage you to take your nausea medication as directed.   If you develop nausea and vomiting that is not controlled by your nausea medication, call the clinic.   BELOW ARE SYMPTOMS THAT SHOULD BE REPORTED IMMEDIATELY:  *FEVER GREATER THAN 100.5 F  *CHILLS WITH OR WITHOUT FEVER  NAUSEA AND VOMITING THAT IS NOT CONTROLLED WITH YOUR NAUSEA MEDICATION  *UNUSUAL SHORTNESS OF BREATH  *UNUSUAL BRUISING OR BLEEDING  TENDERNESS IN MOUTH AND THROAT WITH OR WITHOUT PRESENCE OF ULCERS  *URINARY PROBLEMS  *BOWEL PROBLEMS  UNUSUAL RASH Items with * indicate a potential emergency and should be followed up as soon as possible.  Feel free to call the clinic you have any questions or concerns. The clinic phone number is [phone].  Please show the CHEMO ALERT CARD at check-in to the Emergency Department and triage nurse.   

## 2016-06-03 NOTE — Progress Notes (Signed)
[place] [phone]   [phone]) [phone]  OFFICE PROGRESS NOTE  No PCP Per Patient No address on file  DIAGNOSIS: Paroxysmal nocturnal hemoglobinuria presented initially as hemolytic anemia diagnosed in June 2016.  PRIOR THERAPY: The patient was previously treated with a tapering dose of prednisone and high-dose IVIG for the hemolytic anemia before his diagnosis of paroxysmal nocturnal hemoglobinuria.  CURRENT THERAPY: Soliris (Eculizumab) initially with induction 600 MG IV weekly for 4 weeks and currently on maintenance treatment 900 MG IV every 2 weeks. First dose of treatment was given on 11/21/2014. Status post 20 cycles.  INTERVAL HISTORY: Travis Mcdaniel 45 y.o. male returns to the clinic today for follow-up visit. The patient is currently on treatment with Soliris for PNH. He has been tolerating the treatment well with no specific complaints except for occasional fatigue. He denied having any chest pain or shortness breath. He has runny nose at times and he takes Claritin as needed. His bilirubin was also elevated 2 weeks ago and the patient is working on reducing his alcohol intake. He has been on daily prophylactic aspirin that he stopped it 2 weeks ago when his bilirubin went up. He is here today for evaluation before starting cycle #21 of his treatment.  MEDICAL HISTORY: Past Medical History:  Diagnosis Date  . Diverticulosis   . Encounter for antineoplastic chemotherapy 01/15/2016  . Hypertension     ALLERGIES:  has No Known Allergies.  MEDICATIONS:  Current Outpatient Prescriptions  Medication Sig Dispense Refill  . ALPRAZolam (XANAX) 1 MG tablet Take 1 mg by mouth 3 (three) times daily.  2  . hydrochlorothiazide (HYDRODIURIL) 25 MG tablet     . Multiple Vitamin (MULTIVITAMIN WITH MINERALS) TABS tablet Take 1 tablet by mouth daily.    . Psyllium (METAMUCIL PO) Take 1 capsule by mouth daily.     No current facility-administered  medications for this visit.     SURGICAL HISTORY: No past surgical history on file.  REVIEW OF SYSTEMS:  A comprehensive review of systems was negative except for: Constitutional: positive for fatigue   PHYSICAL EXAMINATION: General appearance: alert, cooperative and no distress Head: Normocephalic, without obvious abnormality, atraumatic Neck: no adenopathy, no JVD, supple, symmetrical, trachea midline and thyroid not enlarged, symmetric, no tenderness/mass/nodules Lymph nodes: Cervical, supraclavicular, and axillary nodes normal. Resp: clear to auscultation bilaterally Back: symmetric, no curvature. ROM normal. No CVA tenderness. Cardio: regular rate and rhythm, S1, S2 normal, no murmur, click, rub or gallop GI: soft, non-tender; bowel sounds normal; no masses,  no organomegaly Extremities: extremities normal, atraumatic, no cyanosis or edema  ECOG PERFORMANCE STATUS: 1 - Symptomatic but completely ambulatory  Blood pressure 138/73, pulse 69, temperature 97.7 F (36.5 C), temperature source Oral, resp. rate 18, height 5\' 6"  (1.676 m), weight 175 lb 9.6 oz (79.7 kg), SpO2 100 %.  LABORATORY DATA: Lab Results  Component Value Date   WBC 4.9 06/03/2016   HGB 12.6 (L) 06/03/2016   HCT 37.6 (L) 06/03/2016   MCV 111.2 (H) 06/03/2016   PLT 135 (L) 06/03/2016      Chemistry      Component Value Date/Time   NA 138 05/20/2016 0747   K 3.8 05/20/2016 0747   CL 110 09/07/2014 0407   CO2 21 (L) 05/20/2016 0747   BUN 11.4 05/20/2016 0747   CREATININE 0.8 05/20/2016 0747      Component Value Date/Time   CALCIUM 9.1 05/20/2016 0747   ALKPHOS 78 05/20/2016  0747   AST 41 (H) 05/20/2016 0747   ALT 59 (H) 05/20/2016 0747   BILITOT 4.88 (HH) 05/20/2016 0747       RADIOGRAPHIC STUDIES: No results found.  ASSESSMENT AND PLAN: This is a very pleasant 45 years old white male with PNH (paroxysmal nocturnal hemoglobinuria) currently on treatment with Soliris. The patient is tolerating  the treatment well with no concerning adverse effect except for fatigue and hyperbilirubinemia. CBC is unremarkable today except for mild anemia and thrombocytopenia. Comprehensive metabolic panel and LDH are still pending. I recommended for him to proceed with cycle #21 today as scheduled. I also advised him to resume the prophylactic dose of aspirin. He would come back for follow-up visit in 4 weeks with the start of cycle #22. He was advised to call immediately if he has any concerning symptoms in the interval. The patient voices understanding of current disease status and treatment options and is in agreement with the current care plan.  All questions were answered. The patient knows to call the clinic with any problems, questions or concerns. We can certainly see the patient much sooner if necessary.  Disclaimer: This note was dictated with voice recognition software. Similar sounding words can inadvertently be transcribed and may not be corrected upon review.

## 2016-06-03 NOTE — Telephone Encounter (Signed)
Per LOS I have scheduled appts and notified the scheduler 

## 2016-06-17 ENCOUNTER — Ambulatory Visit (HOSPITAL_BASED_OUTPATIENT_CLINIC_OR_DEPARTMENT_OTHER): Payer: Commercial Managed Care - HMO

## 2016-06-17 ENCOUNTER — Other Ambulatory Visit (HOSPITAL_BASED_OUTPATIENT_CLINIC_OR_DEPARTMENT_OTHER): Payer: Commercial Managed Care - HMO

## 2016-06-17 VITALS — BP 140/81 | HR 70 | Temp 98.1°F | Resp 18

## 2016-06-17 DIAGNOSIS — D595 Paroxysmal nocturnal hemoglobinuria [Marchiafava-Micheli]: Secondary | ICD-10-CM

## 2016-06-17 DIAGNOSIS — Z5112 Encounter for antineoplastic immunotherapy: Secondary | ICD-10-CM

## 2016-06-17 LAB — LACTATE DEHYDROGENASE: LDH: 224 U/L (ref 125–245)

## 2016-06-17 LAB — CBC WITH DIFFERENTIAL/PLATELET
BASO%: 0.2 % (ref 0.0–2.0)
BASOS ABS: 0 10*3/uL (ref 0.0–0.1)
EOS ABS: 0.3 10*3/uL (ref 0.0–0.5)
EOS%: 5.4 % (ref 0.0–7.0)
HCT: 39.4 % (ref 38.4–49.9)
HEMOGLOBIN: 12.9 g/dL — AB (ref 13.0–17.1)
LYMPH%: 25.9 % (ref 14.0–49.0)
MCH: 36.3 pg — AB (ref 27.2–33.4)
MCHC: 32.7 g/dL (ref 32.0–36.0)
MCV: 111 fL — AB (ref 79.3–98.0)
MONO#: 0.3 10*3/uL (ref 0.1–0.9)
MONO%: 6.8 % (ref 0.0–14.0)
NEUT#: 2.8 10*3/uL (ref 1.5–6.5)
NEUT%: 61.7 % (ref 39.0–75.0)
PLATELETS: 128 10*3/uL — AB (ref 140–400)
RBC: 3.55 10*6/uL — ABNORMAL LOW (ref 4.20–5.82)
RDW: 15.2 % — AB (ref 11.0–14.6)
WBC: 4.6 10*3/uL (ref 4.0–10.3)
lymph#: 1.2 10*3/uL (ref 0.9–3.3)

## 2016-06-17 LAB — COMPREHENSIVE METABOLIC PANEL
ALBUMIN: 4.1 g/dL (ref 3.5–5.0)
ALK PHOS: 70 U/L (ref 40–150)
ALT: 56 U/L — ABNORMAL HIGH (ref 0–55)
ANION GAP: 9 meq/L (ref 3–11)
AST: 40 U/L — ABNORMAL HIGH (ref 5–34)
BILIRUBIN TOTAL: 2.9 mg/dL — AB (ref 0.20–1.20)
BUN: 10.9 mg/dL (ref 7.0–26.0)
CALCIUM: 9.1 mg/dL (ref 8.4–10.4)
CO2: 23 mEq/L (ref 22–29)
Chloride: 106 mEq/L (ref 98–109)
Creatinine: 0.8 mg/dL (ref 0.7–1.3)
GLUCOSE: 96 mg/dL (ref 70–140)
POTASSIUM: 3.9 meq/L (ref 3.5–5.1)
SODIUM: 139 meq/L (ref 136–145)
Total Protein: 7.1 g/dL (ref 6.4–8.3)

## 2016-06-17 MED ORDER — SODIUM CHLORIDE 0.9 % IV SOLN
Freq: Once | INTRAVENOUS | Status: AC
  Administered 2016-06-17: 09:00:00 via INTRAVENOUS

## 2016-06-17 MED ORDER — SODIUM CHLORIDE 0.9 % IV SOLN
900.0000 mg | Freq: Once | INTRAVENOUS | Status: AC
  Administered 2016-06-17: 900 mg via INTRAVENOUS
  Filled 2016-06-17: qty 90

## 2016-06-17 NOTE — Patient Instructions (Signed)
[facility] Discharge Instructions for Patients Receiving Chemotherapy  Today you received the following chemotherapy agents solaris   To help prevent nausea and vomiting after your treatment, we encourage you to take your nausea medication as directed   If you develop nausea and vomiting that is not controlled by your nausea medication, call the clinic.   BELOW ARE SYMPTOMS THAT SHOULD BE REPORTED IMMEDIATELY:  *FEVER GREATER THAN 100.5 F  *CHILLS WITH OR WITHOUT FEVER  NAUSEA AND VOMITING THAT IS NOT CONTROLLED WITH YOUR NAUSEA MEDICATION  *UNUSUAL SHORTNESS OF BREATH  *UNUSUAL BRUISING OR BLEEDING  TENDERNESS IN MOUTH AND THROAT WITH OR WITHOUT PRESENCE OF ULCERS  *URINARY PROBLEMS  *BOWEL PROBLEMS  UNUSUAL RASH Items with * indicate a potential emergency and should be followed up as soon as possible.  Feel free to call the clinic you have any questions or concerns. The clinic phone number is [phone].  

## 2016-06-26 ENCOUNTER — Encounter: Payer: Self-pay | Admitting: Internal Medicine

## 2016-06-26 NOTE — Progress Notes (Signed)
Patient came in to bring new insurance card effective 06/29/2016. Scanned under insurance [id number].

## 2016-06-26 NOTE — Progress Notes (Signed)
Emailed copy of new card to [employer] for authorization. She states she had been waiting for this information and will work on it Tuesday morning. Called patient and left a message to advise patient. Patient returned my call and I advised him of this update. Patient was very Patent attorney.

## 2016-07-01 ENCOUNTER — Encounter: Payer: Self-pay | Admitting: Medical Oncology

## 2016-07-01 ENCOUNTER — Ambulatory Visit (HOSPITAL_BASED_OUTPATIENT_CLINIC_OR_DEPARTMENT_OTHER): Payer: BLUE CROSS/BLUE SHIELD

## 2016-07-01 ENCOUNTER — Other Ambulatory Visit (HOSPITAL_BASED_OUTPATIENT_CLINIC_OR_DEPARTMENT_OTHER): Payer: BLUE CROSS/BLUE SHIELD

## 2016-07-01 ENCOUNTER — Encounter: Payer: Self-pay | Admitting: Internal Medicine

## 2016-07-01 ENCOUNTER — Ambulatory Visit (HOSPITAL_BASED_OUTPATIENT_CLINIC_OR_DEPARTMENT_OTHER): Payer: BLUE CROSS/BLUE SHIELD | Admitting: Internal Medicine

## 2016-07-01 VITALS — BP 130/67 | HR 87 | Temp 98.7°F | Resp 18 | Wt 174.2 lb

## 2016-07-01 DIAGNOSIS — D595 Paroxysmal nocturnal hemoglobinuria [Marchiafava-Micheli]: Secondary | ICD-10-CM | POA: Diagnosis not present

## 2016-07-01 DIAGNOSIS — Z5112 Encounter for antineoplastic immunotherapy: Secondary | ICD-10-CM | POA: Diagnosis not present

## 2016-07-01 DIAGNOSIS — Z5111 Encounter for antineoplastic chemotherapy: Secondary | ICD-10-CM

## 2016-07-01 LAB — COMPREHENSIVE METABOLIC PANEL
ALT: 64 U/L — ABNORMAL HIGH (ref 0–55)
ANION GAP: 11 meq/L (ref 3–11)
AST: 44 U/L — ABNORMAL HIGH (ref 5–34)
Albumin: 4.5 g/dL (ref 3.5–5.0)
Alkaline Phosphatase: 84 U/L (ref 40–150)
BILIRUBIN TOTAL: 3.62 mg/dL — AB (ref 0.20–1.20)
BUN: 14.3 mg/dL (ref 7.0–26.0)
CO2: 23 meq/L (ref 22–29)
Calcium: 9.5 mg/dL (ref 8.4–10.4)
Chloride: 105 mEq/L (ref 98–109)
Creatinine: 0.8 mg/dL (ref 0.7–1.3)
GLUCOSE: 95 mg/dL (ref 70–140)
Potassium: 3.8 mEq/L (ref 3.5–5.1)
SODIUM: 139 meq/L (ref 136–145)
TOTAL PROTEIN: 7.4 g/dL (ref 6.4–8.3)

## 2016-07-01 LAB — CBC WITH DIFFERENTIAL/PLATELET
BASO%: 0.4 % (ref 0.0–2.0)
Basophils Absolute: 0 10*3/uL (ref 0.0–0.1)
EOS ABS: 0.3 10*3/uL (ref 0.0–0.5)
EOS%: 4.9 % (ref 0.0–7.0)
HCT: 40.6 % (ref 38.4–49.9)
HGB: 13.6 g/dL (ref 13.0–17.1)
LYMPH%: 23.9 % (ref 14.0–49.0)
MCH: 37.2 pg — AB (ref 27.2–33.4)
MCHC: 33.5 g/dL (ref 32.0–36.0)
MCV: 110.9 fL — AB (ref 79.3–98.0)
MONO#: 0.5 10*3/uL (ref 0.1–0.9)
MONO%: 8.7 % (ref 0.0–14.0)
NEUT%: 62.1 % (ref 39.0–75.0)
NEUTROS ABS: 3.3 10*3/uL (ref 1.5–6.5)
PLATELETS: 133 10*3/uL — AB (ref 140–400)
RBC: 3.66 10*6/uL — AB (ref 4.20–5.82)
RDW: 15.4 % — ABNORMAL HIGH (ref 11.0–14.6)
WBC: 5.3 10*3/uL (ref 4.0–10.3)
lymph#: 1.3 10*3/uL (ref 0.9–3.3)

## 2016-07-01 LAB — LACTATE DEHYDROGENASE: LDH: 223 U/L (ref 125–245)

## 2016-07-01 MED ORDER — SODIUM CHLORIDE 0.9 % IV SOLN
Freq: Once | INTRAVENOUS | Status: AC
  Administered 2016-07-01: 11:00:00 via INTRAVENOUS

## 2016-07-01 MED ORDER — SODIUM CHLORIDE 0.9 % IV SOLN
900.0000 mg | Freq: Once | INTRAVENOUS | Status: AC
  Administered 2016-07-01: 900 mg via INTRAVENOUS
  Filled 2016-07-01: qty 90

## 2016-07-01 NOTE — Progress Notes (Signed)
Per Julien Nordmann it is okay to treat pt today with elevated bilirubin and solaris.

## 2016-07-01 NOTE — Patient Instructions (Signed)
[facility] Discharge Instructions for Patients Receiving Chemotherapy  Today you received the following chemotherapy agents solaris   To help prevent nausea and vomiting after your treatment, we encourage you to take your nausea medication as directed   If you develop nausea and vomiting that is not controlled by your nausea medication, call the clinic.   BELOW ARE SYMPTOMS THAT SHOULD BE REPORTED IMMEDIATELY:  *FEVER GREATER THAN 100.5 F  *CHILLS WITH OR WITHOUT FEVER  NAUSEA AND VOMITING THAT IS NOT CONTROLLED WITH YOUR NAUSEA MEDICATION  *UNUSUAL SHORTNESS OF BREATH  *UNUSUAL BRUISING OR BLEEDING  TENDERNESS IN MOUTH AND THROAT WITH OR WITHOUT PRESENCE OF ULCERS  *URINARY PROBLEMS  *BOWEL PROBLEMS  UNUSUAL RASH Items with * indicate a potential emergency and should be followed up as soon as possible.  Feel free to call the clinic you have any questions or concerns. The clinic phone number is [phone].  

## 2016-07-01 NOTE — Progress Notes (Signed)
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  PRIOR THERAPY: The patient was previously treated with a tapering dose of prednisone and high-dose IVIG for the hemolytic anemia before his diagnosis of paroxysmal nocturnal hemoglobinuria.  CURRENT THERAPY: Soliris (Eculizumab) initially with induction 600 MG IV weekly for 4 weeks and currently on maintenance treatment 900 MG IV every 2 weeks. First dose of treatment was given on 11/21/2014. Status post 21 cycles.  INTERVAL HISTORY: Travis Mcdaniel 46 y.o. male came to the clinic today for follow-up visit. The patient is feeling fine with no specific complaints. He enjoyed his Christmas and new year holiday. Unfortunately he lost his job starting 2018. He is looking for another job. He denied having any significant chest pain, shortness of breath, cough or hemoptysis. He has no weight loss or night sweats. He denied having any fever, chills, nausea or vomiting. He is tolerating his treatment with Soliris fairly well. He is here today for evaluation before starting cycle #22.  MEDICAL HISTORY: Past Medical History:  Diagnosis Date  . Diverticulosis   . Encounter for antineoplastic chemotherapy 01/15/2016  . Hypertension     ALLERGIES:  has No Known Allergies.  MEDICATIONS:  Current Outpatient Prescriptions  Medication Sig Dispense Refill  . ALPRAZolam (XANAX) 1 MG tablet Take 1 mg by mouth 3 (three) times daily.  2  . aspirin EC 81 MG tablet Take 81 mg by mouth.    . hydrochlorothiazide (HYDRODIURIL) 25 MG tablet     . Multiple Vitamin (MULTIVITAMIN WITH MINERALS) TABS tablet Take 1 tablet by mouth daily.    . Psyllium (METAMUCIL PO) Take 1 capsule by mouth daily.    . Psyllium Husk 100 % POWD Take by mouth.    . Vitamins/Minerals  TABS Take by mouth.     No current facility-administered medications for this visit.     SURGICAL HISTORY: No past surgical history on file.  REVIEW OF SYSTEMS:  A comprehensive review of systems was negative.   PHYSICAL EXAMINATION: General appearance: alert, cooperative and no distress Head: Normocephalic, without obvious abnormality, atraumatic Neck: no adenopathy, no JVD, supple, symmetrical, trachea midline and thyroid not enlarged, symmetric, no tenderness/mass/nodules Lymph nodes: Cervical, supraclavicular, and axillary nodes normal. Resp: clear to auscultation bilaterally Back: symmetric, no curvature. ROM normal. No CVA tenderness. Cardio: regular rate and rhythm, S1, S2 normal, no murmur, click, rub or gallop GI: soft, non-tender; bowel sounds normal; no masses,  no organomegaly Extremities: extremities normal, atraumatic, no cyanosis or edema  ECOG PERFORMANCE STATUS: 0 - Asymptomatic  Blood pressure 130/67, pulse 87, temperature 98.7 F (37.1 C), temperature source Oral, resp. rate 18, weight 174 lb 3.2 oz (79 kg), SpO2 100 %.  LABORATORY DATA: Lab Results  Component Value Date   WBC 5.3 07/01/2016   HGB 13.6 07/01/2016   HCT 40.6 07/01/2016   MCV 110.9 (H) 07/01/2016   PLT 133 (L) 07/01/2016      Chemistry      Component Value Date/Time   NA 139 06/17/2016 0805   K 3.9 06/17/2016 0805   CL 110 09/07/2014 0407   CO2 23 06/17/2016 0805   BUN 10.9 06/17/2016 0805   CREATININE 0.8 06/17/2016 0805      Component Value Date/Time   CALCIUM 9.1 06/17/2016 0805   ALKPHOS 70  06/17/2016 0805   AST 40 (H) 06/17/2016 0805   ALT 56 (H) 06/17/2016 0805   BILITOT 2.90 (H) 06/17/2016 0805       RADIOGRAPHIC STUDIES: No results found.  ASSESSMENT AND PLAN:  This is a very pleasant 46 years old white male with paroxysmal nocturnal hemoglobinuria (PNH). The patient is currently on treatment with Soliris status post 21 cycles. He is tolerating his treatment well  with no significant adverse effects. I recommended for him to proceed with cycle #22 today as a scheduled. I will see him back for follow-up visit in 4 weeks for evaluation before starting cycle #23 of his treatment. He was advised to call immediately if he has any concerning symptoms in the interval. The patient voices understanding of current disease status and treatment options and is in agreement with the current care plan.  All questions were answered. The patient knows to call the clinic with any problems, questions or concerns. We can certainly see the patient much sooner if necessary. I spent 10 minutes counseling the patient face to face. The total time spent in the appointment was 15 minutes. Disclaimer: This note was dictated with voice recognition software. Similar sounding words can inadvertently be transcribed and may not be corrected upon review.

## 2016-07-15 ENCOUNTER — Ambulatory Visit (HOSPITAL_BASED_OUTPATIENT_CLINIC_OR_DEPARTMENT_OTHER): Payer: BLUE CROSS/BLUE SHIELD

## 2016-07-15 ENCOUNTER — Other Ambulatory Visit (HOSPITAL_BASED_OUTPATIENT_CLINIC_OR_DEPARTMENT_OTHER): Payer: BLUE CROSS/BLUE SHIELD

## 2016-07-15 VITALS — BP 131/74 | HR 71 | Temp 98.1°F | Resp 18

## 2016-07-15 DIAGNOSIS — D595 Paroxysmal nocturnal hemoglobinuria [Marchiafava-Micheli]: Secondary | ICD-10-CM

## 2016-07-15 DIAGNOSIS — Z5112 Encounter for antineoplastic immunotherapy: Secondary | ICD-10-CM | POA: Diagnosis not present

## 2016-07-15 LAB — COMPREHENSIVE METABOLIC PANEL
ALT: 83 U/L — AB (ref 0–55)
ANION GAP: 11 meq/L (ref 3–11)
AST: 50 U/L — ABNORMAL HIGH (ref 5–34)
Albumin: 4.1 g/dL (ref 3.5–5.0)
Alkaline Phosphatase: 73 U/L (ref 40–150)
BUN: 13.6 mg/dL (ref 7.0–26.0)
CHLORIDE: 105 meq/L (ref 98–109)
CO2: 23 meq/L (ref 22–29)
Calcium: 9.2 mg/dL (ref 8.4–10.4)
Creatinine: 0.8 mg/dL (ref 0.7–1.3)
Glucose: 88 mg/dl (ref 70–140)
Potassium: 3.9 mEq/L (ref 3.5–5.1)
Sodium: 139 mEq/L (ref 136–145)
Total Bilirubin: 3.25 mg/dL — ABNORMAL HIGH (ref 0.20–1.20)
Total Protein: 7 g/dL (ref 6.4–8.3)

## 2016-07-15 LAB — CBC WITH DIFFERENTIAL/PLATELET
BASO%: 0.6 % (ref 0.0–2.0)
Basophils Absolute: 0 10*3/uL (ref 0.0–0.1)
EOS%: 4.6 % (ref 0.0–7.0)
Eosinophils Absolute: 0.3 10*3/uL (ref 0.0–0.5)
HCT: 38.2 % — ABNORMAL LOW (ref 38.4–49.9)
HGB: 12.7 g/dL — ABNORMAL LOW (ref 13.0–17.1)
LYMPH%: 25.6 % (ref 14.0–49.0)
MCH: 37.8 pg — ABNORMAL HIGH (ref 27.2–33.4)
MCHC: 33.2 g/dL (ref 32.0–36.0)
MCV: 113.7 fL — AB (ref 79.3–98.0)
MONO#: 0.5 10*3/uL (ref 0.1–0.9)
MONO%: 8.8 % (ref 0.0–14.0)
NEUT#: 3.3 10*3/uL (ref 1.5–6.5)
NEUT%: 60.4 % (ref 39.0–75.0)
PLATELETS: 132 10*3/uL — AB (ref 140–400)
RBC: 3.36 10*6/uL — AB (ref 4.20–5.82)
RDW: 16.7 % — ABNORMAL HIGH (ref 11.0–14.6)
WBC: 5.4 10*3/uL (ref 4.0–10.3)
lymph#: 1.4 10*3/uL (ref 0.9–3.3)

## 2016-07-15 LAB — LACTATE DEHYDROGENASE: LDH: 261 U/L — AB (ref 125–245)

## 2016-07-15 MED ORDER — SODIUM CHLORIDE 0.9 % IV SOLN
Freq: Once | INTRAVENOUS | Status: AC
  Administered 2016-07-15: 08:00:00 via INTRAVENOUS

## 2016-07-15 MED ORDER — ECULIZUMAB 300 MG/30ML IV SOLN
900.0000 mg | Freq: Once | INTRAVENOUS | Status: AC
  Administered 2016-07-15: 900 mg via INTRAVENOUS
  Filled 2016-07-15: qty 90

## 2016-07-15 NOTE — Patient Instructions (Signed)
[facility] Discharge Instructions for Patients Receiving Chemotherapy  Today you received the following chemotherapy agents:  Eculizumab (Soliris).  To help prevent nausea and vomiting after your treatment, we encourage you to take your nausea medication as directed.   If you develop nausea and vomiting that is not controlled by your nausea medication, call the clinic.   BELOW ARE SYMPTOMS THAT SHOULD BE REPORTED IMMEDIATELY:  *FEVER GREATER THAN 100.5 F  *CHILLS WITH OR WITHOUT FEVER  NAUSEA AND VOMITING THAT IS NOT CONTROLLED WITH YOUR NAUSEA MEDICATION  *UNUSUAL SHORTNESS OF BREATH  *UNUSUAL BRUISING OR BLEEDING  TENDERNESS IN MOUTH AND THROAT WITH OR WITHOUT PRESENCE OF ULCERS  *URINARY PROBLEMS  *BOWEL PROBLEMS  UNUSUAL RASH Items with * indicate a potential emergency and should be followed up as soon as possible.  Feel free to call the clinic you have any questions or concerns. The clinic phone number is [phone].  Please show the [facility] at check-in to the Emergency Department and triage nurse.

## 2016-07-15 NOTE — Progress Notes (Signed)
Patient discharged in stable condition, VS WNL and documented. Patient refused to wait the hour for observation.

## 2016-07-29 ENCOUNTER — Telehealth: Payer: Self-pay | Admitting: Internal Medicine

## 2016-07-29 ENCOUNTER — Ambulatory Visit (HOSPITAL_BASED_OUTPATIENT_CLINIC_OR_DEPARTMENT_OTHER): Payer: BLUE CROSS/BLUE SHIELD | Admitting: Internal Medicine

## 2016-07-29 ENCOUNTER — Encounter: Payer: Self-pay | Admitting: Internal Medicine

## 2016-07-29 ENCOUNTER — Other Ambulatory Visit (HOSPITAL_BASED_OUTPATIENT_CLINIC_OR_DEPARTMENT_OTHER): Payer: BLUE CROSS/BLUE SHIELD

## 2016-07-29 ENCOUNTER — Ambulatory Visit (HOSPITAL_BASED_OUTPATIENT_CLINIC_OR_DEPARTMENT_OTHER): Payer: BLUE CROSS/BLUE SHIELD

## 2016-07-29 VITALS — BP 113/71 | HR 80 | Temp 98.2°F | Resp 18 | Ht 66.0 in | Wt 174.3 lb

## 2016-07-29 DIAGNOSIS — F1020 Alcohol dependence, uncomplicated: Secondary | ICD-10-CM

## 2016-07-29 DIAGNOSIS — D595 Paroxysmal nocturnal hemoglobinuria [Marchiafava-Micheli]: Secondary | ICD-10-CM

## 2016-07-29 DIAGNOSIS — Z5111 Encounter for antineoplastic chemotherapy: Secondary | ICD-10-CM

## 2016-07-29 DIAGNOSIS — Z5112 Encounter for antineoplastic immunotherapy: Secondary | ICD-10-CM | POA: Diagnosis not present

## 2016-07-29 LAB — CBC WITH DIFFERENTIAL/PLATELET
BASO%: 0.5 % (ref 0.0–2.0)
Basophils Absolute: 0 10*3/uL (ref 0.0–0.1)
EOS ABS: 0.2 10*3/uL (ref 0.0–0.5)
EOS%: 2.4 % (ref 0.0–7.0)
HCT: 37 % — ABNORMAL LOW (ref 38.4–49.9)
HEMOGLOBIN: 12.7 g/dL — AB (ref 13.0–17.1)
LYMPH%: 19.9 % (ref 14.0–49.0)
MCH: 38.6 pg — AB (ref 27.2–33.4)
MCHC: 34.3 g/dL (ref 32.0–36.0)
MCV: 112.7 fL — AB (ref 79.3–98.0)
MONO#: 0.5 10*3/uL (ref 0.1–0.9)
MONO%: 8.2 % (ref 0.0–14.0)
NEUT%: 69 % (ref 39.0–75.0)
NEUTROS ABS: 4.4 10*3/uL (ref 1.5–6.5)
Platelets: 157 10*3/uL (ref 140–400)
RBC: 3.28 10*6/uL — AB (ref 4.20–5.82)
RDW: 15.7 % — AB (ref 11.0–14.6)
WBC: 6.4 10*3/uL (ref 4.0–10.3)
lymph#: 1.3 10*3/uL (ref 0.9–3.3)

## 2016-07-29 LAB — COMPREHENSIVE METABOLIC PANEL
ALT: 139 U/L — ABNORMAL HIGH (ref 0–55)
AST: 103 U/L — AB (ref 5–34)
Albumin: 4.5 g/dL (ref 3.5–5.0)
Alkaline Phosphatase: 83 U/L (ref 40–150)
Anion Gap: 10 mEq/L (ref 3–11)
BUN: 11.8 mg/dL (ref 7.0–26.0)
CO2: 23 meq/L (ref 22–29)
Calcium: 9.4 mg/dL (ref 8.4–10.4)
Chloride: 103 mEq/L (ref 98–109)
Creatinine: 0.8 mg/dL (ref 0.7–1.3)
GLUCOSE: 92 mg/dL (ref 70–140)
POTASSIUM: 4.2 meq/L (ref 3.5–5.1)
SODIUM: 136 meq/L (ref 136–145)
TOTAL PROTEIN: 7.2 g/dL (ref 6.4–8.3)
Total Bilirubin: 3.4 mg/dL — ABNORMAL HIGH (ref 0.20–1.20)

## 2016-07-29 LAB — LACTATE DEHYDROGENASE: LDH: 287 U/L — ABNORMAL HIGH (ref 125–245)

## 2016-07-29 MED ORDER — SODIUM CHLORIDE 0.9 % IV SOLN
900.0000 mg | Freq: Once | INTRAVENOUS | Status: AC
  Administered 2016-07-29: 900 mg via INTRAVENOUS
  Filled 2016-07-29: qty 90

## 2016-07-29 MED ORDER — SODIUM CHLORIDE 0.9 % IV SOLN
Freq: Once | INTRAVENOUS | Status: AC
  Administered 2016-07-29: 13:00:00 via INTRAVENOUS

## 2016-07-29 NOTE — Telephone Encounter (Signed)
Pt has scheduled appt and confirmed them

## 2016-07-29 NOTE — Patient Instructions (Signed)
[facility] Discharge Instructions for Patients Receiving Chemotherapy  Today you received the following chemotherapy agents:  Eculizumab (Soliris).  To help prevent nausea and vomiting after your treatment, we encourage you to take your nausea medication as directed.   If you develop nausea and vomiting that is not controlled by your nausea medication, call the clinic.   BELOW ARE SYMPTOMS THAT SHOULD BE REPORTED IMMEDIATELY:  *FEVER GREATER THAN 100.5 F  *CHILLS WITH OR WITHOUT FEVER  NAUSEA AND VOMITING THAT IS NOT CONTROLLED WITH YOUR NAUSEA MEDICATION  *UNUSUAL SHORTNESS OF BREATH  *UNUSUAL BRUISING OR BLEEDING  TENDERNESS IN MOUTH AND THROAT WITH OR WITHOUT PRESENCE OF ULCERS  *URINARY PROBLEMS  *BOWEL PROBLEMS  UNUSUAL RASH Items with * indicate a potential emergency and should be followed up as soon as possible.  Feel free to call the clinic you have any questions or concerns. The clinic phone number is [phone].  Please show the [facility] at check-in to the Emergency Department and triage nurse.

## 2016-07-29 NOTE — Progress Notes (Signed)
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  PRIOR THERAPY: The patient was previously treated with a tapering dose of prednisone and high-dose IVIG for the hemolytic anemia before his diagnosis of paroxysmal nocturnal hemoglobinuria.  CURRENT THERAPY: Soliris (Eculizumab) initially with induction 600 MG IV weekly for 4 weeks and currently on maintenance treatment 900 MG IV every 2 weeks. First dose of treatment was given on 11/21/2014. Status post 21 cycles.  INTERVAL HISTORY: Travis Mcdaniel 46 y.o. male came to the clinic today for follow-up visit. The patient is doing fine today with no specific complaints. He is tolerating his treatment with Soliris fairly well with no significant adverse effects. His serum bilirubin has been elevated recently but the patient also has been drinking lots over the weekend. He has no nausea, vomiting, diarrhea or constipation. The patient denied having any chest pain, shortness of breath, cough or hemoptysis. He has no fever or chills. He is here today for evaluation before starting the next dose of his treatment.  MEDICAL HISTORY: Past Medical History:  Diagnosis Date  . Diverticulosis   . Encounter for antineoplastic chemotherapy 01/15/2016  . Hypertension     ALLERGIES:  has No Known Allergies.  MEDICATIONS:  Current Outpatient Prescriptions  Medication Sig Dispense Refill  . ALPRAZolam (XANAX) 1 MG tablet Take 1 mg by mouth 3 (three) times daily.  2  . aspirin EC 81 MG tablet Take 81 mg by mouth.    . hydrochlorothiazide (HYDRODIURIL) 25 MG tablet     . lisinopril (PRINIVIL,ZESTRIL) 20 MG tablet Take 20 mg by mouth daily.  0  . Multiple Vitamin (MULTIVITAMIN WITH MINERALS) TABS tablet Take 1 tablet by mouth daily.    . Psyllium (METAMUCIL  PO) Take 1 capsule by mouth daily.    . Vitamins/Minerals TABS Take by mouth.     No current facility-administered medications for this visit.     SURGICAL HISTORY: History reviewed. No pertinent surgical history.  REVIEW OF SYSTEMS:  A comprehensive review of systems was negative.   PHYSICAL EXAMINATION: General appearance: alert, cooperative and no distress Head: Normocephalic, without obvious abnormality, atraumatic Neck: no adenopathy, no JVD, supple, symmetrical, trachea midline and thyroid not enlarged, symmetric, no tenderness/mass/nodules Lymph nodes: Cervical, supraclavicular, and axillary nodes normal. Resp: clear to auscultation bilaterally Back: symmetric, no curvature. ROM normal. No CVA tenderness. Cardio: regular rate and rhythm, S1, S2 normal, no murmur, click, rub or gallop GI: soft, non-tender; bowel sounds normal; no masses,  no organomegaly Extremities: extremities normal, atraumatic, no cyanosis or edema  ECOG PERFORMANCE STATUS: 0 - Asymptomatic  Blood pressure 113/71, pulse 80, temperature 98.2 F (36.8 C), temperature source Oral, resp. rate 18, height 5\' 6"  (1.676 m), weight 174 lb 4.8 oz (79.1 kg), SpO2 100 %.  LABORATORY DATA: Lab Results  Component Value Date   WBC 6.4 07/29/2016   HGB 12.7 (L) 07/29/2016   HCT 37.0 (L) 07/29/2016   MCV 112.7 (H) 07/29/2016   PLT 157 07/29/2016      Chemistry      Component Value Date/Time   NA 136 07/29/2016 1117   K 4.2 07/29/2016 1117   CL 110 09/07/2014 0407   CO2 23 07/29/2016 1117   BUN 11.8 07/29/2016 1117   CREATININE 0.8 07/29/2016 1117  Component Value Date/Time   CALCIUM 9.4 07/29/2016 1117   ALKPHOS 83 07/29/2016 1117   AST 103 (H) 07/29/2016 1117   ALT 139 (H) 07/29/2016 1117   BILITOT 3.40 (H) 07/29/2016 1117       RADIOGRAPHIC STUDIES: No results found.  ASSESSMENT AND PLAN:  This is a very pleasant 46 years old white male with paroxysmal nocturnal hemoglobinuria (PNH),  currently on treatment with Soliris on days 1 and 15 every 4 weeks status post 22 cycles. He has been tolerating the treatment well with no significant adverse effects. I recommended for him to proceed with cycle #23 today as scheduled. He would come back for follow-up visit in 4 weeks for evaluation before starting cycle #24. For the hyperbilirubinemia, I strongly advised the patient to quit alcohol drinking. This could be also secondary to his condition with the paroxysmal nocturnal hemoglobinuria. He was advised to call immediately if he has any concerning symptoms in the interval. The patient voices understanding of current disease status and treatment options and is in agreement with the current care plan.  All questions were answered. The patient knows to call the clinic with any problems, questions or concerns. We can certainly see the patient much sooner if necessary. I spent 10 minutes counseling the patient face to face. The total time spent in the appointment was 15 minutes.  Disclaimer: This note was dictated with voice recognition software. Similar sounding words can inadvertently be transcribed and may not be corrected upon review.

## 2016-08-12 ENCOUNTER — Other Ambulatory Visit (HOSPITAL_BASED_OUTPATIENT_CLINIC_OR_DEPARTMENT_OTHER): Payer: BLUE CROSS/BLUE SHIELD

## 2016-08-12 ENCOUNTER — Ambulatory Visit (HOSPITAL_BASED_OUTPATIENT_CLINIC_OR_DEPARTMENT_OTHER): Payer: BLUE CROSS/BLUE SHIELD

## 2016-08-12 VITALS — BP 128/80 | HR 72 | Temp 97.7°F | Resp 18

## 2016-08-12 DIAGNOSIS — D595 Paroxysmal nocturnal hemoglobinuria [Marchiafava-Micheli]: Secondary | ICD-10-CM

## 2016-08-12 DIAGNOSIS — Z5112 Encounter for antineoplastic immunotherapy: Secondary | ICD-10-CM | POA: Diagnosis not present

## 2016-08-12 LAB — CBC WITH DIFFERENTIAL/PLATELET
BASO%: 0.7 % (ref 0.0–2.0)
BASOS ABS: 0 10*3/uL (ref 0.0–0.1)
EOS ABS: 0.2 10*3/uL (ref 0.0–0.5)
EOS%: 3.9 % (ref 0.0–7.0)
HEMATOCRIT: 41.7 % (ref 38.4–49.9)
HEMOGLOBIN: 14 g/dL (ref 13.0–17.1)
LYMPH%: 26.1 % (ref 14.0–49.0)
MCH: 37.9 pg — AB (ref 27.2–33.4)
MCHC: 33.6 g/dL (ref 32.0–36.0)
MCV: 113 fL — ABNORMAL HIGH (ref 79.3–98.0)
MONO#: 0.4 10*3/uL (ref 0.1–0.9)
MONO%: 9.2 % (ref 0.0–14.0)
NEUT#: 2.8 10*3/uL (ref 1.5–6.5)
NEUT%: 60.1 % (ref 39.0–75.0)
PLATELETS: 126 10*3/uL — AB (ref 140–400)
RBC: 3.69 10*6/uL — ABNORMAL LOW (ref 4.20–5.82)
RDW: 14.4 % (ref 11.0–14.6)
WBC: 4.6 10*3/uL (ref 4.0–10.3)
lymph#: 1.2 10*3/uL (ref 0.9–3.3)

## 2016-08-12 LAB — COMPREHENSIVE METABOLIC PANEL
ALBUMIN: 4.5 g/dL (ref 3.5–5.0)
ALK PHOS: 76 U/L (ref 40–150)
ALT: 119 U/L — ABNORMAL HIGH (ref 0–55)
ANION GAP: 12 meq/L — AB (ref 3–11)
AST: 67 U/L — AB (ref 5–34)
BUN: 13.6 mg/dL (ref 7.0–26.0)
CALCIUM: 9.6 mg/dL (ref 8.4–10.4)
CHLORIDE: 103 meq/L (ref 98–109)
CO2: 22 mEq/L (ref 22–29)
CREATININE: 0.9 mg/dL (ref 0.7–1.3)
EGFR: >90 mL/min/{1.73_m2} (ref >90)
Glucose: 98 mg/dl (ref 70–140)
POTASSIUM: 3.8 meq/L (ref 3.5–5.1)
Sodium: 138 mEq/L (ref 136–145)
Total Bilirubin: 2.85 mg/dL — ABNORMAL HIGH (ref 0.20–1.20)
Total Protein: 7.5 g/dL (ref 6.4–8.3)

## 2016-08-12 LAB — LACTATE DEHYDROGENASE: LDH: 226 U/L (ref 125–245)

## 2016-08-12 MED ORDER — SODIUM CHLORIDE 0.9 % IV SOLN
900.0000 mg | Freq: Once | INTRAVENOUS | Status: AC
  Administered 2016-08-12: 900 mg via INTRAVENOUS
  Filled 2016-08-12: qty 90

## 2016-08-12 MED ORDER — SODIUM CHLORIDE 0.9 % IV SOLN
Freq: Once | INTRAVENOUS | Status: AC
  Administered 2016-08-12: 09:00:00 via INTRAVENOUS

## 2016-08-12 NOTE — Patient Instructions (Signed)
[facility] Discharge Instructions for Patients Receiving Chemotherapy  Today you received the following chemotherapy agents:  Soliris.  To help prevent nausea and vomiting after your treatment, we encourage you to take your nausea medication as directed.   If you develop nausea and vomiting that is not controlled by your nausea medication, call the clinic.   BELOW ARE SYMPTOMS THAT SHOULD BE REPORTED IMMEDIATELY:  *FEVER GREATER THAN 100.5 F  *CHILLS WITH OR WITHOUT FEVER  NAUSEA AND VOMITING THAT IS NOT CONTROLLED WITH YOUR NAUSEA MEDICATION  *UNUSUAL SHORTNESS OF BREATH  *UNUSUAL BRUISING OR BLEEDING  TENDERNESS IN MOUTH AND THROAT WITH OR WITHOUT PRESENCE OF ULCERS  *URINARY PROBLEMS  *BOWEL PROBLEMS  UNUSUAL RASH Items with * indicate a potential emergency and should be followed up as soon as possible.  Feel free to call the clinic you have any questions or concerns. The clinic phone number is [phone].  Please show the CHEMO ALERT CARD at check-in to the Emergency Department and triage nurse.   

## 2016-08-12 NOTE — Progress Notes (Signed)
Pt refused to stay for one hour post observation. Pt tolerated infusion well. Pt and VS stable at discharge.

## 2016-08-15 IMAGING — CR DG ANKLE COMPLETE 3+V*R*
4 series · 4 of 4 positions shown · non-contrast
Comparison: none

CLINICAL DATA: Fractured ankle.  Initial evaluation.

EXAM:
RIGHT ANKLE - COMPLETE 3+ VIEW

[AP]
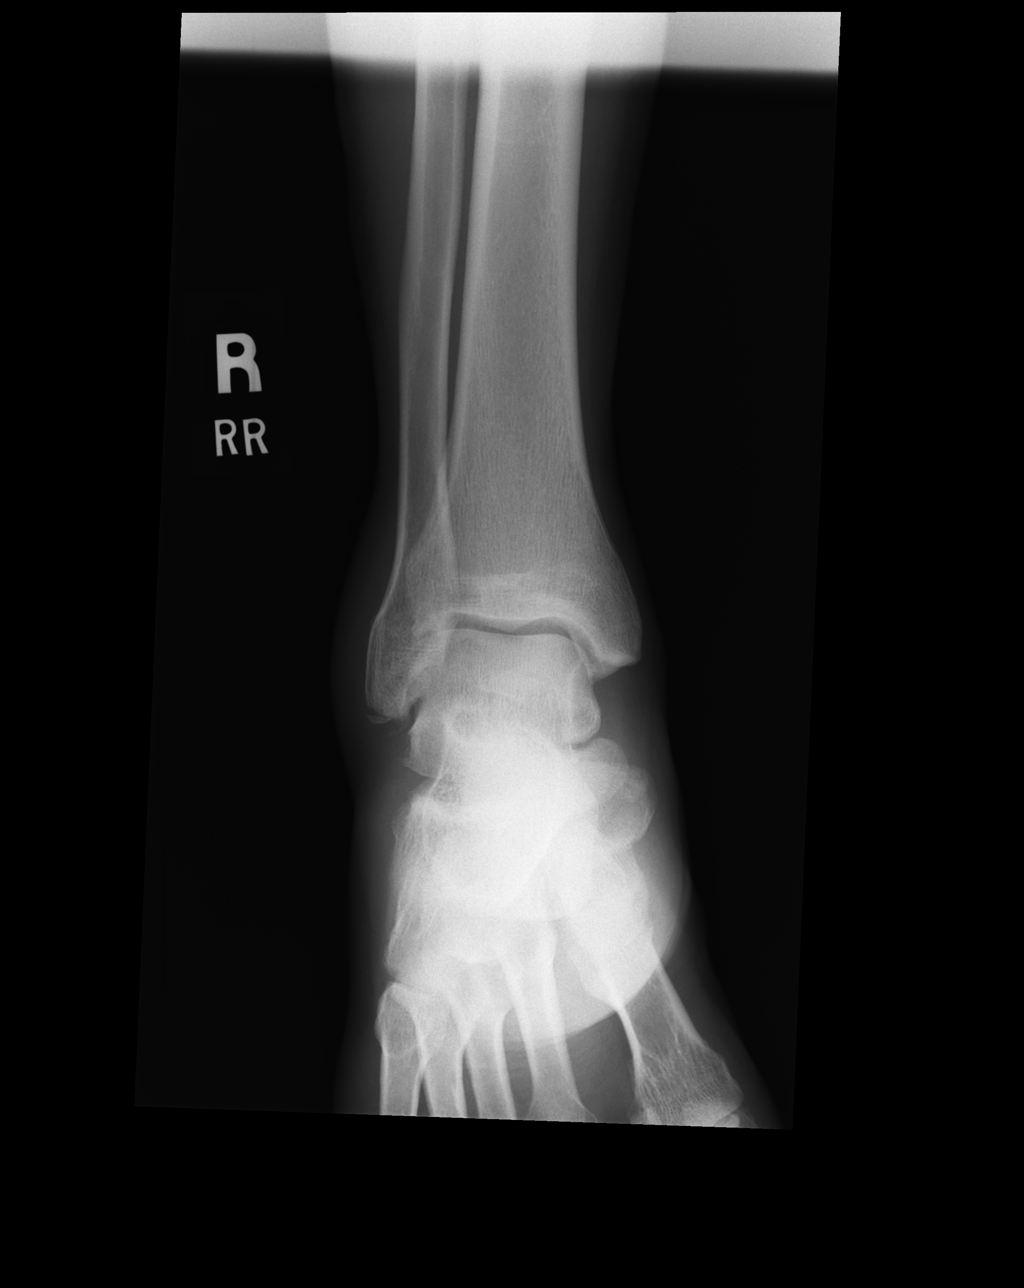

[ap obl int rot (1 of 2)]
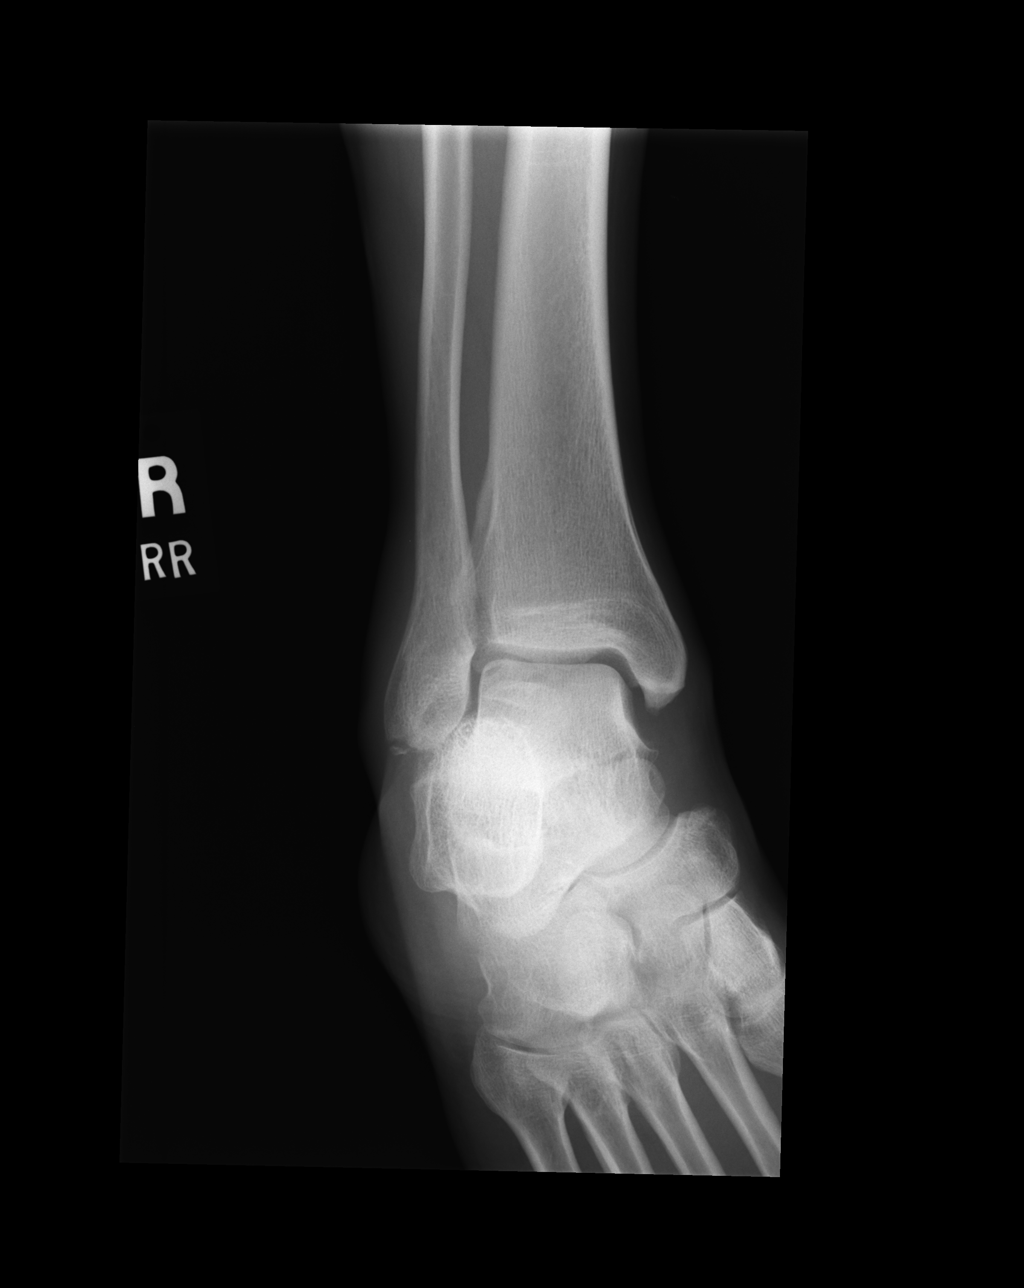

[ap obl int rot (2 of 2)]
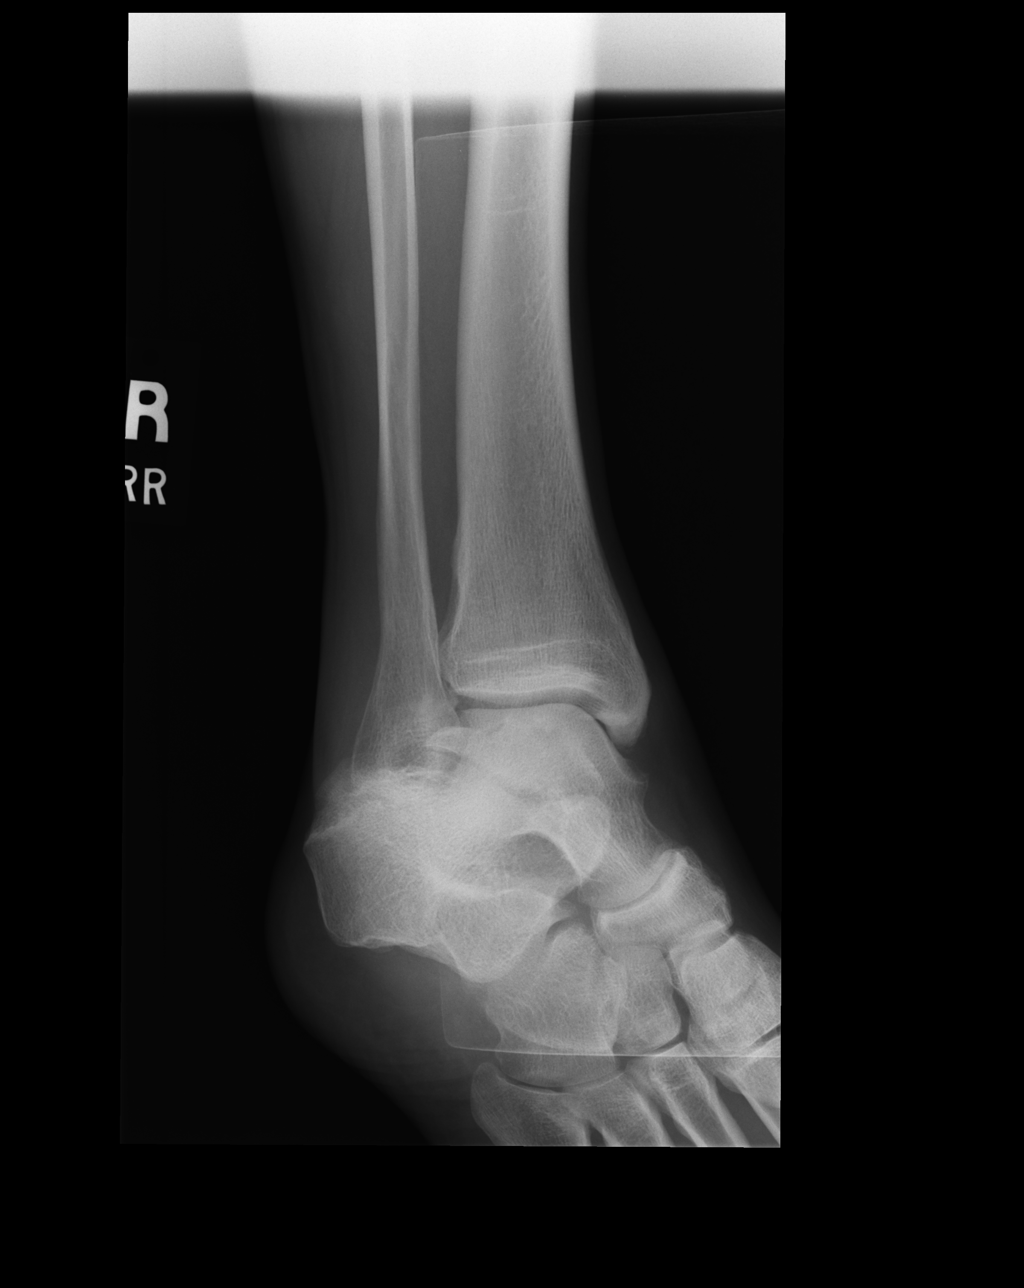

[lateral]
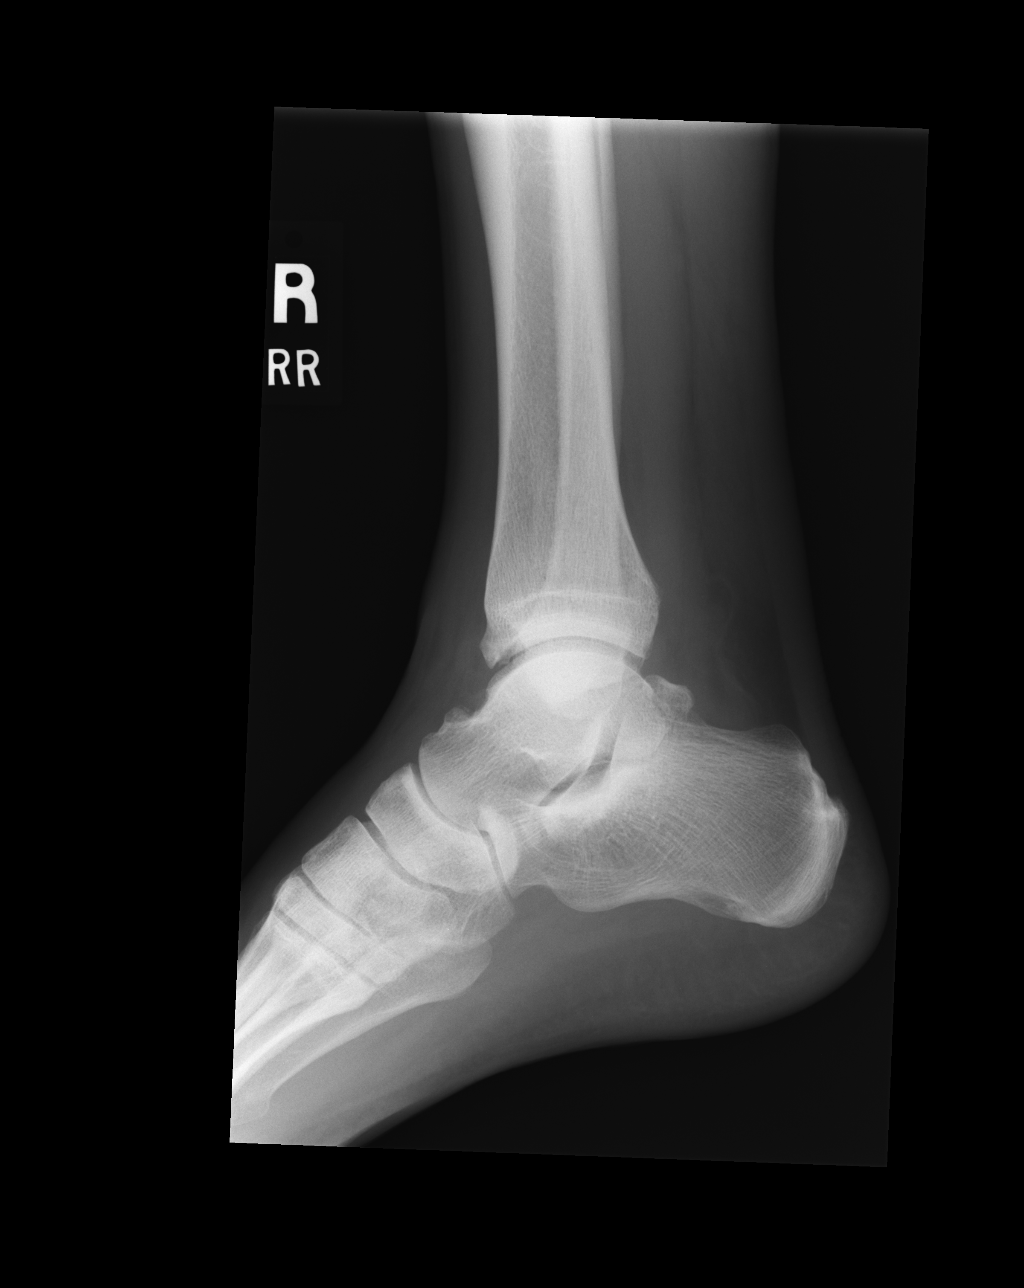

[4 of 4 positions shown; findings below may reference images not displayed]

FINDINGS: Avulsion fractures noted of the distal portion of the lateral
malleolus. Subtle avulsion fracture of the distal tip the medial
malleolus cannot be excluded. No other focal abnormality.
IMPRESSION: Subtle avulsion fractures of the distal tip of the lateral
malleolus. Subtle avulsion fracture of the medial malleolar tip
cannot be excluded.

## 2016-08-26 ENCOUNTER — Ambulatory Visit (HOSPITAL_BASED_OUTPATIENT_CLINIC_OR_DEPARTMENT_OTHER): Payer: BLUE CROSS/BLUE SHIELD | Admitting: Internal Medicine

## 2016-08-26 ENCOUNTER — Encounter: Payer: Self-pay | Admitting: Internal Medicine

## 2016-08-26 ENCOUNTER — Other Ambulatory Visit (HOSPITAL_BASED_OUTPATIENT_CLINIC_OR_DEPARTMENT_OTHER): Payer: BLUE CROSS/BLUE SHIELD

## 2016-08-26 ENCOUNTER — Ambulatory Visit (HOSPITAL_BASED_OUTPATIENT_CLINIC_OR_DEPARTMENT_OTHER): Payer: BLUE CROSS/BLUE SHIELD

## 2016-08-26 VITALS — BP 117/76 | HR 70 | Temp 98.4°F | Resp 20 | Ht 66.0 in | Wt 173.2 lb

## 2016-08-26 DIAGNOSIS — Z5111 Encounter for antineoplastic chemotherapy: Secondary | ICD-10-CM

## 2016-08-26 DIAGNOSIS — D595 Paroxysmal nocturnal hemoglobinuria [Marchiafava-Micheli]: Secondary | ICD-10-CM | POA: Diagnosis not present

## 2016-08-26 DIAGNOSIS — Z5112 Encounter for antineoplastic immunotherapy: Secondary | ICD-10-CM

## 2016-08-26 LAB — COMPREHENSIVE METABOLIC PANEL
ALT: 84 U/L — ABNORMAL HIGH (ref 0–55)
AST: 53 U/L — AB (ref 5–34)
Albumin: 4.5 g/dL (ref 3.5–5.0)
Alkaline Phosphatase: 70 U/L (ref 40–150)
Anion Gap: 11 mEq/L (ref 3–11)
BUN: 14.1 mg/dL (ref 7.0–26.0)
CO2: 22 meq/L (ref 22–29)
Calcium: 9.5 mg/dL (ref 8.4–10.4)
Chloride: 105 mEq/L (ref 98–109)
Creatinine: 0.8 mg/dL (ref 0.7–1.3)
EGFR: >90 mL/min/{1.73_m2} (ref >90)
GLUCOSE: 92 mg/dL (ref 70–140)
POTASSIUM: 4.1 meq/L (ref 3.5–5.1)
SODIUM: 137 meq/L (ref 136–145)
TOTAL PROTEIN: 7.3 g/dL (ref 6.4–8.3)
Total Bilirubin: 2.55 mg/dL — ABNORMAL HIGH (ref 0.20–1.20)

## 2016-08-26 LAB — CBC WITH DIFFERENTIAL/PLATELET
BASO%: 0.2 % (ref 0.0–2.0)
Basophils Absolute: 0 10*3/uL (ref 0.0–0.1)
EOS ABS: 0.1 10*3/uL (ref 0.0–0.5)
EOS%: 3.4 % (ref 0.0–7.0)
HCT: 39.1 % (ref 38.4–49.9)
HGB: 13.3 g/dL (ref 13.0–17.1)
LYMPH%: 28 % (ref 14.0–49.0)
MCH: 37.4 pg — ABNORMAL HIGH (ref 27.2–33.4)
MCHC: 34 g/dL (ref 32.0–36.0)
MCV: 109.8 fL — AB (ref 79.3–98.0)
MONO#: 0.4 10*3/uL (ref 0.1–0.9)
MONO%: 10.7 % (ref 0.0–14.0)
NEUT%: 57.7 % (ref 39.0–75.0)
NEUTROS ABS: 2.4 10*3/uL (ref 1.5–6.5)
Platelets: 131 10*3/uL — ABNORMAL LOW (ref 140–400)
RBC: 3.56 10*6/uL — AB (ref 4.20–5.82)
RDW: 13.7 % (ref 11.0–14.6)
WBC: 4.1 10*3/uL (ref 4.0–10.3)
lymph#: 1.2 10*3/uL (ref 0.9–3.3)

## 2016-08-26 LAB — LACTATE DEHYDROGENASE: LDH: 230 U/L (ref 125–245)

## 2016-08-26 MED ORDER — ECULIZUMAB 300 MG/30ML IV SOLN
900.0000 mg | Freq: Once | INTRAVENOUS | Status: AC
  Administered 2016-08-26: 900 mg via INTRAVENOUS
  Filled 2016-08-26: qty 90

## 2016-08-26 MED ORDER — SODIUM CHLORIDE 0.9 % IV SOLN
Freq: Once | INTRAVENOUS | Status: AC
  Administered 2016-08-26: 11:00:00 via INTRAVENOUS

## 2016-08-26 NOTE — Patient Instructions (Signed)
[facility] Discharge Instructions for Patients Receiving Chemotherapy  Today you received the following chemotherapy agents:  Soliris.  To help prevent nausea and vomiting after your treatment, we encourage you to take your nausea medication as directed.   If you develop nausea and vomiting that is not controlled by your nausea medication, call the clinic.   BELOW ARE SYMPTOMS THAT SHOULD BE REPORTED IMMEDIATELY:  *FEVER GREATER THAN 100.5 F  *CHILLS WITH OR WITHOUT FEVER  NAUSEA AND VOMITING THAT IS NOT CONTROLLED WITH YOUR NAUSEA MEDICATION  *UNUSUAL SHORTNESS OF BREATH  *UNUSUAL BRUISING OR BLEEDING  TENDERNESS IN MOUTH AND THROAT WITH OR WITHOUT PRESENCE OF ULCERS  *URINARY PROBLEMS  *BOWEL PROBLEMS  UNUSUAL RASH Items with * indicate a potential emergency and should be followed up as soon as possible.  Feel free to call the clinic you have any questions or concerns. The clinic phone number is [phone].  Please show the CHEMO ALERT CARD at check-in to the Emergency Department and triage nurse.   

## 2016-08-26 NOTE — Progress Notes (Signed)
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  PRIOR THERAPY: The patient was previously treated with a tapering dose of prednisone and high-dose IVIG for the hemolytic anemia before his diagnosis of paroxysmal nocturnal hemoglobinuria.  CURRENT THERAPY: Soliris (Eculizumab) initially with induction 600 MG IV weekly for 4 weeks and currently on maintenance treatment 900 MG IV every 2 weeks. First dose of treatment was given on 11/21/2014. Status post 23 cycles.  INTERVAL HISTORY: Travis Mcdaniel 46 y.o. male came to the clinic today for follow-up visit. The patient is feeling fine today with no specific complaints. He is tolerating his current treatment with Soliris fairly well. He denied having any nausea, vomiting, diarrhea or constipation. He has no chest pain, shortness breath, cough or hemoptysis. He has no fever or chills. He is here today for evaluation before starting cycle #24 of his treatment.  MEDICAL HISTORY: Past Medical History:  Diagnosis Date  . Diverticulosis   . Encounter for antineoplastic chemotherapy 01/15/2016  . Hypertension     ALLERGIES:  has No Known Allergies.  MEDICATIONS:  Current Outpatient Prescriptions  Medication Sig Dispense Refill  . ALPRAZolam (XANAX) 1 MG tablet Take 1 mg by mouth 3 (three) times daily.  2  . aspirin EC 81 MG tablet Take 81 mg by mouth.    . hydrochlorothiazide (HYDRODIURIL) 25 MG tablet     . lisinopril-hydrochlorothiazide (PRINZIDE,ZESTORETIC) 20-25 MG tablet Take 1 tablet by mouth daily.  1  . Multiple Vitamin (MULTIVITAMIN WITH MINERALS) TABS tablet Take 1 tablet by mouth daily.    . Psyllium (METAMUCIL PO) Take 1 capsule by mouth daily.    . Vitamins/Minerals TABS Take by mouth.     No current facility-administered  medications for this visit.     SURGICAL HISTORY: History reviewed. No pertinent surgical history.  REVIEW OF SYSTEMS:  A comprehensive review of systems was negative.   PHYSICAL EXAMINATION: General appearance: alert, cooperative and no distress Head: Normocephalic, without obvious abnormality, atraumatic Neck: no adenopathy, no JVD, supple, symmetrical, trachea midline and thyroid not enlarged, symmetric, no tenderness/mass/nodules Lymph nodes: Cervical, supraclavicular, and axillary nodes normal. Resp: clear to auscultation bilaterally Back: symmetric, no curvature. ROM normal. No CVA tenderness. Cardio: regular rate and rhythm, S1, S2 normal, no murmur, click, rub or gallop GI: soft, non-tender; bowel sounds normal; no masses,  no organomegaly Extremities: extremities normal, atraumatic, no cyanosis or edema  ECOG PERFORMANCE STATUS: 0 - Asymptomatic  Blood pressure 117/76, pulse 70, temperature 98.4 F (36.9 C), temperature source Oral, resp. rate 20, height 5\' 6"  (1.676 m), weight 173 lb 3.2 oz (78.6 kg), SpO2 100 %.  LABORATORY DATA: Lab Results  Component Value Date   WBC 4.1 08/26/2016   HGB 13.3 08/26/2016   HCT 39.1 08/26/2016   MCV 109.8 (H) 08/26/2016   PLT 131 (L) 08/26/2016      Chemistry      Component Value Date/Time   NA 138 08/12/2016 0752   K 3.8 08/12/2016 0752   CL 110 09/07/2014 0407   CO2 22 08/12/2016 0752   BUN 13.6 08/12/2016 0752   CREATININE 0.9 08/12/2016 0752      Component Value Date/Time   CALCIUM 9.6 08/12/2016 0752   ALKPHOS 76 08/12/2016 0752   AST 67 (H) 08/12/2016 0752   ALT 119 (  H) 08/12/2016 0752   BILITOT 2.85 (H) 08/12/2016 [id number]       RADIOGRAPHIC STUDIES: No results found.  ASSESSMENT AND PLAN:  This is a very pleasant 46 years old white male with paroxysmal nocturnal hemoglobinuria (PNH). The patient is currently undergoing treatment with Soliris on days 1 and 15 every 4 weeks status post 23 cycles. He has been  tolerating his treatment well with no significant adverse effects. I recommended for the patient to proceed with cycle #24 today as a scheduled. I will see him back for follow-up visit in 4 weeks for evaluation before starting cycle #25. He was advised to call immediately if he has any concerning symptoms in the interval. The patient voices understanding of current disease status and treatment options and is in agreement with the current care plan.  All questions were answered. The patient knows to call the clinic with any problems, questions or concerns. We can certainly see the patient much sooner if necessary. I spent 10 minutes counseling the patient face to face. The total time spent in the appointment was 15 minutes.  Disclaimer: This note was dictated with voice recognition software. Similar sounding words can inadvertently be transcribed and may not be corrected upon review.

## 2016-08-26 NOTE — Progress Notes (Signed)
Patient refused to stay for 1 hour post infusion observation. Patient stable upon discharge.

## 2016-08-27 ENCOUNTER — Telehealth: Payer: Self-pay | Admitting: Internal Medicine

## 2016-08-27 NOTE — Telephone Encounter (Signed)
Appointments scheduled per 2/28 LOS. Patient came to scheduling to schedule appointments. Given AVS report and calendars with future scheduled appointments.

## 2016-09-02 ENCOUNTER — Telehealth: Payer: Self-pay | Admitting: *Deleted

## 2016-09-02 NOTE — Telephone Encounter (Signed)
Pt called stating he developed scratchy throat yesterday.  Denied fever, denied coughing.  Noted thick yellow mucus when blowing nose.  Pt requested to see [facility] for antibiotics today. Pt's      Phone     [phone].

## 2016-09-02 NOTE — Telephone Encounter (Signed)
Returned call to pt, who advised he spoke with Dr. Ardelle Anton office and is getting a zpack for symptoms. No further concerns.

## 2016-09-09 ENCOUNTER — Encounter: Payer: Self-pay | Admitting: Nurse Practitioner

## 2016-09-09 ENCOUNTER — Ambulatory Visit (HOSPITAL_BASED_OUTPATIENT_CLINIC_OR_DEPARTMENT_OTHER): Payer: BLUE CROSS/BLUE SHIELD

## 2016-09-09 ENCOUNTER — Other Ambulatory Visit: Payer: Self-pay | Admitting: Nurse Practitioner

## 2016-09-09 ENCOUNTER — Other Ambulatory Visit (HOSPITAL_BASED_OUTPATIENT_CLINIC_OR_DEPARTMENT_OTHER): Payer: BLUE CROSS/BLUE SHIELD

## 2016-09-09 ENCOUNTER — Ambulatory Visit (HOSPITAL_BASED_OUTPATIENT_CLINIC_OR_DEPARTMENT_OTHER): Payer: BLUE CROSS/BLUE SHIELD | Admitting: Nurse Practitioner

## 2016-09-09 VITALS — BP 112/72 | HR 71 | Temp 97.9°F | Resp 18

## 2016-09-09 DIAGNOSIS — J0100 Acute maxillary sinusitis, unspecified: Secondary | ICD-10-CM

## 2016-09-09 DIAGNOSIS — D595 Paroxysmal nocturnal hemoglobinuria [Marchiafava-Micheli]: Secondary | ICD-10-CM

## 2016-09-09 DIAGNOSIS — J019 Acute sinusitis, unspecified: Secondary | ICD-10-CM | POA: Diagnosis not present

## 2016-09-09 DIAGNOSIS — J329 Chronic sinusitis, unspecified: Secondary | ICD-10-CM | POA: Insufficient documentation

## 2016-09-09 DIAGNOSIS — Z5112 Encounter for antineoplastic immunotherapy: Secondary | ICD-10-CM | POA: Diagnosis not present

## 2016-09-09 LAB — CBC WITH DIFFERENTIAL/PLATELET
BASO%: 0.5 % (ref 0.0–2.0)
BASOS ABS: 0 10*3/uL (ref 0.0–0.1)
EOS%: 2.9 % (ref 0.0–7.0)
Eosinophils Absolute: 0.2 10*3/uL (ref 0.0–0.5)
HCT: 34 % — ABNORMAL LOW (ref 38.4–49.9)
HGB: 10.9 g/dL — ABNORMAL LOW (ref 13.0–17.1)
LYMPH%: 24.2 % (ref 14.0–49.0)
MCH: 36.8 pg — ABNORMAL HIGH (ref 27.2–33.4)
MCHC: 32.1 g/dL (ref 32.0–36.0)
MCV: 114.9 fL — ABNORMAL HIGH (ref 79.3–98.0)
MONO#: 0.4 10*3/uL (ref 0.1–0.9)
MONO%: 6.8 % (ref 0.0–14.0)
NEUT#: 3.7 10*3/uL (ref 1.5–6.5)
NEUT%: 65.6 % (ref 39.0–75.0)
Platelets: 171 10*3/uL (ref 140–400)
RBC: 2.96 10*6/uL — ABNORMAL LOW (ref 4.20–5.82)
RDW: 18.6 % — ABNORMAL HIGH (ref 11.0–14.6)
WBC: 5.6 10*3/uL (ref 4.0–10.3)
lymph#: 1.4 10*3/uL (ref 0.9–3.3)

## 2016-09-09 LAB — COMPREHENSIVE METABOLIC PANEL
ALT: 42 U/L (ref 0–55)
AST: 39 U/L — AB (ref 5–34)
Albumin: 4.3 g/dL (ref 3.5–5.0)
Alkaline Phosphatase: 81 U/L (ref 40–150)
Anion Gap: 12 mEq/L — ABNORMAL HIGH (ref 3–11)
BUN: 12 mg/dL (ref 7.0–26.0)
CHLORIDE: 103 meq/L (ref 98–109)
CO2: 24 mEq/L (ref 22–29)
Calcium: 9.3 mg/dL (ref 8.4–10.4)
Creatinine: 1 mg/dL (ref 0.7–1.3)
EGFR: >90 mL/min/{1.73_m2} (ref >90)
GLUCOSE: 129 mg/dL (ref 70–140)
POTASSIUM: 4 meq/L (ref 3.5–5.1)
SODIUM: 138 meq/L (ref 136–145)
Total Bilirubin: 3.91 mg/dL (ref 0.20–1.20)
Total Protein: 7.3 g/dL (ref 6.4–8.3)

## 2016-09-09 LAB — LACTATE DEHYDROGENASE: LDH: 282 U/L — AB (ref 125–245)

## 2016-09-09 MED ORDER — AMOXICILLIN-POT CLAVULANATE 875-125 MG PO TABS: 1.0000 | ORAL_TABLET | Freq: Two times a day (BID) | ORAL | 0 refills | Status: DC

## 2016-09-09 MED ORDER — SODIUM CHLORIDE 0.9 % IV SOLN
Freq: Once | INTRAVENOUS | Status: AC
  Administered 2016-09-09: 08:00:00 via INTRAVENOUS

## 2016-09-09 MED ORDER — ECULIZUMAB 300 MG/30ML IV SOLN
900.0000 mg | Freq: Once | INTRAVENOUS | Status: AC
  Administered 2016-09-09: 900 mg via INTRAVENOUS
  Filled 2016-09-09: qty 90

## 2016-09-09 NOTE — Progress Notes (Signed)
Cbc/cmet reviewed by MD verbal order ok to treat with Bili 3.91

## 2016-09-09 NOTE — Assessment & Plan Note (Signed)
Patient presented to the [facility] today to receive his next Soliris antibody infusion.  Patient states he's been doing fairly well; with the exception of a sinusitis infection.  He denies any recent fevers or chills.  Of note-patient's bilirubin has increased to 3.91.  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  We'll continue to monitor closely.

## 2016-09-09 NOTE — Progress Notes (Signed)
Dr. Julien Nordmann okay to tx pt today with Bilirubin of 3.91.

## 2016-09-09 NOTE — Assessment & Plan Note (Signed)
Patient presented to the [facility] today for his next antibody infusion.  He states that he developed a severe sinus infection last week; and was given a Z-Pak per his primary care provider.  However, he continues with nasal congestion, scratchy throat and a deep congested cough.  He feels that he has continuous sinus drainage down the back of his throat.  He denies any shortness of breath.  He denies any recent fevers or chills.  Exam today reveals lungs clear bilaterally.  Patient does appear very congested with minimal to no facial tenderness with palpation.  Patient appears nontoxic and is afebrile.  Advice patient would prescribe patient Augmentin for treatment of any remaining sinusitis symptoms.  He was also encouraged to take over-the-counter Mucinex.  He was advised to follow back up with his primary care provider if he continues with any worrisome symptoms.  Also, he was advised to go directly to the emergency department for any significantly worsening symptoms.

## 2016-09-09 NOTE — Progress Notes (Signed)
SYMPTOM MANAGEMENT CLINIC    Chief Complaint: Sinusitis  HPI:  Travis Mcdaniel 46 y.o. 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  Currently undergoing Soliris antibody infusions.    No history exists.    Review of Systems  HENT: Positive for congestion and sinus pain.   Respiratory: Positive for cough.   All other systems reviewed and are negative.   Past Medical History:  Diagnosis Date  . Diverticulosis   . Encounter for antineoplastic chemotherapy 01/15/2016  . Hypertension     History reviewed. No pertinent surgical history.  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    has No Known Allergies.  Allergies as of 09/09/2016   No Known Allergies     Medication List       Accurate as of 09/09/16 11:22 AM. Always use your most recent med list.          ALPRAZolam 1 MG tablet Commonly known as:  XANAX Take 1 mg by mouth 3 (three) times daily.   amoxicillin-clavulanate 875-125 MG tablet Commonly known as:  AUGMENTIN Take 1 tablet by mouth 2 (two) times daily.   aspirin EC 81 MG tablet Take 81 mg by mouth.   hydrochlorothiazide 25 MG tablet Commonly known as:  HYDRODIURIL   lisinopril-hydrochlorothiazide 20-25 MG tablet Commonly known as:  PRINZIDE,ZESTORETIC Take 1 tablet by mouth daily.   METAMUCIL PO Take 1 capsule by mouth daily.   multivitamin with minerals Tabs tablet Take 1 tablet by mouth daily.   Vitamins/Minerals Tabs Take by mouth.        PHYSICAL EXAMINATION  Oncology Vitals 09/09/2016 09/09/2016  Height - -  Weight - -  Weight (lbs) - -  BMI (kg/m2) - -  Temp 97.9 -  Pulse 71 71  Resp 18 18  SpO2 100 -  BSA (m2) - -   BP Readings from Last 2  Encounters:  09/09/16 112/72  09/09/16 112/72    Physical Exam  Constitutional: He is oriented to person, place, and time and well-developed, well-nourished, and in no distress.  HENT:  Head: Normocephalic and atraumatic.  Mouth/Throat: Oropharynx is clear and moist.  Significant nasal congestion and a hoarse voice.  The facial tenderness with exam  Eyes: Conjunctivae and EOM are normal. Pupils are equal, round, and reactive to light. Right eye exhibits no discharge. Left eye exhibits no discharge. No scleral icterus.  Neck: Normal range of motion. Neck supple. No JVD present. No tracheal deviation present. No thyromegaly present.  Cardiovascular: Normal rate, regular rhythm, normal heart sounds and intact distal pulses.   Pulmonary/Chest: Effort normal and breath sounds normal. No respiratory distress. He has no wheezes. He has no rales. He exhibits no tenderness.  Abdominal: Soft. Bowel sounds are normal. He exhibits no distension and no mass. There is no tenderness. There is no rebound and no guarding.  Musculoskeletal: Normal range of motion. He exhibits no edema, tenderness or deformity.  Lymphadenopathy:    He has no cervical adenopathy.  Neurological: He is alert and oriented to person, place, and time. Gait normal.  Skin: Skin is warm and dry. No rash noted. No erythema. No pallor.  Psychiatric: Affect normal.  Nursing note and vitals reviewed.   LABORATORY DATA:. Appointment on 09/09/2016  Component Date Value Ref Range Status  . WBC 09/09/2016  5.6  4.0 - 10.3 10e3/uL Final  . NEUT# 09/09/2016 3.7  1.5 - 6.5 10e3/uL Final  . HGB 09/09/2016 10.9* 13.0 - 17.1 g/dL Final  . HCT 09/09/2016 34.0* 38.4 - 49.9 % Final  . Platelets 09/09/2016 171  140 - 400 10e3/uL Final  . MCV 09/09/2016 114.9* 79.3 - 98.0 fL Final  . MCH 09/09/2016 36.8* 27.2 - 33.4 pg Final  . MCHC 09/09/2016 32.1  32.0 - 36.0 g/dL Final  . RBC 09/09/2016 2.96* 4.20 - 5.82 10e6/uL Final  . RDW 09/09/2016  18.6* 11.0 - 14.6 % Final  . lymph# 09/09/2016 1.4  0.9 - 3.3 10e3/uL Final  . MONO# 09/09/2016 0.4  0.1 - 0.9 10e3/uL Final  . Eosinophils Absolute 09/09/2016 0.2  0.0 - 0.5 10e3/uL Final  . Basophils Absolute 09/09/2016 0.0  0.0 - 0.1 10e3/uL Final  . NEUT% 09/09/2016 65.6  39.0 - 75.0 % Final  . LYMPH% 09/09/2016 24.2  14.0 - 49.0 % Final  . MONO% 09/09/2016 6.8  0.0 - 14.0 % Final  . EOS% 09/09/2016 2.9  0.0 - 7.0 % Final  . BASO% 09/09/2016 0.5  0.0 - 2.0 % Final  . Sodium 09/09/2016 138  136 - 145 mEq/L Final  . Potassium 09/09/2016 4.0  3.5 - 5.1 mEq/L Final  . Chloride 09/09/2016 103  98 - 109 mEq/L Final  . CO2 09/09/2016 24  22 - 29 mEq/L Final  . Glucose 09/09/2016 129  70 - 140 mg/dl Final  . BUN 09/09/2016 12.0  7.0 - 26.0 mg/dL Final  . Creatinine 09/09/2016 1.0  0.7 - 1.3 mg/dL Final  . Total Bilirubin 09/09/2016 3.91* 0.20 - 1.20 mg/dL Final  . Alkaline Phosphatase 09/09/2016 81  40 - 150 U/L Final  . AST 09/09/2016 39* 5 - 34 U/L Final  . ALT 09/09/2016 42  0 - 55 U/L Final  . Total Protein 09/09/2016 7.3  6.4 - 8.3 g/dL Final  . Albumin 09/09/2016 4.3  3.5 - 5.0 g/dL Final  . Calcium 09/09/2016 9.3  8.4 - 10.4 mg/dL Final  . Anion Gap 09/09/2016 12* 3 - 11 mEq/L Final  . EGFR 09/09/2016 >90  >90 ml/min/1.73 m2 Final  . LDH 09/09/2016 282* 125 - 245 U/L Final    RADIOGRAPHIC STUDIES: No results found.  ASSESSMENT/PLAN:    Sinusitis Patient presented to the [facility] today for his next antibody infusion.  He states that he developed a severe sinus infection last week; and was given a Z-Pak per his primary care provider.  However, he continues with nasal congestion, scratchy throat and a deep congested cough.  He feels that he has continuous sinus drainage down the back of his throat.  He denies any shortness of breath.  He denies any recent fevers or chills.  Exam today reveals lungs clear bilaterally.  Patient does appear very congested with minimal to no  facial tenderness with palpation.  Patient appears nontoxic and is afebrile.  Advice patient would prescribe patient Augmentin for treatment of any remaining sinusitis symptoms.  He was also encouraged to take over-the-counter Mucinex.  He was advised to follow back up with his primary care provider if he continues with any worrisome symptoms.  Also, he was advised to go directly to the emergency department for any significantly worsening symptoms.  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  Patient states he's been doing fairly well; with the exception  of a sinusitis infection.  He denies any recent fevers or chills.  Of note-patient's bilirubin has increased to 3.91.  Patient will return on 09/23/2016 for labs, visit, his next antibody infusion.  Hyperbilirubinemia Patient presented to the [facility] today to receive his next Soliris antibody infusion.  Patient states he's been doing fairly well; with the exception of a sinusitis infection.  He denies any recent fevers or chills.  Of note-patient's bilirubin has increased to 3.91.  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  We'll continue to monitor closely.    Patient stated understanding of all instructions; and was in agreement with this plan of care. The patient knows to call the clinic with any problems, questions or concerns.   Total time spent with patient was 15 minutes;  with greater than 75 percent of that time spent in face to face counseling regarding patient's symptoms,  and coordination of care and follow up.  Disclaimer:This dictation was prepared with Dragon/digital dictation along with [employer]. Any transcriptional errors that result from this process are unintentional.  Drue Second, NP 09/09/2016

## 2016-09-09 NOTE — Assessment & Plan Note (Signed)
Patient presented to the [facility] today to receive his next Soliris antibody infusion.  Patient states he's been doing fairly well; with the exception of a sinusitis infection.  He denies any recent fevers or chills.  Of note-patient's bilirubin has increased to 3.91.  Patient will return on 09/23/2016 for labs, visit, his next antibody infusion.

## 2016-09-09 NOTE — Patient Instructions (Signed)
[facility] Discharge Instructions for Patients Receiving Chemotherapy  Today you received the following chemotherapy agents: Soliris   To help prevent nausea and vomiting after your treatment, we encourage you to take your nausea medication as directed.    If you develop nausea and vomiting that is not controlled by your nausea medication, call the clinic.   BELOW ARE SYMPTOMS THAT SHOULD BE REPORTED IMMEDIATELY:  *FEVER GREATER THAN 100.5 F  *CHILLS WITH OR WITHOUT FEVER  NAUSEA AND VOMITING THAT IS NOT CONTROLLED WITH YOUR NAUSEA MEDICATION  *UNUSUAL SHORTNESS OF BREATH  *UNUSUAL BRUISING OR BLEEDING  TENDERNESS IN MOUTH AND THROAT WITH OR WITHOUT PRESENCE OF ULCERS  *URINARY PROBLEMS  *BOWEL PROBLEMS  UNUSUAL RASH Items with * indicate a potential emergency and should be followed up as soon as possible.  Feel free to call the clinic you have any questions or concerns. The clinic phone number is [phone].  Please show the [facility] at check-in to the Emergency Department and triage nurse.

## 2016-09-09 NOTE — Progress Notes (Signed)
Pt refused to stay for post infusion observation.

## 2016-09-23 ENCOUNTER — Ambulatory Visit (HOSPITAL_BASED_OUTPATIENT_CLINIC_OR_DEPARTMENT_OTHER): Payer: BLUE CROSS/BLUE SHIELD | Admitting: Internal Medicine

## 2016-09-23 ENCOUNTER — Encounter: Payer: Self-pay | Admitting: Internal Medicine

## 2016-09-23 ENCOUNTER — Ambulatory Visit: Payer: BLUE CROSS/BLUE SHIELD

## 2016-09-23 ENCOUNTER — Ambulatory Visit (HOSPITAL_BASED_OUTPATIENT_CLINIC_OR_DEPARTMENT_OTHER): Payer: BLUE CROSS/BLUE SHIELD

## 2016-09-23 VITALS — BP 124/64 | HR 62 | Temp 97.9°F | Resp 18 | Ht 66.0 in | Wt 171.6 lb

## 2016-09-23 DIAGNOSIS — D595 Paroxysmal nocturnal hemoglobinuria [Marchiafava-Micheli]: Secondary | ICD-10-CM

## 2016-09-23 DIAGNOSIS — Z5111 Encounter for antineoplastic chemotherapy: Secondary | ICD-10-CM

## 2016-09-23 DIAGNOSIS — Z5112 Encounter for antineoplastic immunotherapy: Secondary | ICD-10-CM | POA: Diagnosis not present

## 2016-09-23 LAB — COMPREHENSIVE METABOLIC PANEL
ALBUMIN: 4.3 g/dL (ref 3.5–5.0)
ALT: 58 U/L — AB (ref 0–55)
AST: 47 U/L — AB (ref 5–34)
Alkaline Phosphatase: 65 U/L (ref 40–150)
Anion Gap: 11 mEq/L (ref 3–11)
BUN: 14.5 mg/dL (ref 7.0–26.0)
CHLORIDE: 103 meq/L (ref 98–109)
CO2: 24 mEq/L (ref 22–29)
CREATININE: 0.8 mg/dL (ref 0.7–1.3)
Calcium: 9.2 mg/dL (ref 8.4–10.4)
EGFR: >90 mL/min/{1.73_m2} (ref >90)
GLUCOSE: 81 mg/dL (ref 70–140)
POTASSIUM: 4.1 meq/L (ref 3.5–5.1)
SODIUM: 138 meq/L (ref 136–145)
Total Bilirubin: 2.63 mg/dL — ABNORMAL HIGH (ref 0.20–1.20)
Total Protein: 7 g/dL (ref 6.4–8.3)

## 2016-09-23 LAB — CBC WITH DIFFERENTIAL/PLATELET
BASO%: 1.1 % (ref 0.0–2.0)
Basophils Absolute: 0 10*3/uL (ref 0.0–0.1)
EOS ABS: 0.1 10*3/uL (ref 0.0–0.5)
EOS%: 3.5 % (ref 0.0–7.0)
HCT: 40.1 % (ref 38.4–49.9)
HGB: 13.4 g/dL (ref 13.0–17.1)
LYMPH%: 28.1 % (ref 14.0–49.0)
MCH: 38.5 pg — ABNORMAL HIGH (ref 27.2–33.4)
MCHC: 33.4 g/dL (ref 32.0–36.0)
MCV: 115 fL — AB (ref 79.3–98.0)
MONO#: 0.3 10*3/uL (ref 0.1–0.9)
MONO%: 10 % (ref 0.0–14.0)
NEUT%: 57.3 % (ref 39.0–75.0)
NEUTROS ABS: 1.8 10*3/uL (ref 1.5–6.5)
Platelets: 127 10*3/uL — ABNORMAL LOW (ref 140–400)
RBC: 3.49 10*6/uL — AB (ref 4.20–5.82)
RDW: 15.6 % — ABNORMAL HIGH (ref 11.0–14.6)
WBC: 3.2 10*3/uL — AB (ref 4.0–10.3)
lymph#: 0.9 10*3/uL (ref 0.9–3.3)

## 2016-09-23 LAB — LACTATE DEHYDROGENASE: LDH: 216 U/L (ref 125–245)

## 2016-09-23 MED ORDER — SODIUM CHLORIDE 0.9 % IV SOLN
900.0000 mg | Freq: Once | INTRAVENOUS | Status: AC
  Administered 2016-09-23: 900 mg via INTRAVENOUS
  Filled 2016-09-23: qty 90

## 2016-09-23 MED ORDER — SODIUM CHLORIDE 0.9 % IV SOLN
Freq: Once | INTRAVENOUS | Status: AC
  Administered 2016-09-23: 10:00:00 via INTRAVENOUS

## 2016-09-23 NOTE — Progress Notes (Signed)
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  PRIOR THERAPY: The patient was previously treated with a tapering dose of prednisone and high-dose IVIG for the hemolytic anemia before his diagnosis of paroxysmal nocturnal hemoglobinuria.  CURRENT THERAPY: Soliris (Eculizumab) initially with induction 600 MG IV weekly for 4 weeks and currently on maintenance treatment 900 MG IV every 2 weeks. First dose of treatment was given on 11/21/2014. Status post 24 cycles.  INTERVAL HISTORY: Travis Mcdaniel 46 y.o. male returns to the clinic today for follow-up visit. He is feeling much better today. He was treated 2 weeks ago for sinus infection and feeling much better. He denied having any chest pain, shortness of breath, cough or hemoptysis. He has no significant weight loss or night sweats. He denied having any nausea or vomiting. His rate today for evaluation before starting cycle #25 of his treatment.  MEDICAL HISTORY: Past Medical History:  Diagnosis Date  . Diverticulosis   . Encounter for antineoplastic chemotherapy 01/15/2016  . Hypertension     ALLERGIES:  has No Known Allergies.  MEDICATIONS:  Current Outpatient Prescriptions  Medication Sig Dispense Refill  . ALPRAZolam (XANAX) 1 MG tablet Take 1 mg by mouth 3 (three) times daily.  2  . aspirin EC 81 MG tablet Take 81 mg by mouth.    [name] Kitchen lisinopril-hydrochlorothiazide (PRINZIDE,ZESTORETIC) 20-25 MG tablet Take 1 tablet by mouth daily.  1  . Multiple Vitamin (MULTIVITAMIN WITH MINERALS) TABS tablet Take 1 tablet by mouth daily.    . Psyllium (METAMUCIL PO) Take 1 capsule by mouth daily.    . Vitamins/Minerals TABS Take by mouth.     No current facility-administered medications for this visit.     SURGICAL HISTORY: No past  surgical history on file.  REVIEW OF SYSTEMS:  A comprehensive review of systems was negative.   PHYSICAL EXAMINATION: General appearance: alert, cooperative and no distress Head: Normocephalic, without obvious abnormality, atraumatic Neck: no adenopathy, no JVD, supple, symmetrical, trachea midline and thyroid not enlarged, symmetric, no tenderness/mass/nodules Lymph nodes: Cervical, supraclavicular, and axillary nodes normal. Resp: clear to auscultation bilaterally Back: symmetric, no curvature. ROM normal. No CVA tenderness. Cardio: regular rate and rhythm, S1, S2 normal, no murmur, click, rub or gallop GI: soft, non-tender; bowel sounds normal; no masses,  no organomegaly Extremities: extremities normal, atraumatic, no cyanosis or edema  ECOG PERFORMANCE STATUS: 0 - Asymptomatic  Blood pressure 124/64, pulse 62, temperature 97.9 F (36.6 C), temperature source Oral, resp. rate 18, height 5\' 6"  (1.676 m), weight 171 lb 9.6 oz (77.8 kg), SpO2 100 %.  LABORATORY DATA: Lab Results  Component Value Date   WBC 3.2 (L) 09/23/2016   HGB 13.4 09/23/2016   HCT 40.1 09/23/2016   MCV 115.0 (H) 09/23/2016   PLT 127 (L) 09/23/2016      Chemistry      Component Value Date/Time   NA 138 09/09/2016 0745   K 4.0 09/09/2016 0745   CL 110 09/07/2014 0407   CO2 24 09/09/2016 0745   BUN 12.0 09/09/2016 0745   CREATININE 1.0 09/09/2016 0745      Component Value Date/Time   CALCIUM 9.3 09/09/2016 0745   ALKPHOS 81 09/09/2016 0745   AST 39 (H) 09/09/2016 0745   ALT 42 09/09/2016 0745   BILITOT 3.91 (HH) 09/09/2016  0745       RADIOGRAPHIC STUDIES: No results found.  ASSESSMENT AND PLAN:  This is a very pleasant 46 years old white male with paroxysmal nocturnal hemoglobinuria currently on treatment with Soliris on days 1 and 15 every 4 weeks is status post 24 cycles. The patient continues to tolerate his treatment well. I recommended for him to proceed with cycle #25 today as  scheduled. I will continue to monitor his CBC and comprehensive metabolic panel closely during this treatment. He would come back for follow-up visit in 4 weeks for evaluation before starting cycle #26. The patient was advised to call immediately if he has any concerning symptoms in the interval. The patient voices understanding of current disease status and treatment options and is in agreement with the current care plan.  All questions were answered. The patient knows to call the clinic with any problems, questions or concerns. We can certainly see the patient much sooner if necessary. I spent 10 minutes counseling the patient face to face. The total time spent in the appointment was 15 minutes.  Disclaimer: This note was dictated with voice recognition software. Similar sounding words can inadvertently be transcribed and may not be corrected upon review.

## 2016-09-23 NOTE — Patient Instructions (Signed)
[facility] Discharge Instructions for Patients Receiving Chemotherapy  Today you received the following chemotherapy agents:  Soliris.  To help prevent nausea and vomiting after your treatment, we encourage you to take your nausea medication as directed.   If you develop nausea and vomiting that is not controlled by your nausea medication, call the clinic.   BELOW ARE SYMPTOMS THAT SHOULD BE REPORTED IMMEDIATELY:  *FEVER GREATER THAN 100.5 F  *CHILLS WITH OR WITHOUT FEVER  NAUSEA AND VOMITING THAT IS NOT CONTROLLED WITH YOUR NAUSEA MEDICATION  *UNUSUAL SHORTNESS OF BREATH  *UNUSUAL BRUISING OR BLEEDING  TENDERNESS IN MOUTH AND THROAT WITH OR WITHOUT PRESENCE OF ULCERS  *URINARY PROBLEMS  *BOWEL PROBLEMS  UNUSUAL RASH Items with * indicate a potential emergency and should be followed up as soon as possible.  Feel free to call the clinic you have any questions or concerns. The clinic phone number is [phone].  Please show the [facility] at check-in to the Emergency Department and triage nurse.

## 2016-09-23 NOTE — Progress Notes (Signed)
Per Dr Julien Nordmann it is okay to treat pt today with Solaris  and today's lab results.

## 2016-09-25 ENCOUNTER — Telehealth: Payer: Self-pay | Admitting: Internal Medicine

## 2016-09-25 NOTE — Telephone Encounter (Signed)
Appts already scheduled. Per 09/23/2016 los. - no additional appts needed

## 2016-10-07 ENCOUNTER — Ambulatory Visit (HOSPITAL_BASED_OUTPATIENT_CLINIC_OR_DEPARTMENT_OTHER): Payer: BLUE CROSS/BLUE SHIELD

## 2016-10-07 ENCOUNTER — Other Ambulatory Visit (HOSPITAL_BASED_OUTPATIENT_CLINIC_OR_DEPARTMENT_OTHER): Payer: BLUE CROSS/BLUE SHIELD

## 2016-10-07 VITALS — BP 115/63 | HR 67 | Temp 98.2°F | Resp 18

## 2016-10-07 DIAGNOSIS — D595 Paroxysmal nocturnal hemoglobinuria [Marchiafava-Micheli]: Secondary | ICD-10-CM

## 2016-10-07 DIAGNOSIS — Z5112 Encounter for antineoplastic immunotherapy: Secondary | ICD-10-CM | POA: Diagnosis not present

## 2016-10-07 LAB — CBC WITH DIFFERENTIAL/PLATELET
BASO%: 0.9 % (ref 0.0–2.0)
Basophils Absolute: 0 10*3/uL (ref 0.0–0.1)
EOS ABS: 0.1 10*3/uL (ref 0.0–0.5)
EOS%: 2.3 % (ref 0.0–7.0)
HCT: 38.8 % (ref 38.4–49.9)
HGB: 13.2 g/dL (ref 13.0–17.1)
LYMPH%: 23 % (ref 14.0–49.0)
MCH: 37.6 pg — AB (ref 27.2–33.4)
MCHC: 34 g/dL (ref 32.0–36.0)
MCV: 110.6 fL — ABNORMAL HIGH (ref 79.3–98.0)
MONO#: 0.4 10*3/uL (ref 0.1–0.9)
MONO%: 8.5 % (ref 0.0–14.0)
NEUT%: 65.3 % (ref 39.0–75.0)
NEUTROS ABS: 3.2 10*3/uL (ref 1.5–6.5)
Platelets: 150 10*3/uL (ref 140–400)
RBC: 3.51 10*6/uL — AB (ref 4.20–5.82)
RDW: 13.6 % (ref 11.0–14.6)
WBC: 4.8 10*3/uL (ref 4.0–10.3)
lymph#: 1.1 10*3/uL (ref 0.9–3.3)

## 2016-10-07 LAB — COMPREHENSIVE METABOLIC PANEL
ALT: 61 U/L — ABNORMAL HIGH (ref 0–55)
ANION GAP: 11 meq/L (ref 3–11)
AST: 43 U/L — ABNORMAL HIGH (ref 5–34)
Albumin: 4.5 g/dL (ref 3.5–5.0)
Alkaline Phosphatase: 62 U/L (ref 40–150)
BILIRUBIN TOTAL: 2.71 mg/dL — AB (ref 0.20–1.20)
BUN: 13.4 mg/dL (ref 7.0–26.0)
CHLORIDE: 105 meq/L (ref 98–109)
CO2: 22 meq/L (ref 22–29)
Calcium: 9.3 mg/dL (ref 8.4–10.4)
Creatinine: 0.8 mg/dL (ref 0.7–1.3)
GLUCOSE: 92 mg/dL (ref 70–140)
POTASSIUM: 4.1 meq/L (ref 3.5–5.1)
SODIUM: 137 meq/L (ref 136–145)
Total Protein: 7.1 g/dL (ref 6.4–8.3)

## 2016-10-07 LAB — LACTATE DEHYDROGENASE: LDH: 229 U/L (ref 125–245)

## 2016-10-07 MED ORDER — ECULIZUMAB 300 MG/30ML IV SOLN
900.0000 mg | Freq: Once | INTRAVENOUS | Status: AC
  Administered 2016-10-07: 900 mg via INTRAVENOUS
  Filled 2016-10-07: qty 90

## 2016-10-07 MED ORDER — SODIUM CHLORIDE 0.9 % IV SOLN
Freq: Once | INTRAVENOUS | Status: AC
  Administered 2016-10-07: 13:00:00 via INTRAVENOUS

## 2016-10-07 NOTE — Patient Instructions (Signed)
[facility] Discharge Instructions for Patients Receiving Chemotherapy  Today you received the following chemotherapy agents Solaris.   To help prevent nausea and vomiting after your treatment, we encourage you to take your nausea medication as directed.   If you develop nausea and vomiting that is not controlled by your nausea medication, call the clinic.   BELOW ARE SYMPTOMS THAT SHOULD BE REPORTED IMMEDIATELY:  *FEVER GREATER THAN 100.5 F  *CHILLS WITH OR WITHOUT FEVER  NAUSEA AND VOMITING THAT IS NOT CONTROLLED WITH YOUR NAUSEA MEDICATION  *UNUSUAL SHORTNESS OF BREATH  *UNUSUAL BRUISING OR BLEEDING  TENDERNESS IN MOUTH AND THROAT WITH OR WITHOUT PRESENCE OF ULCERS  *URINARY PROBLEMS  *BOWEL PROBLEMS  UNUSUAL RASH Items with * indicate a potential emergency and should be followed up as soon as possible.  Feel free to call the clinic you have any questions or concerns. The clinic phone number is [phone].  Please show the [facility] at check-in to the Emergency Department and triage nurse.

## 2016-10-07 NOTE — Progress Notes (Signed)
Patient refused 30 minute post observation. Patient discharged in stable condition.

## 2016-10-21 ENCOUNTER — Telehealth: Payer: Self-pay | Admitting: Internal Medicine

## 2016-10-21 ENCOUNTER — Ambulatory Visit (HOSPITAL_BASED_OUTPATIENT_CLINIC_OR_DEPARTMENT_OTHER): Payer: BLUE CROSS/BLUE SHIELD | Admitting: Internal Medicine

## 2016-10-21 ENCOUNTER — Encounter: Payer: Self-pay | Admitting: Internal Medicine

## 2016-10-21 ENCOUNTER — Other Ambulatory Visit (HOSPITAL_BASED_OUTPATIENT_CLINIC_OR_DEPARTMENT_OTHER): Payer: BLUE CROSS/BLUE SHIELD

## 2016-10-21 ENCOUNTER — Ambulatory Visit (HOSPITAL_BASED_OUTPATIENT_CLINIC_OR_DEPARTMENT_OTHER): Payer: BLUE CROSS/BLUE SHIELD

## 2016-10-21 VITALS — BP 117/59 | HR 72 | Temp 98.5°F | Resp 19 | Ht 66.0 in | Wt 171.1 lb

## 2016-10-21 DIAGNOSIS — D595 Paroxysmal nocturnal hemoglobinuria [Marchiafava-Micheli]: Secondary | ICD-10-CM

## 2016-10-21 DIAGNOSIS — Z5111 Encounter for antineoplastic chemotherapy: Secondary | ICD-10-CM

## 2016-10-21 DIAGNOSIS — Z5112 Encounter for antineoplastic immunotherapy: Secondary | ICD-10-CM | POA: Diagnosis not present

## 2016-10-21 LAB — CBC WITH DIFFERENTIAL/PLATELET
BASO%: 0.4 % (ref 0.0–2.0)
BASOS ABS: 0 10*3/uL (ref 0.0–0.1)
EOS%: 2.7 % (ref 0.0–7.0)
Eosinophils Absolute: 0.2 10*3/uL (ref 0.0–0.5)
HEMATOCRIT: 40.2 % (ref 38.4–49.9)
HEMOGLOBIN: 13.5 g/dL (ref 13.0–17.1)
LYMPH%: 18 % (ref 14.0–49.0)
MCH: 37.4 pg — AB (ref 27.2–33.4)
MCHC: 33.5 g/dL (ref 32.0–36.0)
MCV: 111.6 fL — ABNORMAL HIGH (ref 79.3–98.0)
MONO#: 0.5 10*3/uL (ref 0.1–0.9)
MONO%: 6.7 % (ref 0.0–14.0)
NEUT#: 5 10*3/uL (ref 1.5–6.5)
NEUT%: 72.2 % (ref 39.0–75.0)
PLATELETS: 140 10*3/uL (ref 140–400)
RBC: 3.6 10*6/uL — ABNORMAL LOW (ref 4.20–5.82)
RDW: 14.2 % (ref 11.0–14.6)
WBC: 6.9 10*3/uL (ref 4.0–10.3)
lymph#: 1.2 10*3/uL (ref 0.9–3.3)

## 2016-10-21 LAB — COMPREHENSIVE METABOLIC PANEL
ALT: 42 U/L (ref 0–55)
AST: 29 U/L (ref 5–34)
Albumin: 4.4 g/dL (ref 3.5–5.0)
Alkaline Phosphatase: 66 U/L (ref 40–150)
Anion Gap: 13 mEq/L — ABNORMAL HIGH (ref 3–11)
BUN: 11.4 mg/dL (ref 7.0–26.0)
CHLORIDE: 101 meq/L (ref 98–109)
CO2: 23 meq/L (ref 22–29)
Calcium: 9.2 mg/dL (ref 8.4–10.4)
Creatinine: 0.8 mg/dL (ref 0.7–1.3)
EGFR: >90 mL/min/{1.73_m2} (ref >90)
GLUCOSE: 96 mg/dL (ref 70–140)
POTASSIUM: 4.1 meq/L (ref 3.5–5.1)
SODIUM: 136 meq/L (ref 136–145)
Total Bilirubin: 3.05 mg/dL — ABNORMAL HIGH (ref 0.20–1.20)
Total Protein: 7.1 g/dL (ref 6.4–8.3)

## 2016-10-21 LAB — LACTATE DEHYDROGENASE: LDH: 231 U/L (ref 125–245)

## 2016-10-21 MED ORDER — SODIUM CHLORIDE 0.9 % IV SOLN
Freq: Once | INTRAVENOUS | Status: AC
  Administered 2016-10-21: 14:00:00 via INTRAVENOUS

## 2016-10-21 MED ORDER — SODIUM CHLORIDE 0.9 % IV SOLN
900.0000 mg | Freq: Once | INTRAVENOUS | Status: AC
  Administered 2016-10-21: 900 mg via INTRAVENOUS
  Filled 2016-10-21: qty 90

## 2016-10-21 NOTE — Progress Notes (Signed)
Patient declined to stay for 1-hr post-infusion observation

## 2016-10-21 NOTE — Patient Instructions (Signed)
[facility] Discharge Instructions for Patients Receiving Chemotherapy  Today you received the following chemotherapy agents Soliris   To help prevent nausea and vomiting after your treatment, we encourage you to take your nausea medication as directed.    If you develop nausea and vomiting that is not controlled by your nausea medication, call the clinic.   BELOW ARE SYMPTOMS THAT SHOULD BE REPORTED IMMEDIATELY:  *FEVER GREATER THAN 100.5 F  *CHILLS WITH OR WITHOUT FEVER  NAUSEA AND VOMITING THAT IS NOT CONTROLLED WITH YOUR NAUSEA MEDICATION  *UNUSUAL SHORTNESS OF BREATH  *UNUSUAL BRUISING OR BLEEDING  TENDERNESS IN MOUTH AND THROAT WITH OR WITHOUT PRESENCE OF ULCERS  *URINARY PROBLEMS  *BOWEL PROBLEMS  UNUSUAL RASH Items with * indicate a potential emergency and should be followed up as soon as possible.  Feel free to call the clinic you have any questions or concerns. The clinic phone number is [phone].  Please show the [facility] at check-in to the Emergency Department and triage nurse.

## 2016-10-21 NOTE — Telephone Encounter (Signed)
Patient did not want AVS or calender . Appts scheduled per 4/25 los.

## 2016-10-21 NOTE — Progress Notes (Signed)
Ok to treat pt today with Solaris and today's bilirubin.

## 2016-10-21 NOTE — Progress Notes (Signed)
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  PRIOR THERAPY: The patient was previously treated with a tapering dose of prednisone and high-dose IVIG for the hemolytic anemia before his diagnosis of paroxysmal nocturnal hemoglobinuria.  CURRENT THERAPY: Soliris (Eculizumab) initially with induction 600 MG IV weekly for 4 weeks and currently on maintenance treatment 900 MG IV every 2 weeks. First dose of treatment was given on 11/21/2014. Status post 25 cycles.  INTERVAL HISTORY: Travis Mcdaniel 46 y.o. male returns to the clinic today for follow-up visit. The patient continues to do fine with no specific complaints. He denied having any chest pain, shortness of breath, cough or hemoptysis. He denied having any fever or chills. He has no nausea, vomiting, diarrhea or constipation. He denied having any significant weight loss or night sweats. He has no fatigue or weakness. He is tolerating his current treatment with Soliris fairly well. He is here today for evaluation before starting cycle #26.  MEDICAL HISTORY: Past Medical History:  Diagnosis Date  . Diverticulosis   . Encounter for antineoplastic chemotherapy 01/15/2016  . Hypertension     ALLERGIES:  has No Known Allergies.  MEDICATIONS:  Current Outpatient Prescriptions  Medication Sig Dispense Refill  . ALPRAZolam (XANAX) 1 MG tablet Take 1 mg by mouth 3 (three) times daily.  2  . aspirin EC 81 MG tablet Take 81 mg by mouth.    [name] Kitchen lisinopril-hydrochlorothiazide (PRINZIDE,ZESTORETIC) 20-25 MG tablet Take 1 tablet by mouth daily.  1  . Multiple Vitamin (MULTIVITAMIN WITH MINERALS) TABS tablet Take 1 tablet by mouth daily.    . Psyllium (METAMUCIL PO) Take 1 capsule by mouth daily.    . Vitamins/Minerals TABS Take by mouth.      No current facility-administered medications for this visit.     SURGICAL HISTORY: No past surgical history on file.  REVIEW OF SYSTEMS:  A comprehensive review of systems was negative.   PHYSICAL EXAMINATION: General appearance: alert, cooperative and no distress Head: Normocephalic, without obvious abnormality, atraumatic Neck: no adenopathy, no JVD, supple, symmetrical, trachea midline and thyroid not enlarged, symmetric, no tenderness/mass/nodules Lymph nodes: Cervical, supraclavicular, and axillary nodes normal. Resp: clear to auscultation bilaterally Back: symmetric, no curvature. ROM normal. No CVA tenderness. Cardio: regular rate and rhythm, S1, S2 normal, no murmur, click, rub or gallop GI: soft, non-tender; bowel sounds normal; no masses,  no organomegaly Extremities: extremities normal, atraumatic, no cyanosis or edema  ECOG PERFORMANCE STATUS: 0 - Asymptomatic  Blood pressure (!) 117/59, pulse 72, temperature 98.5 F (36.9 C), temperature source Oral, resp. rate 19, height 5\' 6"  (1.676 m), weight 171 lb 1.6 oz (77.6 kg), SpO2 100 %.  LABORATORY DATA: Lab Results  Component Value Date   WBC 6.9 10/21/2016   HGB 13.5 10/21/2016   HCT 40.2 10/21/2016   MCV 111.6 (H) 10/21/2016   PLT 140 10/21/2016      Chemistry      Component Value Date/Time   NA 136 10/21/2016 1220   K 4.1 10/21/2016 1220   CL 110 09/07/2014 0407   CO2 23 10/21/2016 1220   BUN 11.4 10/21/2016 1220   CREATININE 0.8 10/21/2016 1220      Component Value Date/Time   CALCIUM 9.2 10/21/2016 1220   ALKPHOS 66 10/21/2016 1220   AST 29 10/21/2016 1220  ALT 42 10/21/2016 1220   BILITOT 3.05 (H) 10/21/2016 1220       RADIOGRAPHIC STUDIES: No results found.  ASSESSMENT AND PLAN:  This is a very pleasant 46 years old white male with paroxysmal nocturnal hemoglobinuria currently on treatment with several areas every 2 weeks. The patient is status post 25 cycles and has been tolerating the  treatment well. His serum bilirubin is elevated today but he was drinking too much over the weekend. I recommended for the patient to proceed with his treatment today as a scheduled. I will see him back for follow-up visit in 4 weeks for reevaluation with repeat blood work before starting cycle #27. He was advised to call immediately if he has any concerning symptoms in the interval. The patient voices understanding of current disease status and treatment options and is in agreement with the current care plan. All questions were answered. The patient knows to call the clinic with any problems, questions or concerns. We can certainly see the patient much sooner if necessary. I spent 10 minutes counseling the patient face to face. The total time spent in the appointment was 15 minutes.   Disclaimer: This note was dictated with voice recognition software. Similar sounding words can inadvertently be transcribed and may not be corrected upon review.

## 2016-11-04 ENCOUNTER — Other Ambulatory Visit (HOSPITAL_BASED_OUTPATIENT_CLINIC_OR_DEPARTMENT_OTHER): Payer: BLUE CROSS/BLUE SHIELD

## 2016-11-04 ENCOUNTER — Ambulatory Visit (HOSPITAL_BASED_OUTPATIENT_CLINIC_OR_DEPARTMENT_OTHER): Payer: BLUE CROSS/BLUE SHIELD

## 2016-11-04 VITALS — BP 116/68 | HR 61 | Temp 97.6°F | Resp 18

## 2016-11-04 DIAGNOSIS — Z5112 Encounter for antineoplastic immunotherapy: Secondary | ICD-10-CM | POA: Diagnosis not present

## 2016-11-04 DIAGNOSIS — D595 Paroxysmal nocturnal hemoglobinuria [Marchiafava-Micheli]: Secondary | ICD-10-CM | POA: Diagnosis not present

## 2016-11-04 LAB — CBC WITH DIFFERENTIAL/PLATELET
BASO%: 0.4 % (ref 0.0–2.0)
Basophils Absolute: 0 10*3/uL (ref 0.0–0.1)
EOS%: 4.4 % (ref 0.0–7.0)
Eosinophils Absolute: 0.2 10*3/uL (ref 0.0–0.5)
HEMATOCRIT: 35.9 % — AB (ref 38.4–49.9)
HGB: 11.8 g/dL — ABNORMAL LOW (ref 13.0–17.1)
LYMPH#: 1.3 10*3/uL (ref 0.9–3.3)
LYMPH%: 26.8 % (ref 14.0–49.0)
MCH: 37.6 pg — ABNORMAL HIGH (ref 27.2–33.4)
MCHC: 32.9 g/dL (ref 32.0–36.0)
MCV: 114.3 fL — ABNORMAL HIGH (ref 79.3–98.0)
MONO#: 0.3 10*3/uL (ref 0.1–0.9)
MONO%: 7.1 % (ref 0.0–14.0)
NEUT%: 61.3 % (ref 39.0–75.0)
NEUTROS ABS: 3 10*3/uL (ref 1.5–6.5)
Platelets: 147 10*3/uL (ref 140–400)
RBC: 3.14 10*6/uL — AB (ref 4.20–5.82)
RDW: 16.5 % — ABNORMAL HIGH (ref 11.0–14.6)
WBC: 4.8 10*3/uL (ref 4.0–10.3)

## 2016-11-04 LAB — COMPREHENSIVE METABOLIC PANEL
ALT: 48 U/L (ref 0–55)
ANION GAP: 8 meq/L (ref 3–11)
AST: 35 U/L — AB (ref 5–34)
Albumin: 4.3 g/dL (ref 3.5–5.0)
Alkaline Phosphatase: 62 U/L (ref 40–150)
BUN: 15.9 mg/dL (ref 7.0–26.0)
CALCIUM: 9.2 mg/dL (ref 8.4–10.4)
CO2: 25 mEq/L (ref 22–29)
Chloride: 104 mEq/L (ref 98–109)
Creatinine: 0.9 mg/dL (ref 0.7–1.3)
Glucose: 107 mg/dl (ref 70–140)
POTASSIUM: 3.8 meq/L (ref 3.5–5.1)
Sodium: 138 mEq/L (ref 136–145)
TOTAL PROTEIN: 7 g/dL (ref 6.4–8.3)
Total Bilirubin: 2.3 mg/dL — ABNORMAL HIGH (ref 0.20–1.20)

## 2016-11-04 LAB — LACTATE DEHYDROGENASE: LDH: 221 U/L (ref 125–245)

## 2016-11-04 MED ORDER — SODIUM CHLORIDE 0.9 % IV SOLN
900.0000 mg | Freq: Once | INTRAVENOUS | Status: AC
  Administered 2016-11-04: 900 mg via INTRAVENOUS
  Filled 2016-11-04: qty 90

## 2016-11-04 MED ORDER — SODIUM CHLORIDE 0.9 % IV SOLN
Freq: Once | INTRAVENOUS | Status: AC
  Administered 2016-11-04: 14:00:00 via INTRAVENOUS

## 2016-11-04 NOTE — Patient Instructions (Signed)
[facility] Discharge Instructions for Patients Receiving Chemotherapy  Today you received the following chemotherapy agents: Soliris   To help prevent nausea and vomiting after your treatment, we encourage you to take your nausea medication as directed.    If you develop nausea and vomiting that is not controlled by your nausea medication, call the clinic.   BELOW ARE SYMPTOMS THAT SHOULD BE REPORTED IMMEDIATELY:  *FEVER GREATER THAN 100.5 F  *CHILLS WITH OR WITHOUT FEVER  NAUSEA AND VOMITING THAT IS NOT CONTROLLED WITH YOUR NAUSEA MEDICATION  *UNUSUAL SHORTNESS OF BREATH  *UNUSUAL BRUISING OR BLEEDING  TENDERNESS IN MOUTH AND THROAT WITH OR WITHOUT PRESENCE OF ULCERS  *URINARY PROBLEMS  *BOWEL PROBLEMS  UNUSUAL RASH Items with * indicate a potential emergency and should be followed up as soon as possible.  Feel free to call the clinic you have any questions or concerns. The clinic phone number is [phone].  Please show the [facility] at check-in to the Emergency Department and triage nurse.

## 2016-11-04 NOTE — Progress Notes (Signed)
Patient refused to stay for 1 hours post infusion observation. Patient and vital signs stable upon discharge.

## 2016-11-18 ENCOUNTER — Encounter: Payer: Self-pay | Admitting: Internal Medicine

## 2016-11-18 ENCOUNTER — Other Ambulatory Visit (HOSPITAL_BASED_OUTPATIENT_CLINIC_OR_DEPARTMENT_OTHER): Payer: BLUE CROSS/BLUE SHIELD

## 2016-11-18 ENCOUNTER — Ambulatory Visit (HOSPITAL_BASED_OUTPATIENT_CLINIC_OR_DEPARTMENT_OTHER): Payer: BLUE CROSS/BLUE SHIELD | Admitting: Internal Medicine

## 2016-11-18 ENCOUNTER — Ambulatory Visit (HOSPITAL_BASED_OUTPATIENT_CLINIC_OR_DEPARTMENT_OTHER): Payer: BLUE CROSS/BLUE SHIELD

## 2016-11-18 ENCOUNTER — Telehealth: Payer: Self-pay | Admitting: Internal Medicine

## 2016-11-18 VITALS — BP 116/67 | HR 60 | Temp 98.4°F | Resp 18 | Ht 66.0 in | Wt 170.1 lb

## 2016-11-18 DIAGNOSIS — Z5111 Encounter for antineoplastic chemotherapy: Secondary | ICD-10-CM

## 2016-11-18 DIAGNOSIS — Z5112 Encounter for antineoplastic immunotherapy: Secondary | ICD-10-CM | POA: Diagnosis not present

## 2016-11-18 DIAGNOSIS — D595 Paroxysmal nocturnal hemoglobinuria [Marchiafava-Micheli]: Secondary | ICD-10-CM

## 2016-11-18 LAB — COMPREHENSIVE METABOLIC PANEL
ALT: 44 U/L (ref 0–55)
AST: 37 U/L — AB (ref 5–34)
Albumin: 4.4 g/dL (ref 3.5–5.0)
Alkaline Phosphatase: 62 U/L (ref 40–150)
Anion Gap: 11 mEq/L (ref 3–11)
BUN: 18 mg/dL (ref 7.0–26.0)
CHLORIDE: 104 meq/L (ref 98–109)
CO2: 23 meq/L (ref 22–29)
Calcium: 9.2 mg/dL (ref 8.4–10.4)
Creatinine: 0.9 mg/dL (ref 0.7–1.3)
EGFR: >90 mL/min/{1.73_m2} (ref >90)
Glucose: 92 mg/dl (ref 70–140)
Potassium: 4 mEq/L (ref 3.5–5.1)
SODIUM: 138 meq/L (ref 136–145)
Total Bilirubin: 2.71 mg/dL — ABNORMAL HIGH (ref 0.20–1.20)
Total Protein: 7 g/dL (ref 6.4–8.3)

## 2016-11-18 LAB — CBC WITH DIFFERENTIAL/PLATELET
BASO%: 0.7 % (ref 0.0–2.0)
Basophils Absolute: 0 10*3/uL (ref 0.0–0.1)
EOS%: 4.4 % (ref 0.0–7.0)
Eosinophils Absolute: 0.2 10*3/uL (ref 0.0–0.5)
HEMATOCRIT: 36.7 % — AB (ref 38.4–49.9)
HEMOGLOBIN: 12.4 g/dL — AB (ref 13.0–17.1)
LYMPH#: 1.2 10*3/uL (ref 0.9–3.3)
LYMPH%: 26.9 % (ref 14.0–49.0)
MCH: 37.9 pg — ABNORMAL HIGH (ref 27.2–33.4)
MCHC: 33.8 g/dL (ref 32.0–36.0)
MCV: 112.2 fL — ABNORMAL HIGH (ref 79.3–98.0)
MONO#: 0.4 10*3/uL (ref 0.1–0.9)
MONO%: 9 % (ref 0.0–14.0)
NEUT#: 2.7 10*3/uL (ref 1.5–6.5)
NEUT%: 59 % (ref 39.0–75.0)
Platelets: 129 10*3/uL — ABNORMAL LOW (ref 140–400)
RBC: 3.27 10*6/uL — ABNORMAL LOW (ref 4.20–5.82)
RDW: 14.5 % (ref 11.0–14.6)
WBC: 4.5 10*3/uL (ref 4.0–10.3)

## 2016-11-18 LAB — LACTATE DEHYDROGENASE: LDH: 221 U/L (ref 125–245)

## 2016-11-18 MED ORDER — SODIUM CHLORIDE 0.9 % IV SOLN
Freq: Once | INTRAVENOUS | Status: AC
  Administered 2016-11-18: 13:00:00 via INTRAVENOUS

## 2016-11-18 MED ORDER — SODIUM CHLORIDE 0.9 % IV SOLN
900.0000 mg | Freq: Once | INTRAVENOUS | Status: AC
  Administered 2016-11-18: 900 mg via INTRAVENOUS
  Filled 2016-11-18: qty 90

## 2016-11-18 NOTE — Progress Notes (Signed)
Per Dr Travis Mcdaniel, [place] to treat with CRT of 2.71

## 2016-11-18 NOTE — Patient Instructions (Signed)
[facility] Discharge Instructions for Patients Receiving Chemotherapy  Today you received the following chemotherapy agents: Soliris   To help prevent nausea and vomiting after your treatment, we encourage you to take your nausea medication as directed.    If you develop nausea and vomiting that is not controlled by your nausea medication, call the clinic.   BELOW ARE SYMPTOMS THAT SHOULD BE REPORTED IMMEDIATELY:  *FEVER GREATER THAN 100.5 F  *CHILLS WITH OR WITHOUT FEVER  NAUSEA AND VOMITING THAT IS NOT CONTROLLED WITH YOUR NAUSEA MEDICATION  *UNUSUAL SHORTNESS OF BREATH  *UNUSUAL BRUISING OR BLEEDING  TENDERNESS IN MOUTH AND THROAT WITH OR WITHOUT PRESENCE OF ULCERS  *URINARY PROBLEMS  *BOWEL PROBLEMS  UNUSUAL RASH Items with * indicate a potential emergency and should be followed up as soon as possible.  Feel free to call the clinic you have any questions or concerns. The clinic phone number is [phone].  Please show the [facility] at check-in to the Emergency Department and triage nurse.

## 2016-11-18 NOTE — Telephone Encounter (Signed)
Gave patient calender per 5/23 los. - scheduled appts per 5/23

## 2016-11-18 NOTE — Progress Notes (Signed)
Per Julien Nordmann it is okay to treat pt today with Solaris and bilirubin result from today.

## 2016-11-18 NOTE — Progress Notes (Signed)
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  PRIOR THERAPY: The patient was previously treated with a tapering dose of prednisone and high-dose IVIG for the hemolytic anemia before his diagnosis of paroxysmal nocturnal hemoglobinuria.  CURRENT THERAPY: Soliris (Eculizumab) initially with induction 600 MG IV weekly for 4 weeks and currently on maintenance treatment 900 MG IV every 2 weeks. First dose of treatment was given on 11/21/2014. Status post 26 cycles.  INTERVAL HISTORY: Travis Mcdaniel 46 y.o. male returns to the clinic today for follow-up visit. The patient is feeling fine today with no specific complaints. He denied having any weight loss or night sweats. He has no nausea, vomiting, diarrhea or constipation. He has no fever or chills. He denied having any chest pain, shortness of breath, cough or hemoptysis. He is tolerating his current treatment with Soliris fairly well. He is here today for evaluation before starting cycle #27.   MEDICAL HISTORY: Past Medical History:  Diagnosis Date  . Diverticulosis   . Encounter for antineoplastic chemotherapy 01/15/2016  . Hypertension     ALLERGIES:  has No Known Allergies.  MEDICATIONS:  Current Outpatient Prescriptions  Medication Sig Dispense Refill  . ALPRAZolam (XANAX) 1 MG tablet Take 1 mg by mouth 3 (three) times daily.  2  . aspirin EC 81 MG tablet Take 81 mg by mouth.    [name] Kitchen lisinopril-hydrochlorothiazide (PRINZIDE,ZESTORETIC) 20-25 MG tablet Take 1 tablet by mouth daily.  1  . Multiple Vitamin (MULTIVITAMIN WITH MINERALS) TABS tablet Take 1 tablet by mouth daily.    . Psyllium (METAMUCIL PO) Take 1 capsule by mouth daily.    . Vitamins/Minerals TABS Take by mouth.     No current facility-administered medications for  this visit.     SURGICAL HISTORY: No past surgical history on file.  REVIEW OF SYSTEMS:  A comprehensive review of systems was negative.   PHYSICAL EXAMINATION: General appearance: alert, cooperative and no distress Head: Normocephalic, without obvious abnormality, atraumatic Neck: no adenopathy, no JVD, supple, symmetrical, trachea midline and thyroid not enlarged, symmetric, no tenderness/mass/nodules Lymph nodes: Cervical, supraclavicular, and axillary nodes normal. Resp: clear to auscultation bilaterally Back: symmetric, no curvature. ROM normal. No CVA tenderness. Cardio: regular rate and rhythm, S1, S2 normal, no murmur, click, rub or gallop GI: soft, non-tender; bowel sounds normal; no masses,  no organomegaly Extremities: extremities normal, atraumatic, no cyanosis or edema  ECOG PERFORMANCE STATUS: 0 - Asymptomatic  Blood pressure 116/67, pulse 60, temperature 98.4 F (36.9 C), temperature source Oral, resp. rate 18, height 5\' 6"  (1.676 m), weight 170 lb 1.6 oz (77.2 kg), SpO2 100 %.  LABORATORY DATA: Lab Results  Component Value Date   WBC 4.5 11/18/2016   HGB 12.4 (L) 11/18/2016   HCT 36.7 (L) 11/18/2016   MCV 112.2 (H) 11/18/2016   PLT 129 (L) 11/18/2016      Chemistry      Component Value Date/Time   NA 138 11/18/2016 1131   K 4.0 11/18/2016 1131   CL 110 09/07/2014 0407   CO2 23 11/18/2016 1131   BUN 18.0 11/18/2016 1131   CREATININE 0.9 11/18/2016 1131      Component Value Date/Time   CALCIUM 9.2 11/18/2016 1131   ALKPHOS 62 11/18/2016 1131   AST 37 (H) 11/18/2016 1131   ALT 44  11/18/2016 1131   BILITOT 2.71 (H) 11/18/2016 1131       RADIOGRAPHIC STUDIES: No results found.  ASSESSMENT AND PLAN:  This is a very pleasant 46 years old white male with paroxysmal nocturnal hemoglobinuria. The patient is currently on treatment with Soliris every 2 weeks and tolerating his treatment fairly well. He is status post 26 cycles. I recommended for him to  proceed with cycle #27 today as scheduled. He would come back for follow-up visit in 2 weeks for reevaluation before the next cycle of his treatment. We'll continue to monitor his blood count and bilirubin level closely. The patient was advised to call immediately if he has any concerning symptoms in the interval. The patient voices understanding of current disease status and treatment options and is in agreement with the current care plan. All questions were answered. The patient knows to call the clinic with any problems, questions or concerns. We can certainly see the patient much sooner if necessary. I spent 10 minutes counseling the patient face to face. The total time spent in the appointment was 15 minutes.  Disclaimer: This note was dictated with voice recognition software. Similar sounding words can inadvertently be transcribed and may not be corrected upon review.

## 2016-12-01 ENCOUNTER — Other Ambulatory Visit: Payer: Self-pay | Admitting: *Deleted

## 2016-12-02 ENCOUNTER — Ambulatory Visit (HOSPITAL_BASED_OUTPATIENT_CLINIC_OR_DEPARTMENT_OTHER): Payer: BLUE CROSS/BLUE SHIELD

## 2016-12-02 ENCOUNTER — Other Ambulatory Visit (HOSPITAL_BASED_OUTPATIENT_CLINIC_OR_DEPARTMENT_OTHER): Payer: BLUE CROSS/BLUE SHIELD

## 2016-12-02 VITALS — BP 112/77 | HR 54 | Temp 98.0°F | Resp 17

## 2016-12-02 DIAGNOSIS — D595 Paroxysmal nocturnal hemoglobinuria [Marchiafava-Micheli]: Secondary | ICD-10-CM | POA: Diagnosis not present

## 2016-12-02 DIAGNOSIS — Z5112 Encounter for antineoplastic immunotherapy: Secondary | ICD-10-CM | POA: Diagnosis not present

## 2016-12-02 LAB — COMPREHENSIVE METABOLIC PANEL
ALT: 49 U/L (ref 0–55)
ANION GAP: 11 meq/L (ref 3–11)
AST: 36 U/L — ABNORMAL HIGH (ref 5–34)
Albumin: 4.4 g/dL (ref 3.5–5.0)
Alkaline Phosphatase: 59 U/L (ref 40–150)
BUN: 12.6 mg/dL (ref 7.0–26.0)
CALCIUM: 9.2 mg/dL (ref 8.4–10.4)
CO2: 23 meq/L (ref 22–29)
Chloride: 105 mEq/L (ref 98–109)
Creatinine: 0.9 mg/dL (ref 0.7–1.3)
Glucose: 92 mg/dl (ref 70–140)
Potassium: 3.9 mEq/L (ref 3.5–5.1)
Sodium: 139 mEq/L (ref 136–145)
TOTAL PROTEIN: 7 g/dL (ref 6.4–8.3)
Total Bilirubin: 3.27 mg/dL — ABNORMAL HIGH (ref 0.20–1.20)

## 2016-12-02 LAB — CBC WITH DIFFERENTIAL/PLATELET
BASO%: 0.4 % (ref 0.0–2.0)
Basophils Absolute: 0 10*3/uL (ref 0.0–0.1)
EOS ABS: 0.1 10*3/uL (ref 0.0–0.5)
EOS%: 2.9 % (ref 0.0–7.0)
HEMATOCRIT: 34.7 % — AB (ref 38.4–49.9)
HGB: 11.6 g/dL — ABNORMAL LOW (ref 13.0–17.1)
LYMPH%: 24 % (ref 14.0–49.0)
MCH: 38 pg — ABNORMAL HIGH (ref 27.2–33.4)
MCHC: 33.4 g/dL (ref 32.0–36.0)
MCV: 113.8 fL — AB (ref 79.3–98.0)
MONO#: 0.5 10*3/uL (ref 0.1–0.9)
MONO%: 9.2 % (ref 0.0–14.0)
NEUT#: 3.1 10*3/uL (ref 1.5–6.5)
NEUT%: 63.5 % (ref 39.0–75.0)
PLATELETS: 151 10*3/uL (ref 140–400)
RBC: 3.05 10*6/uL — AB (ref 4.20–5.82)
RDW: 15.2 % — ABNORMAL HIGH (ref 11.0–14.6)
WBC: 4.9 10*3/uL (ref 4.0–10.3)
lymph#: 1.2 10*3/uL (ref 0.9–3.3)

## 2016-12-02 LAB — LACTATE DEHYDROGENASE: LDH: 218 U/L (ref 125–245)

## 2016-12-02 MED ORDER — SODIUM CHLORIDE 0.9 % IV SOLN
Freq: Once | INTRAVENOUS | Status: AC
  Administered 2016-12-02: 15:00:00 via INTRAVENOUS

## 2016-12-02 MED ORDER — ECULIZUMAB 300 MG/30ML IV SOLN
900.0000 mg | Freq: Once | INTRAVENOUS | Status: AC
  Administered 2016-12-02: 900 mg via INTRAVENOUS
  Filled 2016-12-02: qty 90

## 2016-12-02 NOTE — Patient Instructions (Signed)
[facility] Discharge Instructions for Patients Receiving Chemotherapy  Today you received the following chemotherapy agents :  Soliris.  To help prevent nausea and vomiting after your treatment, we encourage you to take your nausea medication as prescribed.   If you develop nausea and vomiting that is not controlled by your nausea medication, call the clinic.   BELOW ARE SYMPTOMS THAT SHOULD BE REPORTED IMMEDIATELY:  *FEVER GREATER THAN 100.5 F  *CHILLS WITH OR WITHOUT FEVER  NAUSEA AND VOMITING THAT IS NOT CONTROLLED WITH YOUR NAUSEA MEDICATION  *UNUSUAL SHORTNESS OF BREATH  *UNUSUAL BRUISING OR BLEEDING  TENDERNESS IN MOUTH AND THROAT WITH OR WITHOUT PRESENCE OF ULCERS  *URINARY PROBLEMS  *BOWEL PROBLEMS  UNUSUAL RASH Items with * indicate a potential emergency and should be followed up as soon as possible.  Feel free to call the clinic you have any questions or concerns. The clinic phone number is [phone].  Please show the [facility] at check-in to the Emergency Department and triage nurse.

## 2016-12-02 NOTE — Progress Notes (Signed)
Pt refused to stay for post 1 hour observation after completion of Soliris.  Pt was stable at discharge by self ambulating with steady gait.

## 2016-12-16 ENCOUNTER — Encounter: Payer: Self-pay | Admitting: Internal Medicine

## 2016-12-16 ENCOUNTER — Ambulatory Visit (HOSPITAL_BASED_OUTPATIENT_CLINIC_OR_DEPARTMENT_OTHER): Payer: BLUE CROSS/BLUE SHIELD

## 2016-12-16 ENCOUNTER — Other Ambulatory Visit (HOSPITAL_BASED_OUTPATIENT_CLINIC_OR_DEPARTMENT_OTHER): Payer: BLUE CROSS/BLUE SHIELD

## 2016-12-16 ENCOUNTER — Telehealth: Payer: Self-pay | Admitting: Internal Medicine

## 2016-12-16 ENCOUNTER — Ambulatory Visit (HOSPITAL_BASED_OUTPATIENT_CLINIC_OR_DEPARTMENT_OTHER): Payer: BLUE CROSS/BLUE SHIELD | Admitting: Internal Medicine

## 2016-12-16 VITALS — BP 115/62 | HR 65 | Temp 97.7°F | Resp 20 | Ht 66.0 in | Wt 170.8 lb

## 2016-12-16 DIAGNOSIS — Z5112 Encounter for antineoplastic immunotherapy: Secondary | ICD-10-CM | POA: Diagnosis not present

## 2016-12-16 DIAGNOSIS — D595 Paroxysmal nocturnal hemoglobinuria [Marchiafava-Micheli]: Secondary | ICD-10-CM

## 2016-12-16 DIAGNOSIS — Z5111 Encounter for antineoplastic chemotherapy: Secondary | ICD-10-CM

## 2016-12-16 LAB — COMPREHENSIVE METABOLIC PANEL
ALBUMIN: 4.3 g/dL (ref 3.5–5.0)
ALK PHOS: 61 U/L (ref 40–150)
ALT: 36 U/L (ref 0–55)
ANION GAP: 10 meq/L (ref 3–11)
AST: 29 U/L (ref 5–34)
BILIRUBIN TOTAL: 3.24 mg/dL — AB (ref 0.20–1.20)
BUN: 15 mg/dL (ref 7.0–26.0)
CO2: 22 meq/L (ref 22–29)
CREATININE: 0.9 mg/dL (ref 0.7–1.3)
Calcium: 9 mg/dL (ref 8.4–10.4)
Chloride: 102 mEq/L (ref 98–109)
Glucose: 92 mg/dl (ref 70–140)
Potassium: 4.1 mEq/L (ref 3.5–5.1)
Sodium: 135 mEq/L — ABNORMAL LOW (ref 136–145)
TOTAL PROTEIN: 6.9 g/dL (ref 6.4–8.3)

## 2016-12-16 LAB — CBC WITH DIFFERENTIAL/PLATELET
BASO%: 0.6 % (ref 0.0–2.0)
Basophils Absolute: 0 10*3/uL (ref 0.0–0.1)
EOS ABS: 0.2 10*3/uL (ref 0.0–0.5)
EOS%: 3.7 % (ref 0.0–7.0)
HEMATOCRIT: 33.5 % — AB (ref 38.4–49.9)
HEMOGLOBIN: 11.5 g/dL — AB (ref 13.0–17.1)
LYMPH#: 1.1 10*3/uL (ref 0.9–3.3)
LYMPH%: 22.5 % (ref 14.0–49.0)
MCH: 39.2 pg — ABNORMAL HIGH (ref 27.2–33.4)
MCHC: 34.3 g/dL (ref 32.0–36.0)
MCV: 114.4 fL — ABNORMAL HIGH (ref 79.3–98.0)
MONO#: 0.5 10*3/uL (ref 0.1–0.9)
MONO%: 10.1 % (ref 0.0–14.0)
NEUT%: 63.1 % (ref 39.0–75.0)
NEUTROS ABS: 3 10*3/uL (ref 1.5–6.5)
PLATELETS: 137 10*3/uL — AB (ref 140–400)
RBC: 2.93 10*6/uL — ABNORMAL LOW (ref 4.20–5.82)
RDW: 15.6 % — ABNORMAL HIGH (ref 11.0–14.6)
WBC: 4.7 10*3/uL (ref 4.0–10.3)

## 2016-12-16 LAB — LACTATE DEHYDROGENASE: LDH: 221 U/L (ref 125–245)

## 2016-12-16 MED ORDER — SODIUM CHLORIDE 0.9 % IV SOLN
Freq: Once | INTRAVENOUS | Status: AC
  Administered 2016-12-16: 14:00:00 via INTRAVENOUS

## 2016-12-16 MED ORDER — SODIUM CHLORIDE 0.9 % IV SOLN
900.0000 mg | Freq: Once | INTRAVENOUS | Status: AC
  Administered 2016-12-16: 900 mg via INTRAVENOUS
  Filled 2016-12-16: qty 90

## 2016-12-16 NOTE — Patient Instructions (Signed)
[facility] Discharge Instructions for Patients Receiving Chemotherapy  Today you received the following chemotherapy agents:  Soliris  To help prevent nausea and vomiting after your treatment, we encourage you to take your nausea medication as directed.   If you develop nausea and vomiting that is not controlled by your nausea medication, call the clinic.   BELOW ARE SYMPTOMS THAT SHOULD BE REPORTED IMMEDIATELY:  *FEVER GREATER THAN 100.5 F  *CHILLS WITH OR WITHOUT FEVER  NAUSEA AND VOMITING THAT IS NOT CONTROLLED WITH YOUR NAUSEA MEDICATION  *UNUSUAL SHORTNESS OF BREATH  *UNUSUAL BRUISING OR BLEEDING  TENDERNESS IN MOUTH AND THROAT WITH OR WITHOUT PRESENCE OF ULCERS  *URINARY PROBLEMS  *BOWEL PROBLEMS  UNUSUAL RASH Items with * indicate a potential emergency and should be followed up as soon as possible.  Feel free to call the clinic you have any questions or concerns. The clinic phone number is [phone].  Please show the [facility] at check-in to the Emergency Department and triage nurse.

## 2016-12-16 NOTE — Progress Notes (Signed)
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  PRIOR THERAPY: The patient was previously treated with a tapering dose of prednisone and high-dose IVIG for the hemolytic anemia before his diagnosis of paroxysmal nocturnal hemoglobinuria.  CURRENT THERAPY: Soliris (Eculizumab) initially with induction 600 MG IV weekly for 4 weeks and currently on maintenance treatment 900 MG IV every 2 weeks. First dose of treatment was given on 11/21/2014. Status post 27 cycles.  INTERVAL HISTORY: Travis Mcdaniel 46 y.o. male returns to the clinic today for follow-up visit. The patient is feeling fine with no specific complaints. He continues to tolerate his treatment with Soliris fairly well. He denied having any chest pain, shortness of breath, cough or hemoptysis. He has no nausea, vomiting, diarrhea or constipation. He has no fever or chills. The patient is here today for evaluation before starting cycle #28.   MEDICAL HISTORY: Past Medical History:  Diagnosis Date  . Diverticulosis   . Encounter for antineoplastic chemotherapy 01/15/2016  . Hypertension     ALLERGIES:  has No Known Allergies.  MEDICATIONS:  Current Outpatient Prescriptions  Medication Sig Dispense Refill  . ALPRAZolam (XANAX) 1 MG tablet Take 1 mg by mouth 3 (three) times daily.  2  . aspirin EC 81 MG tablet Take 81 mg by mouth.    [name] Kitchen lisinopril-hydrochlorothiazide (PRINZIDE,ZESTORETIC) 20-25 MG tablet Take 1 tablet by mouth daily.  1  . Multiple Vitamin (MULTIVITAMIN WITH MINERALS) TABS tablet Take 1 tablet by mouth daily.    . Psyllium (METAMUCIL PO) Take 1 capsule by mouth daily.    . Vitamins/Minerals TABS Take by mouth.     No current facility-administered medications for this visit.     SURGICAL HISTORY: History  reviewed. No pertinent surgical history.  REVIEW OF SYSTEMS:  A comprehensive review of systems was negative.   PHYSICAL EXAMINATION: General appearance: alert, cooperative and no distress Head: Normocephalic, without obvious abnormality, atraumatic Neck: no adenopathy, no JVD, supple, symmetrical, trachea midline and thyroid not enlarged, symmetric, no tenderness/mass/nodules Lymph nodes: Cervical, supraclavicular, and axillary nodes normal. Resp: clear to auscultation bilaterally Back: symmetric, no curvature. ROM normal. No CVA tenderness. Cardio: regular rate and rhythm, S1, S2 normal, no murmur, click, rub or gallop GI: soft, non-tender; bowel sounds normal; no masses,  no organomegaly Extremities: extremities normal, atraumatic, no cyanosis or edema  ECOG PERFORMANCE STATUS: 0 - Asymptomatic  Blood pressure 115/62, pulse 65, temperature 97.7 F (36.5 C), temperature source Oral, resp. rate 20, height 5\' 6"  (1.676 m), weight 170 lb 12.8 oz (77.5 kg), SpO2 100 %.  LABORATORY DATA: Lab Results  Component Value Date   WBC 4.7 12/16/2016   HGB 11.5 (L) 12/16/2016   HCT 33.5 (L) 12/16/2016   MCV 114.4 (H) 12/16/2016   PLT 137 (L) 12/16/2016      Chemistry      Component Value Date/Time   NA 135 (L) 12/16/2016 1128   K 4.1 12/16/2016 1128   CL 110 09/07/2014 0407   CO2 22 12/16/2016 1128   BUN 15.0 12/16/2016 1128   CREATININE 0.9 12/16/2016 1128      Component Value Date/Time   CALCIUM 9.0 12/16/2016 1128   ALKPHOS 61 12/16/2016 1128   AST 29 12/16/2016 1128   ALT 36 12/16/2016 1128   BILITOT 3.24 (H) 12/16/2016 1128  RADIOGRAPHIC STUDIES: No results found.  ASSESSMENT AND PLAN:  This is a very pleasant 46 years old white male with paroxysmal nocturnal hemoglobinuria and currently on treatment with Soliris on days 1 and 15 every 4 weeks status post 27 cycles. He is tolerating his treatment well with no significant adverse effects. I recommended for the  patient to proceed with cycle #28 as a scheduled. I will see him back for follow-up visit in 4 weeks for evaluation before starting cycle #29. He was advised to call immediately if he has any concerning symptoms in the interval. The patient voices understanding of current disease status and treatment options and is in agreement with the current care plan. All questions were answered. The patient knows to call the clinic with any problems, questions or concerns. We can certainly see the patient much sooner if necessary. I spent 10 minutes counseling the patient face to face. The total time spent in the appointment was 15 minutes.  Disclaimer: This note was dictated with voice recognition software. Similar sounding words can inadvertently be transcribed and may not be corrected upon review.

## 2016-12-16 NOTE — Telephone Encounter (Signed)
Scheduled appt per 6/20 los - patient to get new schedule in the treatment area  

## 2016-12-31 ENCOUNTER — Ambulatory Visit (HOSPITAL_BASED_OUTPATIENT_CLINIC_OR_DEPARTMENT_OTHER): Payer: BLUE CROSS/BLUE SHIELD

## 2016-12-31 ENCOUNTER — Other Ambulatory Visit (HOSPITAL_BASED_OUTPATIENT_CLINIC_OR_DEPARTMENT_OTHER): Payer: BLUE CROSS/BLUE SHIELD

## 2016-12-31 VITALS — BP 122/63 | HR 68 | Temp 98.1°F | Resp 18

## 2016-12-31 DIAGNOSIS — Z5112 Encounter for antineoplastic immunotherapy: Secondary | ICD-10-CM | POA: Diagnosis not present

## 2016-12-31 DIAGNOSIS — D595 Paroxysmal nocturnal hemoglobinuria [Marchiafava-Micheli]: Secondary | ICD-10-CM

## 2016-12-31 LAB — CBC WITH DIFFERENTIAL/PLATELET
BASO%: 0.6 % (ref 0.0–2.0)
Basophils Absolute: 0 10*3/uL (ref 0.0–0.1)
EOS%: 3.6 % (ref 0.0–7.0)
Eosinophils Absolute: 0.2 10*3/uL (ref 0.0–0.5)
HEMATOCRIT: 34 % — AB (ref 38.4–49.9)
HEMOGLOBIN: 11.3 g/dL — AB (ref 13.0–17.1)
LYMPH#: 1.2 10*3/uL (ref 0.9–3.3)
LYMPH%: 24.6 % (ref 14.0–49.0)
MCH: 37.7 pg — ABNORMAL HIGH (ref 27.2–33.4)
MCHC: 33.2 g/dL (ref 32.0–36.0)
MCV: 113.3 fL — ABNORMAL HIGH (ref 79.3–98.0)
MONO#: 0.4 10*3/uL (ref 0.1–0.9)
MONO%: 8.1 % (ref 0.0–14.0)
NEUT%: 63.1 % (ref 39.0–75.0)
NEUTROS ABS: 3 10*3/uL (ref 1.5–6.5)
Platelets: 133 10*3/uL — ABNORMAL LOW (ref 140–400)
RBC: 3 10*6/uL — ABNORMAL LOW (ref 4.20–5.82)
RDW: 14.5 % (ref 11.0–14.6)
WBC: 4.7 10*3/uL (ref 4.0–10.3)

## 2016-12-31 LAB — LACTATE DEHYDROGENASE: LDH: 220 U/L (ref 125–245)

## 2016-12-31 LAB — COMPREHENSIVE METABOLIC PANEL
ALBUMIN: 4.4 g/dL (ref 3.5–5.0)
ALK PHOS: 61 U/L (ref 40–150)
ALT: 35 U/L (ref 0–55)
AST: 27 U/L (ref 5–34)
Anion Gap: 9 mEq/L (ref 3–11)
BUN: 18.4 mg/dL (ref 7.0–26.0)
CALCIUM: 9.2 mg/dL (ref 8.4–10.4)
CO2: 22 mEq/L (ref 22–29)
CREATININE: 0.9 mg/dL (ref 0.7–1.3)
Chloride: 105 mEq/L (ref 98–109)
EGFR: >90 mL/min/{1.73_m2} (ref >90)
GLUCOSE: 89 mg/dL (ref 70–140)
POTASSIUM: 4 meq/L (ref 3.5–5.1)
SODIUM: 135 meq/L — AB (ref 136–145)
Total Bilirubin: 2.87 mg/dL — ABNORMAL HIGH (ref 0.20–1.20)
Total Protein: 6.9 g/dL (ref 6.4–8.3)

## 2016-12-31 MED ORDER — SODIUM CHLORIDE 0.9 % IV SOLN
900.0000 mg | Freq: Once | INTRAVENOUS | Status: AC
  Administered 2016-12-31: 900 mg via INTRAVENOUS
  Filled 2016-12-31: qty 90

## 2016-12-31 MED ORDER — SODIUM CHLORIDE 0.9 % IV SOLN
Freq: Once | INTRAVENOUS | Status: AC
  Administered 2016-12-31: 14:00:00 via INTRAVENOUS

## 2016-12-31 NOTE — Patient Instructions (Signed)
[facility] Discharge Instructions for Patients Receiving Chemotherapy  Today you received the following chemotherapy agents Soliris  To help prevent nausea and vomiting after your treatment, we encourage you to take your nausea medication as directed If you develop nausea and vomiting that is not controlled by your nausea medication, call the clinic.   BELOW ARE SYMPTOMS THAT SHOULD BE REPORTED IMMEDIATELY:  *FEVER GREATER THAN 100.5 F  *CHILLS WITH OR WITHOUT FEVER  NAUSEA AND VOMITING THAT IS NOT CONTROLLED WITH YOUR NAUSEA MEDICATION  *UNUSUAL SHORTNESS OF BREATH  *UNUSUAL BRUISING OR BLEEDING  TENDERNESS IN MOUTH AND THROAT WITH OR WITHOUT PRESENCE OF ULCERS  *URINARY PROBLEMS  *BOWEL PROBLEMS  UNUSUAL RASH Items with * indicate a potential emergency and should be followed up as soon as possible.  Feel free to call the clinic you have any questions or concerns. The clinic phone number is [phone].  Please show the [facility] at check-in to the Emergency Department and triage nurse.

## 2017-01-13 ENCOUNTER — Other Ambulatory Visit (HOSPITAL_BASED_OUTPATIENT_CLINIC_OR_DEPARTMENT_OTHER): Payer: BLUE CROSS/BLUE SHIELD

## 2017-01-13 ENCOUNTER — Ambulatory Visit (HOSPITAL_BASED_OUTPATIENT_CLINIC_OR_DEPARTMENT_OTHER): Payer: BLUE CROSS/BLUE SHIELD | Admitting: Internal Medicine

## 2017-01-13 ENCOUNTER — Ambulatory Visit (HOSPITAL_BASED_OUTPATIENT_CLINIC_OR_DEPARTMENT_OTHER): Payer: BLUE CROSS/BLUE SHIELD

## 2017-01-13 ENCOUNTER — Encounter: Payer: Self-pay | Admitting: Internal Medicine

## 2017-01-13 VITALS — BP 122/63 | HR 66 | Temp 98.4°F | Resp 18 | Ht 66.0 in | Wt 170.2 lb

## 2017-01-13 DIAGNOSIS — D595 Paroxysmal nocturnal hemoglobinuria [Marchiafava-Micheli]: Secondary | ICD-10-CM | POA: Diagnosis not present

## 2017-01-13 DIAGNOSIS — D696 Thrombocytopenia, unspecified: Secondary | ICD-10-CM

## 2017-01-13 DIAGNOSIS — Z5112 Encounter for antineoplastic immunotherapy: Secondary | ICD-10-CM

## 2017-01-13 LAB — CBC WITH DIFFERENTIAL/PLATELET
BASO%: 0.5 % (ref 0.0–2.0)
BASOS ABS: 0 10*3/uL (ref 0.0–0.1)
EOS%: 3.9 % (ref 0.0–7.0)
Eosinophils Absolute: 0.2 10*3/uL (ref 0.0–0.5)
HEMATOCRIT: 36.2 % — AB (ref 38.4–49.9)
HEMOGLOBIN: 11.9 g/dL — AB (ref 13.0–17.1)
LYMPH#: 1.1 10*3/uL (ref 0.9–3.3)
LYMPH%: 25.1 % (ref 14.0–49.0)
MCH: 37.9 pg — ABNORMAL HIGH (ref 27.2–33.4)
MCHC: 32.9 g/dL (ref 32.0–36.0)
MCV: 115.3 fL — ABNORMAL HIGH (ref 79.3–98.0)
MONO#: 0.4 10*3/uL (ref 0.1–0.9)
MONO%: 9.9 % (ref 0.0–14.0)
NEUT#: 2.6 10*3/uL (ref 1.5–6.5)
NEUT%: 60.6 % (ref 39.0–75.0)
Platelets: 129 10*3/uL — ABNORMAL LOW (ref 140–400)
RBC: 3.14 10*6/uL — ABNORMAL LOW (ref 4.20–5.82)
RDW: 15.2 % — AB (ref 11.0–14.6)
WBC: 4.4 10*3/uL (ref 4.0–10.3)

## 2017-01-13 LAB — COMPREHENSIVE METABOLIC PANEL
ALBUMIN: 4.4 g/dL (ref 3.5–5.0)
ALK PHOS: 57 U/L (ref 40–150)
ALT: 44 U/L (ref 0–55)
AST: 34 U/L (ref 5–34)
Anion Gap: 11 mEq/L (ref 3–11)
BUN: 14 mg/dL (ref 7.0–26.0)
CALCIUM: 9.3 mg/dL (ref 8.4–10.4)
CO2: 21 mEq/L — ABNORMAL LOW (ref 22–29)
CREATININE: 0.9 mg/dL (ref 0.7–1.3)
Chloride: 106 mEq/L (ref 98–109)
EGFR: >90 mL/min/{1.73_m2} (ref >90)
Glucose: 91 mg/dl (ref 70–140)
Potassium: 4 mEq/L (ref 3.5–5.1)
Sodium: 138 mEq/L (ref 136–145)
TOTAL PROTEIN: 7.1 g/dL (ref 6.4–8.3)
Total Bilirubin: 2.79 mg/dL — ABNORMAL HIGH (ref 0.20–1.20)

## 2017-01-13 LAB — LACTATE DEHYDROGENASE: LDH: 230 U/L (ref 125–245)

## 2017-01-13 MED ORDER — ECULIZUMAB 300 MG/30ML IV SOLN
900.0000 mg | Freq: Once | INTRAVENOUS | Status: AC
  Administered 2017-01-13: 900 mg via INTRAVENOUS
  Filled 2017-01-13: qty 90

## 2017-01-13 MED ORDER — SODIUM CHLORIDE 0.9 % IV SOLN
Freq: Once | INTRAVENOUS | Status: AC
  Administered 2017-01-13: 15:00:00 via INTRAVENOUS

## 2017-01-13 NOTE — Progress Notes (Signed)
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  PRIOR THERAPY: The patient was previously treated with a tapering dose of prednisone and high-dose IVIG for the hemolytic anemia before his diagnosis of paroxysmal nocturnal hemoglobinuria.  CURRENT THERAPY: Soliris (Eculizumab) initially with induction 600 MG IV weekly for 4 weeks and currently on maintenance treatment 900 MG IV every 2 weeks. First dose of treatment was given on 11/21/2014. Status post 28 cycles.  INTERVAL HISTORY: Travis Mcdaniel 46 y.o. male returns to the clinic today for follow-up visit. The patient is feeling fine with no specific complaints. He denied having any fatigue or weakness. He denied having any chest pain, shortness of breath, cough or hemoptysis. He has no nausea, vomiting, diarrhea or constipation. He has no significant weight loss or night sweats. He continues to tolerate his treatment with Soliris fairly well. He is here today for evaluation before starting cycle #29.   MEDICAL HISTORY: Past Medical History:  Diagnosis Date  . Diverticulosis   . Encounter for antineoplastic chemotherapy 01/15/2016  . Hypertension     ALLERGIES:  has No Known Allergies.  MEDICATIONS:  Current Outpatient Prescriptions  Medication Sig Dispense Refill  . ALPRAZolam (XANAX) 1 MG tablet Take 1 mg by mouth 3 (three) times daily.  2  . aspirin EC 81 MG tablet Take 81 mg by mouth.    [name] Kitchen lisinopril-hydrochlorothiazide (PRINZIDE,ZESTORETIC) 20-25 MG tablet Take 1 tablet by mouth daily.  1  . Multiple Vitamin (MULTIVITAMIN WITH MINERALS) TABS tablet Take 1 tablet by mouth daily.    . Psyllium (METAMUCIL PO) Take 1 capsule by mouth daily.    . Vitamins/Minerals TABS Take by mouth.     No current facility-administered  medications for this visit.     SURGICAL HISTORY: History reviewed. No pertinent surgical history.  REVIEW OF SYSTEMS:  A comprehensive review of systems was negative.   PHYSICAL EXAMINATION: General appearance: alert, cooperative and no distress Head: Normocephalic, without obvious abnormality, atraumatic Neck: no adenopathy, no JVD, supple, symmetrical, trachea midline and thyroid not enlarged, symmetric, no tenderness/mass/nodules Lymph nodes: Cervical, supraclavicular, and axillary nodes normal. Resp: clear to auscultation bilaterally Back: symmetric, no curvature. ROM normal. No CVA tenderness. Cardio: regular rate and rhythm, S1, S2 normal, no murmur, click, rub or gallop GI: soft, non-tender; bowel sounds normal; no masses,  no organomegaly Extremities: extremities normal, atraumatic, no cyanosis or edema  ECOG PERFORMANCE STATUS: 0 - Asymptomatic  Blood pressure 122/63, pulse 66, temperature 98.4 F (36.9 C), temperature source Oral, resp. rate 18, height 5\' 6"  (1.676 m), weight 170 lb 3.2 oz (77.2 kg), SpO2 100 %.  LABORATORY DATA: Lab Results  Component Value Date   WBC 4.4 01/13/2017   HGB 11.9 (L) 01/13/2017   HCT 36.2 (L) 01/13/2017   MCV 115.3 (H) 01/13/2017   PLT 129 (L) 01/13/2017      Chemistry      Component Value Date/Time   NA 135 (L) 12/31/2016 1308   K 4.0 12/31/2016 1308   CL 110 09/07/2014 0407   CO2 22 12/31/2016 1308   BUN 18.4 12/31/2016 1308   CREATININE 0.9 12/31/2016 1308      Component Value Date/Time   CALCIUM 9.2 12/31/2016 1308   ALKPHOS 61 12/31/2016 1308   AST 27 12/31/2016 1308   ALT 35  12/31/2016 1308   BILITOT 2.87 (H) 12/31/2016 1308       RADIOGRAPHIC STUDIES: No results found.  ASSESSMENT AND PLAN:  This is a very pleasant 46 years old white male with paroxysmal nocturnal hemoglobinuria and currently on treatment with Soliris on days 1 and 15 every 4 weeks status post 28 cycles. He tolerated the last cycle of his  treatment well. I recommended for the patient to continue his current treatment with Soliris and he will start cycle #29 today. He is tolerating his treatment well with no significant adverse effects. I will see him back for follow-up visit in 4 weeks for evaluation before starting the next dose of his treatment. The patient was advised to call immediately if he has any concerning symptoms in the interval. The patient voices understanding of current disease status and treatment options and is in agreement with the current care plan. All questions were answered. The patient knows to call the clinic with any problems, questions or concerns. We can certainly see the patient much sooner if necessary. I spent 10 minutes counseling the patient face to face. The total time spent in the appointment was 15 minutes.  Disclaimer: This note was dictated with voice recognition software. Similar sounding words can inadvertently be transcribed and may not be corrected upon review.

## 2017-01-13 NOTE — Patient Instructions (Signed)
[facility] Discharge Instructions for Patients Receiving Chemotherapy  Today you received the following chemotherapy agents:  Soliris.  To help prevent nausea and vomiting after your treatment, we encourage you to take your nausea medication as directed.   If you develop nausea and vomiting that is not controlled by your nausea medication, call the clinic.   BELOW ARE SYMPTOMS THAT SHOULD BE REPORTED IMMEDIATELY:  *FEVER GREATER THAN 100.5 F  *CHILLS WITH OR WITHOUT FEVER  NAUSEA AND VOMITING THAT IS NOT CONTROLLED WITH YOUR NAUSEA MEDICATION  *UNUSUAL SHORTNESS OF BREATH  *UNUSUAL BRUISING OR BLEEDING  TENDERNESS IN MOUTH AND THROAT WITH OR WITHOUT PRESENCE OF ULCERS  *URINARY PROBLEMS  *BOWEL PROBLEMS  UNUSUAL RASH Items with * indicate a potential emergency and should be followed up as soon as possible.  Feel free to call the clinic you have any questions or concerns. The clinic phone number is [phone].  Please show the [facility] at check-in to the Emergency Department and triage nurse.

## 2017-01-27 ENCOUNTER — Ambulatory Visit (HOSPITAL_BASED_OUTPATIENT_CLINIC_OR_DEPARTMENT_OTHER): Payer: BLUE CROSS/BLUE SHIELD

## 2017-01-27 ENCOUNTER — Other Ambulatory Visit (HOSPITAL_BASED_OUTPATIENT_CLINIC_OR_DEPARTMENT_OTHER): Payer: BLUE CROSS/BLUE SHIELD

## 2017-01-27 VITALS — BP 105/71 | HR 67 | Temp 98.1°F | Resp 18

## 2017-01-27 DIAGNOSIS — Z5112 Encounter for antineoplastic immunotherapy: Secondary | ICD-10-CM

## 2017-01-27 DIAGNOSIS — D595 Paroxysmal nocturnal hemoglobinuria [Marchiafava-Micheli]: Secondary | ICD-10-CM

## 2017-01-27 LAB — COMPREHENSIVE METABOLIC PANEL
ALBUMIN: 4.3 g/dL (ref 3.5–5.0)
ALK PHOS: 60 U/L (ref 40–150)
ALT: 62 U/L — ABNORMAL HIGH (ref 0–55)
AST: 48 U/L — AB (ref 5–34)
Anion Gap: 9 mEq/L (ref 3–11)
BUN: 18 mg/dL (ref 7.0–26.0)
CO2: 24 mEq/L (ref 22–29)
CREATININE: 0.9 mg/dL (ref 0.7–1.3)
Calcium: 9.2 mg/dL (ref 8.4–10.4)
Chloride: 104 mEq/L (ref 98–109)
EGFR: >90 mL/min/{1.73_m2} (ref >90)
Glucose: 98 mg/dl (ref 70–140)
POTASSIUM: 4.1 meq/L (ref 3.5–5.1)
Sodium: 137 mEq/L (ref 136–145)
Total Bilirubin: 3.69 mg/dL (ref 0.20–1.20)
Total Protein: 7 g/dL (ref 6.4–8.3)

## 2017-01-27 LAB — CBC WITH DIFFERENTIAL/PLATELET
BASO%: 0.4 % (ref 0.0–2.0)
BASOS ABS: 0 10*3/uL (ref 0.0–0.1)
EOS%: 3.9 % (ref 0.0–7.0)
Eosinophils Absolute: 0.2 10*3/uL (ref 0.0–0.5)
HEMATOCRIT: 37.1 % — AB (ref 38.4–49.9)
HEMOGLOBIN: 12.5 g/dL — AB (ref 13.0–17.1)
LYMPH#: 1.1 10*3/uL (ref 0.9–3.3)
LYMPH%: 20.5 % (ref 14.0–49.0)
MCH: 38.9 pg — AB (ref 27.2–33.4)
MCHC: 33.7 g/dL (ref 32.0–36.0)
MCV: 115.6 fL — ABNORMAL HIGH (ref 79.3–98.0)
MONO#: 0.5 10*3/uL (ref 0.1–0.9)
MONO%: 10.4 % (ref 0.0–14.0)
NEUT#: 3.3 10*3/uL (ref 1.5–6.5)
NEUT%: 64.8 % (ref 39.0–75.0)
PLATELETS: 135 10*3/uL — AB (ref 140–400)
RBC: 3.21 10*6/uL — ABNORMAL LOW (ref 4.20–5.82)
RDW: 15.4 % — ABNORMAL HIGH (ref 11.0–14.6)
WBC: 5.1 10*3/uL (ref 4.0–10.3)

## 2017-01-27 LAB — LACTATE DEHYDROGENASE: LDH: 240 U/L (ref 125–245)

## 2017-01-27 MED ORDER — SODIUM CHLORIDE 0.9 % IV SOLN
Freq: Once | INTRAVENOUS | Status: AC
  Administered 2017-01-27: 13:00:00 via INTRAVENOUS

## 2017-01-27 MED ORDER — SODIUM CHLORIDE 0.9 % IV SOLN
900.0000 mg | Freq: Once | INTRAVENOUS | Status: AC
  Administered 2017-01-27: 900 mg via INTRAVENOUS
  Filled 2017-01-27: qty 90

## 2017-01-27 NOTE — Patient Instructions (Signed)
Eculizumab injection (Soliris) What is this medicine? ECULIZUMAB (ek yoo LYE zyoo mab) is a monoclonal antibody. It is used to treat a rare kind of anemia called paroxysmal nocturnal hemoglobinuria or PNH. It may help prevent the loss of blood in patients with PNH. It is also used to treat atypical hemolytic uremic syndrome and myasthenia gravis. This medicine may be used for other purposes; ask your health care provider or pharmacist if you have questions. COMMON BRAND NAME(S): Soliris What should I tell my health care provider before I take this medicine? They need to know if you have any of these conditions: -infection -not received meningococcal vaccine -an unusual or allergic reaction to this eculizumab, other medicines, foods, dyes, or preservatives -pregnant or trying to get pregnant -breast-feeding How should I use this medicine? This medicine is for infusion into a vein. It is given by a health care professional in a hospital or clinic setting. A special MedGuide will be given to you by the pharmacist with each prescription and refill. Be sure to read this information carefully each time. Talk to your pediatrician regarding the use of this medicine in children. Special care may be needed. Overdosage: If you think you have taken too much of this medicine contact a poison control center or emergency room at once. NOTE: This medicine is only for you. Do not share this medicine with others. What if I miss a dose? It is important not to miss your dose. Call your doctor or health care professional if you are unable to keep an appointment. What may interact with this medicine? Interactions are not expected. This list may not describe all possible interactions. Give your health care provider a list of all the medicines, herbs, non-prescription drugs, or dietary supplements you use. Also tell them if you smoke, drink alcohol, or use illegal drugs. Some items may interact with your  medicine. What should I watch for while using this medicine? Your condition will be monitored carefully while you are receiving this medicine. You will need regular blood work. Tell your doctor or healthcare professional if your symptoms do not start to get better or if they get worse. You may have sudden breakdown of your red blood cells after you stop taking this medicine. You will need to be followed by your doctor for 8 weeks or more after this therapy is complete. This medicine may decrease your body's ability to fight infection. Avoid being around people who are sick. Carry the Patient Safety Card given to you at all times. Seek medical help if you have any of the symptoms listed on the card. What side effects may I notice from receiving this medicine? Side effects that you should report to your doctor or health care professional as soon as possible: -allergic reactions like skin rash, itching or hives, swelling of the face, lips, or tongue -back pain -breathing problems -bruising, pinpoint red spots on the skin -confusion -eyes sensitive to light -fast, irregular heartbeat -fever, chills, or any other sign of infection -pain, swelling, warmth in the leg -pain, tingling, numbness in the hands or feet -problems with balance, talking, walking -seizures -stiff neck -unusually weak or tired -vomiting Side effects that usually do not require medical attention (report to your doctor or health care professional if they continue or are bothersome): -aches and pains -constipation -headache -nausea -runny nose or colds -sore throat This list may not describe all possible side effects. Call your doctor for medical advice about side effects. You may report side   effects to FDA at 1-800-FDA-1088. Where should I keep my medicine? This drug is given in a hospital or clinic and will not be stored at home. NOTE: This sheet is a summary. It may not cover all possible information. If you have  questions about this medicine, talk to your doctor, pharmacist, or health care provider.  2018 Elsevier/Gold Standard (2016-04-23 11:17:44)  

## 2017-01-27 NOTE — Progress Notes (Signed)
Patient declines to stay for the 1 hour post observation.

## 2017-02-10 ENCOUNTER — Ambulatory Visit (HOSPITAL_BASED_OUTPATIENT_CLINIC_OR_DEPARTMENT_OTHER): Payer: BLUE CROSS/BLUE SHIELD

## 2017-02-10 ENCOUNTER — Encounter: Payer: Self-pay | Admitting: Internal Medicine

## 2017-02-10 ENCOUNTER — Other Ambulatory Visit (HOSPITAL_BASED_OUTPATIENT_CLINIC_OR_DEPARTMENT_OTHER): Payer: BLUE CROSS/BLUE SHIELD

## 2017-02-10 ENCOUNTER — Ambulatory Visit (HOSPITAL_BASED_OUTPATIENT_CLINIC_OR_DEPARTMENT_OTHER): Payer: BLUE CROSS/BLUE SHIELD | Admitting: Internal Medicine

## 2017-02-10 VITALS — BP 112/63 | HR 68 | Temp 97.8°F | Resp 18 | Ht 66.0 in | Wt 170.3 lb

## 2017-02-10 DIAGNOSIS — D595 Paroxysmal nocturnal hemoglobinuria [Marchiafava-Micheli]: Secondary | ICD-10-CM

## 2017-02-10 DIAGNOSIS — Z5111 Encounter for antineoplastic chemotherapy: Secondary | ICD-10-CM

## 2017-02-10 DIAGNOSIS — Z5112 Encounter for antineoplastic immunotherapy: Secondary | ICD-10-CM

## 2017-02-10 LAB — COMPREHENSIVE METABOLIC PANEL
ALT: 53 U/L (ref 0–55)
ANION GAP: 10 meq/L (ref 3–11)
AST: 35 U/L — AB (ref 5–34)
Albumin: 4.1 g/dL (ref 3.5–5.0)
Alkaline Phosphatase: 56 U/L (ref 40–150)
BUN: 15.4 mg/dL (ref 7.0–26.0)
CALCIUM: 9.1 mg/dL (ref 8.4–10.4)
CO2: 23 mEq/L (ref 22–29)
Chloride: 105 mEq/L (ref 98–109)
Creatinine: 1 mg/dL (ref 0.7–1.3)
EGFR: 84 mL/min/{1.73_m2} — ABNORMAL LOW (ref >90)
Glucose: 108 mg/dl (ref 70–140)
POTASSIUM: 3.9 meq/L (ref 3.5–5.1)
Sodium: 138 mEq/L (ref 136–145)
Total Bilirubin: 3.54 mg/dL (ref 0.20–1.20)
Total Protein: 6.7 g/dL (ref 6.4–8.3)

## 2017-02-10 LAB — CBC WITH DIFFERENTIAL/PLATELET
BASO%: 0.5 % (ref 0.0–2.0)
BASOS ABS: 0 10*3/uL (ref 0.0–0.1)
EOS%: 3.1 % (ref 0.0–7.0)
Eosinophils Absolute: 0.1 10*3/uL (ref 0.0–0.5)
HEMATOCRIT: 32.9 % — AB (ref 38.4–49.9)
HGB: 11 g/dL — ABNORMAL LOW (ref 13.0–17.1)
LYMPH#: 1 10*3/uL (ref 0.9–3.3)
LYMPH%: 23 % (ref 14.0–49.0)
MCH: 38.6 pg — AB (ref 27.2–33.4)
MCHC: 33.4 g/dL (ref 32.0–36.0)
MCV: 115.4 fL — ABNORMAL HIGH (ref 79.3–98.0)
MONO#: 0.4 10*3/uL (ref 0.1–0.9)
MONO%: 9.4 % (ref 0.0–14.0)
NEUT#: 2.7 10*3/uL (ref 1.5–6.5)
NEUT%: 64 % (ref 39.0–75.0)
Platelets: 133 10*3/uL — ABNORMAL LOW (ref 140–400)
RBC: 2.85 10*6/uL — AB (ref 4.20–5.82)
RDW: 15.1 % — ABNORMAL HIGH (ref 11.0–14.6)
WBC: 4.3 10*3/uL (ref 4.0–10.3)

## 2017-02-10 LAB — LACTATE DEHYDROGENASE: LDH: 227 U/L (ref 125–245)

## 2017-02-10 MED ORDER — SODIUM CHLORIDE 0.9 % IV SOLN
900.0000 mg | Freq: Once | INTRAVENOUS | Status: AC
  Administered 2017-02-10: 900 mg via INTRAVENOUS
  Filled 2017-02-10: qty 90

## 2017-02-10 MED ORDER — SODIUM CHLORIDE 0.9 % IV SOLN
Freq: Once | INTRAVENOUS | Status: AC
  Administered 2017-02-10: 14:00:00 via INTRAVENOUS

## 2017-02-10 NOTE — Patient Instructions (Signed)
[facility] Discharge Instructions for Patients Receiving Chemotherapy  Today you received the following chemotherapy agents:  Soliris.  To help prevent nausea and vomiting after your treatment, we encourage you to take your nausea medication as directed.   If you develop nausea and vomiting that is not controlled by your nausea medication, call the clinic.   BELOW ARE SYMPTOMS THAT SHOULD BE REPORTED IMMEDIATELY:  *FEVER GREATER THAN 100.5 F  *CHILLS WITH OR WITHOUT FEVER  NAUSEA AND VOMITING THAT IS NOT CONTROLLED WITH YOUR NAUSEA MEDICATION  *UNUSUAL SHORTNESS OF BREATH  *UNUSUAL BRUISING OR BLEEDING  TENDERNESS IN MOUTH AND THROAT WITH OR WITHOUT PRESENCE OF ULCERS  *URINARY PROBLEMS  *BOWEL PROBLEMS  UNUSUAL RASH Items with * indicate a potential emergency and should be followed up as soon as possible.  Feel free to call the clinic you have any questions or concerns. The clinic phone number is [phone].  Please show the [facility] at check-in to the Emergency Department and triage nurse.

## 2017-02-10 NOTE — Progress Notes (Signed)
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  PRIOR THERAPY: The patient was previously treated with a tapering dose of prednisone and high-dose IVIG for the hemolytic anemia before his diagnosis of paroxysmal nocturnal hemoglobinuria.  CURRENT THERAPY: Soliris (Eculizumab) initially with induction 600 MG IV weekly for 4 weeks and currently on maintenance treatment 900 MG IV every 2 weeks. First dose of treatment was given on 11/21/2014. Status post 29 cycles.  INTERVAL HISTORY: Travis Mcdaniel 46 y.o. male returns to the clinic today for follow-up visit. The patient is feeling fine today with no specific complaints. He takes Tylenol on a regular basis for arthritis. He denied having any chest pain, shortness breath, cough or hemoptysis. He denied having any fever or chills. He has no nausea, vomiting, diarrhea or constipation. He has no chest pain, shortness of breath, cough or hemoptysis. He has been tolerating his treatment with Soliris fairly well. He is here today for evaluation before starting cycle #30.  MEDICAL HISTORY: Past Medical History:  Diagnosis Date  . Diverticulosis   . Encounter for antineoplastic chemotherapy 01/15/2016  . Hypertension     ALLERGIES:  has No Known Allergies.  MEDICATIONS:  Current Outpatient Prescriptions  Medication Sig Dispense Refill  . ALPRAZolam (XANAX) 1 MG tablet Take 1 mg by mouth 3 (three) times daily.  2  . aspirin EC 81 MG tablet Take 81 mg by mouth.    [name] Kitchen lisinopril-hydrochlorothiazide (PRINZIDE,ZESTORETIC) 20-25 MG tablet Take 1 tablet by mouth daily.  1  . Multiple Vitamin (MULTIVITAMIN WITH MINERALS) TABS tablet Take 1 tablet by mouth daily.    . Psyllium (METAMUCIL PO) Take 1 capsule by mouth daily.    . Vitamins/Minerals TABS  Take by mouth.     No current facility-administered medications for this visit.     SURGICAL HISTORY: History reviewed. No pertinent surgical history.  REVIEW OF SYSTEMS:  A comprehensive review of systems was negative.   PHYSICAL EXAMINATION: General appearance: alert, cooperative and no distress Head: Normocephalic, without obvious abnormality, atraumatic Neck: no adenopathy, no JVD, supple, symmetrical, trachea midline and thyroid not enlarged, symmetric, no tenderness/mass/nodules Lymph nodes: Cervical, supraclavicular, and axillary nodes normal. Resp: clear to auscultation bilaterally Back: symmetric, no curvature. ROM normal. No CVA tenderness. Cardio: regular rate and rhythm, S1, S2 normal, no murmur, click, rub or gallop GI: soft, non-tender; bowel sounds normal; no masses,  no organomegaly Extremities: extremities normal, atraumatic, no cyanosis or edema  ECOG PERFORMANCE STATUS: 0 - Asymptomatic  Blood pressure 112/63, pulse 68, temperature 97.8 F (36.6 C), temperature source Oral, resp. rate 18, height 5\' 6"  (1.676 m), weight 170 lb 4.8 oz (77.2 kg), SpO2 100 %.  LABORATORY DATA: Lab Results  Component Value Date   WBC 4.3 02/10/2017   HGB 11.0 (L) 02/10/2017   HCT 32.9 (L) 02/10/2017   MCV 115.4 (H) 02/10/2017   PLT 133 (L) 02/10/2017      Chemistry      Component Value Date/Time   NA 138 02/10/2017 1251   K 3.9 02/10/2017 1251   CL 110 09/07/2014 0407   CO2 23 02/10/2017 1251   BUN 15.4 02/10/2017 1251   CREATININE 1.0 02/10/2017 1251      Component Value Date/Time   CALCIUM 9.1 02/10/2017 1251   ALKPHOS 56 02/10/2017 1251  AST 35 (H) 02/10/2017 1251   ALT 53 02/10/2017 1251   BILITOT 3.54 (HH) 02/10/2017 1251       RADIOGRAPHIC STUDIES: No results found.  ASSESSMENT AND PLAN:  This is a very pleasant 46 years old white male with paroxysmal nocturnal hemoglobinuria and currently on treatment with Soliris on days 1 and 15 every 4 weeks status  post 29 cycles. He tolerated the last cycle of his treatment well. He continues to have hyperbilirubinemia likely secondary to his disease. I recommended for the patient to continue his treatment with Soliris with the same dose. I will see him back for follow-up visit in 4 weeks for evaluation before starting cycle #31. He was advised to call immediately if he has any concerning symptoms in the interval. The patient voices understanding of current disease status and treatment options and is in agreement with the current care plan. All questions were answered. The patient knows to call the clinic with any problems, questions or concerns. We can certainly see the patient much sooner if necessary. I spent 10 minutes counseling the patient face to face. The total time spent in the appointment was 15 minutes.  Disclaimer: This note was dictated with voice recognition software. Similar sounding words can inadvertently be transcribed and may not be corrected upon review.

## 2017-02-10 NOTE — Progress Notes (Signed)
Per Dr Julien Nordmann it is okay to treat pt with solaris today and today's cmp results.

## 2017-02-24 ENCOUNTER — Ambulatory Visit (HOSPITAL_BASED_OUTPATIENT_CLINIC_OR_DEPARTMENT_OTHER): Payer: BLUE CROSS/BLUE SHIELD

## 2017-02-24 ENCOUNTER — Other Ambulatory Visit (HOSPITAL_BASED_OUTPATIENT_CLINIC_OR_DEPARTMENT_OTHER): Payer: BLUE CROSS/BLUE SHIELD

## 2017-02-24 VITALS — BP 125/74 | HR 58 | Temp 98.0°F | Resp 16

## 2017-02-24 DIAGNOSIS — D595 Paroxysmal nocturnal hemoglobinuria [Marchiafava-Micheli]: Secondary | ICD-10-CM | POA: Diagnosis not present

## 2017-02-24 DIAGNOSIS — Z5112 Encounter for antineoplastic immunotherapy: Secondary | ICD-10-CM | POA: Diagnosis not present

## 2017-02-24 LAB — COMPREHENSIVE METABOLIC PANEL
ALT: 35 U/L (ref 0–55)
AST: 28 U/L (ref 5–34)
Albumin: 4.3 g/dL (ref 3.5–5.0)
Alkaline Phosphatase: 60 U/L (ref 40–150)
Anion Gap: 8 mEq/L (ref 3–11)
BUN: 16.9 mg/dL (ref 7.0–26.0)
CHLORIDE: 106 meq/L (ref 98–109)
CO2: 24 meq/L (ref 22–29)
Calcium: 9.4 mg/dL (ref 8.4–10.4)
Creatinine: 1 mg/dL (ref 0.7–1.3)
EGFR: 88 mL/min/{1.73_m2} — ABNORMAL LOW (ref >90)
GLUCOSE: 93 mg/dL (ref 70–140)
POTASSIUM: 4 meq/L (ref 3.5–5.1)
SODIUM: 138 meq/L (ref 136–145)
Total Bilirubin: 2.85 mg/dL — ABNORMAL HIGH (ref 0.20–1.20)
Total Protein: 7.2 g/dL (ref 6.4–8.3)

## 2017-02-24 LAB — CBC WITH DIFFERENTIAL/PLATELET
BASO%: 0.5 % (ref 0.0–2.0)
BASOS ABS: 0 10*3/uL (ref 0.0–0.1)
EOS%: 4.7 % (ref 0.0–7.0)
Eosinophils Absolute: 0.2 10*3/uL (ref 0.0–0.5)
HCT: 37.9 % — ABNORMAL LOW (ref 38.4–49.9)
HGB: 12.4 g/dL — ABNORMAL LOW (ref 13.0–17.1)
LYMPH%: 27 % (ref 14.0–49.0)
MCH: 37.1 pg — AB (ref 27.2–33.4)
MCHC: 32.7 g/dL (ref 32.0–36.0)
MCV: 113.5 fL — ABNORMAL HIGH (ref 79.3–98.0)
MONO#: 0.4 10*3/uL (ref 0.1–0.9)
MONO%: 10.3 % (ref 0.0–14.0)
NEUT#: 2.3 10*3/uL (ref 1.5–6.5)
NEUT%: 57.5 % (ref 39.0–75.0)
Platelets: 136 10*3/uL — ABNORMAL LOW (ref 140–400)
RBC: 3.34 10*6/uL — ABNORMAL LOW (ref 4.20–5.82)
RDW: 13.8 % (ref 11.0–14.6)
WBC: 4.1 10*3/uL (ref 4.0–10.3)
lymph#: 1.1 10*3/uL (ref 0.9–3.3)

## 2017-02-24 LAB — LACTATE DEHYDROGENASE: LDH: 214 U/L (ref 125–245)

## 2017-02-24 MED ORDER — SODIUM CHLORIDE 0.9 % IV SOLN
900.0000 mg | Freq: Once | INTRAVENOUS | Status: AC
  Administered 2017-02-24: 900 mg via INTRAVENOUS
  Filled 2017-02-24: qty 90

## 2017-02-24 MED ORDER — SODIUM CHLORIDE 0.9 % IV SOLN
Freq: Once | INTRAVENOUS | Status: AC
  Administered 2017-02-24: 14:00:00 via INTRAVENOUS

## 2017-02-24 NOTE — Patient Instructions (Signed)
[facility] Discharge Instructions for Patients Receiving Chemotherapy  Today you received the following chemotherapy agents soliris   To help prevent nausea and vomiting after your treatment, we encourage you to take your nausea medication as directed  If you develop nausea and vomiting that is not controlled by your nausea medication, call the clinic.   BELOW ARE SYMPTOMS THAT SHOULD BE REPORTED IMMEDIATELY:  *FEVER GREATER THAN 100.5 F  *CHILLS WITH OR WITHOUT FEVER  NAUSEA AND VOMITING THAT IS NOT CONTROLLED WITH YOUR NAUSEA MEDICATION  *UNUSUAL SHORTNESS OF BREATH  *UNUSUAL BRUISING OR BLEEDING  TENDERNESS IN MOUTH AND THROAT WITH OR WITHOUT PRESENCE OF ULCERS  *URINARY PROBLEMS  *BOWEL PROBLEMS  UNUSUAL RASH Items with * indicate a potential emergency and should be followed up as soon as possible.  Feel free to call the clinic you have any questions or concerns. The clinic phone number is [phone].

## 2017-03-10 ENCOUNTER — Ambulatory Visit (HOSPITAL_BASED_OUTPATIENT_CLINIC_OR_DEPARTMENT_OTHER): Payer: BLUE CROSS/BLUE SHIELD | Admitting: Oncology

## 2017-03-10 ENCOUNTER — Telehealth: Payer: Self-pay | Admitting: Oncology

## 2017-03-10 ENCOUNTER — Other Ambulatory Visit (HOSPITAL_BASED_OUTPATIENT_CLINIC_OR_DEPARTMENT_OTHER): Payer: BLUE CROSS/BLUE SHIELD

## 2017-03-10 ENCOUNTER — Ambulatory Visit (HOSPITAL_BASED_OUTPATIENT_CLINIC_OR_DEPARTMENT_OTHER): Payer: BLUE CROSS/BLUE SHIELD

## 2017-03-10 ENCOUNTER — Encounter: Payer: Self-pay | Admitting: Oncology

## 2017-03-10 VITALS — BP 115/58 | HR 65 | Temp 98.1°F | Resp 18 | Ht 66.0 in | Wt 171.5 lb

## 2017-03-10 VITALS — BP 123/75

## 2017-03-10 DIAGNOSIS — Z5112 Encounter for antineoplastic immunotherapy: Secondary | ICD-10-CM | POA: Diagnosis not present

## 2017-03-10 DIAGNOSIS — D595 Paroxysmal nocturnal hemoglobinuria [Marchiafava-Micheli]: Secondary | ICD-10-CM | POA: Diagnosis not present

## 2017-03-10 DIAGNOSIS — Z5111 Encounter for antineoplastic chemotherapy: Secondary | ICD-10-CM

## 2017-03-10 LAB — CBC WITH DIFFERENTIAL/PLATELET
BASO%: 0.2 % (ref 0.0–2.0)
Basophils Absolute: 0 10*3/uL (ref 0.0–0.1)
EOS%: 3.8 % (ref 0.0–7.0)
Eosinophils Absolute: 0.2 10*3/uL (ref 0.0–0.5)
HEMATOCRIT: 36.8 % — AB (ref 38.4–49.9)
HGB: 12.3 g/dL — ABNORMAL LOW (ref 13.0–17.1)
LYMPH#: 1.2 10*3/uL (ref 0.9–3.3)
LYMPH%: 26.2 % (ref 14.0–49.0)
MCH: 38 pg — ABNORMAL HIGH (ref 27.2–33.4)
MCHC: 33.4 g/dL (ref 32.0–36.0)
MCV: 113.6 fL — ABNORMAL HIGH (ref 79.3–98.0)
MONO#: 0.4 10*3/uL (ref 0.1–0.9)
MONO%: 8.3 % (ref 0.0–14.0)
NEUT%: 61.5 % (ref 39.0–75.0)
NEUTROS ABS: 2.8 10*3/uL (ref 1.5–6.5)
PLATELETS: 134 10*3/uL — AB (ref 140–400)
RBC: 3.24 10*6/uL — ABNORMAL LOW (ref 4.20–5.82)
RDW: 15 % — AB (ref 11.0–14.6)
WBC: 4.5 10*3/uL (ref 4.0–10.3)

## 2017-03-10 LAB — COMPREHENSIVE METABOLIC PANEL
ALBUMIN: 4.4 g/dL (ref 3.5–5.0)
ALT: 41 U/L (ref 0–55)
ANION GAP: 10 meq/L (ref 3–11)
AST: 31 U/L (ref 5–34)
Alkaline Phosphatase: 58 U/L (ref 40–150)
BILIRUBIN TOTAL: 2.63 mg/dL — AB (ref 0.20–1.20)
BUN: 14 mg/dL (ref 7.0–26.0)
CALCIUM: 9.4 mg/dL (ref 8.4–10.4)
CHLORIDE: 106 meq/L (ref 98–109)
CO2: 24 mEq/L (ref 22–29)
CREATININE: 1 mg/dL (ref 0.7–1.3)
EGFR: 89 mL/min/{1.73_m2} — ABNORMAL LOW (ref >90)
Glucose: 96 mg/dl (ref 70–140)
Potassium: 4.1 mEq/L (ref 3.5–5.1)
Sodium: 140 mEq/L (ref 136–145)
TOTAL PROTEIN: 6.9 g/dL (ref 6.4–8.3)

## 2017-03-10 LAB — LACTATE DEHYDROGENASE: LDH: 217 U/L (ref 125–245)

## 2017-03-10 MED ORDER — TRAMADOL HCL 50 MG PO TABS: 50.0000 mg | ORAL_TABLET | Freq: Two times a day (BID) | ORAL | 0 refills | Status: DC | PRN

## 2017-03-10 MED ORDER — SODIUM CHLORIDE 0.9 % IV SOLN
900.0000 mg | Freq: Once | INTRAVENOUS | Status: AC
  Administered 2017-03-10: 900 mg via INTRAVENOUS
  Filled 2017-03-10: qty 90

## 2017-03-10 MED ORDER — SODIUM CHLORIDE 0.9 % IV SOLN
Freq: Once | INTRAVENOUS | Status: AC
  Administered 2017-03-10: 12:00:00 via INTRAVENOUS

## 2017-03-10 NOTE — Telephone Encounter (Signed)
No 9/12 los.  

## 2017-03-10 NOTE — Patient Instructions (Signed)
[facility] Discharge Instructions for Patients Receiving Chemotherapy  Today you received the following chemotherapy agents solaris  To help prevent nausea and vomiting after your treatment, we encourage you to take your nausea medication as directed   If you develop nausea and vomiting that is not controlled by your nausea medication, call the clinic.   BELOW ARE SYMPTOMS THAT SHOULD BE REPORTED IMMEDIATELY:  *FEVER GREATER THAN 100.5 F  *CHILLS WITH OR WITHOUT FEVER  NAUSEA AND VOMITING THAT IS NOT CONTROLLED WITH YOUR NAUSEA MEDICATION  *UNUSUAL SHORTNESS OF BREATH  *UNUSUAL BRUISING OR BLEEDING  TENDERNESS IN MOUTH AND THROAT WITH OR WITHOUT PRESENCE OF ULCERS  *URINARY PROBLEMS  *BOWEL PROBLEMS  UNUSUAL RASH Items with * indicate a potential emergency and should be followed up as soon as possible.  Feel free to call the clinic you have any questions or concerns. The clinic phone number is [phone].  Please show the [facility] at check-in to the Emergency Department and triage nurse.

## 2017-03-10 NOTE — Progress Notes (Signed)
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  SUMMARY OF ONCOLOGIC HISTORY:  No history exists.   PRIOR THERAPY: The patient was previously treated with a tapering dose of prednisone and high-dose IVIG for the hemolytic anemia before his diagnosis of paroxysmal nocturnal hemoglobinuria.  CURRENT THERAPY: Soliris (Eculizumab) initially with induction 600 MG IV weekly for 4 weeks and currently on maintenance treatment 900 MG IV every 2 weeks. First dose of treatment was given on 11/21/2014. Status post 30 cycles.  INTERVAL HISTORY: Travis Mcdaniel 46 y.o. male returns for a follow-up visit. The patient is feeling fine today with no specific complaints. He stopped taking Tylenol for his ankle pain because he was worried about his elevated bilirubin. He has an old injury to this area. He denied having any chest pain, shortness breath, cough or hemoptysis. He denied having any fever or chills. He has no nausea, vomiting, diarrhea or constipation. He has no chest pain, shortness of breath, cough or hemoptysis. He has been tolerating his treatment with Soliris fairly well. He is here today for evaluation before starting cycle #31.   Patient Active Problem List   Diagnosis Date Noted  . Sinusitis 09/09/2016  . Encounter for antineoplastic chemotherapy 01/15/2016  . Paroxysmal nocturnal hemoglobinuria (PNH) ([place]) 11/12/2014  . Abscess 10/24/2014  . Hypertension 10/24/2014  . Hyperbilirubinemia 10/24/2014  . Anxiety 10/18/2014  . Thrombocytopenia ([facility]) 10/12/2014  . Influenza A (H1N1) 09/06/2014  . Acute kidney injury ([facility]) 09/05/2014  . UTI (lower urinary tract infection) 09/05/2014  . Alcohol dependence ([facility]) 09/05/2014  . Diarrhea 09/05/2014  . Absolute anemia     has No Known Allergies.  MEDICAL HISTORY: Past Medical History:   Diagnosis Date  . Diverticulosis   . Encounter for antineoplastic chemotherapy 01/15/2016  . Hypertension     SURGICAL HISTORY: History reviewed. No pertinent surgical history.  SOCIAL HISTORY: Social History   Social History  . Marital status: Single    Spouse name: N/A  . Number of children: N/A  . Years of education: N/A   Occupational History  . Not on file.   Social History Main Topics  . Smoking status: Never Smoker  . Smokeless tobacco: Never Used  . Alcohol use Yes     Comment: [employer]/ [employer] weekend  . Drug use: No  . Sexual activity: Not on file   Other Topics Concern  . Not on file   Social History Narrative  . No narrative on file    FAMILY HISTORY: Family History  Problem Relation Age of Onset  . Alcohol abuse Brother   . Cirrhosis Brother        deceased    Review of Systems  Constitutional: Negative.   HENT:  Negative.   Eyes: Negative.   Respiratory: Negative.   Cardiovascular: Negative.   Gastrointestinal: Negative.   Genitourinary: Negative.    Musculoskeletal:       Left ankle pain. Intermittent back pain.  Skin: Negative.   Neurological: Negative.   Hematological: Negative.   Psychiatric/Behavioral: Negative.       PHYSICAL EXAMINATION  ECOG PERFORMANCE STATUS: 1 - Symptomatic but completely ambulatory  Vitals:   03/10/17 1157  BP: (!) 115/58  Pulse: 65  Resp: 18  Temp: 98.1 F (36.7 C)  SpO2: 100%    Physical Exam  Constitutional: He is oriented to person,  place, and time and well-developed, well-nourished, and in no distress. No distress.  HENT:  Head: Normocephalic.  Mouth/Throat: Oropharynx is clear and moist. No oropharyngeal exudate.  Eyes: Conjunctivae are normal. No scleral icterus.  Neck: Normal range of motion. Neck supple.  Cardiovascular: Normal rate, regular rhythm, normal heart sounds and intact distal pulses.   Pulmonary/Chest: Effort normal. No respiratory distress. He has no wheezes. He has no  rales.  Abdominal: Soft. Bowel sounds are normal. He exhibits no distension and no mass. There is no tenderness.  Musculoskeletal: Normal range of motion. He exhibits no edema.  Lymphadenopathy:    He has no cervical adenopathy.  Neurological: He is alert and oriented to person, place, and time. He exhibits normal muscle tone.  Skin: Skin is warm and dry. No rash noted. He is not diaphoretic. No erythema. No pallor.  Psychiatric: Mood, memory, affect and judgment normal.  Vitals reviewed.   LABORATORY DATA:  CBC    Component Value Date/Time   WBC 4.5 03/10/2017 1121   WBC 6.2 11/30/2014 0725   RBC 3.24 (L) 03/10/2017 1121   RBC 4.47 11/30/2014 0725   HGB 12.3 (L) 03/10/2017 1121   HCT 36.8 (L) 03/10/2017 1121   PLT 134 (L) 03/10/2017 1121   MCV 113.6 (H) 03/10/2017 1121   MCH 38.0 (H) 03/10/2017 1121   MCH 30.2 11/30/2014 0725   MCHC 33.4 03/10/2017 1121   MCHC 31.7 11/30/2014 0725   RDW 15.0 (H) 03/10/2017 1121   LYMPHSABS 1.2 03/10/2017 1121   MONOABS 0.4 03/10/2017 1121   EOSABS 0.2 03/10/2017 1121   BASOSABS 0.0 03/10/2017 1121    CMP     Component Value Date/Time   NA 140 03/10/2017 1121   K 4.1 03/10/2017 1121   CL 110 09/07/2014 0407   CO2 24 03/10/2017 1121   GLUCOSE 96 03/10/2017 1121   BUN 14.0 03/10/2017 1121   CREATININE 1.0 03/10/2017 1121   CALCIUM 9.4 03/10/2017 1121   PROT 6.9 03/10/2017 1121   ALBUMIN 4.4 03/10/2017 1121   AST 31 03/10/2017 1121   ALT 41 03/10/2017 1121   ALKPHOS 58 03/10/2017 1121   BILITOT 2.63 (H) 03/10/2017 1121   GFRNONAA >90 09/07/2014 0407   GFRAA >90 09/07/2014 0407    RADIOGRAPHIC STUDIES:  No results found.  ASSESSMENT and THERAPY PLAN:   Paroxysmal nocturnal hemoglobinuria (PNH) ([facility]) This is a very pleasant 46 years old white male with paroxysmal nocturnal hemoglobinuria and currently on treatment with Soliris on days 1 and 15 every 4 weeks status post 30 cycles. He tolerated the last cycle of his treatment  well. He continues to have hyperbilirubinemia likely secondary to his disease. I recommended for the patient to continue his treatment with Soliris with the same dose. I will see him back for follow-up visit in 4 weeks for evaluation before starting cycle #32. He was advised to call immediately if he has any concerning symptoms in the interval. The patient voices understanding of current disease status and treatment options and is in agreement with the current care plan. All questions were answered. The patient knows to call the clinic with any problems, questions or concerns. We can certainly see the patient much sooner if necessary.   No orders of the defined types were placed in this encounter.   All questions were answered. The patient knows to call the clinic with any problems, questions or concerns. We can certainly see the patient much sooner if necessary.  Mikey Bussing, NP 03/12/2017

## 2017-03-12 NOTE — Assessment & Plan Note (Signed)
This is a very pleasant 46 years old white male with paroxysmal nocturnal hemoglobinuria and currently on treatment with Soliris on days 1 and 15 every 4 weeks status post 30 cycles. He tolerated the last cycle of his treatment well. He continues to have hyperbilirubinemia likely secondary to his disease. I recommended for the patient to continue his treatment with Soliris with the same dose. I will see him back for follow-up visit in 4 weeks for evaluation before starting cycle #32. He was advised to call immediately if he has any concerning symptoms in the interval. The patient voices understanding of current disease status and treatment options and is in agreement with the current care plan. All questions were answered. The patient knows to call the clinic with any problems, questions or concerns. We can certainly see the patient much sooner if necessary.

## 2017-03-24 ENCOUNTER — Ambulatory Visit (HOSPITAL_BASED_OUTPATIENT_CLINIC_OR_DEPARTMENT_OTHER): Payer: BLUE CROSS/BLUE SHIELD

## 2017-03-24 ENCOUNTER — Other Ambulatory Visit (HOSPITAL_BASED_OUTPATIENT_CLINIC_OR_DEPARTMENT_OTHER): Payer: BLUE CROSS/BLUE SHIELD

## 2017-03-24 VITALS — BP 121/75 | HR 62 | Temp 97.7°F | Resp 18

## 2017-03-24 DIAGNOSIS — Z5112 Encounter for antineoplastic immunotherapy: Secondary | ICD-10-CM

## 2017-03-24 DIAGNOSIS — D595 Paroxysmal nocturnal hemoglobinuria [Marchiafava-Micheli]: Secondary | ICD-10-CM | POA: Diagnosis not present

## 2017-03-24 LAB — CBC WITH DIFFERENTIAL/PLATELET
BASO%: 1.2 % (ref 0.0–2.0)
Basophils Absolute: 0 10*3/uL (ref 0.0–0.1)
EOS ABS: 0.2 10*3/uL (ref 0.0–0.5)
EOS%: 5.4 % (ref 0.0–7.0)
HEMATOCRIT: 37 % — AB (ref 38.4–49.9)
HGB: 12.6 g/dL — ABNORMAL LOW (ref 13.0–17.1)
LYMPH#: 1.1 10*3/uL (ref 0.9–3.3)
LYMPH%: 26.8 % (ref 14.0–49.0)
MCH: 38.4 pg — ABNORMAL HIGH (ref 27.2–33.4)
MCHC: 34 g/dL (ref 32.0–36.0)
MCV: 112.9 fL — AB (ref 79.3–98.0)
MONO#: 0.4 10*3/uL (ref 0.1–0.9)
MONO%: 10.2 % (ref 0.0–14.0)
NEUT%: 56.4 % (ref 39.0–75.0)
NEUTROS ABS: 2.4 10*3/uL (ref 1.5–6.5)
PLATELETS: 138 10*3/uL — AB (ref 140–400)
RBC: 3.27 10*6/uL — AB (ref 4.20–5.82)
RDW: 14.5 % (ref 11.0–14.6)
WBC: 4.2 10*3/uL (ref 4.0–10.3)

## 2017-03-24 LAB — COMPREHENSIVE METABOLIC PANEL
ALBUMIN: 4.4 g/dL (ref 3.5–5.0)
ALT: 60 U/L — AB (ref 0–55)
ANION GAP: 9 meq/L (ref 3–11)
AST: 37 U/L — ABNORMAL HIGH (ref 5–34)
Alkaline Phosphatase: 57 U/L (ref 40–150)
BUN: 16.2 mg/dL (ref 7.0–26.0)
CALCIUM: 9.5 mg/dL (ref 8.4–10.4)
CHLORIDE: 106 meq/L (ref 98–109)
CO2: 24 meq/L (ref 22–29)
Creatinine: 0.9 mg/dL (ref 0.7–1.3)
GLUCOSE: 89 mg/dL (ref 70–140)
POTASSIUM: 4.3 meq/L (ref 3.5–5.1)
Sodium: 140 mEq/L (ref 136–145)
TOTAL PROTEIN: 7.3 g/dL (ref 6.4–8.3)
Total Bilirubin: 2.81 mg/dL — ABNORMAL HIGH (ref 0.20–1.20)

## 2017-03-24 LAB — LACTATE DEHYDROGENASE: LDH: 216 U/L (ref 125–245)

## 2017-03-24 MED ORDER — SODIUM CHLORIDE 0.9 % IV SOLN
900.0000 mg | Freq: Once | INTRAVENOUS | Status: AC
  Administered 2017-03-24: 900 mg via INTRAVENOUS
  Filled 2017-03-24: qty 90

## 2017-03-24 MED ORDER — SODIUM CHLORIDE 0.9 % IV SOLN
Freq: Once | INTRAVENOUS | Status: AC
  Administered 2017-03-24: 14:00:00 via INTRAVENOUS

## 2017-04-07 ENCOUNTER — Encounter: Payer: Self-pay | Admitting: Oncology

## 2017-04-07 ENCOUNTER — Telehealth: Payer: Self-pay | Admitting: Oncology

## 2017-04-07 ENCOUNTER — Other Ambulatory Visit (HOSPITAL_BASED_OUTPATIENT_CLINIC_OR_DEPARTMENT_OTHER): Payer: BLUE CROSS/BLUE SHIELD

## 2017-04-07 ENCOUNTER — Ambulatory Visit (HOSPITAL_BASED_OUTPATIENT_CLINIC_OR_DEPARTMENT_OTHER): Payer: BLUE CROSS/BLUE SHIELD

## 2017-04-07 ENCOUNTER — Ambulatory Visit (HOSPITAL_BASED_OUTPATIENT_CLINIC_OR_DEPARTMENT_OTHER): Payer: BLUE CROSS/BLUE SHIELD | Admitting: Oncology

## 2017-04-07 VITALS — BP 121/66 | HR 70 | Temp 97.8°F | Resp 18 | Ht 66.0 in | Wt 169.9 lb

## 2017-04-07 DIAGNOSIS — D595 Paroxysmal nocturnal hemoglobinuria [Marchiafava-Micheli]: Secondary | ICD-10-CM | POA: Diagnosis not present

## 2017-04-07 DIAGNOSIS — Z5112 Encounter for antineoplastic immunotherapy: Secondary | ICD-10-CM | POA: Diagnosis not present

## 2017-04-07 DIAGNOSIS — Z5111 Encounter for antineoplastic chemotherapy: Secondary | ICD-10-CM

## 2017-04-07 LAB — COMPREHENSIVE METABOLIC PANEL
ALT: 55 U/L (ref 0–55)
ANION GAP: 11 meq/L (ref 3–11)
AST: 39 U/L — AB (ref 5–34)
Albumin: 4.5 g/dL (ref 3.5–5.0)
Alkaline Phosphatase: 60 U/L (ref 40–150)
BILIRUBIN TOTAL: 2.94 mg/dL — AB (ref 0.20–1.20)
BUN: 16.9 mg/dL (ref 7.0–26.0)
CO2: 23 meq/L (ref 22–29)
CREATININE: 0.9 mg/dL (ref 0.7–1.3)
Calcium: 9.3 mg/dL (ref 8.4–10.4)
Chloride: 105 mEq/L (ref 98–109)
EGFR: >60 mL/min/{1.73_m2} (ref >60)
Glucose: 87 mg/dl (ref 70–140)
Potassium: 4.3 mEq/L (ref 3.5–5.1)
Sodium: 139 mEq/L (ref 136–145)
TOTAL PROTEIN: 7.4 g/dL (ref 6.4–8.3)

## 2017-04-07 LAB — CBC WITH DIFFERENTIAL/PLATELET
BASO%: 0.7 % (ref 0.0–2.0)
Basophils Absolute: 0 10*3/uL (ref 0.0–0.1)
EOS%: 4.3 % (ref 0.0–7.0)
Eosinophils Absolute: 0.2 10*3/uL (ref 0.0–0.5)
HEMATOCRIT: 37.3 % — AB (ref 38.4–49.9)
HGB: 12.4 g/dL — ABNORMAL LOW (ref 13.0–17.1)
LYMPH#: 1 10*3/uL (ref 0.9–3.3)
LYMPH%: 24.1 % (ref 14.0–49.0)
MCH: 37.6 pg — ABNORMAL HIGH (ref 27.2–33.4)
MCHC: 33.2 g/dL (ref 32.0–36.0)
MCV: 113.2 fL — ABNORMAL HIGH (ref 79.3–98.0)
MONO#: 0.4 10*3/uL (ref 0.1–0.9)
MONO%: 9.4 % (ref 0.0–14.0)
NEUT%: 61.5 % (ref 39.0–75.0)
NEUTROS ABS: 2.6 10*3/uL (ref 1.5–6.5)
PLATELETS: 144 10*3/uL (ref 140–400)
RBC: 3.3 10*6/uL — AB (ref 4.20–5.82)
RDW: 14.3 % (ref 11.0–14.6)
WBC: 4.2 10*3/uL (ref 4.0–10.3)

## 2017-04-07 LAB — LACTATE DEHYDROGENASE: LDH: 230 U/L (ref 125–245)

## 2017-04-07 MED ORDER — TRAMADOL HCL 50 MG PO TABS: 50.0000 mg | ORAL_TABLET | Freq: Two times a day (BID) | ORAL | 0 refills | Status: DC | PRN

## 2017-04-07 MED ORDER — SODIUM CHLORIDE 0.9 % IV SOLN
900.0000 mg | Freq: Once | INTRAVENOUS | Status: AC
  Administered 2017-04-07: 900 mg via INTRAVENOUS
  Filled 2017-04-07: qty 90

## 2017-04-07 MED ORDER — SODIUM CHLORIDE 0.9 % IV SOLN
Freq: Once | INTRAVENOUS | Status: AC
  Administered 2017-04-07: 15:00:00 via INTRAVENOUS

## 2017-04-07 NOTE — Progress Notes (Signed)
Patient declined to stay for post-observation. Stable upon discharge.   Wylene Simmer, BSN, RN 04/07/2017 4:18 PM

## 2017-04-07 NOTE — Patient Instructions (Signed)
[facility] Discharge Instructions for Patients Receiving Chemotherapy  Today you received the following chemotherapy agents solaris  To help prevent nausea and vomiting after your treatment, we encourage you to take your nausea medication as directed   If you develop nausea and vomiting that is not controlled by your nausea medication, call the clinic.   BELOW ARE SYMPTOMS THAT SHOULD BE REPORTED IMMEDIATELY:  *FEVER GREATER THAN 100.5 F  *CHILLS WITH OR WITHOUT FEVER  NAUSEA AND VOMITING THAT IS NOT CONTROLLED WITH YOUR NAUSEA MEDICATION  *UNUSUAL SHORTNESS OF BREATH  *UNUSUAL BRUISING OR BLEEDING  TENDERNESS IN MOUTH AND THROAT WITH OR WITHOUT PRESENCE OF ULCERS  *URINARY PROBLEMS  *BOWEL PROBLEMS  UNUSUAL RASH Items with * indicate a potential emergency and should be followed up as soon as possible.  Feel free to call the clinic you have any questions or concerns. The clinic phone number is [phone].  Please show the [facility] at check-in to the Emergency Department and triage nurse.

## 2017-04-07 NOTE — Telephone Encounter (Signed)
Gave avs and calendar for October - December  °

## 2017-04-07 NOTE — Progress Notes (Signed)
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  SUMMARY OF ONCOLOGIC HISTORY:  No history exists.   PRIOR THERAPY: The patient was previously treated with a tapering dose of prednisone and high-dose IVIG for the hemolytic anemia before his diagnosis of paroxysmal nocturnal hemoglobinuria.  CURRENT THERAPY: Soliris (Eculizumab) initially with induction 600 MG IV weekly for 4 weeks and currently on maintenance treatment 900 MG IV every 2 weeks. First dose of treatment was given on 11/21/2014. Status post 31cycles.  INTERVAL HISTORY: Travis Mcdaniel 46 y.o. male returns for a follow-up visit. The patient is feeling fine today with no specific complaints. He stopped taking Tylenol for his ankle pain because he was worried about his elevated bilirubin. He has ongoing right ankle pain. He has an old injury to this area. He denied having any chest pain, shortness breath, cough or hemoptysis. He denied having any fever or chills. He has no nausea, vomiting, diarrhea or constipation. He has no chest pain, shortness of breath, cough or hemoptysis. He has been tolerating his treatment with Solirisfairly well. He is here today for evaluation before starting cycle #32.   Patient Active Problem List   Diagnosis Date Noted  . Sinusitis 09/09/2016  . Encounter for antineoplastic chemotherapy 01/15/2016  . Paroxysmal nocturnal hemoglobinuria (PNH) ([place]) 11/12/2014  . Abscess 10/24/2014  . Hypertension 10/24/2014  . Hyperbilirubinemia 10/24/2014  . Anxiety 10/18/2014  . Thrombocytopenia ([place]) 10/12/2014  . Influenza A (H1N1) 09/06/2014  . Acute kidney injury ([place]) 09/05/2014  . UTI (lower urinary tract infection) 09/05/2014  . Alcohol dependence ([place]) 09/05/2014  . Diarrhea 09/05/2014  . Absolute anemia     has No Known Allergies.  MEDICAL  HISTORY: Past Medical History:  Diagnosis Date  . Diverticulosis   . Encounter for antineoplastic chemotherapy 01/15/2016  . Hypertension     SURGICAL HISTORY: History reviewed. No pertinent surgical history.  SOCIAL HISTORY: Social History   Social History  . Marital status: Single    Spouse name: N/A  . Number of children: N/A  . Years of education: N/A   Occupational History  . Not on file.   Social History Main Topics  . Smoking status: Never Smoker  . Smokeless tobacco: Never Used  . Alcohol use Yes     Comment: [employer]s/ budlight weekend  . Drug use: No  . Sexual activity: Not on file   Other Topics Concern  . Not on file   Social History Narrative  . No narrative on file    FAMILY HISTORY: Family History  Problem Relation Age of Onset  . Alcohol abuse Brother   . Cirrhosis Brother        deceased    Review of Systems  Constitutional: Negative.   HENT:  Negative.   Eyes: Negative.   Respiratory: Negative.   Cardiovascular: Negative.   Gastrointestinal: Negative.   Genitourinary: Negative.    Musculoskeletal:       Right ankle pain.  Skin: Negative.   Neurological: Negative.   Hematological: Negative.   Psychiatric/Behavioral: Negative.       PHYSICAL EXAMINATION  ECOG PERFORMANCE STATUS: 1 - Symptomatic but completely ambulatory  Vitals:   04/07/17 1308  BP: 121/66  Pulse: 70  Resp: 18  Temp: 97.8 F (36.6 C)  SpO2: 100%    Physical Exam  Constitutional: He is oriented to person,  place, and time and well-developed, well-nourished, and in no distress. No distress.  HENT:  Head: Normocephalic.  Mouth/Throat: Oropharynx is clear and moist. No oropharyngeal exudate.  Eyes: Conjunctivae are normal. Right eye exhibits no discharge. Left eye exhibits no discharge. No scleral icterus.  Neck: Normal range of motion. Neck supple.  Cardiovascular: Normal rate, regular rhythm, normal heart sounds and intact distal pulses.   Pulmonary/Chest:  Effort normal and breath sounds normal. No respiratory distress. He has no wheezes. He has no rales.  Abdominal: Soft. Bowel sounds are normal. He exhibits no distension and no mass. There is no tenderness.  Musculoskeletal: Normal range of motion. He exhibits no edema.  Lymphadenopathy:    He has no cervical adenopathy.  Neurological: He is alert and oriented to person, place, and time. He exhibits normal muscle tone. Gait normal. Coordination normal.  Skin: Skin is warm and dry. No rash noted. He is not diaphoretic. No erythema. No pallor.  Psychiatric: Mood, memory, affect and judgment normal.  Vitals reviewed.   LABORATORY DATA:  CBC    Component Value Date/Time   WBC 4.2 04/07/2017 1252   WBC 6.2 11/30/2014 0725   RBC 3.30 (L) 04/07/2017 1252   RBC 4.47 11/30/2014 0725   HGB 12.4 (L) 04/07/2017 1252   HCT 37.3 (L) 04/07/2017 1252   PLT 144 04/07/2017 1252   MCV 113.2 (H) 04/07/2017 1252   MCH 37.6 (H) 04/07/2017 1252   MCH 30.2 11/30/2014 0725   MCHC 33.2 04/07/2017 1252   MCHC 31.7 11/30/2014 0725   RDW 14.3 04/07/2017 1252   LYMPHSABS 1.0 04/07/2017 1252   MONOABS 0.4 04/07/2017 1252   EOSABS 0.2 04/07/2017 1252   BASOSABS 0.0 04/07/2017 1252    CMP     Component Value Date/Time   NA 139 04/07/2017 1252   K 4.3 04/07/2017 1252   CL 110 09/07/2014 0407   CO2 23 04/07/2017 1252   GLUCOSE 87 04/07/2017 1252   BUN 16.9 04/07/2017 1252   CREATININE 0.9 04/07/2017 1252   CALCIUM 9.3 04/07/2017 1252   PROT 7.4 04/07/2017 1252   ALBUMIN 4.5 04/07/2017 1252   AST 39 (H) 04/07/2017 1252   ALT 55 04/07/2017 1252   ALKPHOS 60 04/07/2017 1252   BILITOT 2.94 (H) 04/07/2017 1252   GFRNONAA >90 09/07/2014 0407   GFRAA >90 09/07/2014 0407    RADIOGRAPHIC STUDIES:  No results found.   ASSESSMENT and THERAPY PLAN:   Paroxysmal nocturnal hemoglobinuria (PNH) ([facility]) This is a very pleasant 46 years old white male with paroxysmal nocturnal hemoglobinuria and currently  on treatment with Soliris on days 1 and 15 every 4 weeks status post 31cycles. He tolerated the last cycle of his treatment well.He continues to have hyperbilirubinemia likely secondary to his disease. I recommended for the patient to continue his treatment with Soliriswith the same dose. I will see him back for follow-up visit in 4 weeks for evaluation before starting cycle #33. He was advised to call immediately if he has any concerning symptoms in the interval. The patient voices understanding of current disease status and treatment options and is in agreement with the current care plan. All questions were answered. The patient knows to call the clinic with any problems, questions or concerns. We can certainly see the patient much sooner if necessary.   No orders of the defined types were placed in this encounter.   All questions were answered. The patient knows to call the clinic with any problems, questions or concerns.  We can certainly see the patient much sooner if necessary.  Mikey Bussing, NP 04/09/2017

## 2017-04-09 NOTE — Assessment & Plan Note (Signed)
This is a very pleasant 46 years old white male with paroxysmal nocturnal hemoglobinuria and currently on treatment with Soliris on days 1 and 15 every 4 weeks status post 31cycles. He tolerated the last cycle of his treatment well.He continues to have hyperbilirubinemia likely secondary to his disease. I recommended for the patient to continue his treatment with Soliriswith the same dose. I will see him back for follow-up visit in 4 weeks for evaluation before starting cycle #33. He was advised to call immediately if he has any concerning symptoms in the interval. The patient voices understanding of current disease status and treatment options and is in agreement with the current care plan. All questions were answered. The patient knows to call the clinic with any problems, questions or concerns. We can certainly see the patient much sooner if necessary.

## 2017-04-21 ENCOUNTER — Ambulatory Visit (HOSPITAL_BASED_OUTPATIENT_CLINIC_OR_DEPARTMENT_OTHER): Payer: BLUE CROSS/BLUE SHIELD

## 2017-04-21 ENCOUNTER — Other Ambulatory Visit (HOSPITAL_BASED_OUTPATIENT_CLINIC_OR_DEPARTMENT_OTHER): Payer: BLUE CROSS/BLUE SHIELD

## 2017-04-21 VITALS — BP 136/77 | HR 82 | Temp 98.2°F | Resp 17

## 2017-04-21 DIAGNOSIS — D595 Paroxysmal nocturnal hemoglobinuria [Marchiafava-Micheli]: Secondary | ICD-10-CM

## 2017-04-21 DIAGNOSIS — Z5112 Encounter for antineoplastic immunotherapy: Secondary | ICD-10-CM

## 2017-04-21 LAB — CBC WITH DIFFERENTIAL/PLATELET
BASO%: 0.7 % (ref 0.0–2.0)
Basophils Absolute: 0 10*3/uL (ref 0.0–0.1)
EOS ABS: 0.3 10*3/uL (ref 0.0–0.5)
EOS%: 6.5 % (ref 0.0–7.0)
HCT: 37.5 % — ABNORMAL LOW (ref 38.4–49.9)
HGB: 12.2 g/dL — ABNORMAL LOW (ref 13.0–17.1)
LYMPH%: 25.5 % (ref 14.0–49.0)
MCH: 36.6 pg — AB (ref 27.2–33.4)
MCHC: 32.5 g/dL (ref 32.0–36.0)
MCV: 112.6 fL — AB (ref 79.3–98.0)
MONO#: 0.3 10*3/uL (ref 0.1–0.9)
MONO%: 7.5 % (ref 0.0–14.0)
NEUT#: 2.6 10*3/uL (ref 1.5–6.5)
NEUT%: 59.8 % (ref 39.0–75.0)
Platelets: 135 10*3/uL — ABNORMAL LOW (ref 140–400)
RBC: 3.33 10*6/uL — AB (ref 4.20–5.82)
RDW: 14.4 % (ref 11.0–14.6)
WBC: 4.3 10*3/uL (ref 4.0–10.3)
lymph#: 1.1 10*3/uL (ref 0.9–3.3)

## 2017-04-21 LAB — COMPREHENSIVE METABOLIC PANEL
ALT: 43 U/L (ref 0–55)
AST: 32 U/L (ref 5–34)
Albumin: 4.6 g/dL (ref 3.5–5.0)
Alkaline Phosphatase: 55 U/L (ref 40–150)
Anion Gap: 12 mEq/L — ABNORMAL HIGH (ref 3–11)
BUN: 15.3 mg/dL (ref 7.0–26.0)
CO2: 22 meq/L (ref 22–29)
Calcium: 9.2 mg/dL (ref 8.4–10.4)
Chloride: 104 mEq/L (ref 98–109)
Creatinine: 0.9 mg/dL (ref 0.7–1.3)
GLUCOSE: 103 mg/dL (ref 70–140)
POTASSIUM: 3.9 meq/L (ref 3.5–5.1)
SODIUM: 138 meq/L (ref 136–145)
TOTAL PROTEIN: 7.4 g/dL (ref 6.4–8.3)
Total Bilirubin: 2.19 mg/dL — ABNORMAL HIGH (ref 0.20–1.20)

## 2017-04-21 LAB — LACTATE DEHYDROGENASE: LDH: 228 U/L (ref 125–245)

## 2017-04-21 MED ORDER — SODIUM CHLORIDE 0.9 % IV SOLN
900.0000 mg | Freq: Once | INTRAVENOUS | Status: AC
  Administered 2017-04-21: 900 mg via INTRAVENOUS
  Filled 2017-04-21: qty 90

## 2017-04-21 MED ORDER — SODIUM CHLORIDE 0.9 % IV SOLN
Freq: Once | INTRAVENOUS | Status: AC
  Administered 2017-04-21: 14:00:00 via INTRAVENOUS

## 2017-04-21 NOTE — Patient Instructions (Signed)
[facility] Discharge Instructions for Patients Receiving Chemotherapy  Today you received the following chemotherapy agents Soliris To help prevent nausea and vomiting after your treatment, we encourage you to take your nausea medication as prescribed.   If you develop nausea and vomiting that is not controlled by your nausea medication, call the clinic.   BELOW ARE SYMPTOMS THAT SHOULD BE REPORTED IMMEDIATELY:  *FEVER GREATER THAN 100.5 F  *CHILLS WITH OR WITHOUT FEVER  NAUSEA AND VOMITING THAT IS NOT CONTROLLED WITH YOUR NAUSEA MEDICATION  *UNUSUAL SHORTNESS OF BREATH  *UNUSUAL BRUISING OR BLEEDING  TENDERNESS IN MOUTH AND THROAT WITH OR WITHOUT PRESENCE OF ULCERS  *URINARY PROBLEMS  *BOWEL PROBLEMS  UNUSUAL RASH Items with * indicate a potential emergency and should be followed up as soon as possible.  Feel free to call the clinic should you have any questions or concerns. The clinic phone number is [phone].  Please show the CHEMO ALERT CARD at check-in to the Emergency Department and triage nurse.   

## 2017-05-05 ENCOUNTER — Other Ambulatory Visit (HOSPITAL_BASED_OUTPATIENT_CLINIC_OR_DEPARTMENT_OTHER): Payer: BLUE CROSS/BLUE SHIELD

## 2017-05-05 ENCOUNTER — Ambulatory Visit (HOSPITAL_BASED_OUTPATIENT_CLINIC_OR_DEPARTMENT_OTHER): Payer: BLUE CROSS/BLUE SHIELD

## 2017-05-05 ENCOUNTER — Ambulatory Visit: Payer: BLUE CROSS/BLUE SHIELD | Admitting: Oncology

## 2017-05-05 ENCOUNTER — Encounter: Payer: Self-pay | Admitting: Oncology

## 2017-05-05 VITALS — BP 120/64 | HR 67 | Temp 97.8°F | Resp 18 | Ht 66.0 in | Wt 173.0 lb

## 2017-05-05 DIAGNOSIS — D595 Paroxysmal nocturnal hemoglobinuria [Marchiafava-Micheli]: Secondary | ICD-10-CM | POA: Diagnosis not present

## 2017-05-05 DIAGNOSIS — Z5112 Encounter for antineoplastic immunotherapy: Secondary | ICD-10-CM

## 2017-05-05 DIAGNOSIS — Z5111 Encounter for antineoplastic chemotherapy: Secondary | ICD-10-CM

## 2017-05-05 DIAGNOSIS — D696 Thrombocytopenia, unspecified: Secondary | ICD-10-CM

## 2017-05-05 LAB — LACTATE DEHYDROGENASE: LDH: 212 U/L (ref 125–245)

## 2017-05-05 LAB — COMPREHENSIVE METABOLIC PANEL
ALT: 51 U/L (ref 0–55)
ANION GAP: 10 meq/L (ref 3–11)
AST: 33 U/L (ref 5–34)
Albumin: 4.4 g/dL (ref 3.5–5.0)
Alkaline Phosphatase: 54 U/L (ref 40–150)
BILIRUBIN TOTAL: 2.12 mg/dL — AB (ref 0.20–1.20)
BUN: 13.5 mg/dL (ref 7.0–26.0)
CALCIUM: 9.6 mg/dL (ref 8.4–10.4)
CHLORIDE: 104 meq/L (ref 98–109)
CO2: 23 mEq/L (ref 22–29)
CREATININE: 0.8 mg/dL (ref 0.7–1.3)
Glucose: 98 mg/dl (ref 70–140)
Potassium: 4 mEq/L (ref 3.5–5.1)
Sodium: 136 mEq/L (ref 136–145)
Total Protein: 7.3 g/dL (ref 6.4–8.3)

## 2017-05-05 LAB — CBC WITH DIFFERENTIAL/PLATELET
BASO%: 1.1 % (ref 0.0–2.0)
BASOS ABS: 0 10*3/uL (ref 0.0–0.1)
EOS ABS: 0.2 10*3/uL (ref 0.0–0.5)
EOS%: 5 % (ref 0.0–7.0)
HEMATOCRIT: 38.7 % (ref 38.4–49.9)
HGB: 13 g/dL (ref 13.0–17.1)
LYMPH#: 0.9 10*3/uL (ref 0.9–3.3)
LYMPH%: 22.2 % (ref 14.0–49.0)
MCH: 37.8 pg — ABNORMAL HIGH (ref 27.2–33.4)
MCHC: 33.6 g/dL (ref 32.0–36.0)
MCV: 112.5 fL — AB (ref 79.3–98.0)
MONO#: 0.4 10*3/uL (ref 0.1–0.9)
MONO%: 9.5 % (ref 0.0–14.0)
NEUT#: 2.6 10*3/uL (ref 1.5–6.5)
NEUT%: 62.2 % (ref 39.0–75.0)
PLATELETS: 131 10*3/uL — AB (ref 140–400)
RBC: 3.44 10*6/uL — ABNORMAL LOW (ref 4.20–5.82)
RDW: 14.9 % — ABNORMAL HIGH (ref 11.0–14.6)
WBC: 4.1 10*3/uL (ref 4.0–10.3)

## 2017-05-05 MED ORDER — SODIUM CHLORIDE 0.9 % IV SOLN
Freq: Once | INTRAVENOUS | Status: AC
  Administered 2017-05-05: 11:00:00 via INTRAVENOUS

## 2017-05-05 MED ORDER — TRAMADOL HCL 50 MG PO TABS: 50.0000 mg | ORAL_TABLET | Freq: Two times a day (BID) | ORAL | 0 refills | Status: DC | PRN

## 2017-05-05 MED ORDER — SODIUM CHLORIDE 0.9 % IV SOLN
900.0000 mg | Freq: Once | INTRAVENOUS | Status: AC
  Administered 2017-05-05: 900 mg via INTRAVENOUS
  Filled 2017-05-05: qty 90

## 2017-05-05 NOTE — Patient Instructions (Signed)
[facility] Discharge Instructions for Patients Receiving Chemotherapy  Today you received the following chemotherapy agents Soliris To help prevent nausea and vomiting after your treatment, we encourage you to take your nausea medication as prescribed.   If you develop nausea and vomiting that is not controlled by your nausea medication, call the clinic.   BELOW ARE SYMPTOMS THAT SHOULD BE REPORTED IMMEDIATELY:  *FEVER GREATER THAN 100.5 F  *CHILLS WITH OR WITHOUT FEVER  NAUSEA AND VOMITING THAT IS NOT CONTROLLED WITH YOUR NAUSEA MEDICATION  *UNUSUAL SHORTNESS OF BREATH  *UNUSUAL BRUISING OR BLEEDING  TENDERNESS IN MOUTH AND THROAT WITH OR WITHOUT PRESENCE OF ULCERS  *URINARY PROBLEMS  *BOWEL PROBLEMS  UNUSUAL RASH Items with * indicate a potential emergency and should be followed up as soon as possible.  Feel free to call the clinic should you have any questions or concerns. The clinic phone number is [phone].  Please show the CHEMO ALERT CARD at check-in to the Emergency Department and triage nurse.   

## 2017-05-05 NOTE — Progress Notes (Signed)
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  PRIOR THERAPY: The patient was previously treated with a tapering dose of prednisone and high-dose IVIG for the hemolytic anemia before his diagnosis of paroxysmal nocturnal hemoglobinuria.  CURRENT THERAPY: Soliris (Eculizumab) initially with induction 600 MG IV weekly for 4 weeks and currently on maintenance treatment 900 MG IV every 2 weeks. First dose of treatment was given on 11/21/2014. Status post 33cycles.  INTERVAL HISTORY: Travis Mcdaniel 46 y.o. male returns for routine follow-up visit by himself. The patient is feeling fine today with no specific complaints. He stopped taking Tylenol for his ankle pain because he was worried about his elevated bilirubin. He has ongoing right ankle pain. He has an old injury to this area. He denied having any chest pain, shortness breath, cough or hemoptysis. He denied having any fever or chills. He has no nausea, vomiting, diarrhea or constipation. He has no chest pain, shortness of breath, cough or hemoptysis. He has been tolerating his treatment with Solirisfairly well. He is here today for evaluation before starting cycle #34.  MEDICAL HISTORY: Past Medical History:  Diagnosis Date  . Diverticulosis   . Encounter for antineoplastic chemotherapy 01/15/2016  . Hypertension     ALLERGIES:  has No Known Allergies.  MEDICATIONS:  Current Outpatient Medications  Medication Sig Dispense Refill  . ALPRAZolam (XANAX) 1 MG tablet Take 1 mg by mouth 3 (three) times daily.  2  . aspirin EC 81 MG tablet Take 81 mg by mouth.    [name] Kitchen lisinopril-hydrochlorothiazide (PRINZIDE,ZESTORETIC) 20-25 MG tablet Take 1 tablet by mouth daily.  1  . Multiple Vitamin (MULTIVITAMIN WITH MINERALS) TABS tablet Take 1 tablet by mouth daily.    . Psyllium (METAMUCIL PO)  Take 1 capsule by mouth daily.    . traMADol (ULTRAM) 50 MG tablet Take 1 tablet (50 mg total) 2 (two) times daily as needed by mouth. 30 tablet 0   No current facility-administered medications for this visit.     SURGICAL HISTORY: No past surgical history on file.  REVIEW OF SYSTEMS:   Review of Systems  Constitutional: Negative for appetite change, chills, fatigue, fever and unexpected weight change.  HENT:   Negative for mouth sores, nosebleeds, sore throat and trouble swallowing.   Eyes: Negative for eye problems and icterus.  Respiratory: Negative for cough, hemoptysis, shortness of breath and wheezing.   Cardiovascular: Negative for chest pain and leg swelling.  Gastrointestinal: Negative for abdominal pain, constipation, diarrhea, nausea and vomiting.  Genitourinary: Negative for bladder incontinence, difficulty urinating, dysuria, frequency and hematuria.   Musculoskeletal: Negative for back pain, gait problem, neck pain and neck stiffness. Positive for right ankle pain Skin: Negative for itching and rash.  Neurological: Negative for dizziness, extremity weakness, gait problem, headaches, light-headedness and seizures.  Hematological: Negative for adenopathy. Does not bruise/bleed easily.  Psychiatric/Behavioral: Negative for confusion, depression and sleep disturbance. The patient is not nervous/anxious.     PHYSICAL EXAMINATION:  Blood pressure 120/64, pulse 67, temperature 97.8 F (36.6 C), temperature source Oral, resp. rate 18, height 5\' 6"  (1.676 m), weight 173 lb (78.5 kg), SpO2 100 %.  ECOG PERFORMANCE STATUS: 1 - Symptomatic but completely ambulatory  Physical Exam  Constitutional: Oriented to person, place, and time and well-developed, well-nourished, and in no distress. No distress.  HENT:  Head: Normocephalic and atraumatic.  Mouth/Throat: Oropharynx  is clear and moist. No oropharyngeal exudate.  Eyes: Conjunctivae are normal. Right eye exhibits no discharge.  Left eye exhibits no discharge. No scleral icterus.  Neck: Normal range of motion. Neck supple.  Cardiovascular: Normal rate, regular rhythm, normal heart sounds and intact distal pulses.   Pulmonary/Chest: Effort normal and breath sounds normal. No respiratory distress. No wheezes. No rales.  Abdominal: Soft. Bowel sounds are normal. Exhibits no distension and no mass. There is no tenderness.  Musculoskeletal: Normal range of motion. Exhibits no edema.  Lymphadenopathy:    No cervical adenopathy.  Neurological: Alert and oriented to person, place, and time. Exhibits normal muscle tone. Gait normal. Coordination normal.  Skin: Skin is warm and dry. No rash noted. Not diaphoretic. No erythema. No pallor.  Psychiatric: Mood, memory and judgment normal.  Vitals reviewed.  LABORATORY DATA: Lab Results  Component Value Date   WBC 4.1 05/05/2017   HGB 13.0 05/05/2017   HCT 38.7 05/05/2017   MCV 112.5 (H) 05/05/2017   PLT 131 (L) 05/05/2017      Chemistry      Component Value Date/Time   NA 136 05/05/2017 0935   K 4.0 05/05/2017 0935   CL 110 09/07/2014 0407   CO2 23 05/05/2017 0935   BUN 13.5 05/05/2017 0935   CREATININE 0.8 05/05/2017 0935      Component Value Date/Time   CALCIUM 9.6 05/05/2017 0935   ALKPHOS 54 05/05/2017 0935   AST 33 05/05/2017 0935   ALT 51 05/05/2017 0935   BILITOT 2.12 (H) 05/05/2017 0935       RADIOGRAPHIC STUDIES:  No results found.   ASSESSMENT/PLAN:  Paroxysmal nocturnal hemoglobinuria (PNH) ([facility]) This is a very pleasant 46 year old white male with paroxysmal nocturnal hemoglobinuria and currently on treatment with Soliris on days 1 and 15 every 4 weeks status post 33cycles. He tolerated the last cycle of his treatment well.He continues to have hyperbilirubinemia likely secondary to his disease.  The patient was seen with Dr. Earlie Server. Recommended for the patient to continue his treatment with Solirisevery 2 weeks with the same  dose. Follow-up visit will be in 4 weeks for evaluation before starting cycle #35. He was advised to call immediately if he has any concerning symptoms in the interval.  The patient voices understanding of current disease status and treatment options and is in agreement with the current care plan. All questions were answered. The patient knows to call the clinic with any problems, questions or concerns. We can certainly see the patient much sooner if necessary.   Mikey Bussing, DNP, AGPCNP-BC, AOCNP 05/05/17  ADDENDUM: Hematology/Oncology Attending: I had a face-to-face encounter with the patient today.  I recommended his care plan.  This is a very pleasant 46 years old white male with paroxysmal nocturnal hemoglobinuria.  The patient is currently undergoing treatment with Solaris on days 1 and 15 every 4 weeks is status post 33 cycles. He continues to tolerate his treatment well with no concerning complaints. He has persistent hyperbilirubinemia secondary to his disease but he also drinks alcohol at regular basis more on the weekends. I recommended for the patient to continue his current treatment with Soliris with the same dose. I will see him back for follow-up visit in 4 weeks for evaluation before the next cycle of his treatment. He was advised to call immediately if he has any concerning symptoms in the interval. Disclaimer: This note was dictated with voice recognition software. Similar sounding words can inadvertently be transcribed and  may be missed upon review. Eilleen Kempf, MD 05/05/17

## 2017-05-05 NOTE — Assessment & Plan Note (Signed)
This is a very pleasant 46 year old white male with paroxysmal nocturnal hemoglobinuria and currently on treatment with Soliris on days 1 and 15 every 4 weeks status post 33cycles. He tolerated the last cycle of his treatment well.He continues to have hyperbilirubinemia likely secondary to his disease.  The patient was seen with Dr. Earlie Server. Recommended for the patient to continue his treatment with Solirisevery 2 weeks with the same dose. Follow-up visit will be in 4 weeks for evaluation before starting cycle #35. He was advised to call immediately if he has any concerning symptoms in the interval.  The patient voices understanding of current disease status and treatment options and is in agreement with the current care plan. All questions were answered. The patient knows to call the clinic with any problems, questions or concerns. We can certainly see the patient much sooner if necessary.

## 2017-05-10 ENCOUNTER — Telehealth: Payer: Self-pay | Admitting: Internal Medicine

## 2017-05-10 NOTE — Telephone Encounter (Signed)
[facility] lab/fu/tx thru February per 11/7 los. Spoke with patient he is aware and will get updated schedule at 11/21 visit.

## 2017-05-19 ENCOUNTER — Other Ambulatory Visit (HOSPITAL_BASED_OUTPATIENT_CLINIC_OR_DEPARTMENT_OTHER): Payer: BLUE CROSS/BLUE SHIELD

## 2017-05-19 ENCOUNTER — Ambulatory Visit (HOSPITAL_BASED_OUTPATIENT_CLINIC_OR_DEPARTMENT_OTHER): Payer: BLUE CROSS/BLUE SHIELD

## 2017-05-19 ENCOUNTER — Ambulatory Visit: Payer: BLUE CROSS/BLUE SHIELD

## 2017-05-19 VITALS — BP 118/74 | HR 74 | Temp 98.3°F | Resp 16

## 2017-05-19 DIAGNOSIS — D595 Paroxysmal nocturnal hemoglobinuria [Marchiafava-Micheli]: Secondary | ICD-10-CM | POA: Diagnosis not present

## 2017-05-19 DIAGNOSIS — Z5112 Encounter for antineoplastic immunotherapy: Secondary | ICD-10-CM | POA: Diagnosis not present

## 2017-05-19 LAB — CBC WITH DIFFERENTIAL/PLATELET
BASO%: 0.6 % (ref 0.0–2.0)
Basophils Absolute: 0 10*3/uL (ref 0.0–0.1)
EOS%: 4.1 % (ref 0.0–7.0)
Eosinophils Absolute: 0.2 10*3/uL (ref 0.0–0.5)
HEMATOCRIT: 37.4 % — AB (ref 38.4–49.9)
HEMOGLOBIN: 12.5 g/dL — AB (ref 13.0–17.1)
LYMPH#: 1.1 10*3/uL (ref 0.9–3.3)
LYMPH%: 23.1 % (ref 14.0–49.0)
MCH: 37.8 pg — ABNORMAL HIGH (ref 27.2–33.4)
MCHC: 33.4 g/dL (ref 32.0–36.0)
MCV: 113 fL — ABNORMAL HIGH (ref 79.3–98.0)
MONO#: 0.4 10*3/uL (ref 0.1–0.9)
MONO%: 8 % (ref 0.0–14.0)
NEUT%: 64.2 % (ref 39.0–75.0)
NEUTROS ABS: 3 10*3/uL (ref 1.5–6.5)
Platelets: 139 10*3/uL — ABNORMAL LOW (ref 140–400)
RBC: 3.31 10*6/uL — ABNORMAL LOW (ref 4.20–5.82)
RDW: 14.7 % — AB (ref 11.0–14.6)
WBC: 4.6 10*3/uL (ref 4.0–10.3)

## 2017-05-19 LAB — LACTATE DEHYDROGENASE: LDH: 226 U/L (ref 125–245)

## 2017-05-19 LAB — COMPREHENSIVE METABOLIC PANEL
ALT: 88 U/L — AB (ref 0–55)
AST: 52 U/L — AB (ref 5–34)
Albumin: 4.4 g/dL (ref 3.5–5.0)
Alkaline Phosphatase: 57 U/L (ref 40–150)
Anion Gap: 11 mEq/L (ref 3–11)
BUN: 14 mg/dL (ref 7.0–26.0)
CALCIUM: 9.2 mg/dL (ref 8.4–10.4)
CHLORIDE: 103 meq/L (ref 98–109)
CO2: 24 mEq/L (ref 22–29)
CREATININE: 0.9 mg/dL (ref 0.7–1.3)
EGFR: >60 mL/min/{1.73_m2} (ref >60)
Glucose: 108 mg/dl (ref 70–140)
Potassium: 3.8 mEq/L (ref 3.5–5.1)
Sodium: 138 mEq/L (ref 136–145)
Total Bilirubin: 2.58 mg/dL — ABNORMAL HIGH (ref 0.20–1.20)
Total Protein: 7.2 g/dL (ref 6.4–8.3)

## 2017-05-19 MED ORDER — SODIUM CHLORIDE 0.9 % IV SOLN
900.0000 mg | Freq: Once | INTRAVENOUS | Status: AC
  Administered 2017-05-19: 900 mg via INTRAVENOUS
  Filled 2017-05-19: qty 90

## 2017-05-19 MED ORDER — SODIUM CHLORIDE 0.9 % IV SOLN
Freq: Once | INTRAVENOUS | Status: AC
  Administered 2017-05-19: 14:00:00 via INTRAVENOUS

## 2017-05-19 NOTE — Patient Instructions (Signed)
[facility] Discharge Instructions for Patients Receiving Chemotherapy  Today you received the following chemotherapy agents:  Soliris.  To help prevent nausea and vomiting after your treatment, we encourage you to take your nausea medication as directed.   If you develop nausea and vomiting that is not controlled by your nausea medication, call the clinic.   BELOW ARE SYMPTOMS THAT SHOULD BE REPORTED IMMEDIATELY:  *FEVER GREATER THAN 100.5 F  *CHILLS WITH OR WITHOUT FEVER  NAUSEA AND VOMITING THAT IS NOT CONTROLLED WITH YOUR NAUSEA MEDICATION  *UNUSUAL SHORTNESS OF BREATH  *UNUSUAL BRUISING OR BLEEDING  TENDERNESS IN MOUTH AND THROAT WITH OR WITHOUT PRESENCE OF ULCERS  *URINARY PROBLEMS  *BOWEL PROBLEMS  UNUSUAL RASH Items with * indicate a potential emergency and should be followed up as soon as possible.  Feel free to call the clinic should you have any questions or concerns. The clinic phone number is [phone].  Please show the [facility] at check-in to the Emergency Department and triage nurse.

## 2017-06-02 ENCOUNTER — Ambulatory Visit (HOSPITAL_BASED_OUTPATIENT_CLINIC_OR_DEPARTMENT_OTHER): Payer: BLUE CROSS/BLUE SHIELD

## 2017-06-02 ENCOUNTER — Ambulatory Visit: Payer: BLUE CROSS/BLUE SHIELD | Admitting: Oncology

## 2017-06-02 ENCOUNTER — Encounter: Payer: Self-pay | Admitting: Oncology

## 2017-06-02 ENCOUNTER — Other Ambulatory Visit (HOSPITAL_BASED_OUTPATIENT_CLINIC_OR_DEPARTMENT_OTHER): Payer: BLUE CROSS/BLUE SHIELD

## 2017-06-02 VITALS — BP 113/67 | HR 74 | Temp 98.2°F | Resp 18 | Ht 66.0 in | Wt 171.7 lb

## 2017-06-02 DIAGNOSIS — D595 Paroxysmal nocturnal hemoglobinuria [Marchiafava-Micheli]: Secondary | ICD-10-CM | POA: Diagnosis not present

## 2017-06-02 DIAGNOSIS — Z5112 Encounter for antineoplastic immunotherapy: Secondary | ICD-10-CM

## 2017-06-02 DIAGNOSIS — Z5111 Encounter for antineoplastic chemotherapy: Secondary | ICD-10-CM

## 2017-06-02 LAB — CBC WITH DIFFERENTIAL/PLATELET
BASO%: 0.4 % (ref 0.0–2.0)
Basophils Absolute: 0 10*3/uL (ref 0.0–0.1)
EOS%: 3.7 % (ref 0.0–7.0)
Eosinophils Absolute: 0.2 10*3/uL (ref 0.0–0.5)
HCT: 38.3 % — ABNORMAL LOW (ref 38.4–49.9)
HEMOGLOBIN: 12.8 g/dL — AB (ref 13.0–17.1)
LYMPH%: 21.9 % (ref 14.0–49.0)
MCH: 37.9 pg — AB (ref 27.2–33.4)
MCHC: 33.4 g/dL (ref 32.0–36.0)
MCV: 113.3 fL — AB (ref 79.3–98.0)
MONO#: 0.4 10*3/uL (ref 0.1–0.9)
MONO%: 8.2 % (ref 0.0–14.0)
NEUT%: 65.8 % (ref 39.0–75.0)
NEUTROS ABS: 3.4 10*3/uL (ref 1.5–6.5)
Platelets: 142 10*3/uL (ref 140–400)
RBC: 3.38 10*6/uL — AB (ref 4.20–5.82)
RDW: 14.7 % — AB (ref 11.0–14.6)
WBC: 5.1 10*3/uL (ref 4.0–10.3)
lymph#: 1.1 10*3/uL (ref 0.9–3.3)

## 2017-06-02 LAB — COMPREHENSIVE METABOLIC PANEL
ALBUMIN: 4.5 g/dL (ref 3.5–5.0)
ALT: 75 U/L — AB (ref 0–55)
ANION GAP: 11 meq/L (ref 3–11)
AST: 50 U/L — AB (ref 5–34)
Alkaline Phosphatase: 57 U/L (ref 40–150)
BILIRUBIN TOTAL: 2.47 mg/dL — AB (ref 0.20–1.20)
BUN: 13.8 mg/dL (ref 7.0–26.0)
CALCIUM: 9.3 mg/dL (ref 8.4–10.4)
CO2: 23 mEq/L (ref 22–29)
CREATININE: 0.9 mg/dL (ref 0.7–1.3)
Chloride: 103 mEq/L (ref 98–109)
EGFR: >60 mL/min/{1.73_m2} (ref >60)
Glucose: 98 mg/dl (ref 70–140)
Potassium: 3.8 mEq/L (ref 3.5–5.1)
Sodium: 137 mEq/L (ref 136–145)
TOTAL PROTEIN: 7.2 g/dL (ref 6.4–8.3)

## 2017-06-02 LAB — LACTATE DEHYDROGENASE: LDH: 231 U/L (ref 125–245)

## 2017-06-02 MED ORDER — SODIUM CHLORIDE 0.9 % IV SOLN
900.0000 mg | Freq: Once | INTRAVENOUS | Status: AC
  Administered 2017-06-02: 900 mg via INTRAVENOUS
  Filled 2017-06-02: qty 90

## 2017-06-02 MED ORDER — SODIUM CHLORIDE 0.9 % IV SOLN
Freq: Once | INTRAVENOUS | Status: AC
  Administered 2017-06-02: 14:00:00 via INTRAVENOUS

## 2017-06-02 NOTE — Progress Notes (Signed)
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  PRIOR THERAPY: The patient was previously treated with a tapering dose of prednisone and high-dose IVIG for the hemolytic anemia before his diagnosis of paroxysmal nocturnal hemoglobinuria.  CURRENT THERAPY:Soliris (Eculizumab) initially with induction 600 MG IV weekly for 4 weeks and currently on maintenance treatment 900 MG IV every 2 weeks. First dose of treatment was given on 11/21/2014. Status post 33cycles.  INTERVAL HISTORY: Travis Mcdaniel 46 y.o. male returns for routine follow-up visit by himself.  Patient is feeling fine today with no specific complaints.  The patient denies fevers and chills.  Denies chest pain, shortness of breath, cough, hemoptysis.  Denies nausea, vomiting, constipation, diarrhea.  He continues to tolerate treatment with Soliris fairly well.  The patient is here for evaluation prior to cycle #34.  MEDICAL HISTORY: Past Medical History:  Diagnosis Date  . Diverticulosis   . Encounter for antineoplastic chemotherapy 01/15/2016  . Hypertension     ALLERGIES:  has No Known Allergies.  MEDICATIONS:  Current Outpatient Medications  Medication Sig Dispense Refill  . ALPRAZolam (XANAX) 1 MG tablet Take 1 mg by mouth 3 (three) times daily.  2  . aspirin EC 81 MG tablet Take 81 mg by mouth.    [name] Kitchen lisinopril-hydrochlorothiazide (PRINZIDE,ZESTORETIC) 20-25 MG tablet Take 1 tablet by mouth daily.  1  . Multiple Vitamin (MULTIVITAMIN WITH MINERALS) TABS tablet Take 1 tablet by mouth daily.    . Psyllium (METAMUCIL PO) Take 1 capsule by mouth daily.    . traMADol (ULTRAM) 50 MG tablet Take 1 tablet (50 mg total) 2 (two) times daily as needed by mouth. 30 tablet 0   No current facility-administered medications for this visit.     SURGICAL HISTORY: History reviewed.  No pertinent surgical history.  REVIEW OF SYSTEMS:   Review of Systems  Constitutional: Negative for appetite change, chills, fatigue, fever and unexpected weight change.  HENT:   Negative for mouth sores, nosebleeds, sore throat and trouble swallowing.   Eyes: Negative for eye problems and icterus.  Respiratory: Negative for cough, hemoptysis, shortness of breath and wheezing.   Cardiovascular: Negative for chest pain and leg swelling.  Gastrointestinal: Negative for abdominal pain, constipation, diarrhea, nausea and vomiting.  Genitourinary: Negative for bladder incontinence, difficulty urinating, dysuria, frequency and hematuria.   Musculoskeletal: Negative for back pain, gait problem, neck pain and neck stiffness.  Skin: Negative for itching and rash.  Neurological: Negative for dizziness, extremity weakness, gait problem, headaches, light-headedness and seizures.  Hematological: Negative for adenopathy. Does not bruise/bleed easily.  Psychiatric/Behavioral: Negative for confusion, depression and sleep disturbance. The patient is not nervous/anxious.     PHYSICAL EXAMINATION:  Blood pressure 113/67, pulse 74, temperature 98.2 F (36.8 C), temperature source Oral, resp. rate 18, height 5\' 6"  (1.676 m), weight 171 lb 11.2 oz (77.9 kg), SpO2 100 %.  ECOG PERFORMANCE STATUS: 1 - Symptomatic but completely ambulatory  Physical Exam  Constitutional: Oriented to person, place, and time and well-developed, well-nourished, and in no distress. No distress.  HENT:  Head: Normocephalic and atraumatic.  Mouth/Throat: Oropharynx is clear and moist. No oropharyngeal exudate.  Eyes: Conjunctivae are normal. Right eye exhibits no discharge. Left eye exhibits no discharge. No scleral icterus.  Neck: Normal range of motion. Neck supple.  Cardiovascular: Normal rate, regular rhythm, normal heart sounds and intact distal pulses.  Pulmonary/Chest: Effort normal and breath sounds normal. No respiratory  distress. No wheezes. No rales.  Abdominal: Soft. Bowel sounds are normal. Exhibits no distension and no mass. There is no tenderness.  Musculoskeletal: Normal range of motion. Exhibits no edema.  Lymphadenopathy:    No cervical adenopathy.  Neurological: Alert and oriented to person, place, and time. Exhibits normal muscle tone. Gait normal. Coordination normal.  Skin: Skin is warm and dry. No rash noted. Not diaphoretic. No erythema. No pallor.  Psychiatric: Mood, memory and judgment normal.  Vitals reviewed.  LABORATORY DATA: Lab Results  Component Value Date   WBC 5.1 06/02/2017   HGB 12.8 (L) 06/02/2017   HCT 38.3 (L) 06/02/2017   MCV 113.3 (H) 06/02/2017   PLT 142 06/02/2017      Chemistry      Component Value Date/Time   NA 137 06/02/2017 1201   K 3.8 06/02/2017 1201   CL 110 09/07/2014 0407   CO2 23 06/02/2017 1201   BUN 13.8 06/02/2017 1201   CREATININE 0.9 06/02/2017 1201      Component Value Date/Time   CALCIUM 9.3 06/02/2017 1201   ALKPHOS 57 06/02/2017 1201   AST 50 (H) 06/02/2017 1201   ALT 75 (H) 06/02/2017 1201   BILITOT 2.47 (H) 06/02/2017 1201       RADIOGRAPHIC STUDIES:  No results found.   ASSESSMENT/PLAN:  Paroxysmal nocturnal hemoglobinuria (PNH) (HCC) This is a very pleasant 46 year old white male with paroxysmal nocturnal hemoglobinuria and currently on treatment with Soliris on days 1 and 15 every 4 weeks status post 33cycles. He tolerated the last cycle of his treatment well.He continues to have hyperbilirubinemia likely secondary to his disease.  Recommended for the patient to continue his treatment with Solirisevery 2 weeks with the same dose.  We discussed that he has mild elevation in his liver enzymes.  The patient reports that he has had some holiday gatherings and has had some alcohol recently.  We will continue to watch his liver enzymes.  He was encouraged to limit his alcohol intake.  Follow-up visit will be in 4 weeks for  evaluation before starting his next cycle of treatment. He was advised to call immediately if he has any concerning symptoms in the interval.  The patient voices understanding of current disease status and treatment options and is in agreement with the current care plan. All questions were answered. The patient knows to call the clinic with any problems, questions or concerns. We can certainly see the patient much sooner if necessary.  No orders of the defined types were placed in this encounter.   Mikey Bussing, DNP, AGPCNP-BC, AOCNP 06/02/17

## 2017-06-02 NOTE — Progress Notes (Signed)
Per Cyril Mourning NP ok to tx toady with bilirubin 2.47

## 2017-06-02 NOTE — Patient Instructions (Signed)
Eculizumab injection What is this medicine? ECULIZUMAB (ek yoo LYE zyoo mab) is a monoclonal antibody. It is used to treat a rare kind of anemia called paroxysmal nocturnal hemoglobinuria or PNH. It may help prevent the loss of blood in patients with PNH. It is also used to treat atypical hemolytic uremic syndrome and myasthenia gravis. This medicine may be used for other purposes; ask your health care provider or pharmacist if you have questions. COMMON BRAND NAME(S): Soliris What should I tell my health care provider before I take this medicine? They need to know if you have any of these conditions: -infection -not received meningococcal vaccine -an unusual or allergic reaction to this eculizumab, other medicines, foods, dyes, or preservatives -pregnant or trying to get pregnant -breast-feeding How should I use this medicine? This medicine is for infusion into a vein. It is given by a health care professional in a hospital or clinic setting. A special MedGuide will be given to you by the pharmacist with each prescription and refill. Be sure to read this information carefully each time. Talk to your pediatrician regarding the use of this medicine in children. Special care may be needed. Overdosage: If you think you have taken too much of this medicine contact a poison control center or emergency room at once. NOTE: This medicine is only for you. Do not share this medicine with others. What if I miss a dose? It is important not to miss your dose. Call your doctor or health care professional if you are unable to keep an appointment. What may interact with this medicine? Interactions are not expected. This list may not describe all possible interactions. Give your health care provider a list of all the medicines, herbs, non-prescription drugs, or dietary supplements you use. Also tell them if you smoke, drink alcohol, or use illegal drugs. Some items may interact with your medicine. What should  I watch for while using this medicine? Your condition will be monitored carefully while you are receiving this medicine. You will need regular blood work. Tell your doctor or healthcare professional if your symptoms do not start to get better or if they get worse. You may have sudden breakdown of your red blood cells after you stop taking this medicine. You will need to be followed by your doctor for 8 weeks or more after this therapy is complete. This medicine may decrease your body's ability to fight infection. Avoid being around people who are sick. Carry the Patient Safety Card given to you at all times. Seek medical help if you have any of the symptoms listed on the card. What side effects may I notice from receiving this medicine? Side effects that you should report to your doctor or health care professional as soon as possible: -allergic reactions like skin rash, itching or hives, swelling of the face, lips, or tongue -back pain -breathing problems -bruising, pinpoint red spots on the skin -confusion -eyes sensitive to light -fast, irregular heartbeat -fever, chills, or any other sign of infection -pain, swelling, warmth in the leg -pain, tingling, numbness in the hands or feet -problems with balance, talking, walking -seizures -stiff neck -unusually weak or tired -vomiting Side effects that usually do not require medical attention (report to your doctor or health care professional if they continue or are bothersome): -aches and pains -constipation -headache -nausea -runny nose or colds -sore throat This list may not describe all possible side effects. Call your doctor for medical advice about side effects. You may report side effects   to FDA at 1-800-FDA-1088. Where should I keep my medicine? This drug is given in a hospital or clinic and will not be stored at home. NOTE: This sheet is a summary. It may not cover all possible information. If you have questions about this  medicine, talk to your doctor, pharmacist, or health care provider.  2018 Elsevier/Gold Standard (2016-04-23 11:17:44)  

## 2017-06-02 NOTE — Assessment & Plan Note (Addendum)
This is a very pleasant 46 year old white male with paroxysmal nocturnal hemoglobinuria and currently on treatment with Soliris on days 1 and 15 every 4 weeks status post 33cycles. He tolerated the last cycle of his treatment well.He continues to have hyperbilirubinemia likely secondary to his disease.  Recommended for the patient to continue his treatment with Solirisevery 2 weeks with the same dose.  We discussed that he has mild elevation in his liver enzymes.  The patient reports that he has had some holiday gatherings and has had some alcohol recently.  We will continue to watch his liver enzymes.  He was encouraged to limit his alcohol intake.  Follow-up visit will be in 4 weeks for evaluation before starting his next cycle of treatment. He was advised to call immediately if he has any concerning symptoms in the interval.  The patient voices understanding of current disease status and treatment options and is in agreement with the current care plan. All questions were answered. The patient knows to call the clinic with any problems, questions or concerns. We can certainly see the patient much sooner if necessary.

## 2017-06-16 ENCOUNTER — Ambulatory Visit (HOSPITAL_BASED_OUTPATIENT_CLINIC_OR_DEPARTMENT_OTHER): Payer: BLUE CROSS/BLUE SHIELD

## 2017-06-16 ENCOUNTER — Other Ambulatory Visit (HOSPITAL_BASED_OUTPATIENT_CLINIC_OR_DEPARTMENT_OTHER): Payer: BLUE CROSS/BLUE SHIELD

## 2017-06-16 VITALS — BP 125/76 | HR 96 | Temp 98.0°F | Resp 18

## 2017-06-16 DIAGNOSIS — D595 Paroxysmal nocturnal hemoglobinuria [Marchiafava-Micheli]: Secondary | ICD-10-CM

## 2017-06-16 DIAGNOSIS — Z5112 Encounter for antineoplastic immunotherapy: Secondary | ICD-10-CM

## 2017-06-16 LAB — CBC WITH DIFFERENTIAL/PLATELET
BASO%: 0.5 % (ref 0.0–2.0)
BASOS ABS: 0 10*3/uL (ref 0.0–0.1)
EOS ABS: 0.2 10*3/uL (ref 0.0–0.5)
EOS%: 5.1 % (ref 0.0–7.0)
HEMATOCRIT: 36 % — AB (ref 38.4–49.9)
HEMOGLOBIN: 11.7 g/dL — AB (ref 13.0–17.1)
LYMPH#: 0.9 10*3/uL (ref 0.9–3.3)
LYMPH%: 25.1 % (ref 14.0–49.0)
MCH: 37 pg — AB (ref 27.2–33.4)
MCHC: 32.5 g/dL (ref 32.0–36.0)
MCV: 113.9 fL — AB (ref 79.3–98.0)
MONO#: 0.3 10*3/uL (ref 0.1–0.9)
MONO%: 6.7 % (ref 0.0–14.0)
NEUT%: 62.6 % (ref 39.0–75.0)
NEUTROS ABS: 2.4 10*3/uL (ref 1.5–6.5)
Platelets: 142 10*3/uL (ref 140–400)
RBC: 3.16 10*6/uL — ABNORMAL LOW (ref 4.20–5.82)
RDW: 14.6 % (ref 11.0–14.6)
WBC: 3.8 10*3/uL — AB (ref 4.0–10.3)

## 2017-06-16 LAB — COMPREHENSIVE METABOLIC PANEL
ALT: 66 U/L — AB (ref 0–55)
ANION GAP: 11 meq/L (ref 3–11)
AST: 36 U/L — ABNORMAL HIGH (ref 5–34)
Albumin: 4.3 g/dL (ref 3.5–5.0)
Alkaline Phosphatase: 58 U/L (ref 40–150)
BUN: 14.3 mg/dL (ref 7.0–26.0)
CALCIUM: 8.9 mg/dL (ref 8.4–10.4)
CHLORIDE: 104 meq/L (ref 98–109)
CO2: 23 meq/L (ref 22–29)
Creatinine: 0.9 mg/dL (ref 0.7–1.3)
Glucose: 88 mg/dl (ref 70–140)
POTASSIUM: 3.9 meq/L (ref 3.5–5.1)
Sodium: 137 mEq/L (ref 136–145)
Total Bilirubin: 2.27 mg/dL — ABNORMAL HIGH (ref 0.20–1.20)
Total Protein: 6.8 g/dL (ref 6.4–8.3)

## 2017-06-16 LAB — LACTATE DEHYDROGENASE: LDH: 227 U/L (ref 125–245)

## 2017-06-16 MED ORDER — SODIUM CHLORIDE 0.9 % IV SOLN
Freq: Once | INTRAVENOUS | Status: AC
  Administered 2017-06-16: 14:00:00 via INTRAVENOUS

## 2017-06-16 MED ORDER — SODIUM CHLORIDE 0.9 % IV SOLN
900.0000 mg | Freq: Once | INTRAVENOUS | Status: AC
  Administered 2017-06-16: 900 mg via INTRAVENOUS
  Filled 2017-06-16: qty 90

## 2017-06-16 NOTE — Patient Instructions (Signed)
[facility] Discharge Instructions for Patients Receiving Chemotherapy  Today you received the following chemotherapy agents Soliris  To help prevent nausea and vomiting after your treatment, we encourage you to take your nausea medication as directed   If you develop nausea and vomiting that is not controlled by your nausea medication, call the clinic.   BELOW ARE SYMPTOMS THAT SHOULD BE REPORTED IMMEDIATELY:  *FEVER GREATER THAN 100.5 F  *CHILLS WITH OR WITHOUT FEVER  NAUSEA AND VOMITING THAT IS NOT CONTROLLED WITH YOUR NAUSEA MEDICATION  *UNUSUAL SHORTNESS OF BREATH  *UNUSUAL BRUISING OR BLEEDING  TENDERNESS IN MOUTH AND THROAT WITH OR WITHOUT PRESENCE OF ULCERS  *URINARY PROBLEMS  *BOWEL PROBLEMS  UNUSUAL RASH Items with * indicate a potential emergency and should be followed up as soon as possible.  Feel free to call the clinic should you have any questions or concerns. The clinic phone number is [phone].  Please show the [facility] at check-in to the Emergency Department and triage nurse.

## 2017-06-16 NOTE — Progress Notes (Signed)
Patient declined to wait for 1hr post-infusion observation period. Patient alert and responsive; denies concerns on exit.

## 2017-06-24 ENCOUNTER — Telehealth: Payer: Self-pay | Admitting: Internal Medicine

## 2017-06-24 NOTE — Telephone Encounter (Signed)
Scheduled appt per 12/27 sch msg - spoke with patient and confirmed the appointment.

## 2017-06-28 ENCOUNTER — Encounter: Payer: Self-pay | Admitting: Nurse Practitioner

## 2017-06-28 ENCOUNTER — Ambulatory Visit: Payer: BLUE CROSS/BLUE SHIELD | Admitting: Nurse Practitioner

## 2017-06-28 ENCOUNTER — Other Ambulatory Visit (HOSPITAL_BASED_OUTPATIENT_CLINIC_OR_DEPARTMENT_OTHER): Payer: BLUE CROSS/BLUE SHIELD

## 2017-06-28 ENCOUNTER — Ambulatory Visit (HOSPITAL_BASED_OUTPATIENT_CLINIC_OR_DEPARTMENT_OTHER): Payer: BLUE CROSS/BLUE SHIELD

## 2017-06-28 ENCOUNTER — Other Ambulatory Visit: Payer: Self-pay | Admitting: Internal Medicine

## 2017-06-28 VITALS — BP 121/64 | HR 76 | Temp 98.3°F | Resp 18 | Ht 66.0 in | Wt 170.4 lb

## 2017-06-28 DIAGNOSIS — D595 Paroxysmal nocturnal hemoglobinuria [Marchiafava-Micheli]: Secondary | ICD-10-CM

## 2017-06-28 DIAGNOSIS — Z5112 Encounter for antineoplastic immunotherapy: Secondary | ICD-10-CM

## 2017-06-28 LAB — CBC WITH DIFFERENTIAL/PLATELET
BASO%: 0.5 % (ref 0.0–2.0)
Basophils Absolute: 0 10*3/uL (ref 0.0–0.1)
EOS%: 4.4 % (ref 0.0–7.0)
Eosinophils Absolute: 0.2 10*3/uL (ref 0.0–0.5)
HEMATOCRIT: 41.7 % (ref 38.4–49.9)
HEMOGLOBIN: 14.2 g/dL (ref 13.0–17.1)
LYMPH#: 1.1 10*3/uL (ref 0.9–3.3)
LYMPH%: 19.5 % (ref 14.0–49.0)
MCH: 38.5 pg — ABNORMAL HIGH (ref 27.2–33.4)
MCHC: 34.1 g/dL (ref 32.0–36.0)
MCV: 113 fL — ABNORMAL HIGH (ref 79.3–98.0)
MONO#: 0.6 10*3/uL (ref 0.1–0.9)
MONO%: 10 % (ref 0.0–14.0)
NEUT%: 65.6 % (ref 39.0–75.0)
NEUTROS ABS: 3.6 10*3/uL (ref 1.5–6.5)
PLATELETS: 124 10*3/uL — AB (ref 140–400)
RBC: 3.69 10*6/uL — ABNORMAL LOW (ref 4.20–5.82)
RDW: 14.6 % (ref 11.0–14.6)
WBC: 5.5 10*3/uL (ref 4.0–10.3)

## 2017-06-28 LAB — COMPREHENSIVE METABOLIC PANEL
ALBUMIN: 4.5 g/dL (ref 3.5–5.0)
ALK PHOS: 66 U/L (ref 40–150)
ALT: 60 U/L — ABNORMAL HIGH (ref 0–55)
ANION GAP: 11 meq/L (ref 3–11)
AST: 40 U/L — ABNORMAL HIGH (ref 5–34)
BUN: 17.1 mg/dL (ref 7.0–26.0)
CO2: 24 mEq/L (ref 22–29)
Calcium: 9.1 mg/dL (ref 8.4–10.4)
Chloride: 102 mEq/L (ref 98–109)
Creatinine: 1 mg/dL (ref 0.7–1.3)
GLUCOSE: 92 mg/dL (ref 70–140)
POTASSIUM: 4.1 meq/L (ref 3.5–5.1)
SODIUM: 136 meq/L (ref 136–145)
Total Bilirubin: 2.87 mg/dL — ABNORMAL HIGH (ref 0.20–1.20)
Total Protein: 7.3 g/dL (ref 6.4–8.3)

## 2017-06-28 LAB — LACTATE DEHYDROGENASE: LDH: 198 U/L (ref 125–245)

## 2017-06-28 MED ORDER — SODIUM CHLORIDE 0.9 % IV SOLN
Freq: Once | INTRAVENOUS | Status: AC
  Administered 2017-06-28: 16:00:00 via INTRAVENOUS

## 2017-06-28 MED ORDER — SODIUM CHLORIDE 0.9 % IV SOLN
900.0000 mg | Freq: Once | INTRAVENOUS | Status: AC
  Administered 2017-06-28: 900 mg via INTRAVENOUS
  Filled 2017-06-28: qty 90

## 2017-06-28 NOTE — Patient Instructions (Signed)
[facility] Discharge Instructions for Patients Receiving Chemotherapy  Today you received the following chemotherapy agents Soliris  To help prevent nausea and vomiting after your treatment, we encourage you to take your nausea medication as directed   If you develop nausea and vomiting that is not controlled by your nausea medication, call the clinic.   BELOW ARE SYMPTOMS THAT SHOULD BE REPORTED IMMEDIATELY:  *FEVER GREATER THAN 100.5 F  *CHILLS WITH OR WITHOUT FEVER  NAUSEA AND VOMITING THAT IS NOT CONTROLLED WITH YOUR NAUSEA MEDICATION  *UNUSUAL SHORTNESS OF BREATH  *UNUSUAL BRUISING OR BLEEDING  TENDERNESS IN MOUTH AND THROAT WITH OR WITHOUT PRESENCE OF ULCERS  *URINARY PROBLEMS  *BOWEL PROBLEMS  UNUSUAL RASH Items with * indicate a potential emergency and should be followed up as soon as possible.  Feel free to call the clinic should you have any questions or concerns. The clinic phone number is [phone].  Please show the [facility] at check-in to the Emergency Department and triage nurse.

## 2017-06-28 NOTE — Progress Notes (Signed)
Pt refused to stay for observation.

## 2017-06-28 NOTE — Progress Notes (Signed)
  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  PRIOR THERAPY: The patient was previously treated with a tapering dose of prednisone and high-dose IVIG for the hemolytic anemia before his diagnosis of paroxysmal nocturnal hemoglobinuria.  CURRENT THERAPY:Soliris (Eculizumab) initially with induction 600 MG IV weekly for 4 weeks and currently on maintenance treatment 900 MG IV every 2 weeks. First dose of treatment was given on 11/21/2014. Status post 34cycles.  INTERVAL HISTORY:   Travis Mcdaniel returns as scheduled.  He completed cycle 34 Soliris 06/02/2017, 06/16/2017.  He has no complaints today.  He denies nausea/vomiting.  No mouth sores.  No diarrhea.  No rash.  No shortness of breath.  Occasional cough.  No hemoptysis.  No fever.  Objective:  Vital signs in last 24 hours:  Blood pressure 121/64, pulse 76, temperature 98.3 F (36.8 C), temperature source Oral, resp. rate 18, height 5\' 6"  (1.676 m), weight 170 lb 6.4 oz (77.3 kg), SpO2 100 %.    HEENT: No thrush or ulcers. Resp: Lungs clear bilaterally. Cardio: Regular rate and rhythm. GI: Abdomen soft and nontender.  No hepatosplenomegaly. Vascular: No leg edema. Neuro: Alert and oriented. Skin: No rash.   Lab Results:  Lab Results  Component Value Date   WBC 5.5 06/28/2017   HGB 14.2 06/28/2017   HCT 41.7 06/28/2017   MCV 113.0 (H) 06/28/2017   PLT 124 (L) 06/28/2017   NEUTROABS 3.6 06/28/2017    Imaging:  No results found.  Medications: I have reviewed the patient's current medications.  Assessment/Plan: 1. 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  Currently on treatment with Soliris day 1 and 15 every 4 weeks.  He has completed 34 cycles.    Disposition: Travis Mcdaniel appears stable.  Plan to proceed with cycle 35-day 1 Soliris today as  scheduled.  He will return for the day 15 treatment on 07/14/2017.  He will be seen in follow-up in 4 weeks.  He will contact the office in the interim with any problems.  Plan reviewed with Dr. Julien Nordmann.    Ned Card ANP/GNP-BC   06/28/2017  1:51 PM

## 2017-06-30 ENCOUNTER — Other Ambulatory Visit: Payer: BLUE CROSS/BLUE SHIELD

## 2017-06-30 ENCOUNTER — Ambulatory Visit: Payer: BLUE CROSS/BLUE SHIELD | Admitting: Oncology

## 2017-06-30 ENCOUNTER — Ambulatory Visit: Payer: BLUE CROSS/BLUE SHIELD

## 2017-07-14 ENCOUNTER — Inpatient Hospital Stay: Payer: BLUE CROSS/BLUE SHIELD | Attending: Internal Medicine

## 2017-07-14 ENCOUNTER — Inpatient Hospital Stay: Payer: BLUE CROSS/BLUE SHIELD

## 2017-07-14 VITALS — BP 127/79 | HR 77 | Temp 98.5°F | Resp 17

## 2017-07-14 DIAGNOSIS — Z5112 Encounter for antineoplastic immunotherapy: Secondary | ICD-10-CM | POA: Insufficient documentation

## 2017-07-14 DIAGNOSIS — D595 Paroxysmal nocturnal hemoglobinuria [Marchiafava-Micheli]: Secondary | ICD-10-CM | POA: Insufficient documentation

## 2017-07-14 LAB — CBC WITH DIFFERENTIAL/PLATELET
Basophils Absolute: 0 10*3/uL (ref 0.0–0.1)
Basophils Relative: 1 %
Eosinophils Absolute: 0.2 10*3/uL (ref 0.0–0.5)
Eosinophils Relative: 5 %
HEMATOCRIT: 38.4 % (ref 38.4–49.9)
Hemoglobin: 12.7 g/dL — ABNORMAL LOW (ref 13.0–17.1)
LYMPHS PCT: 23 %
Lymphs Abs: 1 10*3/uL (ref 0.9–3.3)
MCH: 37.5 pg — ABNORMAL HIGH (ref 27.2–33.4)
MCHC: 33.1 g/dL (ref 32.0–36.0)
MCV: 113.3 fL — AB (ref 79.3–98.0)
Monocytes Absolute: 0.3 10*3/uL (ref 0.1–0.9)
Monocytes Relative: 7 %
NEUTROS ABS: 2.9 10*3/uL (ref 1.5–6.5)
Neutrophils Relative %: 64 %
Platelets: 138 10*3/uL — ABNORMAL LOW (ref 140–400)
RBC: 3.39 MIL/uL — AB (ref 4.20–5.82)
RDW: 14.6 % (ref 11.0–15.6)
WBC: 4.4 10*3/uL (ref 4.0–10.3)

## 2017-07-14 LAB — LACTATE DEHYDROGENASE: LDH: 228 U/L (ref 125–245)

## 2017-07-14 LAB — COMPREHENSIVE METABOLIC PANEL
ALT: 149 U/L — ABNORMAL HIGH (ref 0–55)
ANION GAP: 13 — AB (ref 3–11)
AST: 90 U/L — ABNORMAL HIGH (ref 5–34)
Albumin: 4.4 g/dL (ref 3.5–5.0)
Alkaline Phosphatase: 61 U/L (ref 40–150)
BILIRUBIN TOTAL: 2.3 mg/dL — AB (ref 0.2–1.2)
BUN: 13 mg/dL (ref 7–26)
CO2: 24 mmol/L (ref 22–29)
Calcium: 9.1 mg/dL (ref 8.4–10.4)
Chloride: 103 mmol/L (ref 98–109)
Creatinine, Ser: 0.91 mg/dL (ref 0.70–1.30)
Glucose, Bld: 105 mg/dL (ref 70–140)
Potassium: 3.7 mmol/L (ref 3.5–5.1)
Sodium: 140 mmol/L (ref 136–145)
Total Protein: 7 g/dL (ref 6.4–8.3)

## 2017-07-14 MED ORDER — SODIUM CHLORIDE 0.9 % IV SOLN
Freq: Once | INTRAVENOUS | Status: AC
Start: 2017-07-14 — End: 2017-07-14
  Administered 2017-07-14: 13:00:00 via INTRAVENOUS

## 2017-07-14 MED ORDER — SODIUM CHLORIDE 0.9 % IV SOLN
900.0000 mg | Freq: Once | INTRAVENOUS | Status: AC
  Administered 2017-07-14: 900 mg via INTRAVENOUS
  Filled 2017-07-14: qty 90

## 2017-07-14 NOTE — Patient Instructions (Signed)
[facility] Discharge Instructions for Patients Receiving Chemotherapy  Today you received the following chemotherapy agents Soliris  To help prevent nausea and vomiting after your treatment, we encourage you to take your nausea medication as directed   If you develop nausea and vomiting that is not controlled by your nausea medication, call the clinic.   BELOW ARE SYMPTOMS THAT SHOULD BE REPORTED IMMEDIATELY:  *FEVER GREATER THAN 100.5 F  *CHILLS WITH OR WITHOUT FEVER  NAUSEA AND VOMITING THAT IS NOT CONTROLLED WITH YOUR NAUSEA MEDICATION  *UNUSUAL SHORTNESS OF BREATH  *UNUSUAL BRUISING OR BLEEDING  TENDERNESS IN MOUTH AND THROAT WITH OR WITHOUT PRESENCE OF ULCERS  *URINARY PROBLEMS  *BOWEL PROBLEMS  UNUSUAL RASH Items with * indicate a potential emergency and should be followed up as soon as possible.  Feel free to call the clinic should you have any questions or concerns. The clinic phone number is [phone].  Please show the [facility] at check-in to the Emergency Department and triage nurse.

## 2017-07-14 NOTE — Progress Notes (Signed)
Pt refused to stay for 1 hour observation period. Pt verbalized understanding of risk and had no further questions. Pt left ambulatory in no apparent distress.

## 2017-07-19 ENCOUNTER — Telehealth: Payer: Self-pay | Admitting: Internal Medicine

## 2017-07-19 ENCOUNTER — Telehealth: Payer: Self-pay | Admitting: *Deleted

## 2017-07-19 DIAGNOSIS — D595 Paroxysmal nocturnal hemoglobinuria [Marchiafava-Micheli]: Secondary | ICD-10-CM

## 2017-07-19 NOTE — Telephone Encounter (Signed)
Attempted to schedule appointment with symptom management clinic - patient said that he had already left and wanted a prescription written - spoke with Stanton Kidney and he did not want to come in.

## 2017-07-19 NOTE — Telephone Encounter (Signed)
Pt requested to be seen, " I have the same thing I had before, I need antibiotics. Cyndee Berniece Salines told me to come here whenever I'm sick." Message to scheduling to have pt get labs and [facility] visit this am/

## 2017-07-19 NOTE — Telephone Encounter (Signed)
Pt in waiting room

## 2017-07-28 ENCOUNTER — Inpatient Hospital Stay: Payer: BLUE CROSS/BLUE SHIELD

## 2017-07-28 ENCOUNTER — Telehealth: Payer: Self-pay | Admitting: Oncology

## 2017-07-28 ENCOUNTER — Encounter: Payer: Self-pay | Admitting: Oncology

## 2017-07-28 ENCOUNTER — Inpatient Hospital Stay (HOSPITAL_BASED_OUTPATIENT_CLINIC_OR_DEPARTMENT_OTHER): Payer: BLUE CROSS/BLUE SHIELD | Admitting: Oncology

## 2017-07-28 VITALS — BP 107/65 | HR 81 | Temp 98.7°F | Resp 18

## 2017-07-28 VITALS — BP 114/75 | HR 74 | Temp 98.1°F | Resp 17 | Ht 66.0 in | Wt 169.9 lb

## 2017-07-28 DIAGNOSIS — D595 Paroxysmal nocturnal hemoglobinuria [Marchiafava-Micheli]: Secondary | ICD-10-CM | POA: Diagnosis not present

## 2017-07-28 DIAGNOSIS — Z5111 Encounter for antineoplastic chemotherapy: Secondary | ICD-10-CM

## 2017-07-28 DIAGNOSIS — Z5112 Encounter for antineoplastic immunotherapy: Secondary | ICD-10-CM | POA: Diagnosis not present

## 2017-07-28 LAB — COMPREHENSIVE METABOLIC PANEL
ALBUMIN: 4.3 g/dL (ref 3.5–5.0)
ALT: 60 U/L — ABNORMAL HIGH (ref 0–55)
AST: 38 U/L — ABNORMAL HIGH (ref 5–34)
Alkaline Phosphatase: 63 U/L (ref 40–150)
Anion gap: 9 (ref 3–11)
BUN: 14 mg/dL (ref 7–26)
CHLORIDE: 102 mmol/L (ref 98–109)
CO2: 24 mmol/L (ref 22–29)
Calcium: 9 mg/dL (ref 8.4–10.4)
Creatinine, Ser: 0.85 mg/dL (ref 0.70–1.30)
GFR calc Af Amer: >60 mL/min (ref >60)
GFR calc non Af Amer: >60 mL/min (ref >60)
Glucose, Bld: 90 mg/dL (ref 70–140)
POTASSIUM: 4.2 mmol/L (ref 3.5–5.1)
SODIUM: 135 mmol/L — AB (ref 136–145)
Total Bilirubin: 2.4 mg/dL — ABNORMAL HIGH (ref 0.2–1.2)
Total Protein: 7 g/dL (ref 6.4–8.3)

## 2017-07-28 LAB — CBC WITH DIFFERENTIAL/PLATELET
Basophils Absolute: 0 10*3/uL (ref 0.0–0.1)
Basophils Relative: 0 %
EOS ABS: 0.3 10*3/uL (ref 0.0–0.5)
EOS PCT: 6 %
HCT: 39.4 % (ref 38.4–49.9)
HEMOGLOBIN: 12.9 g/dL — AB (ref 13.0–17.1)
LYMPHS ABS: 1 10*3/uL (ref 0.9–3.3)
LYMPHS PCT: 20 %
MCH: 37 pg — AB (ref 27.2–33.4)
MCHC: 32.7 g/dL (ref 32.0–36.0)
MCV: 112.9 fL — AB (ref 79.3–98.0)
MONOS PCT: 12 %
Monocytes Absolute: 0.6 10*3/uL (ref 0.1–0.9)
Neutro Abs: 3 10*3/uL (ref 1.5–6.5)
Neutrophils Relative %: 62 %
PLATELETS: 138 10*3/uL — AB (ref 140–400)
RBC: 3.49 MIL/uL — AB (ref 4.20–5.82)
RDW: 15 % — ABNORMAL HIGH (ref 11.0–14.6)
WBC: 4.8 10*3/uL (ref 4.0–10.3)

## 2017-07-28 LAB — LACTATE DEHYDROGENASE: LDH: 237 U/L (ref 125–245)

## 2017-07-28 MED ORDER — SODIUM CHLORIDE 0.9 % IV SOLN
900.0000 mg | Freq: Once | INTRAVENOUS | Status: AC
  Administered 2017-07-28: 900 mg via INTRAVENOUS
  Filled 2017-07-28: qty 90

## 2017-07-28 MED ORDER — SODIUM CHLORIDE 0.9 % IV SOLN
Freq: Once | INTRAVENOUS | Status: AC
  Administered 2017-07-28: 14:00:00 via INTRAVENOUS

## 2017-07-28 NOTE — Progress Notes (Signed)
Pt declines post-solaris observation period and requests discharged. VSS at discharge. Patient education on infusion reaction risk discussed and patient verbalized understanding.

## 2017-07-28 NOTE — Progress Notes (Signed)
[facility] OFFICE PROGRESS NOTE  Travis Reach, MD [address] 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  PRIOR THERAPY: The patient was previously treated with a tapering dose of prednisone and high-dose IVIG for the hemolytic anemia before his diagnosis of paroxysmal nocturnal hemoglobinuria.  CURRENT THERAPY: Soliris (Eculizumab) initially with induction 600 MG IV weekly for 4 weeks and currently on maintenance treatment 900 MG IV every 2 weeks. First dose of treatment was given on 11/21/2014. Status post 35cycles.  INTERVAL HISTORY: Travis Mcdaniel 47 y.o. male returns for routine follow-up visit by himself.  The patient is feeling fine today has no specific complaints.  He denies fevers and chills.  Denies chest pain, shortness breath, cough, hemoptysis.  Denies nausea, vomiting, constipation, diarrhea.  Denies weight loss or night sweats.  Denies bleeding.  The patient is here for evaluation prior to starting cycle #36 of his treatment.  MEDICAL HISTORY: Past Medical History:  Diagnosis Date  . Diverticulosis   . Encounter for antineoplastic chemotherapy 01/15/2016  . Hypertension     ALLERGIES:  has No Known Allergies.  MEDICATIONS:  Current Outpatient Medications  Medication Sig Dispense Refill  . ALPRAZolam (XANAX) 1 MG tablet Take 1 mg by mouth 3 (three) times daily.  2  . aspirin EC 81 MG tablet Take 81 mg by mouth.    [name] Kitchen lisinopril-hydrochlorothiazide (PRINZIDE,ZESTORETIC) 20-25 MG tablet Take 1 tablet by mouth daily.  1  . Multiple Vitamin (MULTIVITAMIN WITH MINERALS) TABS tablet Take 1 tablet by mouth daily.    . Psyllium (METAMUCIL PO) Take 1 capsule by mouth daily.    . traMADol (ULTRAM) 50 MG tablet Take 1 tablet (50 mg total) 2 (two) times daily as needed by mouth. 30 tablet 0   No current facility-administered medications for this visit.     Facility-Administered Medications Ordered in Other Visits  Medication Dose Route Frequency Provider Last Rate Last Dose  . eculizumab (SOLIRIS) 900 mg in sodium chloride 0.9 % 90 mL infusion  900 mg Intravenous Once Curt Bears, MD 309 mL/hr at 07/28/17 1455 900 mg at 07/28/17 1455    SURGICAL HISTORY: History reviewed. No pertinent surgical history.  REVIEW OF SYSTEMS:   Review of Systems  Constitutional: Negative for appetite change, chills, fatigue, fever and unexpected weight change.  HENT:   Negative for mouth sores, nosebleeds, sore throat and trouble swallowing.   Eyes: Negative for eye problems and icterus.  Respiratory: Negative for cough, hemoptysis, shortness of breath and wheezing.   Cardiovascular: Negative for chest pain and leg swelling.  Gastrointestinal: Negative for abdominal pain, constipation, diarrhea, nausea and vomiting.  Genitourinary: Negative for bladder incontinence, difficulty urinating, dysuria, frequency and hematuria.   Musculoskeletal: Negative for back pain, gait problem, neck pain and neck stiffness.  Skin: Negative for itching and rash.  Neurological: Negative for dizziness, extremity weakness, gait problem, headaches, light-headedness and seizures.  Hematological: Negative for adenopathy. Does not bruise/bleed easily.  Psychiatric/Behavioral: Negative for confusion, depression and sleep disturbance. The patient is not nervous/anxious.     PHYSICAL EXAMINATION:  Blood pressure 114/75, pulse 74, temperature 98.1 F (36.7 C), temperature source Oral, resp. rate 17, height 5\' 6"  (1.676 m), weight 169 lb 14.4 oz (77.1 kg), SpO2 99 %.   Physical Exam  Constitutional: Oriented to person, place, and time and well-developed, well-nourished, and in no distress. No distress.  HENT:  Head: Normocephalic and atraumatic.  Mouth/Throat:  Oropharynx is clear and moist. No oropharyngeal exudate.  Eyes: Conjunctivae are normal. Right eye exhibits no  discharge. Left eye exhibits no discharge. No scleral icterus.  Neck: Normal range of motion. Neck supple.  Cardiovascular: Normal rate, regular rhythm, normal heart sounds and intact distal pulses.   Pulmonary/Chest: Effort normal and breath sounds normal. No respiratory distress. No wheezes. No rales.  Abdominal: Soft. Bowel sounds are normal. Exhibits no distension and no mass. There is no tenderness.  Musculoskeletal: Normal range of motion. Exhibits no edema.  Lymphadenopathy:    No cervical adenopathy.  Neurological: Alert and oriented to person, place, and time. Exhibits normal muscle tone. Gait normal. Coordination normal.  Skin: Skin is warm and dry. No rash noted. Not diaphoretic. No erythema. No pallor.  Psychiatric: Mood, memory and judgment normal.  Vitals reviewed.  LABORATORY DATA: Lab Results  Component Value Date   WBC 4.8 07/28/2017   HGB 12.9 (L) 07/28/2017   HCT 39.4 07/28/2017   MCV 112.9 (H) 07/28/2017   PLT 138 (L) 07/28/2017      Chemistry      Component Value Date/Time   NA 135 (L) 07/28/2017 1225   NA 136 06/28/2017 1236   K 4.2 07/28/2017 1225   K 4.1 06/28/2017 1236   CL 102 07/28/2017 1225   CO2 24 07/28/2017 1225   CO2 24 06/28/2017 1236   BUN 14 07/28/2017 1225   BUN 17.1 06/28/2017 1236   CREATININE 0.85 07/28/2017 1225   CREATININE 1.0 06/28/2017 1236      Component Value Date/Time   CALCIUM 9.0 07/28/2017 1225   CALCIUM 9.1 06/28/2017 1236   ALKPHOS 63 07/28/2017 1225   ALKPHOS 66 06/28/2017 1236   AST 38 (H) 07/28/2017 1225   AST 40 (H) 06/28/2017 1236   ALT 60 (H) 07/28/2017 1225   ALT 60 (H) 06/28/2017 1236   BILITOT 2.4 (H) 07/28/2017 1225   BILITOT 2.87 (H) 06/28/2017 1236       RADIOGRAPHIC STUDIES:  No results found.   ASSESSMENT/PLAN:  Paroxysmal nocturnal hemoglobinuria (PNH) (HCC) This is a very pleasant 47 year old white male with paroxysmal nocturnal hemoglobinuria and currently on treatment with Soliris on days  1 and 15 every 4 weeks status post 35cycles. He tolerated the last cycle of his treatment well.He continues to have hyperbilirubinemia likely secondary to his disease.  Patient was seen with Dr. Julien Nordmann. We discussed the recent approval of Ultomiris (ravulizumab) for the treatment of paroxysmal nocturnal hemoglobinuria.  We discussed that approval was based on a noninferiority trial.  Reviewed some of the reported adverse effects that were reported including headaches, upper respiratory infections, and the black box warning of meningococcal infections and sepsis.  We will continue to monitor postmarketing data of this medication and may consider at some point for this patient.  His CBC and liver enzymes remained stable.  Since he is doing well on the current treatment, recommend that he continue treatment with Soliris every 2 weeks.  Recommend that he proceed with cycle #36 today as scheduled.    Follow-up visit will be in4 weeks for evaluation before starting his next cycle of treatment.  He was advised to call immediately if he has any concerning symptoms in the interval.  The patient voices understanding of current disease status and treatment options and is in agreement with the current care plan. All questions were answered. The patient knows to call the clinic with any problems, questions or concerns. We can certainly see the  patient much sooner if necessary.  No orders of the defined types were placed in this encounter.   Mikey Bussing, DNP, AGPCNP-BC, AOCNP 07/28/17  ADDENDUM: Hematology/Oncology Attending: I had a face-to-face encounter with the patient today.  I recommended his care plan.  This is a very pleasant 47 years old white male with paroxysmal nocturnal hemoglobinuria.  The patient is currently on treatment with Solaris on days 1 and 15 every 4 weeks status post 3 5 cycles.  He has been tolerating this treatment fairly well with no concerning complaints.  He has a  baseline hyperbilirubinemia. He has been approached by a pharmaceutical company about the new approval of Ultomiris for treatment of PNH which is giving on a schedule of every 8 weeks.  This drug was approved on the basis of noninferiority study.  Unfortunately 1 of the major side effects and black box warning is high risk for sepsis and meningococcal infection. The patient has been doing very well with Solaris so far.  I recommended for him to continue with the current treatment for now. I will see him back for follow-up visit in 4 weeks for evaluation before starting cycle #37. He was advised to call immediately if he has any concerning symptoms in the interval.  Disclaimer: This note was dictated with voice recognition software. Similar sounding words can inadvertently be transcribed and may be missed upon review. Eilleen Kempf, MD 07/28/17

## 2017-07-28 NOTE — Telephone Encounter (Signed)
Scheduled appt pe r1/30 los - Patient to get an updated schedule next visit.

## 2017-07-28 NOTE — Assessment & Plan Note (Addendum)
This is a very pleasant 47 year old white male with paroxysmal nocturnal hemoglobinuria and currently on treatment with Soliris on days 1 and 15 every 4 weeks status post 35cycles. He tolerated the last cycle of his treatment well.He continues to have hyperbilirubinemia likely secondary to his disease.  Patient was seen with Dr. Julien Nordmann. We discussed the recent approval of Ultomiris (ravulizumab) for the treatment of paroxysmal nocturnal hemoglobinuria.  We discussed that approval was based on a noninferiority trial.  Reviewed some of the reported adverse effects that were reported including headaches, upper respiratory infections, and the black box warning of meningococcal infections and sepsis.  We will continue to monitor postmarketing data of this medication and may consider at some point for this patient.  His CBC and liver enzymes remained stable.  Since he is doing well on the current treatment, recommend that he continue treatment with Soliris every 2 weeks.  Recommend that he proceed with cycle #36 today as scheduled.    Follow-up visit will be in4 weeks for evaluation before starting his next cycle of treatment.  He was advised to call immediately if he has any concerning symptoms in the interval.  The patient voices understanding of current disease status and treatment options and is in agreement with the current care plan. All questions were answered. The patient knows to call the clinic with any problems, questions or concerns. We can certainly see the patient much sooner if necessary.

## 2017-07-28 NOTE — Patient Instructions (Signed)
[facility] Discharge Instructions for Patients Receiving Chemotherapy  Today you received the following chemotherapy agents:  Soliris.  To help prevent nausea and vomiting after your treatment, we encourage you to take your nausea medication as directed.   If you develop nausea and vomiting that is not controlled by your nausea medication, call the clinic.   BELOW ARE SYMPTOMS THAT SHOULD BE REPORTED IMMEDIATELY:  *FEVER GREATER THAN 100.5 F  *CHILLS WITH OR WITHOUT FEVER  NAUSEA AND VOMITING THAT IS NOT CONTROLLED WITH YOUR NAUSEA MEDICATION  *UNUSUAL SHORTNESS OF BREATH  *UNUSUAL BRUISING OR BLEEDING  TENDERNESS IN MOUTH AND THROAT WITH OR WITHOUT PRESENCE OF ULCERS  *URINARY PROBLEMS  *BOWEL PROBLEMS  UNUSUAL RASH Items with * indicate a potential emergency and should be followed up as soon as possible.  Feel free to call the clinic should you have any questions or concerns. The clinic phone number is [phone].  Please show the [facility] at check-in to the Emergency Department and triage nurse.

## 2017-08-11 ENCOUNTER — Inpatient Hospital Stay: Payer: BLUE CROSS/BLUE SHIELD | Attending: Internal Medicine

## 2017-08-11 ENCOUNTER — Inpatient Hospital Stay: Payer: BLUE CROSS/BLUE SHIELD

## 2017-08-11 VITALS — BP 117/71 | HR 87 | Temp 98.0°F | Resp 18

## 2017-08-11 DIAGNOSIS — Z5112 Encounter for antineoplastic immunotherapy: Secondary | ICD-10-CM | POA: Insufficient documentation

## 2017-08-11 DIAGNOSIS — D595 Paroxysmal nocturnal hemoglobinuria [Marchiafava-Micheli]: Secondary | ICD-10-CM | POA: Insufficient documentation

## 2017-08-11 LAB — COMPREHENSIVE METABOLIC PANEL
ALBUMIN: 4.5 g/dL (ref 3.5–5.0)
ALT: 106 U/L — ABNORMAL HIGH (ref 0–55)
AST: 69 U/L — AB (ref 5–34)
Alkaline Phosphatase: 62 U/L (ref 40–150)
Anion gap: 14 — ABNORMAL HIGH (ref 3–11)
BUN: 16 mg/dL (ref 7–26)
CHLORIDE: 102 mmol/L (ref 98–109)
CO2: 21 mmol/L — ABNORMAL LOW (ref 22–29)
Calcium: 9.1 mg/dL (ref 8.4–10.4)
Creatinine, Ser: 0.88 mg/dL (ref 0.70–1.30)
GFR calc Af Amer: >60 mL/min (ref >60)
GFR calc non Af Amer: >60 mL/min (ref >60)
GLUCOSE: 91 mg/dL (ref 70–140)
Potassium: 4.1 mmol/L (ref 3.5–5.1)
Sodium: 137 mmol/L (ref 136–145)
Total Bilirubin: 2.5 mg/dL — ABNORMAL HIGH (ref 0.2–1.2)
Total Protein: 7.2 g/dL (ref 6.4–8.3)

## 2017-08-11 LAB — CBC WITH DIFFERENTIAL/PLATELET
BASOS ABS: 0.1 10*3/uL (ref 0.0–0.1)
BASOS PCT: 1 %
EOS ABS: 0.2 10*3/uL (ref 0.0–0.5)
Eosinophils Relative: 4 %
HEMATOCRIT: 41.1 % (ref 38.4–49.9)
Hemoglobin: 13.8 g/dL (ref 13.0–17.1)
Lymphocytes Relative: 17 %
Lymphs Abs: 1 10*3/uL (ref 0.9–3.3)
MCH: 38 pg — ABNORMAL HIGH (ref 27.2–33.4)
MCHC: 33.7 g/dL (ref 32.0–36.0)
MCV: 113 fL — ABNORMAL HIGH (ref 79.3–98.0)
MONO ABS: 0.5 10*3/uL (ref 0.1–0.9)
Monocytes Relative: 9 %
NEUTROS ABS: 3.8 10*3/uL (ref 1.5–6.5)
Neutrophils Relative %: 69 %
PLATELETS: 129 10*3/uL — AB (ref 140–400)
RBC: 3.64 MIL/uL — ABNORMAL LOW (ref 4.20–5.82)
RDW: 14.8 % — AB (ref 11.0–14.6)
WBC: 5.5 10*3/uL (ref 4.0–10.3)

## 2017-08-11 LAB — LACTATE DEHYDROGENASE: LDH: 246 U/L — AB (ref 125–245)

## 2017-08-11 MED ORDER — ECULIZUMAB 300 MG/30ML IV SOLN
900.0000 mg | Freq: Once | INTRAVENOUS | Status: AC
  Administered 2017-08-11: 900 mg via INTRAVENOUS
  Filled 2017-08-11: qty 90

## 2017-08-11 MED ORDER — SODIUM CHLORIDE 0.9 % IV SOLN
Freq: Once | INTRAVENOUS | Status: AC
  Administered 2017-08-11: 14:00:00 via INTRAVENOUS

## 2017-08-11 NOTE — Progress Notes (Signed)
Per MD Julien Nordmann ok to treat today with AST 69, ALT, 106, and total bilirubin 2.5.

## 2017-08-11 NOTE — Patient Instructions (Signed)
[facility] Discharge Instructions for Patients Receiving Chemotherapy  Today you received the following chemotherapy agent: Soliris  To help prevent nausea and vomiting after your treatment, we encourage you to take your nausea medication as directed.   If you develop nausea and vomiting that is not controlled by your nausea medication, call the clinic.   BELOW ARE SYMPTOMS THAT SHOULD BE REPORTED IMMEDIATELY:  *FEVER GREATER THAN 100.5 F  *CHILLS WITH OR WITHOUT FEVER  NAUSEA AND VOMITING THAT IS NOT CONTROLLED WITH YOUR NAUSEA MEDICATION  *UNUSUAL SHORTNESS OF BREATH  *UNUSUAL BRUISING OR BLEEDING  TENDERNESS IN MOUTH AND THROAT WITH OR WITHOUT PRESENCE OF ULCERS  *URINARY PROBLEMS  *BOWEL PROBLEMS  UNUSUAL RASH Items with * indicate a potential emergency and should be followed up as soon as possible.  Feel free to call the clinic should you have any questions or concerns. The clinic phone number is [phone].  Please show the [place] at check-in to the Emergency Department and triage nurse.

## 2017-08-11 NOTE — Progress Notes (Signed)
Pt refused to stay for 1 hour post admin period.  VS stable.  A&Ox4.  Ambulatory w/steady gait.

## 2017-08-25 ENCOUNTER — Encounter: Payer: Self-pay | Admitting: Oncology

## 2017-08-25 ENCOUNTER — Telehealth: Payer: Self-pay | Admitting: Oncology

## 2017-08-25 ENCOUNTER — Inpatient Hospital Stay (HOSPITAL_BASED_OUTPATIENT_CLINIC_OR_DEPARTMENT_OTHER): Payer: BLUE CROSS/BLUE SHIELD | Admitting: Oncology

## 2017-08-25 ENCOUNTER — Inpatient Hospital Stay: Payer: BLUE CROSS/BLUE SHIELD

## 2017-08-25 VITALS — BP 129/73 | HR 77 | Temp 97.6°F | Resp 13 | Ht 66.0 in | Wt 170.3 lb

## 2017-08-25 VITALS — BP 117/78 | HR 77

## 2017-08-25 DIAGNOSIS — D595 Paroxysmal nocturnal hemoglobinuria [Marchiafava-Micheli]: Secondary | ICD-10-CM

## 2017-08-25 DIAGNOSIS — Z5111 Encounter for antineoplastic chemotherapy: Secondary | ICD-10-CM

## 2017-08-25 DIAGNOSIS — Z5112 Encounter for antineoplastic immunotherapy: Secondary | ICD-10-CM | POA: Diagnosis not present

## 2017-08-25 LAB — CBC WITH DIFFERENTIAL/PLATELET
BASOS ABS: 0 10*3/uL (ref 0.0–0.1)
BASOS PCT: 0 %
EOS ABS: 0.2 10*3/uL (ref 0.0–0.5)
EOS PCT: 5 %
HCT: 37.5 % — ABNORMAL LOW (ref 38.4–49.9)
HEMOGLOBIN: 12.9 g/dL — AB (ref 13.0–17.1)
Lymphocytes Relative: 23 %
Lymphs Abs: 1 10*3/uL (ref 0.9–3.3)
MCH: 39 pg — ABNORMAL HIGH (ref 27.2–33.4)
MCHC: 34.4 g/dL (ref 32.0–36.0)
MCV: 113.3 fL — ABNORMAL HIGH (ref 79.3–98.0)
Monocytes Absolute: 0.5 10*3/uL (ref 0.1–0.9)
Monocytes Relative: 12 %
NEUTROS PCT: 60 %
Neutro Abs: 2.8 10*3/uL (ref 1.5–6.5)
PLATELETS: 130 10*3/uL — AB (ref 140–400)
RBC: 3.31 MIL/uL — AB (ref 4.20–5.82)
RDW: 14.4 % (ref 11.0–14.6)
WBC: 4.6 10*3/uL (ref 4.0–10.3)

## 2017-08-25 LAB — COMPREHENSIVE METABOLIC PANEL
ALT: 80 U/L — AB (ref 0–55)
AST: 55 U/L — ABNORMAL HIGH (ref 5–34)
Albumin: 4.2 g/dL (ref 3.5–5.0)
Alkaline Phosphatase: 58 U/L (ref 40–150)
Anion gap: 9 (ref 3–11)
BILIRUBIN TOTAL: 2.7 mg/dL — AB (ref 0.2–1.2)
BUN: 14 mg/dL (ref 7–26)
CALCIUM: 9.3 mg/dL (ref 8.4–10.4)
CHLORIDE: 104 mmol/L (ref 98–109)
CO2: 24 mmol/L (ref 22–29)
CREATININE: 0.89 mg/dL (ref 0.70–1.30)
Glucose, Bld: 106 mg/dL (ref 70–140)
Potassium: 4.1 mmol/L (ref 3.5–5.1)
Sodium: 137 mmol/L (ref 136–145)
TOTAL PROTEIN: 6.8 g/dL (ref 6.4–8.3)

## 2017-08-25 LAB — LACTATE DEHYDROGENASE: LDH: 240 U/L (ref 125–245)

## 2017-08-25 MED ORDER — SODIUM CHLORIDE 0.9 % IV SOLN
900.0000 mg | Freq: Once | INTRAVENOUS | Status: AC
  Administered 2017-08-25: 900 mg via INTRAVENOUS
  Filled 2017-08-25: qty 90

## 2017-08-25 MED ORDER — SODIUM CHLORIDE 0.9 % IV SOLN
Freq: Once | INTRAVENOUS | Status: AC
  Administered 2017-08-25: 14:00:00 via INTRAVENOUS

## 2017-08-25 NOTE — Assessment & Plan Note (Signed)
This is a very pleasant 47 year old white male with paroxysmal nocturnal hemoglobinuria and currently on treatment with Soliris on days 1 and 15 every 4 weeks status post 36cycles. He tolerated the last cycle of his treatment well.He continues to have hyperbilirubinemia likely secondary to his disease.  His CBC and liver enzymes remain stable.  Bilirubin remains elevated at 2.7 which is stable for the patient.  Okay to treat with an elevated bilirubin.  LDH is pending.  Recommend that he proceed with day 1 of cycle #37 today as scheduled.  He will continue to have his labs and Soliris every 2 weeks.   Follow-up visit will be in4 weeks for evaluation before startinghis next cycle of treatment.  He was advised to call immediately if he has any concerning symptoms in the interval.  The patient voices understanding of current disease status and treatment options and is in agreement with the current care plan. All questions were answered. The patient knows to call the clinic with any problems, questions or concerns. We can certainly see the patient much sooner if necessary.

## 2017-08-25 NOTE — Patient Instructions (Signed)
[facility] Discharge Instructions for Patients Receiving Chemotherapy  Today you received the following chemotherapy agents Soliris  To help prevent nausea and vomiting after your treatment, we encourage you to take your nausea medication as directed If you develop nausea and vomiting that is not controlled by your nausea medication, call the clinic.   BELOW ARE SYMPTOMS THAT SHOULD BE REPORTED IMMEDIATELY:  *FEVER GREATER THAN 100.5 F  *CHILLS WITH OR WITHOUT FEVER  NAUSEA AND VOMITING THAT IS NOT CONTROLLED WITH YOUR NAUSEA MEDICATION  *UNUSUAL SHORTNESS OF BREATH  *UNUSUAL BRUISING OR BLEEDING  TENDERNESS IN MOUTH AND THROAT WITH OR WITHOUT PRESENCE OF ULCERS  *URINARY PROBLEMS  *BOWEL PROBLEMS  UNUSUAL RASH Items with * indicate a potential emergency and should be followed up as soon as possible.  Feel free to call the clinic should you have any questions or concerns. The clinic phone number is [phone].  Please show the [facility] at check-in to the Emergency Department and triage nurse.

## 2017-08-25 NOTE — Progress Notes (Signed)
Per Mikey Bussing NP, okay to treat pt with abnormal labs and total bili of 2.7

## 2017-08-25 NOTE — Progress Notes (Signed)
[facility] OFFICE PROGRESS NOTE  Travis Reach, MD [address] 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  PRIOR THERAPY: The patient was previously treated with a tapering dose of prednisone and high-dose IVIG for the hemolytic anemia before his diagnosis of paroxysmal nocturnal hemoglobinuria.  CURRENT THERAPY: Soliris (Eculizumab) initially with induction 600 MG IV weekly for 4 weeks and currently on maintenance treatment 900 MG IV every 2 weeks. First dose of treatment was given on 11/21/2014. Status post 36cycles.  INTERVAL HISTORY: Travis Mcdaniel 47 y.o. male returns for routine follow-up visit by himself.  The patient is feeling fine today has no specific complaints.  The patient denies fevers and chills.  Denies chest pain, shortness breath, cough, hemoptysis.  Denies nausea, vomiting, constipation, diarrhea.  Denies bleeding.  Denies recent weight loss or night sweats.  The patient is here for evaluation prior to starting cycle #37 of his treatment.  MEDICAL HISTORY: Past Medical History:  Diagnosis Date  . Diverticulosis   . Encounter for antineoplastic chemotherapy 01/15/2016  . Hypertension     ALLERGIES:  has No Known Allergies.  MEDICATIONS:  Current Outpatient Medications  Medication Sig Dispense Refill  . ALPRAZolam (XANAX) 1 MG tablet Take 1 mg by mouth 3 (three) times daily.  2  . aspirin EC 81 MG tablet Take 81 mg by mouth.    [name] Kitchen lisinopril-hydrochlorothiazide (PRINZIDE,ZESTORETIC) 20-25 MG tablet Take 1 tablet by mouth daily.  1  . Multiple Vitamin (MULTIVITAMIN WITH MINERALS) TABS tablet Take 1 tablet by mouth daily.    . Psyllium (METAMUCIL PO) Take 1 capsule by mouth daily.    . traMADol (ULTRAM) 50 MG tablet Take 1 tablet (50 mg total) 2 (two) times daily as needed by mouth. 30 tablet 0   No current facility-administered medications for this  visit.     SURGICAL HISTORY: History reviewed. No pertinent surgical history.  REVIEW OF SYSTEMS:   Review of Systems  Constitutional: Negative for appetite change, chills, fatigue, fever and unexpected weight change.  HENT:   Negative for mouth sores, nosebleeds, sore throat and trouble swallowing.   Eyes: Negative for eye problems and icterus.  Respiratory: Negative for cough, hemoptysis, shortness of breath and wheezing.   Cardiovascular: Negative for chest pain and leg swelling.  Gastrointestinal: Negative for abdominal pain, constipation, diarrhea, nausea and vomiting.  Genitourinary: Negative for bladder incontinence, difficulty urinating, dysuria, frequency and hematuria.   Musculoskeletal: Negative for back pain, gait problem, neck pain and neck stiffness.  Skin: Negative for itching and rash.  Neurological: Negative for dizziness, extremity weakness, gait problem, headaches, light-headedness and seizures.  Hematological: Negative for adenopathy. Does not bruise/bleed easily.  Psychiatric/Behavioral: Negative for confusion, depression and sleep disturbance. The patient is not nervous/anxious.     PHYSICAL EXAMINATION:  Blood pressure 129/73, pulse 77, temperature 97.6 F (36.4 C), temperature source Oral, resp. rate 13, height 5\' 6"  (1.676 m), weight 170 lb 5 oz (77.3 kg), SpO2 100 %.  ECOG PERFORMANCE STATUS: 1 - Symptomatic but completely ambulatory  Physical Exam  Constitutional: Oriented to person, place, and time and well-developed, well-nourished, and in no distress. No distress.  HENT:  Head: Normocephalic and atraumatic.  Mouth/Throat: Oropharynx is clear and moist. No oropharyngeal exudate.  Eyes: Conjunctivae are normal. Right eye exhibits no discharge. Left eye exhibits no discharge. No scleral icterus.  Neck: Normal range of motion. Neck supple.  Cardiovascular: Normal rate, regular  rhythm, normal heart sounds and intact distal pulses.   Pulmonary/Chest: Effort  normal and breath sounds normal. No respiratory distress. No wheezes. No rales.  Abdominal: Soft. Bowel sounds are normal. Exhibits no distension and no mass. There is no tenderness.  Musculoskeletal: Normal range of motion. Exhibits no edema.  Lymphadenopathy:    No cervical adenopathy.  Neurological: Alert and oriented to person, place, and time. Exhibits normal muscle tone. Gait normal. Coordination normal.  Skin: Skin is warm and dry. No rash noted. Not diaphoretic. No erythema. No pallor.  Psychiatric: Mood, memory and judgment normal.  Vitals reviewed.  LABORATORY DATA: Lab Results  Component Value Date   WBC 4.6 08/25/2017   HGB 12.9 (L) 08/25/2017   HCT 37.5 (L) 08/25/2017   MCV 113.3 (H) 08/25/2017   PLT 130 (L) 08/25/2017      Chemistry      Component Value Date/Time   NA 137 08/25/2017 1241   NA 136 06/28/2017 1236   K 4.1 08/25/2017 1241   K 4.1 06/28/2017 1236   CL 104 08/25/2017 1241   CO2 24 08/25/2017 1241   CO2 24 06/28/2017 1236   BUN 14 08/25/2017 1241   BUN 17.1 06/28/2017 1236   CREATININE 0.89 08/25/2017 1241   CREATININE 1.0 06/28/2017 1236      Component Value Date/Time   CALCIUM 9.3 08/25/2017 1241   CALCIUM 9.1 06/28/2017 1236   ALKPHOS 58 08/25/2017 1241   ALKPHOS 66 06/28/2017 1236   AST 55 (H) 08/25/2017 1241   AST 40 (H) 06/28/2017 1236   ALT 80 (H) 08/25/2017 1241   ALT 60 (H) 06/28/2017 1236   BILITOT 2.7 (H) 08/25/2017 1241   BILITOT 2.87 (H) 06/28/2017 1236       RADIOGRAPHIC STUDIES:  No results found.   ASSESSMENT/PLAN:  Paroxysmal nocturnal hemoglobinuria (PNH) (HCC) This is a very pleasant 47 year old white male with paroxysmal nocturnal hemoglobinuria and currently on treatment with Soliris on days 1 and 15 every 4 weeks status post 36cycles. He tolerated the last cycle of his treatment well.He continues to have hyperbilirubinemia likely secondary to his disease.  His CBC and liver enzymes remain stable.  Bilirubin  remains elevated at 2.7 which is stable for the patient.  Okay to treat with an elevated bilirubin.  LDH is pending.  Recommend that he proceed with day 1 of cycle #37 today as scheduled.  He will continue to have his labs and Soliris every 2 weeks.   Follow-up visit will be in4 weeks for evaluation before startinghis next cycle of treatment.  He was advised to call immediately if he has any concerning symptoms in the interval.  The patient voices understanding of current disease status and treatment options and is in agreement with the current care plan. All questions were answered. The patient knows to call the clinic with any problems, questions or concerns. We can certainly see the patient much sooner if necessary.  No orders of the defined types were placed in this encounter.  Mikey Bussing, DNP, AGPCNP-BC, AOCNP 08/25/17

## 2017-08-25 NOTE — Telephone Encounter (Signed)
Scheduled appt per 2/27 los - patient to get an updated schedule next visit.

## 2017-08-25 NOTE — Progress Notes (Signed)
Pt didn't stay for monitoring period after admin.  VS stable.

## 2017-09-08 ENCOUNTER — Inpatient Hospital Stay: Payer: BLUE CROSS/BLUE SHIELD | Attending: Internal Medicine

## 2017-09-08 ENCOUNTER — Inpatient Hospital Stay: Payer: BLUE CROSS/BLUE SHIELD

## 2017-09-08 VITALS — BP 116/67 | HR 84 | Temp 98.3°F | Resp 17

## 2017-09-08 DIAGNOSIS — Z5112 Encounter for antineoplastic immunotherapy: Secondary | ICD-10-CM | POA: Insufficient documentation

## 2017-09-08 DIAGNOSIS — D595 Paroxysmal nocturnal hemoglobinuria [Marchiafava-Micheli]: Secondary | ICD-10-CM

## 2017-09-08 LAB — COMPREHENSIVE METABOLIC PANEL
ALK PHOS: 58 U/L (ref 40–150)
ALT: 79 U/L — ABNORMAL HIGH (ref 0–55)
ANION GAP: 10 (ref 3–11)
AST: 49 U/L — ABNORMAL HIGH (ref 5–34)
Albumin: 4.2 g/dL (ref 3.5–5.0)
BILIRUBIN TOTAL: 2.8 mg/dL — AB (ref 0.2–1.2)
BUN: 18 mg/dL (ref 7–26)
CALCIUM: 9.3 mg/dL (ref 8.4–10.4)
CO2: 23 mmol/L (ref 22–29)
Chloride: 103 mmol/L (ref 98–109)
Creatinine, Ser: 0.96 mg/dL (ref 0.70–1.30)
GFR calc non Af Amer: >60 mL/min (ref >60)
Glucose, Bld: 113 mg/dL (ref 70–140)
POTASSIUM: 3.9 mmol/L (ref 3.5–5.1)
SODIUM: 136 mmol/L (ref 136–145)
TOTAL PROTEIN: 6.9 g/dL (ref 6.4–8.3)

## 2017-09-08 LAB — CBC WITH DIFFERENTIAL/PLATELET
BASOS ABS: 0 10*3/uL (ref 0.0–0.1)
Basophils Relative: 1 %
Eosinophils Absolute: 0.2 10*3/uL (ref 0.0–0.5)
Eosinophils Relative: 3 %
HEMATOCRIT: 36 % — AB (ref 38.4–49.9)
HEMOGLOBIN: 12.5 g/dL — AB (ref 13.0–17.1)
LYMPHS PCT: 26 %
Lymphs Abs: 1.4 10*3/uL (ref 0.9–3.3)
MCH: 39.6 pg — ABNORMAL HIGH (ref 27.2–33.4)
MCHC: 34.7 g/dL (ref 32.0–36.0)
MCV: 113.9 fL — ABNORMAL HIGH (ref 79.3–98.0)
Monocytes Absolute: 0.5 10*3/uL (ref 0.1–0.9)
Monocytes Relative: 9 %
NEUTROS ABS: 3.2 10*3/uL (ref 1.5–6.5)
NEUTROS PCT: 61 %
Platelets: 136 10*3/uL — ABNORMAL LOW (ref 140–400)
RBC: 3.16 MIL/uL — AB (ref 4.20–5.82)
RDW: 14.8 % — ABNORMAL HIGH (ref 11.0–14.6)
WBC: 5.2 10*3/uL (ref 4.0–10.3)

## 2017-09-08 LAB — LACTATE DEHYDROGENASE: LDH: 243 U/L (ref 125–245)

## 2017-09-08 MED ORDER — SODIUM CHLORIDE 0.9 % IV SOLN
900.0000 mg | Freq: Once | INTRAVENOUS | Status: AC
  Administered 2017-09-08: 900 mg via INTRAVENOUS
  Filled 2017-09-08: qty 90

## 2017-09-08 MED ORDER — SODIUM CHLORIDE 0.9 % IV SOLN
Freq: Once | INTRAVENOUS | Status: AC
  Administered 2017-09-08: 14:00:00 via INTRAVENOUS

## 2017-09-08 NOTE — Patient Instructions (Signed)
[facility] Discharge Instructions for Patients Receiving Chemotherapy  Today you received the following chemotherapy agents Soliris  To help prevent nausea and vomiting after your treatment, we encourage you to take your nausea medication as directed If you develop nausea and vomiting that is not controlled by your nausea medication, call the clinic.   BELOW ARE SYMPTOMS THAT SHOULD BE REPORTED IMMEDIATELY:  *FEVER GREATER THAN 100.5 F  *CHILLS WITH OR WITHOUT FEVER  NAUSEA AND VOMITING THAT IS NOT CONTROLLED WITH YOUR NAUSEA MEDICATION  *UNUSUAL SHORTNESS OF BREATH  *UNUSUAL BRUISING OR BLEEDING  TENDERNESS IN MOUTH AND THROAT WITH OR WITHOUT PRESENCE OF ULCERS  *URINARY PROBLEMS  *BOWEL PROBLEMS  UNUSUAL RASH Items with * indicate a potential emergency and should be followed up as soon as possible.  Feel free to call the clinic should you have any questions or concerns. The clinic phone number is [phone].  Please show the [place] at check-in to the Emergency Department and triage nurse.

## 2017-09-08 NOTE — Progress Notes (Signed)
Pt chose not to stay for one hour post solaris observation period. Pt verbalized understanding of risks. Pt left ambulatory in no distress.

## 2017-09-22 ENCOUNTER — Inpatient Hospital Stay: Payer: BLUE CROSS/BLUE SHIELD

## 2017-09-22 ENCOUNTER — Encounter: Payer: Self-pay | Admitting: Oncology

## 2017-09-22 ENCOUNTER — Telehealth: Payer: Self-pay | Admitting: Oncology

## 2017-09-22 ENCOUNTER — Inpatient Hospital Stay (HOSPITAL_BASED_OUTPATIENT_CLINIC_OR_DEPARTMENT_OTHER): Payer: BLUE CROSS/BLUE SHIELD | Admitting: Oncology

## 2017-09-22 VITALS — BP 108/58 | HR 72 | Temp 98.2°F | Resp 18 | Ht 66.0 in | Wt 167.9 lb

## 2017-09-22 VITALS — BP 105/68 | HR 80 | Resp 18

## 2017-09-22 DIAGNOSIS — D595 Paroxysmal nocturnal hemoglobinuria [Marchiafava-Micheli]: Secondary | ICD-10-CM | POA: Diagnosis not present

## 2017-09-22 DIAGNOSIS — Z5111 Encounter for antineoplastic chemotherapy: Secondary | ICD-10-CM

## 2017-09-22 DIAGNOSIS — Z5112 Encounter for antineoplastic immunotherapy: Secondary | ICD-10-CM | POA: Diagnosis not present

## 2017-09-22 LAB — CMP (CANCER CENTER ONLY)
ALBUMIN: 4.5 g/dL (ref 3.5–5.0)
ALK PHOS: 58 U/L (ref 40–150)
ALT: 64 U/L — ABNORMAL HIGH (ref 0–55)
AST: 44 U/L — AB (ref 5–34)
Anion gap: 10 (ref 3–11)
BILIRUBIN TOTAL: 2.6 mg/dL — AB (ref 0.2–1.2)
BUN: 12 mg/dL (ref 7–26)
CALCIUM: 9.5 mg/dL (ref 8.4–10.4)
CO2: 23 mmol/L (ref 22–29)
Chloride: 103 mmol/L (ref 98–109)
Creatinine: 0.88 mg/dL (ref 0.70–1.30)
GFR, Est AFR Am: >60 mL/min (ref >60)
GFR, Estimated: >60 mL/min (ref >60)
GLUCOSE: 90 mg/dL (ref 70–140)
Potassium: 4.1 mmol/L (ref 3.5–5.1)
Sodium: 136 mmol/L (ref 136–145)
TOTAL PROTEIN: 7.2 g/dL (ref 6.4–8.3)

## 2017-09-22 LAB — CBC WITH DIFFERENTIAL (CANCER CENTER ONLY)
BASOS ABS: 0 10*3/uL (ref 0.0–0.1)
BASOS PCT: 1 %
Eosinophils Absolute: 0.1 10*3/uL (ref 0.0–0.5)
Eosinophils Relative: 3 %
HCT: 39 % (ref 38.4–49.9)
Hemoglobin: 13.1 g/dL (ref 13.0–17.1)
LYMPHS PCT: 23 %
Lymphs Abs: 0.9 10*3/uL (ref 0.9–3.3)
MCH: 37.9 pg — ABNORMAL HIGH (ref 27.2–33.4)
MCHC: 33.7 g/dL (ref 32.0–36.0)
MCV: 112.3 fL — AB (ref 79.3–98.0)
MONO ABS: 0.3 10*3/uL (ref 0.1–0.9)
Monocytes Relative: 9 %
Neutro Abs: 2.5 10*3/uL (ref 1.5–6.5)
Neutrophils Relative %: 64 %
Platelet Count: 143 10*3/uL (ref 140–400)
RBC: 3.47 MIL/uL — ABNORMAL LOW (ref 4.20–5.82)
RDW: 13.9 % (ref 11.0–14.6)
WBC Count: 3.9 10*3/uL — ABNORMAL LOW (ref 4.0–10.3)

## 2017-09-22 LAB — LACTATE DEHYDROGENASE: LDH: 230 U/L (ref 125–245)

## 2017-09-22 MED ORDER — SODIUM CHLORIDE 0.9 % IV SOLN
Freq: Once | INTRAVENOUS | Status: AC
  Administered 2017-09-22: 15:00:00 via INTRAVENOUS

## 2017-09-22 MED ORDER — SODIUM CHLORIDE 0.9 % IV SOLN
900.0000 mg | Freq: Once | INTRAVENOUS | Status: AC
  Administered 2017-09-22: 900 mg via INTRAVENOUS
  Filled 2017-09-22: qty 90

## 2017-09-22 NOTE — Progress Notes (Signed)
[facility] OFFICE PROGRESS NOTE  Travis Reach, MD [address] 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  PRIOR THERAPY: The patient was previously treated with a tapering dose of prednisone and high-dose IVIG for the hemolytic anemia before his diagnosis of paroxysmal nocturnal hemoglobinuria.  CURRENT THERAPY: Soliris (Eculizumab) initially with induction 600 MG IV weekly for 4 weeks and currently on maintenance treatment 900 MG IV every 2 weeks. First dose of treatment was given on 11/21/2014. Status post 37cycles.  INTERVAL HISTORY: Travis Mcdaniel 47 y.o. male returns for routine follow-up visit by himself.  The patient is feeling fine today and has no specific complaints.  He denies fevers and chills.  Denies chest pain, shortness breath, cough, hemoptysis.  Denies nausea, vomiting, constipation, diarrhea.  Denies bleeding.  Denies recent weight loss or night sweats.  The patient is here for evaluation prior to starting cycle #38 of his treatment.  MEDICAL HISTORY: Past Medical History:  Diagnosis Date  . Diverticulosis   . Encounter for antineoplastic chemotherapy 01/15/2016  . Hypertension     ALLERGIES:  has No Known Allergies.  MEDICATIONS:  Current Outpatient Medications  Medication Sig Dispense Refill  . ALPRAZolam (XANAX) 1 MG tablet Take 1 mg by mouth 3 (three) times daily.  2  . aspirin EC 81 MG tablet Take 81 mg by mouth.    [name] Kitchen lisinopril-hydrochlorothiazide (PRINZIDE,ZESTORETIC) 20-25 MG tablet Take 1 tablet by mouth daily.  1  . Multiple Vitamin (MULTIVITAMIN WITH MINERALS) TABS tablet Take 1 tablet by mouth daily.    . Psyllium (METAMUCIL PO) Take 1 capsule by mouth daily.    . traMADol (ULTRAM) 50 MG tablet Take 1 tablet (50 mg total) 2 (two) times daily as needed by mouth. 30 tablet 0   No current facility-administered medications for this  visit.     SURGICAL HISTORY: History reviewed. No pertinent surgical history.  REVIEW OF SYSTEMS:   Review of Systems  Constitutional: Negative for appetite change, chills, fatigue, fever and unexpected weight change.  HENT:   Negative for mouth sores, nosebleeds, sore throat and trouble swallowing.   Eyes: Negative for eye problems and icterus.  Respiratory: Negative for cough, hemoptysis, shortness of breath and wheezing.   Cardiovascular: Negative for chest pain and leg swelling.  Gastrointestinal: Negative for abdominal pain, constipation, diarrhea, nausea and vomiting.  Genitourinary: Negative for bladder incontinence, difficulty urinating, dysuria, frequency and hematuria.   Musculoskeletal: Negative for back pain, gait problem, neck pain and neck stiffness.  Skin: Negative for itching and rash.  Neurological: Negative for dizziness, extremity weakness, gait problem, headaches, light-headedness and seizures.  Hematological: Negative for adenopathy. Does not bruise/bleed easily.  Psychiatric/Behavioral: Negative for confusion, depression and sleep disturbance. The patient is not nervous/anxious.     PHYSICAL EXAMINATION:  Blood pressure (!) 108/58, pulse 72, temperature 98.2 F (36.8 C), temperature source Oral, resp. rate 18, height 5\' 6"  (1.676 m), weight 167 lb 14.4 oz (76.2 kg), SpO2 100 %.  ECOG PERFORMANCE STATUS: 1 - Symptomatic but completely ambulatory  Physical Exam  Constitutional: Oriented to person, place, and time and well-developed, well-nourished, and in no distress. No distress.  HENT:  Head: Normocephalic and atraumatic.  Mouth/Throat: Oropharynx is clear and moist. No oropharyngeal exudate.  Eyes: Conjunctivae are normal. Right eye exhibits no discharge. Left eye exhibits no discharge. No scleral icterus.  Neck: Normal range of motion. Neck supple.  Cardiovascular: Normal rate,  regular rhythm, normal heart sounds and intact distal pulses.   Pulmonary/Chest:  Effort normal and breath sounds normal. No respiratory distress. No wheezes. No rales.  Abdominal: Soft. Bowel sounds are normal. Exhibits no distension and no mass. There is no tenderness.  Musculoskeletal: Normal range of motion. Exhibits no edema.  Lymphadenopathy:    No cervical adenopathy.  Neurological: Alert and oriented to person, place, and time. Exhibits normal muscle tone. Gait normal. Coordination normal.  Skin: Skin is warm and dry. No rash noted. Not diaphoretic. No erythema. No pallor.  Psychiatric: Mood, memory and judgment normal.  Vitals reviewed.  LABORATORY DATA: Lab Results  Component Value Date   WBC 3.9 (L) 09/22/2017   HGB 12.5 (L) 09/08/2017   HCT 39.0 09/22/2017   MCV 112.3 (H) 09/22/2017   PLT 143 09/22/2017      Chemistry      Component Value Date/Time   NA 136 09/22/2017 1241   NA 136 06/28/2017 1236   K 4.1 09/22/2017 1241   K 4.1 06/28/2017 1236   CL 103 09/22/2017 1241   CO2 23 09/22/2017 1241   CO2 24 06/28/2017 1236   BUN 12 09/22/2017 1241   BUN 17.1 06/28/2017 1236   CREATININE 0.88 09/22/2017 1241   CREATININE 1.0 06/28/2017 1236      Component Value Date/Time   CALCIUM 9.5 09/22/2017 1241   CALCIUM 9.1 06/28/2017 1236   ALKPHOS 58 09/22/2017 1241   ALKPHOS 66 06/28/2017 1236   AST 44 (H) 09/22/2017 1241   AST 40 (H) 06/28/2017 1236   ALT 64 (H) 09/22/2017 1241   ALT 60 (H) 06/28/2017 1236   BILITOT 2.6 (H) 09/22/2017 1241   BILITOT 2.87 (H) 06/28/2017 1236       RADIOGRAPHIC STUDIES:  No results found.   ASSESSMENT/PLAN:  Paroxysmal nocturnal hemoglobinuria (PNH) (HCC) This is a very pleasant 47 year old white male with paroxysmal nocturnal hemoglobinuria and currently on treatment with Soliris on days 1 and 15 every 4 weeks status post 37cycles. He tolerated the last cycle of his treatment well.He continues to have hyperbilirubinemia likely secondary to his disease.  His CBC and liver enzymes remain stable.   Bilirubin remains elevated at 2.6 which is stable for the patient.  Okay to treat with an elevated bilirubin.  LDH is pending. Recommend that he proceed with day 1 of cycle #38 today as scheduled.  He will continue to have his labs and Soliris every 2 weeks.  Follow-up visit will be in4 weeks for evaluation before startinghis next cycle of treatment.  He was advised to call immediately if he has any concerning symptoms in the interval.  The patient voices understanding of current disease status and treatment options and is in agreement with the current care plan. All questions were answered. The patient knows to call the clinic with any problems, questions or concerns. We can certainly see the patient much sooner if necessary.   No orders of the defined types were placed in this encounter.  Mikey Bussing, DNP, AGPCNP-BC, AOCNP 09/22/17

## 2017-09-22 NOTE — Progress Notes (Signed)
Patient declined to stay for 1 hour post infusion observation. Vital signs taken and stable. Patient stated that he never remains for observation. Patient states he feels good. Patient ambulatory out of infusion suite.

## 2017-09-22 NOTE — Patient Instructions (Signed)
[facility] Discharge Instructions for Patients Receiving Chemotherapy  Today you received the following chemotherapy agents Soliris.   To help prevent nausea and vomiting after your treatment, we encourage you to take your nausea medication as directed.  If you develop nausea and vomiting that is not controlled by your nausea medication, call the clinic.   BELOW ARE SYMPTOMS THAT SHOULD BE REPORTED IMMEDIATELY:  *FEVER GREATER THAN 100.5 F  *CHILLS WITH OR WITHOUT FEVER  NAUSEA AND VOMITING THAT IS NOT CONTROLLED WITH YOUR NAUSEA MEDICATION  *UNUSUAL SHORTNESS OF BREATH  *UNUSUAL BRUISING OR BLEEDING  TENDERNESS IN MOUTH AND THROAT WITH OR WITHOUT PRESENCE OF ULCERS  *URINARY PROBLEMS  *BOWEL PROBLEMS  UNUSUAL RASH Items with * indicate a potential emergency and should be followed up as soon as possible.  Feel free to call the clinic should you have any questions or concerns. The clinic phone number is [phone].  Please show the [place] at check-in to the Emergency Department and triage nurse.

## 2017-09-22 NOTE — Assessment & Plan Note (Signed)
This is a very pleasant 47 year old white male with paroxysmal nocturnal hemoglobinuria and currently on treatment with Soliris on days 1 and 15 every 4 weeks status post 37cycles. He tolerated the last cycle of his treatment well.He continues to have hyperbilirubinemia likely secondary to his disease.  His CBC and liver enzymes remain stable.  Bilirubin remains elevated at 2.6 which is stable for the patient.  Okay to treat with an elevated bilirubin.  LDH is pending. Recommend that he proceed with day 1 of cycle #38 today as scheduled.  He will continue to have his labs and Soliris every 2 weeks.  Follow-up visit will be in4 weeks for evaluation before startinghis next cycle of treatment.  He was advised to call immediately if he has any concerning symptoms in the interval.  The patient voices understanding of current disease status and treatment options and is in agreement with the current care plan. All questions were answered. The patient knows to call the clinic with any problems, questions or concerns. We can certainly see the patient much sooner if necessary.

## 2017-09-22 NOTE — Telephone Encounter (Signed)
Scheduled appt per 3/27 los- Gave patient AVS and calender per los.  

## 2017-09-22 NOTE — Progress Notes (Signed)
Per Erasmo Downer Curcio,NP ok to treat with elevated bilirubin.

## 2017-10-06 ENCOUNTER — Other Ambulatory Visit: Payer: BLUE CROSS/BLUE SHIELD

## 2017-10-06 ENCOUNTER — Ambulatory Visit: Payer: BLUE CROSS/BLUE SHIELD

## 2017-10-08 ENCOUNTER — Inpatient Hospital Stay: Payer: BLUE CROSS/BLUE SHIELD | Attending: Internal Medicine

## 2017-10-08 ENCOUNTER — Inpatient Hospital Stay: Payer: BLUE CROSS/BLUE SHIELD

## 2017-10-08 ENCOUNTER — Encounter: Payer: Self-pay | Admitting: Internal Medicine

## 2017-10-08 VITALS — BP 134/78 | HR 72 | Temp 98.4°F | Resp 16 | Ht 66.0 in | Wt 166.0 lb

## 2017-10-08 DIAGNOSIS — Z5112 Encounter for antineoplastic immunotherapy: Secondary | ICD-10-CM | POA: Diagnosis present

## 2017-10-08 DIAGNOSIS — D595 Paroxysmal nocturnal hemoglobinuria [Marchiafava-Micheli]: Secondary | ICD-10-CM | POA: Diagnosis present

## 2017-10-08 LAB — COMPREHENSIVE METABOLIC PANEL
ALT: 52 U/L (ref 0–55)
AST: 36 U/L — AB (ref 5–34)
Albumin: 4.1 g/dL (ref 3.5–5.0)
Alkaline Phosphatase: 57 U/L (ref 40–150)
Anion gap: 9 (ref 3–11)
BUN: 16 mg/dL (ref 7–26)
CHLORIDE: 105 mmol/L (ref 98–109)
CO2: 24 mmol/L (ref 22–29)
CREATININE: 0.88 mg/dL (ref 0.70–1.30)
Calcium: 9.3 mg/dL (ref 8.4–10.4)
GFR calc Af Amer: >60 mL/min (ref >60)
GFR calc non Af Amer: >60 mL/min (ref >60)
Glucose, Bld: 98 mg/dL (ref 70–140)
POTASSIUM: 3.9 mmol/L (ref 3.5–5.1)
SODIUM: 138 mmol/L (ref 136–145)
Total Bilirubin: 2.4 mg/dL — ABNORMAL HIGH (ref 0.2–1.2)
Total Protein: 6.7 g/dL (ref 6.4–8.3)

## 2017-10-08 LAB — CBC WITH DIFFERENTIAL/PLATELET
Basophils Absolute: 0 10*3/uL (ref 0.0–0.1)
Basophils Relative: 0 %
EOS PCT: 5 %
Eosinophils Absolute: 0.2 10*3/uL (ref 0.0–0.5)
HCT: 37.5 % — ABNORMAL LOW (ref 38.4–49.9)
HEMOGLOBIN: 12.7 g/dL — AB (ref 13.0–17.1)
LYMPHS ABS: 0.9 10*3/uL (ref 0.9–3.3)
LYMPHS PCT: 22 %
MCH: 37.8 pg — AB (ref 27.2–33.4)
MCHC: 33.9 g/dL (ref 32.0–36.0)
MCV: 111.6 fL — AB (ref 79.3–98.0)
MONOS PCT: 9 %
Monocytes Absolute: 0.4 10*3/uL (ref 0.1–0.9)
NEUTROS PCT: 64 %
Neutro Abs: 2.5 10*3/uL (ref 1.5–6.5)
Platelets: 124 10*3/uL — ABNORMAL LOW (ref 140–400)
RBC: 3.36 MIL/uL — AB (ref 4.20–5.82)
RDW: 14.1 % (ref 11.0–14.6)
WBC: 3.9 10*3/uL — ABNORMAL LOW (ref 4.0–10.3)

## 2017-10-08 LAB — LACTATE DEHYDROGENASE: LDH: 221 U/L (ref 125–245)

## 2017-10-08 MED ORDER — SODIUM CHLORIDE 0.9 % IV SOLN
Freq: Once | INTRAVENOUS | Status: AC
  Administered 2017-10-08: 09:00:00 via INTRAVENOUS

## 2017-10-08 MED ORDER — SODIUM CHLORIDE 0.9 % IV SOLN
900.0000 mg | Freq: Once | INTRAVENOUS | Status: AC
  Administered 2017-10-08: 900 mg via INTRAVENOUS
  Filled 2017-10-08: qty 90

## 2017-10-08 NOTE — Patient Instructions (Signed)
[facility] Discharge Instructions for Patients Receiving Chemotherapy  Today you received the following chemotherapy agents: Eculizumab (Solaris).  To help prevent nausea and vomiting after your treatment, we encourage you to take your nausea medication as prescribed.  If you develop nausea and vomiting that is not controlled by your nausea medication, call the clinic.   BELOW ARE SYMPTOMS THAT SHOULD BE REPORTED IMMEDIATELY:  *FEVER GREATER THAN 100.5 F  *CHILLS WITH OR WITHOUT FEVER  NAUSEA AND VOMITING THAT IS NOT CONTROLLED WITH YOUR NAUSEA MEDICATION  *UNUSUAL SHORTNESS OF BREATH  *UNUSUAL BRUISING OR BLEEDING  TENDERNESS IN MOUTH AND THROAT WITH OR WITHOUT PRESENCE OF ULCERS  *URINARY PROBLEMS  *BOWEL PROBLEMS  UNUSUAL RASH Items with * indicate a potential emergency and should be followed up as soon as possible.  Feel free to call the clinic should you have any questions or concerns. The clinic phone number is [phone].  Please show the [place] at check-in to the Emergency Department and triage nurse.

## 2017-10-08 NOTE — Patient Instructions (Signed)
[facility] Discharge Instructions for Patients Receiving Chemotherapy  Today you received the following chemotherapy agents: Eculizumab (Solaris).  To help prevent nausea and vomiting after your treatment, we encourage you to take your nausea medication as prescribed.  If you develop nausea and vomiting that is not controlled by your nausea medication, call the clinic.   BELOW ARE SYMPTOMS THAT SHOULD BE REPORTED IMMEDIATELY:  *FEVER GREATER THAN 100.5 F  *CHILLS WITH OR WITHOUT FEVER  NAUSEA AND VOMITING THAT IS NOT CONTROLLED WITH YOUR NAUSEA MEDICATION  *UNUSUAL SHORTNESS OF BREATH  *UNUSUAL BRUISING OR BLEEDING  TENDERNESS IN MOUTH AND THROAT WITH OR WITHOUT PRESENCE OF ULCERS  *URINARY PROBLEMS  *BOWEL PROBLEMS  UNUSUAL RASH Items with * indicate a potential emergency and should be followed up as soon as possible.  Feel free to call the clinic should you have any questions or concerns. The clinic phone number is [phone].  Please show the [facility] at check-in to the Emergency Department and triage nurse.

## 2017-10-08 NOTE — Progress Notes (Signed)
Patient came in to bring letter from the [employer] for copay assistance that he stated Lenise needed. Made copy of letter and advised I would leave for Lenise to review and she would contact him if anything else was needed. He verbalized understanding.

## 2017-10-08 NOTE — Progress Notes (Unsigned)
Pt declined to stay for 1-hr post observation period.

## 2017-10-11 ENCOUNTER — Encounter: Payer: Self-pay | Admitting: Internal Medicine

## 2017-10-11 NOTE — Progress Notes (Signed)
Pt applied to [employer] and was approved to help with medication copays, health ins premium reimbursements and incidental medical expenses through 06/28/18.

## 2017-10-20 ENCOUNTER — Ambulatory Visit: Payer: BLUE CROSS/BLUE SHIELD

## 2017-10-20 ENCOUNTER — Other Ambulatory Visit: Payer: BLUE CROSS/BLUE SHIELD

## 2017-10-20 ENCOUNTER — Ambulatory Visit: Payer: BLUE CROSS/BLUE SHIELD | Admitting: Oncology

## 2017-10-22 ENCOUNTER — Inpatient Hospital Stay (HOSPITAL_BASED_OUTPATIENT_CLINIC_OR_DEPARTMENT_OTHER): Payer: BLUE CROSS/BLUE SHIELD | Admitting: Oncology

## 2017-10-22 ENCOUNTER — Inpatient Hospital Stay: Payer: BLUE CROSS/BLUE SHIELD

## 2017-10-22 ENCOUNTER — Encounter: Payer: Self-pay | Admitting: Oncology

## 2017-10-22 ENCOUNTER — Telehealth: Payer: Self-pay | Admitting: Oncology

## 2017-10-22 VITALS — BP 115/76 | HR 68 | Temp 98.3°F | Resp 18 | Ht 66.0 in | Wt 164.8 lb

## 2017-10-22 DIAGNOSIS — D595 Paroxysmal nocturnal hemoglobinuria [Marchiafava-Micheli]: Secondary | ICD-10-CM | POA: Diagnosis not present

## 2017-10-22 DIAGNOSIS — Z5112 Encounter for antineoplastic immunotherapy: Secondary | ICD-10-CM | POA: Diagnosis not present

## 2017-10-22 DIAGNOSIS — Z5111 Encounter for antineoplastic chemotherapy: Secondary | ICD-10-CM

## 2017-10-22 LAB — COMPREHENSIVE METABOLIC PANEL
ALK PHOS: 60 U/L (ref 40–150)
ALT: 46 U/L (ref 0–55)
ANION GAP: 10 (ref 3–11)
AST: 36 U/L — ABNORMAL HIGH (ref 5–34)
Albumin: 4.4 g/dL (ref 3.5–5.0)
BUN: 14 mg/dL (ref 7–26)
CALCIUM: 9.4 mg/dL (ref 8.4–10.4)
CO2: 23 mmol/L (ref 22–29)
Chloride: 105 mmol/L (ref 98–109)
Creatinine, Ser: 0.94 mg/dL (ref 0.70–1.30)
GFR calc non Af Amer: >60 mL/min (ref >60)
Glucose, Bld: 101 mg/dL (ref 70–140)
Potassium: 3.9 mmol/L (ref 3.5–5.1)
SODIUM: 138 mmol/L (ref 136–145)
Total Bilirubin: 2.7 mg/dL — ABNORMAL HIGH (ref 0.2–1.2)
Total Protein: 7 g/dL (ref 6.4–8.3)

## 2017-10-22 LAB — CBC WITH DIFFERENTIAL/PLATELET
BASOS PCT: 1 %
Basophils Absolute: 0 10*3/uL (ref 0.0–0.1)
Eosinophils Absolute: 0.2 10*3/uL (ref 0.0–0.5)
Eosinophils Relative: 7 %
HEMATOCRIT: 36.5 % — AB (ref 38.4–49.9)
HEMOGLOBIN: 12.4 g/dL — AB (ref 13.0–17.1)
Lymphocytes Relative: 22 %
Lymphs Abs: 0.8 10*3/uL — ABNORMAL LOW (ref 0.9–3.3)
MCH: 38.1 pg — ABNORMAL HIGH (ref 27.2–33.4)
MCHC: 34.1 g/dL (ref 32.0–36.0)
MCV: 112 fL — ABNORMAL HIGH (ref 79.3–98.0)
MONOS PCT: 9 %
Monocytes Absolute: 0.3 10*3/uL (ref 0.1–0.9)
NEUTROS PCT: 61 %
Neutro Abs: 2.3 10*3/uL (ref 1.5–6.5)
Platelets: 149 10*3/uL (ref 140–400)
RBC: 3.26 MIL/uL — AB (ref 4.20–5.82)
RDW: 14.6 % (ref 11.0–14.6)
WBC: 3.7 10*3/uL — AB (ref 4.0–10.3)

## 2017-10-22 LAB — LACTATE DEHYDROGENASE: LDH: 226 U/L (ref 125–245)

## 2017-10-22 MED ORDER — SODIUM CHLORIDE 0.9 % IV SOLN
Freq: Once | INTRAVENOUS | Status: AC
  Administered 2017-10-22: 10:00:00 via INTRAVENOUS

## 2017-10-22 MED ORDER — SODIUM CHLORIDE 0.9 % IV SOLN
900.0000 mg | Freq: Once | INTRAVENOUS | Status: AC
  Administered 2017-10-22: 900 mg via INTRAVENOUS
  Filled 2017-10-22: qty 90

## 2017-10-22 NOTE — Progress Notes (Signed)
Per Mikey Bussing, NP: Treat despite bilirubin 2.7, this is stable for patient and part of his disease process.

## 2017-10-22 NOTE — Telephone Encounter (Signed)
Scheduled appt per 4/26 los - pt to get an updated schedule next visit.

## 2017-10-22 NOTE — Assessment & Plan Note (Addendum)
This is a very pleasant 47year old white male with paroxysmal nocturnal hemoglobinuria and currently on treatment with Soliris on days 1 and 15 every 4 weeks status post 38cycles. He tolerated the last cycle of his treatment well.He continues to have hyperbilirubinemia likely secondary to his disease.  His CBC and liver enzymes remain stable.Bilirubin remains elevated at 2.7 which is stable for the patient. Okay to treat with an elevated bilirubin. LDH is normal today. Recommend that he proceed with day 1 of cycle #39 today as scheduled. He will continue to have his labs and Soliris every 2 weeks.  Follow-up visit will be in4 weeks for evaluation before startinghis next cycle of treatment.  For his left knee pain, recommend that he rest this as much as possible and use ice.  Also recommend that he buy a knee brace at his local pharmacy.  He should follow-up with his primary care provider if his left knee pain worsens or is not improving in the next week.  He was advised to call immediately if he has any concerning symptoms in the interval.  The patient voices understanding of current disease status and treatment options and is in agreement with the current care plan. All questions were answered. The patient knows to call the clinic with any problems, questions or concerns. We can certainly see the patient much sooner if necessary.

## 2017-10-22 NOTE — Progress Notes (Signed)
[facility] OFFICE PROGRESS NOTE  [name], MD [address] 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  PRIOR THERAPY: The patient was previously treated with a tapering dose of prednisone and high-dose IVIG for the hemolytic anemia before his diagnosis of paroxysmal nocturnal hemoglobinuria.  CURRENT THERAPY: Soliris (Eculizumab) initially with induction 600 MG IV weekly for 4 weeks and currently on maintenance treatment 900 MG IV every 2 weeks. First dose of treatment was given on 11/21/2014. Status post 38cycles.  INTERVAL HISTORY: Travis Mcdaniel 47 y.o. male returns for routine follow-up visit by himself.  Patient is feeling fine today and has no specific complaints except for left knee pain.  He reports that he slid while mowing the lawn and injured his left knee.  He has used tramadol on several occasions without much relief.  He is wondering if he should go to see orthopedics.  The patient denies fevers and chills.  Denies chest pain, shortness breath, cough, hemoptysis.  Denies nausea, vomiting, constipation, diarrhea.  Denies bleeding.  Denies recent weight loss or night sweats.  The patient is here for evaluation prior to starting cycle #39 of his treatment.  MEDICAL HISTORY: Past Medical History:  Diagnosis Date  . Diverticulosis   . Encounter for antineoplastic chemotherapy 01/15/2016  . Hypertension     ALLERGIES:  has No Known Allergies.  MEDICATIONS:  Current Outpatient Medications  Medication Sig Dispense Refill  . ALPRAZolam (XANAX) 1 MG tablet Take 1 mg by mouth 3 (three) times daily.  2  . aspirin EC 81 MG tablet Take 81 mg by mouth.    . hydrochlorothiazide (HYDRODIURIL) 25 MG tablet Take by mouth.    [name] Kitchen lisinopril-hydrochlorothiazide (PRINZIDE,ZESTORETIC) 20-25 MG tablet Take 1 tablet by mouth daily.  1  . Multiple Vitamin (MULTIVITAMIN WITH  MINERALS) TABS tablet Take 1 tablet by mouth daily.    . Psyllium (METAMUCIL PO) Take 1 capsule by mouth daily.    . traMADol (ULTRAM) 50 MG tablet Take 1 tablet (50 mg total) 2 (two) times daily as needed by mouth. 30 tablet 0  . Vitamins/Minerals TABS Take by mouth.     No current facility-administered medications for this visit.    Facility-Administered Medications Ordered in Other Visits  Medication Dose Route Frequency Provider Last Rate Last Dose  . 0.9 %  sodium chloride infusion   Intravenous Once Ladell Pier, MD      . eculizumab (SOLIRIS) 900 mg in sodium chloride 0.9 % 90 mL infusion  900 mg Intravenous Once Ladell Pier, MD        SURGICAL HISTORY: No past surgical history on file.  REVIEW OF SYSTEMS:   Review of Systems  Constitutional: Negative for appetite change, chills, fatigue, fever and unexpected weight change.  HENT:   Negative for mouth sores, nosebleeds, sore throat and trouble swallowing.   Eyes: Negative for eye problems and icterus.  Respiratory: Negative for cough, hemoptysis, shortness of breath and wheezing.   Cardiovascular: Negative for chest pain and leg swelling.  Gastrointestinal: Negative for abdominal pain, constipation, diarrhea, nausea and vomiting.  Genitourinary: Negative for bladder incontinence, difficulty urinating, dysuria, frequency and hematuria.   Musculoskeletal: Negative for back pain, gait problem, neck pain and neck stiffness. Positive for left knee pain. Skin: Negative for itching and rash.  Neurological: Negative for dizziness, extremity weakness, gait problem, headaches, light-headedness and seizures.  Hematological: Negative for adenopathy. Does not  bruise/bleed easily.  Psychiatric/Behavioral: Negative for confusion, depression and sleep disturbance. The patient is not nervous/anxious.     PHYSICAL EXAMINATION:  Blood pressure 115/76, pulse 68, temperature 98.3 F (36.8 C), temperature source Oral, resp. rate 18, height  5\' 6"  (1.676 m), weight 164 lb 12.8 oz (74.8 kg), SpO2 100 %.  ECOG PERFORMANCE STATUS: 1 - Symptomatic but completely ambulatory  Physical Exam  Constitutional: Oriented to person, place, and time and well-developed, well-nourished, and in no distress. No distress.  HENT:  Head: Normocephalic and atraumatic.  Mouth/Throat: Oropharynx is clear and moist. No oropharyngeal exudate.  Eyes: Conjunctivae are normal. Right eye exhibits no discharge. Left eye exhibits no discharge. No scleral icterus.  Neck: Normal range of motion. Neck supple.  Cardiovascular: Normal rate, regular rhythm, normal heart sounds and intact distal pulses.   Pulmonary/Chest: Effort normal and breath sounds normal. No respiratory distress. No wheezes. No rales.  Abdominal: Soft. Bowel sounds are normal. Exhibits no distension and no mass. There is no tenderness.  Musculoskeletal: Normal range of motion. Exhibits no edema. Left knee without bruising or edema.  Tenderness with palpation in the posterior aspect of the knee. Lymphadenopathy:    No cervical adenopathy.  Neurological: Alert and oriented to person, place, and time. Exhibits normal muscle tone. Gait normal. Coordination normal.  Skin: Skin is warm and dry. No rash noted. Not diaphoretic. No erythema. No pallor.  Psychiatric: Mood, memory and judgment normal.  Vitals reviewed.  LABORATORY DATA: Lab Results  Component Value Date   WBC 3.7 (L) 10/22/2017   HGB 12.4 (L) 10/22/2017   HCT 36.5 (L) 10/22/2017   MCV 112.0 (H) 10/22/2017   PLT 149 10/22/2017      Chemistry      Component Value Date/Time   NA 138 10/22/2017 0832   NA 136 06/28/2017 1236   K 3.9 10/22/2017 0832   K 4.1 06/28/2017 1236   CL 105 10/22/2017 0832   CO2 23 10/22/2017 0832   CO2 24 06/28/2017 1236   BUN 14 10/22/2017 0832   BUN 17.1 06/28/2017 1236   CREATININE 0.94 10/22/2017 0832   CREATININE 0.88 09/22/2017 1241   CREATININE 1.0 06/28/2017 1236      Component Value  Date/Time   CALCIUM 9.4 10/22/2017 0832   CALCIUM 9.1 06/28/2017 1236   ALKPHOS 60 10/22/2017 0832   ALKPHOS 66 06/28/2017 1236   AST 36 (H) 10/22/2017 0832   AST 44 (H) 09/22/2017 1241   AST 40 (H) 06/28/2017 1236   ALT 46 10/22/2017 0832   ALT 64 (H) 09/22/2017 1241   ALT 60 (H) 06/28/2017 1236   BILITOT 2.7 (H) 10/22/2017 0832   BILITOT 2.6 (H) 09/22/2017 1241   BILITOT 2.87 (H) 06/28/2017 1236       RADIOGRAPHIC STUDIES:  No results found.   ASSESSMENT/PLAN:  Paroxysmal nocturnal hemoglobinuria (PNH) (HCC) This is a very pleasant 47year old white male with paroxysmal nocturnal hemoglobinuria and currently on treatment with Soliris on days 1 and 15 every 4 weeks status post 38cycles. He tolerated the last cycle of his treatment well.He continues to have hyperbilirubinemia likely secondary to his disease.  His CBC and liver enzymes remain stable.Bilirubin remains elevated at 2.7 which is stable for the patient. Okay to treat with an elevated bilirubin. LDH is normal today. Recommend that he proceed with day 1 of cycle #39 today as scheduled. He will continue to have his labs and Soliris every 2 weeks.  Follow-up visit will be in4 weeks  for evaluation before startinghis next cycle of treatment.  For his left knee pain, recommend that he rest this as much as possible and use ice.  Also recommend that he buy a knee brace at his local pharmacy.  He should follow-up with his primary care provider if his left knee pain worsens or is not improving in the next week.  He was advised to call immediately if he has any concerning symptoms in the interval.  The patient voices understanding of current disease status and treatment options and is in agreement with the current care plan. All questions were answered. The patient knows to call the clinic with any problems, questions or concerns. We can certainly see the patient much sooner if necessary.   No orders of the  defined types were placed in this encounter.  Mikey Bussing, DNP, AGPCNP-BC, AOCNP 10/22/17

## 2017-11-03 ENCOUNTER — Other Ambulatory Visit: Payer: BLUE CROSS/BLUE SHIELD

## 2017-11-03 ENCOUNTER — Ambulatory Visit: Payer: BLUE CROSS/BLUE SHIELD

## 2017-11-05 ENCOUNTER — Other Ambulatory Visit: Payer: Self-pay | Admitting: Internal Medicine

## 2017-11-05 ENCOUNTER — Inpatient Hospital Stay: Payer: BLUE CROSS/BLUE SHIELD | Attending: Oncology

## 2017-11-05 ENCOUNTER — Inpatient Hospital Stay: Payer: BLUE CROSS/BLUE SHIELD

## 2017-11-05 VITALS — BP 122/68 | HR 63 | Temp 98.0°F | Resp 17

## 2017-11-05 DIAGNOSIS — D595 Paroxysmal nocturnal hemoglobinuria [Marchiafava-Micheli]: Secondary | ICD-10-CM | POA: Insufficient documentation

## 2017-11-05 DIAGNOSIS — I10 Essential (primary) hypertension: Secondary | ICD-10-CM | POA: Diagnosis not present

## 2017-11-05 DIAGNOSIS — Z5112 Encounter for antineoplastic immunotherapy: Secondary | ICD-10-CM | POA: Diagnosis present

## 2017-11-05 LAB — CBC WITH DIFFERENTIAL/PLATELET
Basophils Absolute: 0 10*3/uL (ref 0.0–0.1)
Basophils Relative: 1 %
EOS PCT: 7 %
Eosinophils Absolute: 0.3 10*3/uL (ref 0.0–0.5)
HCT: 38.3 % — ABNORMAL LOW (ref 38.4–49.9)
Hemoglobin: 13.1 g/dL (ref 13.0–17.1)
LYMPHS ABS: 0.9 10*3/uL (ref 0.9–3.3)
LYMPHS PCT: 22 %
MCH: 38.8 pg — AB (ref 27.2–33.4)
MCHC: 34.2 g/dL (ref 32.0–36.0)
MCV: 113.3 fL — AB (ref 79.3–98.0)
MONO ABS: 0.4 10*3/uL (ref 0.1–0.9)
MONOS PCT: 8 %
Neutro Abs: 2.7 10*3/uL (ref 1.5–6.5)
Neutrophils Relative %: 62 %
PLATELETS: 119 10*3/uL — AB (ref 140–400)
RBC: 3.38 MIL/uL — AB (ref 4.20–5.82)
RDW: 14.7 % — ABNORMAL HIGH (ref 11.0–14.6)
WBC: 4.3 10*3/uL (ref 4.0–10.3)

## 2017-11-05 LAB — COMPREHENSIVE METABOLIC PANEL
ALBUMIN: 4.4 g/dL (ref 3.5–5.0)
ALT: 50 U/L (ref 0–55)
AST: 35 U/L — AB (ref 5–34)
Alkaline Phosphatase: 55 U/L (ref 40–150)
Anion gap: 7 (ref 3–11)
BUN: 16 mg/dL (ref 7–26)
CHLORIDE: 105 mmol/L (ref 98–109)
CO2: 25 mmol/L (ref 22–29)
Calcium: 9.1 mg/dL (ref 8.4–10.4)
Creatinine, Ser: 0.94 mg/dL (ref 0.70–1.30)
GFR calc Af Amer: >60 mL/min (ref >60)
GFR calc non Af Amer: >60 mL/min (ref >60)
GLUCOSE: 96 mg/dL (ref 70–140)
POTASSIUM: 4 mmol/L (ref 3.5–5.1)
SODIUM: 137 mmol/L (ref 136–145)
Total Bilirubin: 3.6 mg/dL (ref 0.2–1.2)
Total Protein: 7 g/dL (ref 6.4–8.3)

## 2017-11-05 LAB — LACTATE DEHYDROGENASE: LDH: 214 U/L (ref 125–245)

## 2017-11-05 MED ORDER — SODIUM CHLORIDE 0.9 % IV SOLN
900.0000 mg | Freq: Once | INTRAVENOUS | Status: AC
  Administered 2017-11-05: 900 mg via INTRAVENOUS
  Filled 2017-11-05: qty 90

## 2017-11-05 MED ORDER — SODIUM CHLORIDE 0.9 % IV SOLN
Freq: Once | INTRAVENOUS | Status: AC
  Administered 2017-11-05: 09:00:00 via INTRAVENOUS

## 2017-11-05 NOTE — Progress Notes (Signed)
Ok to proceed with treatment without CMET results per Erasmo Downer, RN.  Raul Del, PharmD, BCPS, BCOP

## 2017-11-05 NOTE — Patient Instructions (Signed)
[facility] Discharge Instructions for Patients Receiving Chemotherapy  Today you received the following chemotherapy agents: Eculizumab (Solaris).  To help prevent nausea and vomiting after your treatment, we encourage you to take your nausea medication as prescribed.  If you develop nausea and vomiting that is not controlled by your nausea medication, call the clinic.   BELOW ARE SYMPTOMS THAT SHOULD BE REPORTED IMMEDIATELY:  *FEVER GREATER THAN 100.5 F  *CHILLS WITH OR WITHOUT FEVER  NAUSEA AND VOMITING THAT IS NOT CONTROLLED WITH YOUR NAUSEA MEDICATION  *UNUSUAL SHORTNESS OF BREATH  *UNUSUAL BRUISING OR BLEEDING  TENDERNESS IN MOUTH AND THROAT WITH OR WITHOUT PRESENCE OF ULCERS  *URINARY PROBLEMS  *BOWEL PROBLEMS  UNUSUAL RASH Items with * indicate a potential emergency and should be followed up as soon as possible.  Feel free to call the clinic should you have any questions or concerns. The clinic phone number is [phone].  Please show the [place] at check-in to the Emergency Department and triage nurse.

## 2017-11-18 NOTE — Progress Notes (Signed)
[facility] OFFICE PROGRESS NOTE  [name], MD [address] 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  PRIOR THERAPY: The patient was previously treated with a tapering dose of prednisone and high-dose IVIG for the hemolytic anemia before his diagnosis of paroxysmal nocturnal hemoglobinuria.  CURRENT THERAPY: Soliris (Eculizumab) initially with induction 600 MG IV weekly for 4 weeks and currently on maintenance treatment 900 MG IV every 2 weeks. First dose of treatment was given on 11/21/2014. Status post 39cycles.  INTERVAL HISTORY: Travis Mcdaniel 47 y.o. male returns for routine follow-up visit by himself.  The patient is feeling fine today and has no specific complaints.  Patient denies fevers and chills.  Denies chest pain, shortness of breath, cough, hemoptysis.  Denies nausea, vomiting, constipation, diarrhea.  Denies recent weight loss or night sweats.  Denies bleeding.  The patient is here for evaluation prior to starting cycle #40 of his treatment.  MEDICAL HISTORY: Past Medical History:  Diagnosis Date  . Diverticulosis   . Encounter for antineoplastic chemotherapy 01/15/2016  . Hypertension     ALLERGIES:  has No Known Allergies.  MEDICATIONS:  Current Outpatient Medications  Medication Sig Dispense Refill  . ALPRAZolam (XANAX) 1 MG tablet Take 1 mg by mouth 3 (three) times daily.  2  . aspirin EC 81 MG tablet Take 81 mg by mouth.    . hydrochlorothiazide (HYDRODIURIL) 25 MG tablet Take by mouth.    [name] Kitchen lisinopril-hydrochlorothiazide (PRINZIDE,ZESTORETIC) 20-25 MG tablet Take 1 tablet by mouth daily.  1  . Multiple Vitamin (MULTIVITAMIN WITH MINERALS) TABS tablet Take 1 tablet by mouth daily.    . Psyllium (METAMUCIL PO) Take 1 capsule by mouth daily.    . traMADol (ULTRAM) 50 MG tablet Take 1 tablet (50 mg total) 2 (two) times daily as needed by mouth.  30 tablet 0  . Vitamins/Minerals TABS Take by mouth.     No current facility-administered medications for this visit.     SURGICAL HISTORY: History reviewed. No pertinent surgical history.  REVIEW OF SYSTEMS:   Review of Systems  Constitutional: Negative for appetite change, chills, fatigue, fever and unexpected weight change.  HENT:   Negative for mouth sores, nosebleeds, sore throat and trouble swallowing.   Eyes: Negative for eye problems and icterus.  Respiratory: Negative for cough, hemoptysis, shortness of breath and wheezing.   Cardiovascular: Negative for chest pain and leg swelling.  Gastrointestinal: Negative for abdominal pain, constipation, diarrhea, nausea and vomiting.  Genitourinary: Negative for bladder incontinence, difficulty urinating, dysuria, frequency and hematuria.   Musculoskeletal: Negative for back pain, gait problem, neck pain and neck stiffness.  Skin: Negative for itching and rash.  Neurological: Negative for dizziness, extremity weakness, gait problem, headaches, light-headedness and seizures.  Hematological: Negative for adenopathy. Does not bruise/bleed easily.  Psychiatric/Behavioral: Negative for confusion, depression and sleep disturbance. The patient is not nervous/anxious.     PHYSICAL EXAMINATION:  Blood pressure 108/67, pulse 84, temperature 98.3 F (36.8 C), temperature source Oral, resp. rate 17, height 5\' 6"  (1.676 m), weight 166 lb (75.3 kg), SpO2 100 %.  ECOG PERFORMANCE STATUS: 1 - Symptomatic but completely ambulatory  Physical Exam  Constitutional: Oriented to person, place, and time and well-developed, well-nourished, and in no distress. No distress.  HENT:  Head: Normocephalic and atraumatic.  Mouth/Throat: Oropharynx is clear and moist. No oropharyngeal exudate.  Eyes: Conjunctivae are normal. Right eye exhibits no discharge. Left  eye exhibits no discharge. No scleral icterus.  Neck: Normal range of motion. Neck supple.   Cardiovascular: Normal rate, regular rhythm, normal heart sounds and intact distal pulses.   Pulmonary/Chest: Effort normal and breath sounds normal. No respiratory distress. No wheezes. No rales.  Abdominal: Soft. Bowel sounds are normal. Exhibits no distension and no mass. There is no tenderness.  Musculoskeletal: Normal range of motion. Exhibits no edema.  Lymphadenopathy:    No cervical adenopathy.  Neurological: Alert and oriented to person, place, and time. Exhibits normal muscle tone. Gait normal. Coordination normal.  Skin: Skin is warm and dry. No rash noted. Not diaphoretic. No erythema. No pallor.  Psychiatric: Mood, memory and judgment normal.  Vitals reviewed.  LABORATORY DATA: Lab Results  Component Value Date   WBC 4.4 11/19/2017   HGB 12.2 (L) 11/19/2017   HCT 35.4 (L) 11/19/2017   MCV 114.9 (H) 11/19/2017   PLT 135 (L) 11/19/2017      Chemistry      Component Value Date/Time   NA 138 11/19/2017 0740   NA 136 06/28/2017 1236   K 3.9 11/19/2017 0740   K 4.1 06/28/2017 1236   CL 104 11/19/2017 0740   CO2 23 11/19/2017 0740   CO2 24 06/28/2017 1236   BUN 15 11/19/2017 0740   BUN 17.1 06/28/2017 1236   CREATININE 0.89 11/19/2017 0740   CREATININE 0.88 09/22/2017 1241   CREATININE 1.0 06/28/2017 1236      Component Value Date/Time   CALCIUM 9.1 11/19/2017 0740   CALCIUM 9.1 06/28/2017 1236   ALKPHOS 56 11/19/2017 0740   ALKPHOS 66 06/28/2017 1236   AST 61 (H) 11/19/2017 0740   AST 44 (H) 09/22/2017 1241   AST 40 (H) 06/28/2017 1236   ALT 83 (H) 11/19/2017 0740   ALT 64 (H) 09/22/2017 1241   ALT 60 (H) 06/28/2017 1236   BILITOT 3.7 (HH) 11/19/2017 0740   BILITOT 2.6 (H) 09/22/2017 1241   BILITOT 2.87 (H) 06/28/2017 1236       RADIOGRAPHIC STUDIES:  No results found.   ASSESSMENT/PLAN:  Paroxysmal nocturnal hemoglobinuria (PNH) (HCC) This is a very pleasant 47year old white male with paroxysmal nocturnal hemoglobinuria and currently on  treatment with Soliris on days 1 and 15 every 4 weeks status post 39cycles. He tolerated the last cycle of his treatment well.He continues to have hyperbilirubinemia likely secondary to his disease.  His CBC and liver enzymes remain stable.Bilirubin remains elevated at 3.7.  Labs reviewed with Dr. Julien Nordmann.  This is likely related to the disease process.  We will continue to monitor this closely.  Okay to proceed with treatment as scheduled today.  LDH is pending. Recommend that he proceed with day 1 of cycle #40today as scheduled. He will continue to have his labs and Soliris every 2 weeks.  Follow-up visit will be in4 weeks for evaluation before startinghis next cycle of treatment.  He was advised to call immediately if he has any concerning symptoms in the interval.  The patient voices understanding of current disease status and treatment options and is in agreement with the current care plan. All questions were answered. The patient knows to call the clinic with any problems, questions or concerns. We can certainly see the patient much sooner if necessary.   No orders of the defined types were placed in this encounter.  Mikey Bussing, DNP, AGPCNP-BC, AOCNP 11/19/17

## 2017-11-18 NOTE — Assessment & Plan Note (Addendum)
This is a very pleasant 47year old white male with paroxysmal nocturnal hemoglobinuria and currently on treatment with Soliris on days 1 and 15 every 4 weeks status post 39cycles. He tolerated the last cycle of his treatment well.He continues to have hyperbilirubinemia likely secondary to his disease.  His CBC and liver enzymes remain stable.Bilirubin remains elevated at 3.7.  Labs reviewed with Dr. Julien Nordmann.  This is likely related to the disease process.  We will continue to monitor this closely.  Okay to proceed with treatment as scheduled today.  LDH is pending. Recommend that he proceed with day 1 of cycle #40today as scheduled. He will continue to have his labs and Soliris every 2 weeks.  Follow-up visit will be in4 weeks for evaluation before startinghis next cycle of treatment.  He was advised to call immediately if he has any concerning symptoms in the interval.  The patient voices understanding of current disease status and treatment options and is in agreement with the current care plan. All questions were answered. The patient knows to call the clinic with any problems, questions or concerns. We can certainly see the patient much sooner if necessary.

## 2017-11-19 ENCOUNTER — Telehealth: Payer: Self-pay | Admitting: Oncology

## 2017-11-19 ENCOUNTER — Encounter: Payer: Self-pay | Admitting: Oncology

## 2017-11-19 ENCOUNTER — Inpatient Hospital Stay: Payer: BLUE CROSS/BLUE SHIELD

## 2017-11-19 ENCOUNTER — Inpatient Hospital Stay: Payer: BLUE CROSS/BLUE SHIELD | Admitting: Oncology

## 2017-11-19 VITALS — BP 108/67 | HR 84 | Temp 98.3°F | Resp 17 | Ht 66.0 in | Wt 166.0 lb

## 2017-11-19 DIAGNOSIS — D595 Paroxysmal nocturnal hemoglobinuria [Marchiafava-Micheli]: Secondary | ICD-10-CM

## 2017-11-19 DIAGNOSIS — Z5111 Encounter for antineoplastic chemotherapy: Secondary | ICD-10-CM

## 2017-11-19 DIAGNOSIS — Z5112 Encounter for antineoplastic immunotherapy: Secondary | ICD-10-CM | POA: Diagnosis not present

## 2017-11-19 DIAGNOSIS — I10 Essential (primary) hypertension: Secondary | ICD-10-CM

## 2017-11-19 LAB — CBC WITH DIFFERENTIAL/PLATELET
Basophils Absolute: 0 10*3/uL (ref 0.0–0.1)
Basophils Relative: 1 %
Eosinophils Absolute: 0.2 10*3/uL (ref 0.0–0.5)
Eosinophils Relative: 5 %
HEMATOCRIT: 35.4 % — AB (ref 38.4–49.9)
Hemoglobin: 12.2 g/dL — ABNORMAL LOW (ref 13.0–17.1)
LYMPHS PCT: 21 %
Lymphs Abs: 0.9 10*3/uL (ref 0.9–3.3)
MCH: 39.5 pg — AB (ref 27.2–33.4)
MCHC: 34.4 g/dL (ref 32.0–36.0)
MCV: 114.9 fL — ABNORMAL HIGH (ref 79.3–98.0)
Monocytes Absolute: 0.5 10*3/uL (ref 0.1–0.9)
Monocytes Relative: 11 %
Neutro Abs: 2.8 10*3/uL (ref 1.5–6.5)
Neutrophils Relative %: 62 %
Platelets: 135 10*3/uL — ABNORMAL LOW (ref 140–400)
RBC: 3.08 MIL/uL — ABNORMAL LOW (ref 4.20–5.82)
RDW: 16 % — AB (ref 11.0–14.6)
WBC: 4.4 10*3/uL (ref 4.0–10.3)

## 2017-11-19 LAB — COMPREHENSIVE METABOLIC PANEL
ALBUMIN: 4.2 g/dL (ref 3.5–5.0)
ALK PHOS: 56 U/L (ref 40–150)
ALT: 83 U/L — AB (ref 0–55)
AST: 61 U/L — AB (ref 5–34)
Anion gap: 11 (ref 3–11)
BILIRUBIN TOTAL: 3.7 mg/dL — AB (ref 0.2–1.2)
BUN: 15 mg/dL (ref 7–26)
CALCIUM: 9.1 mg/dL (ref 8.4–10.4)
CO2: 23 mmol/L (ref 22–29)
Chloride: 104 mmol/L (ref 98–109)
Creatinine, Ser: 0.89 mg/dL (ref 0.70–1.30)
GFR calc Af Amer: >60 mL/min (ref >60)
GFR calc non Af Amer: >60 mL/min (ref >60)
GLUCOSE: 95 mg/dL (ref 70–140)
POTASSIUM: 3.9 mmol/L (ref 3.5–5.1)
Sodium: 138 mmol/L (ref 136–145)
TOTAL PROTEIN: 6.9 g/dL (ref 6.4–8.3)

## 2017-11-19 LAB — LACTATE DEHYDROGENASE: LDH: 259 U/L — ABNORMAL HIGH (ref 125–245)

## 2017-11-19 MED ORDER — SODIUM CHLORIDE 0.9 % IV SOLN
Freq: Once | INTRAVENOUS | Status: AC
Start: 2017-11-19 — End: 2017-11-19
  Administered 2017-11-19: 11:00:00 via INTRAVENOUS

## 2017-11-19 MED ORDER — SODIUM CHLORIDE 0.9 % IV SOLN
900.0000 mg | Freq: Once | INTRAVENOUS | Status: AC
  Administered 2017-11-19: 900 mg via INTRAVENOUS
  Filled 2017-11-19: qty 90

## 2017-11-19 NOTE — Patient Instructions (Signed)
[facility] Discharge Instructions for Patients Receiving Chemotherapy  Today you received the following chemotherapy agents Soliris To help prevent nausea and vomiting after your treatment, we encourage you to take your nausea medication as prescribed.   If you develop nausea and vomiting that is not controlled by your nausea medication, call the clinic.   BELOW ARE SYMPTOMS THAT SHOULD BE REPORTED IMMEDIATELY:  *FEVER GREATER THAN 100.5 F  *CHILLS WITH OR WITHOUT FEVER  NAUSEA AND VOMITING THAT IS NOT CONTROLLED WITH YOUR NAUSEA MEDICATION  *UNUSUAL SHORTNESS OF BREATH  *UNUSUAL BRUISING OR BLEEDING  TENDERNESS IN MOUTH AND THROAT WITH OR WITHOUT PRESENCE OF ULCERS  *URINARY PROBLEMS  *BOWEL PROBLEMS  UNUSUAL RASH Items with * indicate a potential emergency and should be followed up as soon as possible.  Feel free to call the clinic should you have any questions or concerns. The clinic phone number is [phone].  Please show the CHEMO ALERT CARD at check-in to the Emergency Department and triage nurse.   

## 2017-11-19 NOTE — Telephone Encounter (Signed)
Scheduled appt per 5/24 los - pt to get an updated schedule in the treatment area.

## 2017-12-01 ENCOUNTER — Telehealth: Payer: Self-pay | Admitting: *Deleted

## 2017-12-01 NOTE — Telephone Encounter (Signed)
I reached out to Pt, discussed his insurance has denied coverage for Treatment, his appt on Friday 12/03/17 will be cancelled and we are requesting Insurance company to provide the names of the clinics/facility that he can be referred to to receive this treatment.   Upon getting this information MD will refer him to that location.  Pt verbalized understanding. Message to scheduling to cancel 6/7 appt

## 2017-12-02 ENCOUNTER — Encounter: Payer: Self-pay | Admitting: Medical Oncology

## 2017-12-02 ENCOUNTER — Telehealth: Payer: Self-pay | Admitting: Medical Oncology

## 2017-12-02 NOTE — Telephone Encounter (Signed)
Solaris infusion- Dawn is working with [employer] to help Zekiel continue his Solaris treatment at our facility. She will call me back. Pt notified.

## 2017-12-03 ENCOUNTER — Inpatient Hospital Stay: Payer: PRIVATE HEALTH INSURANCE

## 2017-12-03 ENCOUNTER — Inpatient Hospital Stay: Payer: BLUE CROSS/BLUE SHIELD | Attending: Internal Medicine

## 2017-12-03 DIAGNOSIS — D595 Paroxysmal nocturnal hemoglobinuria [Marchiafava-Micheli]: Secondary | ICD-10-CM | POA: Diagnosis present

## 2017-12-03 LAB — COMPREHENSIVE METABOLIC PANEL
ALT: 51 U/L (ref 0–55)
ANION GAP: 12 — AB (ref 3–11)
AST: 36 U/L — ABNORMAL HIGH (ref 5–34)
Albumin: 4.6 g/dL (ref 3.5–5.0)
Alkaline Phosphatase: 60 U/L (ref 40–150)
BUN: 16 mg/dL (ref 7–26)
CHLORIDE: 105 mmol/L (ref 98–109)
CO2: 22 mmol/L (ref 22–29)
CREATININE: 0.95 mg/dL (ref 0.70–1.30)
Calcium: 9.5 mg/dL (ref 8.4–10.4)
Glucose, Bld: 93 mg/dL (ref 70–140)
POTASSIUM: 3.9 mmol/L (ref 3.5–5.1)
SODIUM: 139 mmol/L (ref 136–145)
Total Bilirubin: 2.4 mg/dL — ABNORMAL HIGH (ref 0.2–1.2)
Total Protein: 7.3 g/dL (ref 6.4–8.3)

## 2017-12-03 LAB — CBC WITH DIFFERENTIAL/PLATELET
Basophils Absolute: 0 10*3/uL (ref 0.0–0.1)
Basophils Relative: 1 %
Eosinophils Absolute: 0.2 10*3/uL (ref 0.0–0.5)
Eosinophils Relative: 6 %
HEMATOCRIT: 37.8 % — AB (ref 38.4–49.9)
HEMOGLOBIN: 12.8 g/dL — AB (ref 13.0–17.1)
LYMPHS ABS: 0.8 10*3/uL — AB (ref 0.9–3.3)
Lymphocytes Relative: 20 %
MCH: 38.8 pg — AB (ref 27.2–33.4)
MCHC: 33.9 g/dL (ref 32.0–36.0)
MCV: 114.3 fL — ABNORMAL HIGH (ref 79.3–98.0)
Monocytes Absolute: 0.4 10*3/uL (ref 0.1–0.9)
Monocytes Relative: 10 %
NEUTROS ABS: 2.6 10*3/uL (ref 1.5–6.5)
NEUTROS PCT: 63 %
Platelets: 139 10*3/uL — ABNORMAL LOW (ref 140–400)
RBC: 3.31 MIL/uL — AB (ref 4.20–5.82)
RDW: 14.4 % (ref 11.0–14.6)
WBC: 4.1 10*3/uL (ref 4.0–10.3)

## 2017-12-03 LAB — LACTATE DEHYDROGENASE: LDH: 233 U/L (ref 125–245)

## 2017-12-16 NOTE — Progress Notes (Signed)
[facility] OFFICE PROGRESS NOTE  [name], MD [address] 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  PRIOR THERAPY: The patient was previously treated with a tapering dose of prednisone and high-dose IVIG for the hemolytic anemia before his diagnosis of paroxysmal nocturnal hemoglobinuria.  CURRENT THERAPY: Soliris (Eculizumab) initially with induction 600 MG IV weekly for 4 weeks and currently on maintenance treatment 900 MG IV every 2 weeks. First dose of treatment was given on 11/21/2014. Status post 40cycles.  INTERVAL HISTORY: Travis Mcdaniel 47 y.o. male returns for routine follow-up visit by himself.  The patient is feeling fine today and has no specific complaints.  He is now receiving his Soliris added in infusion center in [place].  He denies fevers and chills.  Denies chest pain, shortness breath, cough, hemoptysis.  Denies nausea, vomiting, constipation, diarrhea.  Denies recent weight loss or night sweats.  Denies bleeding, bruising, ecchymosis.  The patient is here for evaluation prior to starting cycle #41 of his treatment.  MEDICAL HISTORY: Past Medical History:  Diagnosis Date  . Diverticulosis   . Encounter for antineoplastic chemotherapy 01/15/2016  . Hypertension     ALLERGIES:  has No Known Allergies.  MEDICATIONS:  Current Outpatient Medications  Medication Sig Dispense Refill  . ALPRAZolam (XANAX) 1 MG tablet Take 1 mg by mouth 3 (three) times daily.  2  . aspirin EC 81 MG tablet Take 81 mg by mouth.    . hydrochlorothiazide (HYDRODIURIL) 25 MG tablet Take by mouth.    [name] Kitchen lisinopril-hydrochlorothiazide (PRINZIDE,ZESTORETIC) 20-25 MG tablet Take 1 tablet by mouth daily.  1  . Multiple Vitamin (MULTIVITAMIN WITH MINERALS) TABS tablet Take 1 tablet by mouth daily.    . Psyllium (METAMUCIL PO) Take 1 capsule by mouth daily.    . traMADol  (ULTRAM) 50 MG tablet Take 1 tablet (50 mg total) 2 (two) times daily as needed by mouth. 30 tablet 0  . Vitamins/Minerals TABS Take by mouth.     No current facility-administered medications for this visit.     SURGICAL HISTORY: History reviewed. No pertinent surgical history.  REVIEW OF SYSTEMS:   Review of Systems  Constitutional: Negative for appetite change, chills, fatigue, fever and unexpected weight change.  HENT:   Negative for mouth sores, nosebleeds, sore throat and trouble swallowing.   Eyes: Negative for eye problems and icterus.  Respiratory: Negative for cough, hemoptysis, shortness of breath and wheezing.   Cardiovascular: Negative for chest pain and leg swelling.  Gastrointestinal: Negative for abdominal pain, constipation, diarrhea, nausea and vomiting.  Genitourinary: Negative for bladder incontinence, difficulty urinating, dysuria, frequency and hematuria.   Musculoskeletal: Negative for back pain, gait problem, neck pain and neck stiffness.  Skin: Negative for itching and rash.  Neurological: Negative for dizziness, extremity weakness, gait problem, headaches, light-headedness and seizures.  Hematological: Negative for adenopathy. Does not bruise/bleed easily.  Psychiatric/Behavioral: Negative for confusion, depression and sleep disturbance. The patient is not nervous/anxious.     PHYSICAL EXAMINATION:  Blood pressure 128/73, pulse 67, temperature 98.2 F (36.8 C), temperature source Oral, resp. rate 18, height 5\' 6"  (1.676 m), weight 167 lb 8 oz (76 kg), SpO2 99 %.  ECOG PERFORMANCE STATUS: 1 - Symptomatic but completely ambulatory  Physical Exam  Constitutional: Oriented to person, place, and time and well-developed, well-nourished, and in no distress. No distress.  HENT:  Head: Normocephalic and atraumatic.  Mouth/Throat: Oropharynx is clear  and moist. No oropharyngeal exudate.  Eyes: Conjunctivae are normal. Right eye exhibits no discharge. Left eye  exhibits no discharge. No scleral icterus.  Neck: Normal range of motion. Neck supple.  Cardiovascular: Normal rate, regular rhythm, normal heart sounds and intact distal pulses.   Pulmonary/Chest: Effort normal and breath sounds normal. No respiratory distress. No wheezes. No rales.  Abdominal: Soft. Bowel sounds are normal. Exhibits no distension and no mass. There is no tenderness.  Musculoskeletal: Normal range of motion. Exhibits no edema.  Lymphadenopathy:    No cervical adenopathy.  Neurological: Alert and oriented to person, place, and time. Exhibits normal muscle tone. Gait normal. Coordination normal.  Skin: Skin is warm and dry. No rash noted. Not diaphoretic. No erythema. No pallor.  Psychiatric: Mood, memory and judgment normal.  Vitals reviewed.  LABORATORY DATA: Lab Results  Component Value Date   WBC 4.0 12/17/2017   HGB 13.1 12/17/2017   HCT 38.3 (L) 12/17/2017   MCV 113.6 (H) 12/17/2017   PLT 127 (L) 12/17/2017      Chemistry      Component Value Date/Time   NA 137 12/17/2017 0739   NA 136 06/28/2017 1236   K 4.0 12/17/2017 0739   K 4.1 06/28/2017 1236   CL 104 12/17/2017 0739   CO2 22 12/17/2017 0739   CO2 24 06/28/2017 1236   BUN 14 12/17/2017 0739   BUN 17.1 06/28/2017 1236   CREATININE 0.87 12/17/2017 0739   CREATININE 0.88 09/22/2017 1241   CREATININE 1.0 06/28/2017 1236      Component Value Date/Time   CALCIUM 9.2 12/17/2017 0739   CALCIUM 9.1 06/28/2017 1236   ALKPHOS 60 12/17/2017 0739   ALKPHOS 66 06/28/2017 1236   AST 36 (H) 12/17/2017 0739   AST 44 (H) 09/22/2017 1241   AST 40 (H) 06/28/2017 1236   ALT 49 12/17/2017 0739   ALT 64 (H) 09/22/2017 1241   ALT 60 (H) 06/28/2017 1236   BILITOT 2.9 (H) 12/17/2017 0739   BILITOT 2.6 (H) 09/22/2017 1241   BILITOT 2.87 (H) 06/28/2017 1236       RADIOGRAPHIC STUDIES:  No results found.   ASSESSMENT/PLAN:  Paroxysmal nocturnal hemoglobinuria (PNH) (HCC) This is a very pleasant 47year  old white male with paroxysmal nocturnal hemoglobinuria and currently on treatment with Soliris on days 1 and 15 every 4 weeks status post 40cycles. He tolerated the last cycle of his treatment well.He continues to have hyperbilirubinemia likely secondary to his disease.  His CBC remains stable.Bilirubin remains elevated at 2.9.   This is stable for the patient and is likely related to the disease process.  We will continue to monitor this closely. LDH is pending. Recommend that he proceed with day 1 of cycle #41today as scheduled. He will return here every 2 weeks for lab work and will receive his Solaris at the infusion center every 2 weeks.  Follow-up visit will be in 4 weeks for evaluation prior to his next cycle treatment.  He was advised to call immediately if he has any concerning symptoms in the interval.  The patient voices understanding of current disease status and treatment options and is in agreement with the current care plan. All questions were answered. The patient knows to call the clinic with any problems, questions or concerns. We can certainly see the patient much sooner if necessary.   No orders of the defined types were placed in this encounter.  Mikey Bussing, DNP, AGPCNP-BC, AOCNP 12/17/17

## 2017-12-16 NOTE — Assessment & Plan Note (Addendum)
This is a very pleasant 47year old white male with paroxysmal nocturnal hemoglobinuria and currently on treatment with Soliris on days 1 and 15 every 4 weeks status post 40cycles. He tolerated the last cycle of his treatment well.He continues to have hyperbilirubinemia likely secondary to his disease.  His CBC remains stable.Bilirubin remains elevated at 2.9.   This is stable for the patient and is likely related to the disease process.  We will continue to monitor this closely. LDH is pending. Recommend that he proceed with day 1 of cycle #41today as scheduled. He will return here every 2 weeks for lab work and will receive his Solaris at the infusion center every 2 weeks.  Follow-up visit will be in 4 weeks for evaluation prior to his next cycle treatment.  He was advised to call immediately if he has any concerning symptoms in the interval.  The patient voices understanding of current disease status and treatment options and is in agreement with the current care plan. All questions were answered. The patient knows to call the clinic with any problems, questions or concerns. We can certainly see the patient much sooner if necessary.

## 2017-12-17 ENCOUNTER — Inpatient Hospital Stay: Payer: BLUE CROSS/BLUE SHIELD

## 2017-12-17 ENCOUNTER — Inpatient Hospital Stay: Payer: BLUE CROSS/BLUE SHIELD | Admitting: Oncology

## 2017-12-17 ENCOUNTER — Telehealth: Payer: Self-pay

## 2017-12-17 ENCOUNTER — Encounter: Payer: Self-pay | Admitting: Oncology

## 2017-12-17 ENCOUNTER — Ambulatory Visit: Payer: BLUE CROSS/BLUE SHIELD

## 2017-12-17 VITALS — BP 128/73 | HR 67 | Temp 98.2°F | Resp 18 | Ht 66.0 in | Wt 167.5 lb

## 2017-12-17 DIAGNOSIS — D595 Paroxysmal nocturnal hemoglobinuria [Marchiafava-Micheli]: Secondary | ICD-10-CM | POA: Diagnosis not present

## 2017-12-17 LAB — CBC WITH DIFFERENTIAL/PLATELET
Basophils Absolute: 0.1 10*3/uL (ref 0.0–0.1)
Basophils Relative: 2 %
Eosinophils Absolute: 0.2 10*3/uL (ref 0.0–0.5)
Eosinophils Relative: 5 %
HEMATOCRIT: 38.3 % — AB (ref 38.4–49.9)
HEMOGLOBIN: 13.1 g/dL (ref 13.0–17.1)
LYMPHS ABS: 0.9 10*3/uL (ref 0.9–3.3)
LYMPHS PCT: 22 %
MCH: 38.9 pg — AB (ref 27.2–33.4)
MCHC: 34.2 g/dL (ref 32.0–36.0)
MCV: 113.6 fL — AB (ref 79.3–98.0)
MONO ABS: 0.5 10*3/uL (ref 0.1–0.9)
MONOS PCT: 12 %
NEUTROS ABS: 2.4 10*3/uL (ref 1.5–6.5)
NEUTROS PCT: 59 %
Platelets: 127 10*3/uL — ABNORMAL LOW (ref 140–400)
RBC: 3.37 MIL/uL — ABNORMAL LOW (ref 4.20–5.82)
RDW: 14.4 % (ref 11.0–14.6)
WBC: 4 10*3/uL (ref 4.0–10.3)

## 2017-12-17 LAB — COMPREHENSIVE METABOLIC PANEL
ALBUMIN: 4.5 g/dL (ref 3.5–5.0)
ALK PHOS: 60 U/L (ref 40–150)
ALT: 49 U/L (ref 0–55)
ANION GAP: 11 (ref 3–11)
AST: 36 U/L — ABNORMAL HIGH (ref 5–34)
BUN: 14 mg/dL (ref 7–26)
CALCIUM: 9.2 mg/dL (ref 8.4–10.4)
CO2: 22 mmol/L (ref 22–29)
Chloride: 104 mmol/L (ref 98–109)
Creatinine, Ser: 0.87 mg/dL (ref 0.70–1.30)
GLUCOSE: 99 mg/dL (ref 70–140)
POTASSIUM: 4 mmol/L (ref 3.5–5.1)
Sodium: 137 mmol/L (ref 136–145)
TOTAL PROTEIN: 7.1 g/dL (ref 6.4–8.3)
Total Bilirubin: 2.9 mg/dL — ABNORMAL HIGH (ref 0.2–1.2)

## 2017-12-17 LAB — LACTATE DEHYDROGENASE: LDH: 230 U/L (ref 125–245)

## 2017-12-17 NOTE — Telephone Encounter (Signed)
Patient declined avs and calender of upcoming appointments. Per 6/21 los, also moved appointment from Thursday to Friday with L.Causey per MX advice to move for 8/15 to 8/16.

## 2017-12-31 ENCOUNTER — Ambulatory Visit: Payer: BLUE CROSS/BLUE SHIELD

## 2017-12-31 ENCOUNTER — Inpatient Hospital Stay: Payer: BLUE CROSS/BLUE SHIELD | Attending: Internal Medicine

## 2017-12-31 DIAGNOSIS — D595 Paroxysmal nocturnal hemoglobinuria [Marchiafava-Micheli]: Secondary | ICD-10-CM | POA: Diagnosis present

## 2017-12-31 LAB — CBC WITH DIFFERENTIAL/PLATELET
BASOS PCT: 1 %
Basophils Absolute: 0 10*3/uL (ref 0.0–0.1)
Eosinophils Absolute: 0.2 10*3/uL (ref 0.0–0.5)
Eosinophils Relative: 5 %
HEMATOCRIT: 37.2 % — AB (ref 38.4–49.9)
HEMOGLOBIN: 12.7 g/dL — AB (ref 13.0–17.1)
Lymphocytes Relative: 22 %
Lymphs Abs: 1 10*3/uL (ref 0.9–3.3)
MCH: 39.1 pg — AB (ref 27.2–33.4)
MCHC: 34.1 g/dL (ref 32.0–36.0)
MCV: 114.5 fL — AB (ref 79.3–98.0)
MONO ABS: 0.5 10*3/uL (ref 0.1–0.9)
Monocytes Relative: 10 %
NEUTROS ABS: 2.9 10*3/uL (ref 1.5–6.5)
NRBC: 1 /100{WBCs} — AB
Neutrophils Relative %: 62 %
Platelets: 141 10*3/uL (ref 140–400)
RBC: 3.25 MIL/uL — ABNORMAL LOW (ref 4.20–5.82)
RDW: 15.8 % — ABNORMAL HIGH (ref 11.0–14.6)
WBC: 4.7 10*3/uL (ref 4.0–10.3)

## 2017-12-31 LAB — COMPREHENSIVE METABOLIC PANEL
ALK PHOS: 62 U/L (ref 38–126)
ALT: 80 U/L — ABNORMAL HIGH (ref 0–44)
AST: 52 U/L — ABNORMAL HIGH (ref 15–41)
Albumin: 4.5 g/dL (ref 3.5–5.0)
Anion gap: 10 (ref 5–15)
BILIRUBIN TOTAL: 4 mg/dL — AB (ref 0.3–1.2)
BUN: 13 mg/dL (ref 6–20)
CALCIUM: 9.2 mg/dL (ref 8.9–10.3)
CO2: 25 mmol/L (ref 22–32)
CREATININE: 0.88 mg/dL (ref 0.61–1.24)
Chloride: 102 mmol/L (ref 98–111)
Glucose, Bld: 113 mg/dL — ABNORMAL HIGH (ref 70–99)
Potassium: 3.9 mmol/L (ref 3.5–5.1)
Sodium: 137 mmol/L (ref 135–145)
TOTAL PROTEIN: 7.4 g/dL (ref 6.5–8.1)

## 2017-12-31 LAB — LACTATE DEHYDROGENASE: LDH: 264 U/L — AB (ref 98–192)

## 2018-01-11 ENCOUNTER — Telehealth: Payer: Self-pay | Admitting: Oncology

## 2018-01-11 NOTE — Telephone Encounter (Signed)
Tried to reach regarding voicemail °

## 2018-01-14 ENCOUNTER — Inpatient Hospital Stay: Payer: BLUE CROSS/BLUE SHIELD | Admitting: Oncology

## 2018-01-14 ENCOUNTER — Inpatient Hospital Stay: Payer: BLUE CROSS/BLUE SHIELD

## 2018-01-14 ENCOUNTER — Ambulatory Visit: Payer: BLUE CROSS/BLUE SHIELD

## 2018-01-14 ENCOUNTER — Telehealth: Payer: Self-pay | Admitting: Oncology

## 2018-01-14 ENCOUNTER — Encounter: Payer: Self-pay | Admitting: Oncology

## 2018-01-14 VITALS — BP 127/77 | HR 65 | Temp 98.9°F | Resp 18 | Ht 66.0 in | Wt 166.9 lb

## 2018-01-14 DIAGNOSIS — Z5111 Encounter for antineoplastic chemotherapy: Secondary | ICD-10-CM

## 2018-01-14 DIAGNOSIS — D595 Paroxysmal nocturnal hemoglobinuria [Marchiafava-Micheli]: Secondary | ICD-10-CM | POA: Diagnosis not present

## 2018-01-14 LAB — COMPREHENSIVE METABOLIC PANEL
ALK PHOS: 58 U/L (ref 38–126)
ALT: 76 U/L — AB (ref 0–44)
ANION GAP: 10 (ref 5–15)
AST: 53 U/L — ABNORMAL HIGH (ref 15–41)
Albumin: 4.4 g/dL (ref 3.5–5.0)
BILIRUBIN TOTAL: 2.9 mg/dL — AB (ref 0.3–1.2)
BUN: 14 mg/dL (ref 6–20)
CALCIUM: 9.4 mg/dL (ref 8.9–10.3)
CO2: 25 mmol/L (ref 22–32)
Chloride: 103 mmol/L (ref 98–111)
Creatinine, Ser: 0.84 mg/dL (ref 0.61–1.24)
Glucose, Bld: 101 mg/dL — ABNORMAL HIGH (ref 70–99)
Potassium: 3.8 mmol/L (ref 3.5–5.1)
Sodium: 138 mmol/L (ref 135–145)
TOTAL PROTEIN: 7.1 g/dL (ref 6.5–8.1)

## 2018-01-14 LAB — CBC WITH DIFFERENTIAL/PLATELET
BASOS PCT: 0 %
Basophils Absolute: 0 10*3/uL (ref 0.0–0.1)
EOS ABS: 0.2 10*3/uL (ref 0.0–0.5)
Eosinophils Relative: 4 %
HEMATOCRIT: 38.3 % — AB (ref 38.4–49.9)
HEMOGLOBIN: 12.9 g/dL — AB (ref 13.0–17.1)
Lymphocytes Relative: 16 %
Lymphs Abs: 0.8 10*3/uL — ABNORMAL LOW (ref 0.9–3.3)
MCH: 39.2 pg — ABNORMAL HIGH (ref 27.2–33.4)
MCHC: 33.7 g/dL (ref 32.0–36.0)
MCV: 116.4 fL — ABNORMAL HIGH (ref 79.3–98.0)
MONOS PCT: 9 %
Monocytes Absolute: 0.4 10*3/uL (ref 0.1–0.9)
NEUTROS ABS: 3.5 10*3/uL (ref 1.5–6.5)
NEUTROS PCT: 71 %
Platelets: 132 10*3/uL — ABNORMAL LOW (ref 140–400)
RBC: 3.29 MIL/uL — ABNORMAL LOW (ref 4.20–5.82)
RDW: 15 % — AB (ref 11.0–14.6)
WBC: 4.9 10*3/uL (ref 4.0–10.3)

## 2018-01-14 LAB — LACTATE DEHYDROGENASE: LDH: 244 U/L — ABNORMAL HIGH (ref 98–192)

## 2018-01-14 NOTE — Telephone Encounter (Signed)
Appts already scheduled per 7/19 los.

## 2018-01-14 NOTE — Assessment & Plan Note (Addendum)
This is a very pleasant 47year old white male with paroxysmal nocturnal hemoglobinuria and currently on treatment with Soliris on days 1 and 15 every 4 weeks status post 41cycles. He tolerated the last cycle of his treatment well.He continues to have hyperbilirubinemia likely secondary to his disease.  The patient was seen with Dr. Julien Nordmann. His CBC remains stable.Bilirubin remains elevated at 2.9.  This is stable for the patient and is likely related to the disease process. We will continue to monitor this closely. LDH ispending. Recommend that he proceed with day 1 of cycle #42today as scheduled. He will return here every 2 weeks for lab work and will receive his Soliris at the infusion center every 2 weeks.  Follow-up visit will be in 4 weeks for evaluation prior to his next cycle treatment.  He was advised to call immediately if he has any concerning symptoms in the interval.  The patient voices understanding of current disease status and treatment options and is in agreement with the current care plan. All questions were answered. The patient knows to call the clinic with any problems, questions or concerns. We can certainly see the patient much sooner if necessary.

## 2018-01-14 NOTE — Progress Notes (Signed)
[facility] OFFICE PROGRESS NOTE  [name], MD [address] 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  PRIOR THERAPY: The patient was previously treated with a tapering dose of prednisone and high-dose IVIG for the hemolytic anemia before his diagnosis of paroxysmal nocturnal hemoglobinuria.  CURRENT THERAPY: Soliris (Eculizumab) initially with induction 600 MG IV weekly for 4 weeks and currently on maintenance treatment 900 MG IV every 2 weeks. First dose of treatment was given on 11/21/2014. Status post 41cycles.  INTERVAL HISTORY: Travis Mcdaniel 47 y.o. male returns for routine follow-up visit by himself.  The patient is feeling fine today and has no specific complaints.  He continues to receive his Soliris at an outpatient infusion center in [place].  He reports that he is tolerating it well.  He denies fevers and chills.  Denies chest pain, shortness of breath, cough, hemoptysis.  Denies nausea, vomiting, constipation, diarrhea.  Denies recent weight loss or night sweats.  Denies bleeding, bruising, ecchymosis.  The patient is here for evaluation prior to starting cycle #42 of his treatment.  MEDICAL HISTORY: Past Medical History:  Diagnosis Date  . Diverticulosis   . Encounter for antineoplastic chemotherapy 01/15/2016  . Hypertension     ALLERGIES:  has No Known Allergies.  MEDICATIONS:  Current Outpatient Medications  Medication Sig Dispense Refill  . ALPRAZolam (XANAX) 1 MG tablet Take 1 mg by mouth 3 (three) times daily.  2  . aspirin EC 81 MG tablet Take 81 mg by mouth.    . hydrochlorothiazide (HYDRODIURIL) 25 MG tablet Take by mouth.    [name] Kitchen lisinopril-hydrochlorothiazide (PRINZIDE,ZESTORETIC) 20-25 MG tablet Take 1 tablet by mouth daily.  1  . Multiple Vitamin (MULTIVITAMIN WITH MINERALS) TABS tablet Take 1 tablet by mouth daily.    . Psyllium  (METAMUCIL PO) Take 1 capsule by mouth daily.    . traMADol (ULTRAM) 50 MG tablet Take 1 tablet (50 mg total) 2 (two) times daily as needed by mouth. 30 tablet 0  . Vitamins/Minerals TABS Take by mouth.     No current facility-administered medications for this visit.     SURGICAL HISTORY: History reviewed. No pertinent surgical history.  REVIEW OF SYSTEMS:   Review of Systems  Constitutional: Negative for appetite change, chills, fatigue, fever and unexpected weight change.  HENT:   Negative for mouth sores, nosebleeds, sore throat and trouble swallowing.   Eyes: Negative for eye problems and icterus.  Respiratory: Negative for cough, hemoptysis, shortness of breath and wheezing.   Cardiovascular: Negative for chest pain and leg swelling.  Gastrointestinal: Negative for abdominal pain, constipation, diarrhea, nausea and vomiting.  Genitourinary: Negative for bladder incontinence, difficulty urinating, dysuria, frequency and hematuria.   Musculoskeletal: Negative for back pain, gait problem, neck pain and neck stiffness.  Skin: Negative for itching and rash.  Neurological: Negative for dizziness, extremity weakness, gait problem, headaches, light-headedness and seizures.  Hematological: Negative for adenopathy. Does not bruise/bleed easily.  Psychiatric/Behavioral: Negative for confusion, depression and sleep disturbance. The patient is not nervous/anxious.     PHYSICAL EXAMINATION:  Blood pressure 127/77, pulse 65, temperature 98.9 F (37.2 C), temperature source Oral, resp. rate 18, height 5\' 6"  (1.676 m), weight 166 lb 14.4 oz (75.7 kg), SpO2 100 %.  ECOG PERFORMANCE STATUS: 1 - Symptomatic but completely ambulatory  Physical Exam  Constitutional: Oriented to person, place, and time and well-developed, well-nourished, and in no distress. No distress.  HENT:  Head: Normocephalic and atraumatic.  Mouth/Throat: Oropharynx is clear and moist. No oropharyngeal exudate.  Eyes:  Conjunctivae are normal. Right eye exhibits no discharge. Left eye exhibits no discharge. No scleral icterus.  Neck: Normal range of motion. Neck supple.  Cardiovascular: Normal rate, regular rhythm, normal heart sounds and intact distal pulses.   Pulmonary/Chest: Effort normal and breath sounds normal. No respiratory distress. No wheezes. No rales.  Abdominal: Soft. Bowel sounds are normal. Exhibits no distension and no mass. There is no tenderness.  Musculoskeletal: Normal range of motion. Exhibits no edema.  Lymphadenopathy:    No cervical adenopathy.  Neurological: Alert and oriented to person, place, and time. Exhibits normal muscle tone. Gait normal. Coordination normal.  Skin: Skin is warm and dry. No rash noted. Not diaphoretic. No erythema. No pallor.  Psychiatric: Mood, memory and judgment normal.  Vitals reviewed.  LABORATORY DATA: Lab Results  Component Value Date   WBC 4.9 01/14/2018   HGB 12.9 (L) 01/14/2018   HCT 38.3 (L) 01/14/2018   MCV 116.4 (H) 01/14/2018   PLT 132 (L) 01/14/2018      Chemistry      Component Value Date/Time   NA 138 01/14/2018 0801   NA 136 06/28/2017 1236   K 3.8 01/14/2018 0801   K 4.1 06/28/2017 1236   CL 103 01/14/2018 0801   CO2 25 01/14/2018 0801   CO2 24 06/28/2017 1236   BUN 14 01/14/2018 0801   BUN 17.1 06/28/2017 1236   CREATININE 0.84 01/14/2018 0801   CREATININE 0.88 09/22/2017 1241   CREATININE 1.0 06/28/2017 1236      Component Value Date/Time   CALCIUM 9.4 01/14/2018 0801   CALCIUM 9.1 06/28/2017 1236   ALKPHOS 58 01/14/2018 0801   ALKPHOS 66 06/28/2017 1236   AST 53 (H) 01/14/2018 0801   AST 44 (H) 09/22/2017 1241   AST 40 (H) 06/28/2017 1236   ALT 76 (H) 01/14/2018 0801   ALT 64 (H) 09/22/2017 1241   ALT 60 (H) 06/28/2017 1236   BILITOT 2.9 (H) 01/14/2018 0801   BILITOT 2.6 (H) 09/22/2017 1241   BILITOT 2.87 (H) 06/28/2017 1236       RADIOGRAPHIC STUDIES:  No results found.   ASSESSMENT/PLAN:   Paroxysmal nocturnal hemoglobinuria (PNH) (HCC) This is a very pleasant 47year old white male with paroxysmal nocturnal hemoglobinuria and currently on treatment with Soliris on days 1 and 15 every 4 weeks status post 41cycles. He tolerated the last cycle of his treatment well.He continues to have hyperbilirubinemia likely secondary to his disease.  The patient was seen with Dr. Julien Nordmann. His CBC remains stable.Bilirubin remains elevated at 2.9.  This is stable for the patient and is likely related to the disease process. We will continue to monitor this closely. LDH ispending. Recommend that he proceed with day 1 of cycle #42today as scheduled. He will return here every 2 weeks for lab work and will receive his Soliris at the infusion center every 2 weeks.  Follow-up visit will be in 4 weeks for evaluation prior to his next cycle treatment.  He was advised to call immediately if he has any concerning symptoms in the interval.  The patient voices understanding of current disease status and treatment options and is in agreement with the current care plan. All questions were answered. The patient knows to call the clinic with any problems, questions or concerns. We can certainly see the patient much sooner if necessary.   No orders of the defined  types were placed in this encounter.  Mikey Bussing, DNP, AGPCNP-BC, AOCNP 01/14/18  ADDENDUM: Hematology/Oncology Attending: I had a face-to-face encounter with the patient today.  I recommended his care plan.  This is a very pleasant 47 years old white male with paroxysmal nocturnal hemoglobinuria.  He is currently on treatment with Solaris every 2 weeks status post 41 cycles.  He has been tolerating this treatment well with no concerning complaints except for the persistent mild anemia as well as hyper bilirubinemia.  He is currently receiving his treatment and outside infusion center because of the billing issues and requirement by his  insurance company. He is feeling fine today. I recommended for him to proceed with his treatment today as a schedule. He will come back for follow-up visit in 4 weeks for reevaluation before starting cycle #43. He was advised to call immediately if he has any concerning symptoms in the interval.  Disclaimer: This note was dictated with voice recognition software. Similar sounding words can inadvertently be transcribed and may be missed upon review. Eilleen Kempf, MD 01/14/18

## 2018-01-28 ENCOUNTER — Ambulatory Visit: Payer: PRIVATE HEALTH INSURANCE

## 2018-01-28 ENCOUNTER — Inpatient Hospital Stay: Payer: BLUE CROSS/BLUE SHIELD | Attending: Internal Medicine

## 2018-01-28 DIAGNOSIS — I10 Essential (primary) hypertension: Secondary | ICD-10-CM | POA: Insufficient documentation

## 2018-01-28 DIAGNOSIS — D595 Paroxysmal nocturnal hemoglobinuria [Marchiafava-Micheli]: Secondary | ICD-10-CM | POA: Diagnosis present

## 2018-01-28 LAB — CBC WITH DIFFERENTIAL/PLATELET
Basophils Absolute: 0 10*3/uL (ref 0.0–0.1)
Basophils Relative: 0 %
Eosinophils Absolute: 0.3 10*3/uL (ref 0.0–0.5)
Eosinophils Relative: 7 %
HEMATOCRIT: 37.2 % — AB (ref 38.4–49.9)
HEMOGLOBIN: 12.6 g/dL — AB (ref 13.0–17.1)
Lymphocytes Relative: 24 %
Lymphs Abs: 0.9 10*3/uL (ref 0.9–3.3)
MCH: 39.3 pg — AB (ref 27.2–33.4)
MCHC: 33.9 g/dL (ref 32.0–36.0)
MCV: 115.9 fL — ABNORMAL HIGH (ref 79.3–98.0)
Monocytes Absolute: 0.3 10*3/uL (ref 0.1–0.9)
Monocytes Relative: 7 %
Neutro Abs: 2.4 10*3/uL (ref 1.5–6.5)
Neutrophils Relative %: 62 %
Platelets: 139 10*3/uL — ABNORMAL LOW (ref 140–400)
RBC: 3.21 MIL/uL — AB (ref 4.20–5.82)
RDW: 14.6 % (ref 11.0–14.6)
WBC: 3.8 10*3/uL — AB (ref 4.0–10.3)

## 2018-01-28 LAB — COMPREHENSIVE METABOLIC PANEL
ALK PHOS: 58 U/L (ref 38–126)
ALT: 59 U/L — ABNORMAL HIGH (ref 0–44)
AST: 41 U/L (ref 15–41)
Albumin: 4.4 g/dL (ref 3.5–5.0)
Anion gap: 10 (ref 5–15)
BILIRUBIN TOTAL: 2.6 mg/dL — AB (ref 0.3–1.2)
BUN: 12 mg/dL (ref 6–20)
CALCIUM: 9.1 mg/dL (ref 8.9–10.3)
CHLORIDE: 104 mmol/L (ref 98–111)
CO2: 23 mmol/L (ref 22–32)
CREATININE: 0.83 mg/dL (ref 0.61–1.24)
GFR calc Af Amer: >60 mL/min (ref >60)
Glucose, Bld: 103 mg/dL — ABNORMAL HIGH (ref 70–99)
Potassium: 3.9 mmol/L (ref 3.5–5.1)
Sodium: 137 mmol/L (ref 135–145)
TOTAL PROTEIN: 7 g/dL (ref 6.5–8.1)

## 2018-01-28 LAB — LACTATE DEHYDROGENASE: LDH: 232 U/L — AB (ref 98–192)

## 2018-02-10 ENCOUNTER — Ambulatory Visit: Payer: PRIVATE HEALTH INSURANCE

## 2018-02-10 ENCOUNTER — Ambulatory Visit: Payer: PRIVATE HEALTH INSURANCE | Admitting: Internal Medicine

## 2018-02-10 ENCOUNTER — Other Ambulatory Visit: Payer: PRIVATE HEALTH INSURANCE

## 2018-02-11 ENCOUNTER — Inpatient Hospital Stay: Payer: BLUE CROSS/BLUE SHIELD | Admitting: Adult Health

## 2018-02-11 ENCOUNTER — Inpatient Hospital Stay: Payer: BLUE CROSS/BLUE SHIELD

## 2018-02-11 ENCOUNTER — Encounter: Payer: Self-pay | Admitting: Adult Health

## 2018-02-11 VITALS — BP 119/75 | HR 75 | Temp 98.7°F | Resp 18 | Ht 66.0 in | Wt 167.6 lb

## 2018-02-11 DIAGNOSIS — I10 Essential (primary) hypertension: Secondary | ICD-10-CM

## 2018-02-11 DIAGNOSIS — D595 Paroxysmal nocturnal hemoglobinuria [Marchiafava-Micheli]: Secondary | ICD-10-CM | POA: Diagnosis not present

## 2018-02-11 LAB — COMPREHENSIVE METABOLIC PANEL
ALBUMIN: 4.6 g/dL (ref 3.5–5.0)
ALT: 77 U/L — ABNORMAL HIGH (ref 0–44)
ANION GAP: 11 (ref 5–15)
AST: 53 U/L — ABNORMAL HIGH (ref 15–41)
Alkaline Phosphatase: 63 U/L (ref 38–126)
BUN: 15 mg/dL (ref 6–20)
CHLORIDE: 103 mmol/L (ref 98–111)
CO2: 26 mmol/L (ref 22–32)
Calcium: 9.2 mg/dL (ref 8.9–10.3)
Creatinine, Ser: 0.94 mg/dL (ref 0.61–1.24)
GFR calc Af Amer: >60 mL/min (ref >60)
GFR calc non Af Amer: >60 mL/min (ref >60)
GLUCOSE: 83 mg/dL (ref 70–99)
Potassium: 4 mmol/L (ref 3.5–5.1)
SODIUM: 140 mmol/L (ref 135–145)
Total Bilirubin: 3.4 mg/dL — ABNORMAL HIGH (ref 0.3–1.2)
Total Protein: 7.5 g/dL (ref 6.5–8.1)

## 2018-02-11 LAB — CBC WITH DIFFERENTIAL/PLATELET
BASOS ABS: 0 10*3/uL (ref 0.0–0.1)
Basophils Relative: 1 %
Eosinophils Absolute: 0.2 10*3/uL (ref 0.0–0.5)
Eosinophils Relative: 6 %
HEMATOCRIT: 40.1 % (ref 38.4–49.9)
Hemoglobin: 13.4 g/dL (ref 13.0–17.1)
LYMPHS PCT: 22 %
Lymphs Abs: 0.9 10*3/uL (ref 0.9–3.3)
MCH: 38.6 pg — ABNORMAL HIGH (ref 27.2–33.4)
MCHC: 33.4 g/dL (ref 32.0–36.0)
MCV: 115.6 fL — AB (ref 79.3–98.0)
MONO ABS: 0.4 10*3/uL (ref 0.1–0.9)
MONOS PCT: 10 %
NEUTROS ABS: 2.6 10*3/uL (ref 1.5–6.5)
Neutrophils Relative %: 61 %
Platelets: 136 10*3/uL — ABNORMAL LOW (ref 140–400)
RBC: 3.47 MIL/uL — ABNORMAL LOW (ref 4.20–5.82)
RDW: 14.3 % (ref 11.0–14.6)
WBC: 4.1 10*3/uL (ref 4.0–10.3)

## 2018-02-11 LAB — LACTATE DEHYDROGENASE: LDH: 227 U/L — ABNORMAL HIGH (ref 98–192)

## 2018-02-11 NOTE — Progress Notes (Signed)
[facility]:    Travis Reach, MD [address] [name] [address]   DIAGNOSIS: [place] presenting as hemolytic anemia in 11/2014  SUMMARY OF ONCOLOGIC HISTORY: The patient was previously treated with a tapering dose of prednisone and high-dose IVIG for the hemolytic anemia before his diagnosis of paroxysmal nocturnal hemoglobinuria.  CURRENT THERAPY:Soliris (Eculizumab) initially with induction 600 MG IV weekly for 4 weeks and currently on maintenance treatment 900 MG IV every 2 weeks. First dose of treatment was given on 11/21/2014.   INTERVAL HISTORY: Travis Mcdaniel 47 y.o. male returns for evaluation prior to receiving Soliris.  He says that he tolerates it very well.  He receives the treatment at [facility] in [place] due to insurance issues.  He says that it is quicker in [place] and he tolerates receiving the infusion very well.  He denies any side effects from the medication.  His CBC is stable today.     Patient Active Problem List   Diagnosis Date Noted  . Sinusitis 09/09/2016  . Encounter for antineoplastic chemotherapy 01/15/2016  . Paroxysmal nocturnal hemoglobinuria (PNH) ([place]) 11/12/2014  . Abscess 10/24/2014  . Hypertension 10/24/2014  . Hyperbilirubinemia 10/24/2014  . Anxiety 10/18/2014  . Thrombocytopenia ([place]) 10/12/2014  . Influenza A (H1N1) 09/06/2014  . Acute kidney injury ([place]) 09/05/2014  . UTI (lower urinary tract infection) 09/05/2014  . Alcohol dependence ([place]) 09/05/2014  . Diarrhea 09/05/2014  . Absolute anemia     has No Known Allergies.  MEDICAL HISTORY: Past Medical History:  Diagnosis Date  . Diverticulosis   . Encounter for antineoplastic chemotherapy 01/15/2016  . Hypertension     SURGICAL HISTORY: History reviewed. No pertinent surgical history.  SOCIAL HISTORY: Social History   Socioeconomic History  . Marital status: Single    Spouse name: Not on file  . Number of children: Not on  file  . Years of education: Not on file  . Highest education level: Not on file  Occupational History  . Not on file  Social Needs  . Financial resource strain: Not on file  . Food insecurity:    Worry: Not on file    Inability: Not on file  . Transportation needs:    Medical: Not on file    Non-medical: Not on file  Tobacco Use  . Smoking status: Never Smoker  . Smokeless tobacco: Never Used  Substance and Sexual Activity  . Alcohol use: Yes    Comment: [employer]/ [employer] weekend  . Drug use: No  . Sexual activity: Not on file  Lifestyle  . Physical activity:    Days per week: Not on file    Minutes per session: Not on file  . Stress: Not on file  Relationships  . Social connections:    Talks on phone: Not on file    Gets together: Not on file    Attends religious service: Not on file    Active member of club or organization: Not on file    Attends meetings of clubs or organizations: Not on file    Relationship status: Not on file  . Intimate partner violence:    Fear of current or ex partner: Not on file    Emotionally abused: Not on file    Physically abused: Not on file    Forced sexual activity: Not on file  Other Topics Concern  . Not on file  Social History Narrative  . Not on file    FAMILY HISTORY:  Family History  Problem Relation Age of Onset  . Alcohol abuse Brother   . Cirrhosis Brother        deceased    Review of Systems  Constitutional: Negative for appetite change, chills, fatigue, fever and unexpected weight change.  HENT:   Negative for hearing loss, lump/mass and trouble swallowing.   Eyes: Negative for eye problems and icterus.  Respiratory: Negative for chest tightness, cough and shortness of breath.   Cardiovascular: Negative for chest pain, leg swelling and palpitations.  Gastrointestinal: Negative for abdominal distention, abdominal pain, constipation, diarrhea, nausea and vomiting.  Endocrine: Negative for hot flashes.  Skin:  Negative for itching and rash.  Hematological: Negative for adenopathy. Does not bruise/bleed easily.  Psychiatric/Behavioral: Negative for depression. The patient is not nervous/anxious.       PHYSICAL EXAMINATION  ECOG PERFORMANCE STATUS: 0  Vitals:   02/11/18 0806  BP: 119/75  Pulse: 75  Resp: 18  Temp: 98.7 F (37.1 C)  SpO2: 100%    Physical Exam  Constitutional: He is oriented to person, place, and time. He appears well-developed and well-nourished.  HENT:  Head: Normocephalic and atraumatic.  Mouth/Throat: Oropharynx is clear and moist. No oropharyngeal exudate.  Eyes: Pupils are equal, round, and reactive to light. No scleral icterus.  Neck: Neck supple.  Cardiovascular: Normal rate, regular rhythm and normal heart sounds.  Pulmonary/Chest: Effort normal and breath sounds normal.  Abdominal: Soft. Bowel sounds are normal. He exhibits no distension and no mass. There is no tenderness. There is no rebound and no guarding.  Musculoskeletal: He exhibits no edema.  Lymphadenopathy:    He has no cervical adenopathy.  Neurological: He is alert and oriented to person, place, and time.  Skin: Skin is warm and dry. Capillary refill takes less than 2 seconds.  Psychiatric: He has a normal mood and affect.    LABORATORY DATA:  CBC    Component Value Date/Time   WBC 4.1 02/11/2018 0740   RBC 3.47 (L) 02/11/2018 0740   HGB 13.4 02/11/2018 0740   HGB 13.1 09/22/2017 1241   HGB 14.2 06/28/2017 1236   HCT 40.1 02/11/2018 0740   HCT 41.7 06/28/2017 1236   PLT 136 (L) 02/11/2018 0740   PLT 143 09/22/2017 1241   PLT 124 (L) 06/28/2017 1236   MCV 115.6 (H) 02/11/2018 0740   MCV 113.0 (H) 06/28/2017 1236   MCH 38.6 (H) 02/11/2018 0740   MCHC 33.4 02/11/2018 0740   RDW 14.3 02/11/2018 0740   RDW 14.6 06/28/2017 1236   LYMPHSABS 0.9 02/11/2018 0740   LYMPHSABS 1.1 06/28/2017 1236   MONOABS 0.4 02/11/2018 0740   MONOABS 0.6 06/28/2017 1236   EOSABS 0.2 02/11/2018 0740    EOSABS 0.2 06/28/2017 1236   BASOSABS 0.0 02/11/2018 0740   BASOSABS 0.0 06/28/2017 1236    CMP     Component Value Date/Time   NA 137 01/28/2018 0757   NA 136 06/28/2017 1236   K 3.9 01/28/2018 0757   K 4.1 06/28/2017 1236   CL 104 01/28/2018 0757   CO2 23 01/28/2018 0757   CO2 24 06/28/2017 1236   GLUCOSE 103 (H) 01/28/2018 0757   GLUCOSE 92 06/28/2017 1236   BUN 12 01/28/2018 0757   BUN 17.1 06/28/2017 1236   CREATININE 0.83 01/28/2018 0757   CREATININE 0.88 09/22/2017 1241   CREATININE 1.0 06/28/2017 1236   CALCIUM 9.1 01/28/2018 0757   CALCIUM 9.1 06/28/2017 1236   PROT 7.0 01/28/2018 0757   PROT  7.3 06/28/2017 1236   ALBUMIN 4.4 01/28/2018 0757   ALBUMIN 4.5 06/28/2017 1236   AST 41 01/28/2018 0757   AST 44 (H) 09/22/2017 1241   AST 40 (H) 06/28/2017 1236   ALT 59 (H) 01/28/2018 0757   ALT 64 (H) 09/22/2017 1241   ALT 60 (H) 06/28/2017 1236   ALKPHOS 58 01/28/2018 0757   ALKPHOS 66 06/28/2017 1236   BILITOT 2.6 (H) 01/28/2018 0757   BILITOT 2.6 (H) 09/22/2017 1241   BILITOT 2.87 (H) 06/28/2017 1236   GFRNONAA >60 01/28/2018 0757   GFRNONAA >60 09/22/2017 1241   GFRAA >60 01/28/2018 0757   GFRAA >60 09/22/2017 1241      ASSESSMENT and PLAN:   Paroxysmal nocturnal hemoglobinuria (PNH) (HCC) This is a very pleasant 47year old white male with paroxysmal nocturnal hemoglobinuria and currently on treatment with Soliris on days 1 and 15 every 4 weeks. He tolerated the last cycle of his treatment well.He continues to have hyperbilirubinemia likely secondary to his disease.  Baylee is doing well today.  He continues to tolerate his treatment well and his hemoglobin remains stable.  His T bili is pending today but in past it appears to continue to fluctuate, along with his LDH.  Most recently it was decreased.    Foye will proceed with treatment today.  He will return in 2 weeks for labs and in 4 weeks for labs and f/u with Mikey Bussing.    All questions  were answered. The patient knows to call the clinic with any problems, questions or concerns. We can certainly see the patient much sooner if necessary.  A total of (20) minutes of face-to-face time was spent with this patient with greater than 50% of that time in counseling and care-coordination.  This note was electronically signed. Scot Dock, NP 02/11/2018

## 2018-02-11 NOTE — Assessment & Plan Note (Signed)
This is a very pleasant 47year old white male with paroxysmal nocturnal hemoglobinuria and currently on treatment with Soliris on days 1 and 15 every 4 weeks. He tolerated the last cycle of his treatment well.He continues to have hyperbilirubinemia likely secondary to his disease.  Travis Mcdaniel is doing well today.  He continues to tolerate his treatment well and his hemoglobin remains stable.  His T bili is pending today but in past it appears to continue to fluctuate, along with his LDH.  Most recently it was decreased.    Travis Mcdaniel will proceed with treatment today.  He will return in 2 weeks for labs and in 4 weeks for labs and f/u with Mikey Bussing.

## 2018-02-14 ENCOUNTER — Telehealth: Payer: Self-pay | Admitting: Adult Health

## 2018-02-14 NOTE — Telephone Encounter (Signed)
Per 8/16 los, no new orders.

## 2018-02-25 ENCOUNTER — Inpatient Hospital Stay: Payer: BLUE CROSS/BLUE SHIELD

## 2018-02-25 DIAGNOSIS — D595 Paroxysmal nocturnal hemoglobinuria [Marchiafava-Micheli]: Secondary | ICD-10-CM | POA: Diagnosis not present

## 2018-02-25 LAB — COMPREHENSIVE METABOLIC PANEL
ALBUMIN: 4.4 g/dL (ref 3.5–5.0)
ALT: 78 U/L — ABNORMAL HIGH (ref 0–44)
ANION GAP: 11 (ref 5–15)
AST: 57 U/L — ABNORMAL HIGH (ref 15–41)
Alkaline Phosphatase: 63 U/L (ref 38–126)
BUN: 13 mg/dL (ref 6–20)
CO2: 24 mmol/L (ref 22–32)
Calcium: 9.4 mg/dL (ref 8.9–10.3)
Chloride: 106 mmol/L (ref 98–111)
Creatinine, Ser: 0.88 mg/dL (ref 0.61–1.24)
GFR calc non Af Amer: >60 mL/min (ref >60)
GLUCOSE: 100 mg/dL — AB (ref 70–99)
POTASSIUM: 4 mmol/L (ref 3.5–5.1)
SODIUM: 141 mmol/L (ref 135–145)
TOTAL PROTEIN: 7.1 g/dL (ref 6.5–8.1)
Total Bilirubin: 2.9 mg/dL — ABNORMAL HIGH (ref 0.3–1.2)

## 2018-02-25 LAB — CBC WITH DIFFERENTIAL/PLATELET
BASOS ABS: 0 10*3/uL (ref 0.0–0.1)
BASOS PCT: 0 %
Eosinophils Absolute: 0.2 10*3/uL (ref 0.0–0.5)
Eosinophils Relative: 6 %
HEMATOCRIT: 37.6 % — AB (ref 38.4–49.9)
HEMOGLOBIN: 13 g/dL (ref 13.0–17.1)
LYMPHS PCT: 21 %
Lymphs Abs: 0.8 10*3/uL — ABNORMAL LOW (ref 0.9–3.3)
MCH: 39.6 pg — ABNORMAL HIGH (ref 27.2–33.4)
MCHC: 34.5 g/dL (ref 32.0–36.0)
MCV: 114.9 fL — AB (ref 79.3–98.0)
MONOS PCT: 8 %
Monocytes Absolute: 0.3 10*3/uL (ref 0.1–0.9)
NEUTROS ABS: 2.5 10*3/uL (ref 1.5–6.5)
NEUTROS PCT: 65 %
Platelets: 121 10*3/uL — ABNORMAL LOW (ref 140–400)
RBC: 3.27 MIL/uL — ABNORMAL LOW (ref 4.20–5.82)
RDW: 14.8 % — ABNORMAL HIGH (ref 11.0–14.6)
WBC: 3.9 10*3/uL — ABNORMAL LOW (ref 4.0–10.3)

## 2018-02-25 LAB — LACTATE DEHYDROGENASE: LDH: 247 U/L — ABNORMAL HIGH (ref 98–192)

## 2018-03-11 ENCOUNTER — Telehealth: Payer: Self-pay | Admitting: Oncology

## 2018-03-11 ENCOUNTER — Inpatient Hospital Stay: Payer: BLUE CROSS/BLUE SHIELD | Attending: Internal Medicine | Admitting: Oncology

## 2018-03-11 ENCOUNTER — Encounter: Payer: Self-pay | Admitting: Oncology

## 2018-03-11 ENCOUNTER — Inpatient Hospital Stay: Payer: BLUE CROSS/BLUE SHIELD

## 2018-03-11 VITALS — BP 129/76 | HR 70 | Temp 97.8°F | Resp 18 | Ht 66.0 in | Wt 167.5 lb

## 2018-03-11 DIAGNOSIS — D595 Paroxysmal nocturnal hemoglobinuria [Marchiafava-Micheli]: Secondary | ICD-10-CM | POA: Insufficient documentation

## 2018-03-11 LAB — COMPREHENSIVE METABOLIC PANEL
ALBUMIN: 4.4 g/dL (ref 3.5–5.0)
ALK PHOS: 65 U/L (ref 38–126)
ALT: 94 U/L — AB (ref 0–44)
ANION GAP: 11 (ref 5–15)
AST: 75 U/L — ABNORMAL HIGH (ref 15–41)
BUN: 15 mg/dL (ref 6–20)
CALCIUM: 9.3 mg/dL (ref 8.9–10.3)
CO2: 24 mmol/L (ref 22–32)
CREATININE: 0.89 mg/dL (ref 0.61–1.24)
Chloride: 100 mmol/L (ref 98–111)
GFR calc Af Amer: >60 mL/min (ref >60)
GFR calc non Af Amer: >60 mL/min (ref >60)
GLUCOSE: 112 mg/dL — AB (ref 70–99)
Potassium: 3.7 mmol/L (ref 3.5–5.1)
SODIUM: 135 mmol/L (ref 135–145)
Total Bilirubin: 4.9 mg/dL (ref 0.3–1.2)
Total Protein: 7.3 g/dL (ref 6.5–8.1)

## 2018-03-11 LAB — CBC WITH DIFFERENTIAL/PLATELET
BASOS PCT: 0 %
Basophils Absolute: 0 10*3/uL (ref 0.0–0.1)
EOS PCT: 2 %
Eosinophils Absolute: 0.1 10*3/uL (ref 0.0–0.5)
HEMATOCRIT: 33.7 % — AB (ref 38.4–49.9)
Hemoglobin: 11.7 g/dL — ABNORMAL LOW (ref 13.0–17.1)
Lymphocytes Relative: 13 %
Lymphs Abs: 0.6 10*3/uL — ABNORMAL LOW (ref 0.9–3.3)
MCH: 40.3 pg — ABNORMAL HIGH (ref 27.2–33.4)
MCHC: 34.7 g/dL (ref 32.0–36.0)
MCV: 116.2 fL — ABNORMAL HIGH (ref 79.3–98.0)
MONO ABS: 0.6 10*3/uL (ref 0.1–0.9)
MONOS PCT: 13 %
NEUTROS ABS: 3.3 10*3/uL (ref 1.5–6.5)
Neutrophils Relative %: 72 %
Platelets: 124 10*3/uL — ABNORMAL LOW (ref 140–400)
RBC: 2.9 MIL/uL — ABNORMAL LOW (ref 4.20–5.82)
RDW: 15 % — AB (ref 11.0–14.6)
WBC: 4.6 10*3/uL (ref 4.0–10.3)

## 2018-03-11 LAB — LACTATE DEHYDROGENASE: LDH: 382 U/L — ABNORMAL HIGH (ref 98–192)

## 2018-03-11 NOTE — Telephone Encounter (Signed)
Scheduled appt per 9/13 los - gave patient AVS and calender per los.  

## 2018-03-11 NOTE — Assessment & Plan Note (Addendum)
This is a very pleasant 47year old white male with paroxysmal nocturnal hemoglobinuria and currently on treatment with Soliris on days 1 and 15 every 4 weeks status post41cycles. He tolerated the last cycle of his treatment well.He continues to have hyperbilirubinemia likely secondary to his disease. His CBC was reviewed with him today.  His hemoglobin has dropped to 11.7 but he states he stopped taking his iron tablets.  Recommend that he resume these. His chemistry is pending today. Recommend for him to proceed with his next cycle of Soliris today as scheduled at the infusion center in [place].  He will follow-up every 2 weeks for labs.  He will have a return visit in 4 weeks for evaluation prior to starting his next cycle of Soliris.  He was advised to call immediately if he has any concerning symptoms in the interval.

## 2018-03-11 NOTE — Progress Notes (Signed)
[facility] OFFICE PROGRESS NOTE  Travis Reach, MD [address]. 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  PRIOR THERAPY:The patient was previously treated with a tapering dose of prednisone and high-dose IVIG for the hemolytic anemia before his diagnosis of paroxysmal nocturnal hemoglobinuria.  CURRENT THERAPY:Soliris (Eculizumab) initially with induction 600 MG IV weekly for 4 weeks and currently on maintenance treatment 900 MG IV every 2 weeks. First dose of treatment was given on 11/21/2014. Status post43cycles.  INTERVAL HISTORY: Travis Mcdaniel 47 y.o. male returns for routine follow-up visit by himself.  The patient is feeling fine and has no specific complaints.  He continues to tolerate Soliris fairly well.  The patient denies fevers and chills.  Denies chest pain, shortness of breath, cough, hemoptysis.  Denies nausea, vomiting, constipation, diarrhea.  Denies recent weight loss or night sweats.  Denies bleeding.  He reports that he quit drinking all alcohol this past Monday.  The patient is here for evaluation prior to his next cycle of Soliris.  MEDICAL HISTORY: Past Medical History:  Diagnosis Date  . Diverticulosis   . Encounter for antineoplastic chemotherapy 01/15/2016  . Hypertension     ALLERGIES:  has No Known Allergies.  MEDICATIONS:  Current Outpatient Medications  Medication Sig Dispense Refill  . ALPRAZolam (XANAX) 1 MG tablet Take 1 mg by mouth 3 (three) times daily.  2  . aspirin EC 81 MG tablet Take 81 mg by mouth.    . hydrochlorothiazide (HYDRODIURIL) 25 MG tablet Take by mouth.    [name] Kitchen lisinopril-hydrochlorothiazide (PRINZIDE,ZESTORETIC) 20-25 MG tablet Take 1 tablet by mouth daily.  1  . Multiple Vitamin (MULTIVITAMIN WITH MINERALS) TABS tablet Take 1 tablet by mouth daily.    . Psyllium (METAMUCIL PO) Take 1 capsule by mouth daily.     . traMADol (ULTRAM) 50 MG tablet Take 1 tablet (50 mg total) 2 (two) times daily as needed by mouth. 30 tablet 0  . Vitamins/Minerals TABS Take by mouth.     No current facility-administered medications for this visit.     SURGICAL HISTORY: No past surgical history on file.  REVIEW OF SYSTEMS:   Review of Systems  Constitutional: Negative for appetite change, chills, fatigue, fever and unexpected weight change.  HENT:   Negative for mouth sores, nosebleeds, sore throat and trouble swallowing.   Eyes: Negative for eye problems and icterus.  Respiratory: Negative for cough, hemoptysis, shortness of breath and wheezing.   Cardiovascular: Negative for chest pain and leg swelling.  Gastrointestinal: Negative for abdominal pain, constipation, diarrhea, nausea and vomiting.  Genitourinary: Negative for bladder incontinence, difficulty urinating, dysuria, frequency and hematuria.   Musculoskeletal: Negative for back pain, gait problem, neck pain and neck stiffness.  Skin: Negative for itching and rash.  Neurological: Negative for dizziness, extremity weakness, gait problem, headaches, light-headedness and seizures.  Hematological: Negative for adenopathy. Does not bruise/bleed easily.  Psychiatric/Behavioral: Negative for confusion, depression and sleep disturbance. The patient is not nervous/anxious.     PHYSICAL EXAMINATION:  Blood pressure 129/76, pulse 70, temperature 97.8 F (36.6 C), temperature source Oral, resp. rate 18, height 5\' 6"  (1.676 m), weight 167 lb 8 oz (76 kg), SpO2 100 %.  ECOG PERFORMANCE STATUS: 1 - Symptomatic but completely ambulatory  Physical Exam  Constitutional: Oriented to person, place, and time and well-developed, well-nourished, and in no distress. No distress.  HENT:  Head: Normocephalic and atraumatic.  Mouth/Throat: Oropharynx is  clear and moist. No oropharyngeal exudate.  Eyes: Conjunctivae are normal. Right eye exhibits no discharge. Left eye exhibits  no discharge.   Neck: Normal range of motion. Neck supple.  Cardiovascular: Normal rate, regular rhythm, normal heart sounds and intact distal pulses.   Pulmonary/Chest: Effort normal and breath sounds normal. No respiratory distress. No wheezes. No rales.  Abdominal: Soft. Bowel sounds are normal. Exhibits no distension and no mass. There is no tenderness.  Musculoskeletal: Normal range of motion. Exhibits no edema.  Lymphadenopathy:    No cervical adenopathy.  Neurological: Alert and oriented to person, place, and time. Exhibits normal muscle tone. Gait normal. Coordination normal.  Skin: Skin is warm and dry. No rash noted. Not diaphoretic. No erythema. No pallor.  Psychiatric: Mood, memory and judgment normal.  Vitals reviewed.  LABORATORY DATA: Lab Results  Component Value Date   WBC 4.6 03/11/2018   HGB 11.7 (L) 03/11/2018   HCT 33.7 (L) 03/11/2018   MCV 116.2 (H) 03/11/2018   PLT 124 (L) 03/11/2018      Chemistry      Component Value Date/Time   NA 141 02/25/2018 0748   NA 136 06/28/2017 1236   K 4.0 02/25/2018 0748   K 4.1 06/28/2017 1236   CL 106 02/25/2018 0748   CO2 24 02/25/2018 0748   CO2 24 06/28/2017 1236   BUN 13 02/25/2018 0748   BUN 17.1 06/28/2017 1236   CREATININE 0.88 02/25/2018 0748   CREATININE 0.88 09/22/2017 1241   CREATININE 1.0 06/28/2017 1236      Component Value Date/Time   CALCIUM 9.4 02/25/2018 0748   CALCIUM 9.1 06/28/2017 1236   ALKPHOS 63 02/25/2018 0748   ALKPHOS 66 06/28/2017 1236   AST 57 (H) 02/25/2018 0748   AST 44 (H) 09/22/2017 1241   AST 40 (H) 06/28/2017 1236   ALT 78 (H) 02/25/2018 0748   ALT 64 (H) 09/22/2017 1241   ALT 60 (H) 06/28/2017 1236   BILITOT 2.9 (H) 02/25/2018 0748   BILITOT 2.6 (H) 09/22/2017 1241   BILITOT 2.87 (H) 06/28/2017 1236       RADIOGRAPHIC STUDIES:  No results found.   ASSESSMENT/PLAN:  Paroxysmal nocturnal hemoglobinuria (PNH) (HCC) This is a very pleasant 47year old white male with  paroxysmal nocturnal hemoglobinuria and currently on treatment with Soliris on days 1 and 15 every 4 weeks status post41cycles. He tolerated the last cycle of his treatment well.He continues to have hyperbilirubinemia likely secondary to his disease. His CBC was reviewed with him today.  His hemoglobin has dropped to 11.7 but he states he stopped taking his iron tablets.  Recommend that he resume these. His chemistry is pending today. Recommend for him to proceed with his next cycle of Soliris today as scheduled at the infusion center in [place].  He will follow-up every 2 weeks for labs.  He will have a return visit in 4 weeks for evaluation prior to starting his next cycle of Soliris.  He was advised to call immediately if he has any concerning symptoms in the interval.   No orders of the defined types were placed in this encounter.    Mikey Bussing, DNP, AGPCNP-BC, AOCNP 03/11/18

## 2018-03-11 NOTE — Telephone Encounter (Signed)
LVM for pt regarding appts per 9/13 sch message

## 2018-03-11 NOTE — Telephone Encounter (Signed)
CMET returned.  Total bilirubin today is 4.9.  Discussed with Dr. Earlie Server.  We will need to hold his treatment with Soliris today.  I called the patient and notified him of this.  He did not want to wait 2 weeks to have his labs rechecked and for his next dose layers.  We agreed that he will come in Monday morning for repeat CBC and CMET.  If his labs have improved, we will consider treating him with Soliris at that time.  I notifed the infusion center [facility] [phone]) of this information.

## 2018-03-14 ENCOUNTER — Inpatient Hospital Stay: Payer: BLUE CROSS/BLUE SHIELD

## 2018-03-14 ENCOUNTER — Telehealth: Payer: Self-pay | Admitting: Medical Oncology

## 2018-03-14 DIAGNOSIS — D595 Paroxysmal nocturnal hemoglobinuria [Marchiafava-Micheli]: Secondary | ICD-10-CM

## 2018-03-14 LAB — COMPREHENSIVE METABOLIC PANEL
ALT: 144 U/L — ABNORMAL HIGH (ref 0–44)
AST: 118 U/L — AB (ref 15–41)
Albumin: 4.3 g/dL (ref 3.5–5.0)
Alkaline Phosphatase: 61 U/L (ref 38–126)
Anion gap: 11 (ref 5–15)
BUN: 17 mg/dL (ref 6–20)
CHLORIDE: 100 mmol/L (ref 98–111)
CO2: 23 mmol/L (ref 22–32)
Calcium: 9.2 mg/dL (ref 8.9–10.3)
Creatinine, Ser: 0.91 mg/dL (ref 0.61–1.24)
GFR calc non Af Amer: >60 mL/min (ref >60)
Glucose, Bld: 114 mg/dL — ABNORMAL HIGH (ref 70–99)
POTASSIUM: 4 mmol/L (ref 3.5–5.1)
Sodium: 134 mmol/L — ABNORMAL LOW (ref 135–145)
Total Bilirubin: 4.2 mg/dL (ref 0.3–1.2)
Total Protein: 7.7 g/dL (ref 6.5–8.1)

## 2018-03-14 LAB — CBC WITH DIFFERENTIAL/PLATELET
BASOS ABS: 0 10*3/uL (ref 0.0–0.1)
Basophils Relative: 1 %
EOS ABS: 0.1 10*3/uL (ref 0.0–0.5)
EOS PCT: 2 %
HCT: 26.9 % — ABNORMAL LOW (ref 38.4–49.9)
Hemoglobin: 9.1 g/dL — ABNORMAL LOW (ref 13.0–17.1)
LYMPHS PCT: 24 %
Lymphs Abs: 1.4 10*3/uL (ref 0.9–3.3)
MCH: 38.6 pg — ABNORMAL HIGH (ref 27.2–33.4)
MCHC: 33.8 g/dL (ref 32.0–36.0)
MCV: 114 fL — AB (ref 79.3–98.0)
Monocytes Absolute: 0.7 10*3/uL (ref 0.1–0.9)
Monocytes Relative: 13 %
NEUTROS PCT: 60 %
Neutro Abs: 3.3 10*3/uL (ref 1.5–6.5)
PLATELETS: 133 10*3/uL — AB (ref 140–400)
RBC: 2.36 MIL/uL — AB (ref 4.20–5.82)
RDW: 15.4 % — AB (ref 11.0–14.6)
WBC: 5.6 10*3/uL (ref 4.0–10.3)
nRBC: 3 /100 WBC — ABNORMAL HIGH

## 2018-03-14 LAB — LACTATE DEHYDROGENASE: LDH: 662 U/L — ABNORMAL HIGH (ref 98–192)

## 2018-03-14 NOTE — Telephone Encounter (Signed)
Lab results- Per Julien Nordmann I reviewed labs with pt and told him he is hemolyzing and his disease is getting a little worse. He was instructed to go get his Solaris today and [facility] pharmacy notified. and to get tx today . Schedule message sent for f/u in two weeks. Disability-Pt request to start disability process. Referred to Lanelle Bal.

## 2018-03-16 ENCOUNTER — Telehealth: Payer: Self-pay | Admitting: *Deleted

## 2018-03-16 ENCOUNTER — Other Ambulatory Visit: Payer: Self-pay | Admitting: *Deleted

## 2018-03-16 ENCOUNTER — Encounter: Payer: Self-pay | Admitting: *Deleted

## 2018-03-16 DIAGNOSIS — D595 Paroxysmal nocturnal hemoglobinuria [Marchiafava-Micheli]: Secondary | ICD-10-CM

## 2018-03-16 NOTE — Telephone Encounter (Signed)
Pt requesting labs this week to check levels from recent solaris transfusion 9/16. Reviewed with provider, cmet, cbc, LD. Pt notified to come in for lab 9/20 8am. Message to scheduling

## 2018-03-16 NOTE — Progress Notes (Signed)
Lab apt 8am, orders entered.

## 2018-03-16 NOTE — Progress Notes (Signed)
[place] Work  Clinical Social Work was referred by medical oncology RN for assessment of psychosocial needs.  Clinical Social Worker contacted patient by phone  to offer support and assess for needs.   Patient interested in applying for social security disability.  CSW made referral to [facility] to assist in application process.     Gwinda Maine, LCSW  Clinical Social Worker [facility]

## 2018-03-18 ENCOUNTER — Observation Stay (HOSPITAL_COMMUNITY)
Admission: EM | Admit: 2018-03-18 | Discharge: 2018-03-19 | Disposition: A | Discharge status: Home / Self Care | Payer: BLUE CROSS/BLUE SHIELD | Attending: Internal Medicine | Admitting: Internal Medicine

## 2018-03-18 ENCOUNTER — Other Ambulatory Visit: Payer: Self-pay | Admitting: Internal Medicine

## 2018-03-18 ENCOUNTER — Encounter: Payer: Self-pay | Admitting: Internal Medicine

## 2018-03-18 ENCOUNTER — Other Ambulatory Visit: Payer: Self-pay

## 2018-03-18 ENCOUNTER — Telehealth: Payer: Self-pay | Admitting: Internal Medicine

## 2018-03-18 ENCOUNTER — Telehealth: Payer: Self-pay

## 2018-03-18 ENCOUNTER — Inpatient Hospital Stay: Payer: BLUE CROSS/BLUE SHIELD

## 2018-03-18 ENCOUNTER — Encounter (HOSPITAL_COMMUNITY): Payer: Self-pay | Admitting: *Deleted

## 2018-03-18 DIAGNOSIS — D595 Paroxysmal nocturnal hemoglobinuria [Marchiafava-Micheli]: Secondary | ICD-10-CM | POA: Diagnosis not present

## 2018-03-18 DIAGNOSIS — E876 Hypokalemia: Secondary | ICD-10-CM | POA: Diagnosis not present

## 2018-03-18 DIAGNOSIS — F419 Anxiety disorder, unspecified: Secondary | ICD-10-CM | POA: Diagnosis not present

## 2018-03-18 DIAGNOSIS — N179 Acute kidney failure, unspecified: Secondary | ICD-10-CM | POA: Insufficient documentation

## 2018-03-18 DIAGNOSIS — R531 Weakness: Secondary | ICD-10-CM | POA: Diagnosis present

## 2018-03-18 DIAGNOSIS — D598 Other acquired hemolytic anemias: Secondary | ICD-10-CM

## 2018-03-18 DIAGNOSIS — Z79899 Other long term (current) drug therapy: Secondary | ICD-10-CM | POA: Diagnosis not present

## 2018-03-18 DIAGNOSIS — Z7982 Long term (current) use of aspirin: Secondary | ICD-10-CM | POA: Insufficient documentation

## 2018-03-18 DIAGNOSIS — D589 Hereditary hemolytic anemia, unspecified: Secondary | ICD-10-CM | POA: Diagnosis present

## 2018-03-18 DIAGNOSIS — I10 Essential (primary) hypertension: Secondary | ICD-10-CM | POA: Diagnosis not present

## 2018-03-18 HISTORY — DX: Malignant (primary) neoplasm, unspecified: C80.1

## 2018-03-18 LAB — CBC WITH DIFFERENTIAL (CANCER CENTER ONLY)
BASOS ABS: 0 10*3/uL (ref 0.0–0.1)
BASOS PCT: 1 %
EOS PCT: 1 %
Eosinophils Absolute: 0 10*3/uL (ref 0.0–0.5)
HEMATOCRIT: 20.5 % — AB (ref 38.4–49.9)
Hemoglobin: 6.5 g/dL — CL (ref 13.0–17.1)
Lymphocytes Relative: 15 %
Lymphs Abs: 0.9 10*3/uL (ref 0.9–3.3)
MCH: 37.8 pg — AB (ref 27.2–33.4)
MCHC: 31.7 g/dL — AB (ref 32.0–36.0)
MCV: 119.2 fL — ABNORMAL HIGH (ref 79.3–98.0)
MONO ABS: 0.5 10*3/uL (ref 0.1–0.9)
MONOS PCT: 8 %
Neutro Abs: 4.8 10*3/uL (ref 1.5–6.5)
Neutrophils Relative %: 75 %
Platelet Count: 171 10*3/uL (ref 140–400)
RBC: 1.72 MIL/uL — ABNORMAL LOW (ref 4.20–5.82)
RDW: 21.2 % — AB (ref 11.0–14.6)
WBC Count: 6.3 10*3/uL (ref 4.0–10.3)
nRBC: 6 /100 WBC — ABNORMAL HIGH

## 2018-03-18 LAB — CMP (CANCER CENTER ONLY)
ALBUMIN: 4.1 g/dL (ref 3.5–5.0)
ALK PHOS: 60 U/L (ref 38–126)
ALT: 59 U/L — ABNORMAL HIGH (ref 0–44)
AST: 62 U/L — AB (ref 15–41)
Anion gap: 14 (ref 5–15)
BILIRUBIN TOTAL: 6.2 mg/dL — AB (ref 0.3–1.2)
BUN: 25 mg/dL — AB (ref 6–20)
CALCIUM: 9.2 mg/dL (ref 8.9–10.3)
CO2: 20 mmol/L — AB (ref 22–32)
Chloride: 101 mmol/L (ref 98–111)
Creatinine: 1.28 mg/dL — ABNORMAL HIGH (ref 0.61–1.24)
GFR, Est AFR Am: >60 mL/min (ref >60)
GFR, Estimated: >60 mL/min (ref >60)
GLUCOSE: 128 mg/dL — AB (ref 70–99)
Potassium: 3.3 mmol/L — ABNORMAL LOW (ref 3.5–5.1)
Sodium: 135 mmol/L (ref 135–145)
TOTAL PROTEIN: 7.1 g/dL (ref 6.5–8.1)

## 2018-03-18 LAB — CBC
HCT: 27.3 % — ABNORMAL LOW (ref 39.0–52.0)
Hemoglobin: 9 g/dL — ABNORMAL LOW (ref 13.0–17.0)
MCH: 34.4 pg — AB (ref 26.0–34.0)
MCHC: 33 g/dL (ref 30.0–36.0)
MCV: 104.2 fL — AB (ref 78.0–100.0)
PLATELETS: 154 10*3/uL (ref 150–400)
RBC: 2.62 MIL/uL — AB (ref 4.22–5.81)
RDW: 22.8 % — ABNORMAL HIGH (ref 11.5–15.5)
WBC: 5.6 10*3/uL (ref 4.0–10.5)

## 2018-03-18 LAB — PREPARE RBC (CROSSMATCH)

## 2018-03-18 LAB — LACTATE DEHYDROGENASE: LDH: 763 U/L — ABNORMAL HIGH (ref 98–192)

## 2018-03-18 LAB — SAMPLE TO BLOOD BANK

## 2018-03-18 MED ORDER — OXYCODONE HCL 5 MG PO TABS
5.0000 mg | ORAL_TABLET | Freq: Four times a day (QID) | ORAL | Status: DC | PRN
  Administered 2018-03-18 (×2): 5 mg via ORAL
  Filled 2018-03-18 (×2): qty 1

## 2018-03-18 MED ORDER — ACETAMINOPHEN 325 MG PO TABS: 325.0000 mg | ORAL_TABLET | Freq: Four times a day (QID) | ORAL | Status: DC | PRN

## 2018-03-18 MED ORDER — ALPRAZOLAM 1 MG PO TABS
1.0000 mg | ORAL_TABLET | Freq: Three times a day (TID) | ORAL | Status: DC
  Administered 2018-03-18 – 2018-03-19 (×3): 1 mg via ORAL
  Filled 2018-03-18 (×3): qty 1

## 2018-03-18 MED ORDER — PREDNISONE 50 MG PO TABS
80.0000 mg | ORAL_TABLET | Freq: Every day | ORAL | Status: DC
  Administered 2018-03-19: 80 mg via ORAL
  Filled 2018-03-18: qty 1

## 2018-03-18 MED ORDER — PREDNISONE 20 MG PO TABS
80.0000 mg | ORAL_TABLET | Freq: Once | ORAL | Status: AC
  Administered 2018-03-18: 80 mg via ORAL
  Filled 2018-03-18: qty 4

## 2018-03-18 MED ORDER — SODIUM CHLORIDE 0.9 % IV SOLN
INTRAVENOUS | Status: AC
  Administered 2018-03-18 – 2018-03-19 (×2): via INTRAVENOUS

## 2018-03-18 MED ORDER — ONDANSETRON HCL 4 MG/2ML IJ SOLN
4.0000 mg | Freq: Once | INTRAMUSCULAR | Status: AC
  Administered 2018-03-18: 4 mg via INTRAVENOUS
  Filled 2018-03-18: qty 2

## 2018-03-18 MED ORDER — ADULT MULTIVITAMIN W/MINERALS CH
1.0000 | ORAL_TABLET | Freq: Every day | ORAL | Status: DC
  Administered 2018-03-19: 1 via ORAL
  Filled 2018-03-18: qty 1

## 2018-03-18 MED ORDER — ENOXAPARIN SODIUM 40 MG/0.4ML ~~LOC~~ SOLN
40.0000 mg | SUBCUTANEOUS | Status: DC
  Filled 2018-03-18: qty 0.4

## 2018-03-18 MED ORDER — ALPRAZOLAM 0.5 MG PO TABS
1.0000 mg | ORAL_TABLET | Freq: Once | ORAL | Status: AC
  Administered 2018-03-18: 1 mg via ORAL
  Filled 2018-03-18: qty 2

## 2018-03-18 MED ORDER — POTASSIUM CHLORIDE CRYS ER 20 MEQ PO TBCR
40.0000 meq | EXTENDED_RELEASE_TABLET | Freq: Once | ORAL | Status: AC
  Administered 2018-03-18: 40 meq via ORAL
  Filled 2018-03-18: qty 2

## 2018-03-18 MED ORDER — ASPIRIN EC 81 MG PO TBEC
81.0000 mg | DELAYED_RELEASE_TABLET | Freq: Every day | ORAL | Status: DC
  Administered 2018-03-19: 81 mg via ORAL
  Filled 2018-03-18: qty 1

## 2018-03-18 MED ORDER — SODIUM CHLORIDE 0.9% IV SOLUTION
Freq: Once | INTRAVENOUS | Status: AC
  Administered 2018-03-18: 10:00:00 via INTRAVENOUS

## 2018-03-18 NOTE — Telephone Encounter (Signed)
Jerrye Beavers RN Charge at [facility] ED report on this patient per Dr. Julien Nordmann at 8:35. Patient was taken via wheelchair to ED.

## 2018-03-18 NOTE — ED Notes (Signed)
[facility] made aware of pt's order for a type and screen.

## 2018-03-18 NOTE — Progress Notes (Unsigned)
This is a very pleasant 47 years old white male with history of paroxysmal nocturnal hemoglobinuria that was diagnosed in June 2016 and has been on treatment with Solaris every 2 weeks since that time.  He has been tolerating the treatment well.  He is currently receiving his treatment at the infusion center in [facility] because of insurance coverage issues. He was found recently to have declining hemoglobin and hematocrit as well as elevated serum bilirubin and LDH indicative of worsening hemolysis.  Repeat CBC today showed hemoglobin down to 6.5 and hematocrit 20.5%.  I strongly recommend for the patient to go immediately to the emergency department for evaluation and probably admission secondary to his worsening hemolytic anemia.  The patient will need to start high-dose steroid with prednisone 1 mg/KG on daily basis with close monitoring of his blood count, LDH and liver enzymes. I will ask the oncologist covering the weekend to see the patient and help with his management. We will probably consider changing his treatment to Ultomiris (Ravulizumab).

## 2018-03-18 NOTE — ED Notes (Signed)
ED TO INPATIENT HANDOFF REPORT  Name/Age/Gender Travis Mcdaniel 47 y.o. male  Code Status Code Status History    Date Active Date Inactive Code Status Order ID Comments User Context   11/30/2014 0923 12/01/2014 0320 Full Code [id number]  Aletta Edouard, [place]   09/05/2014 1952 09/07/2014 1500 Full Code [id number]  Mendel Corning, MD Inpatient      Home/SNF/Other Home  Chief Complaint abnormal labs  Level of Care/Admitting Diagnosis ED Disposition    ED Disposition Condition [facility] Area: [facility] [100102]  Level of Care: Med-Surg [16]  Diagnosis: PNH (paroxysmal nocturnal hemoglobinuria) [facility]) [725366]  Admitting Physician: Caren Griffins [phone]  Attending Physician: Caren Griffins [phone]  Estimated length of stay: past midnight tomorrow  Certification:: I certify this patient will need inpatient services for at least 2 midnights  PT Class (Do Not Modify): Inpatient [101]  PT Acc Code (Do Not Modify): Private [1]       Medical History Past Medical History:  Diagnosis Date  . Cancer (HCC)    PNH (Bone marrow)  . Diverticulosis   . Encounter for antineoplastic chemotherapy 01/15/2016  . Hypertension     Allergies No Known Allergies  IV Location/Drains/Wounds Patient Lines/Drains/Airways Status   Active Line/Drains/Airways    Name:   Placement date:   Placement time:   Site:   Days:   Peripheral IV 03/18/18 Left Antecubital   03/18/18    0951    Antecubital   less than 1          Labs/Imaging Results for orders placed or performed during the hospital encounter of 03/18/18 (from the past 48 hour(s))  Type and screen [place]     Status: None (Preliminary result)   Collection Time: 03/18/18  8:00 AM  Result Value Ref Range   ABO/RH(D) B POS    Antibody Screen NEG    Sample Expiration 03/21/2018    Unit Number [id number]    Blood Component Type RED CELLS,LR    Unit division 00    Status  of Unit ALLOCATED    Transfusion Status OK TO TRANSFUSE    Crossmatch Result      Compatible Performed at [facility], [place] [address], [place], [place] [address]    Unit Number [id number]    Blood Component Type RED CELLS,LR    Unit division 00    Status of Unit ALLOCATED    Transfusion Status OK TO TRANSFUSE    Crossmatch Result Compatible   Prepare RBC     Status: None   Collection Time: 03/18/18  8:00 AM  Result Value Ref Range   Order Confirmation      ORDER PROCESSED BY BLOOD BANK Performed at [facility], [place] [address] [place], [place] [address]    No results found.  Pending Labs [employer] (From admission, onward)    Start     Ordered   Signed and Held  HIV antibody (Routine Testing)  Once,   R     Signed and Held   Signed and Held  Comprehensive metabolic panel  Tomorrow morning,   R     Signed and Held   Signed and Held  CBC with Differential/Platelet  Tomorrow morning,   R     Signed and Held   Signed and Held  Lactate dehydrogenase  Tomorrow morning,   R     Signed and Held  Vitals/Pain Today's Vitals   03/18/18 0843 03/18/18 0903 03/18/18 1000  BP: 95/61  107/66  Pulse: (!) 101  97  Resp: 17  17  Temp: 98 F (36.7 C)    TempSrc: Oral    SpO2: 100%  100%  Weight: 75.8 kg 75.7 kg   Height: 5\' 6"  (1.676 m) 5\' 6"  (1.676 m)   PainSc:  0-No pain     Isolation Precautions No active isolations  Medications Medications  0.9 %  sodium chloride infusion (has no administration in time range)  predniSONE (DELTASONE) tablet 80 mg (80 mg Oral Given 03/18/18 0937)  0.9 %  sodium chloride infusion (Manually program via Guardrails IV Fluids) ( Intravenous New Bag/Given 03/18/18 0952)  ALPRAZolam Duanne Moron) tablet 1 mg (1 mg Oral Given 03/18/18 0937)  potassium chloride SA (K-DUR,KLOR-CON) CR tablet 40 mEq (40 mEq Oral Given 03/18/18 1036)  ondansetron (ZOFRAN) injection 4 mg (4 mg Intravenous Given 03/18/18  1035)    Mobility walks

## 2018-03-18 NOTE — Telephone Encounter (Signed)
Patient walked in this morning for appointment. No appointments scheduled - per patient he was told to come in today for appointment.   Lab add on scheduled for today based on telephone documentation from 9/18 by desk nurse. No schedule message found to be sent to the scheduling inbasket. This message forwarded to MM/desk nurse/APP in the event that patient needs any further appointments.

## 2018-03-18 NOTE — ED Provider Notes (Addendum)
[facility] DEPT Provider Note   CSN: [id number] Arrival date & time: 03/18/18  0814     History   Chief Complaint Chief Complaint  Patient presents with  . Abnormal Lab    HPI Travis Mcdaniel is a 47 y.o. male.  HPI 47 year old male with history of paroxysmal nocturnal hemoglobinuria, currently on Solaris, here with generalized weakness.  The patient states that for the last week, he has felt generally and progressively more weak.  He has had darkened urine in the mornings, which is abnormal for him since he has been on his medications.  Denies any preceding illnesses.  He has not changed his regimen of medications.  He states that over the last week, he said progressive worsening dyspnea with exertion and general fatigue.  Is also had associated nausea over the last several days and has had poor appetite.  Denies any fevers.  Denies any overt abdominal pain.  No shortness of breath at rest.  He does note that he can walk only 100 or so feet before getting dyspneic, however.  Denies any chest pain.  Denies any vomiting or diarrhea.  No rash.  Past Medical History:  Diagnosis Date  . Cancer (HCC)    PNH (Bone marrow)  . Diverticulosis   . Encounter for antineoplastic chemotherapy 01/15/2016  . Hypertension     Patient Active Problem List   Diagnosis Date Noted  . Sinusitis 09/09/2016  . Encounter for antineoplastic chemotherapy 01/15/2016  . Paroxysmal nocturnal hemoglobinuria (PNH) ([place]) 11/12/2014  . Abscess 10/24/2014  . Hypertension 10/24/2014  . Hyperbilirubinemia 10/24/2014  . Anxiety 10/18/2014  . Thrombocytopenia ([place]) 10/12/2014  . Influenza A (H1N1) 09/06/2014  . Acute kidney injury ([place]) 09/05/2014  . UTI (lower urinary tract infection) 09/05/2014  . Alcohol dependence ([place]) 09/05/2014  . Diarrhea 09/05/2014  . Absolute anemia     History reviewed. No pertinent surgical history.      Home Medications    Prior to  Admission medications   Medication Sig Start Date End Date Taking? Authorizing Provider  acetaminophen (TYLENOL) 325 MG tablet Take 325-650 mg by mouth every 6 (six) hours as needed for mild pain.   Yes [provider]  ALPRAZolam Duanne Moron) 1 MG tablet Take 1 mg by mouth 3 (three) times daily. 10/09/15  Yes [provider]  aspirin EC 81 MG tablet Take 81 mg by mouth.   Yes [provider]  lisinopril-hydrochlorothiazide (PRINZIDE,ZESTORETIC) 20-25 MG tablet Take 1 tablet by mouth daily. 08/13/16  Yes [provider]  Multiple Vitamin (MULTIVITAMIN WITH MINERALS) TABS tablet Take 1 tablet by mouth daily.   Yes [provider]  Psyllium (METAMUCIL PO) Take 1 capsule by mouth daily.   Yes [provider]  tetrahydrozoline 0.05 % ophthalmic solution Place 1 drop into both eyes 2 (two) times daily as needed (eye irritation).   Yes [provider]  traMADol (ULTRAM) 50 MG tablet Take 1 tablet (50 mg total) 2 (two) times daily as needed by mouth. Patient not taking: Reported on 03/18/2018 05/05/17   Maryanna Shape, NP    Family History Family History  Problem Relation Age of Onset  . Alcohol abuse Brother   . Cirrhosis Brother        deceased    Social History Social History   Tobacco Use  . Smoking status: Never Smoker  . Smokeless tobacco: Never Used  Substance Use Topics  . Alcohol use: Yes    Comment: odouls/  budlight weekend  . Drug use: No     Allergies   Patient has no known allergies.   Review of Systems Review of Systems  Constitutional: Positive for fatigue. Negative for chills and fever.  HENT: Negative for congestion and rhinorrhea.   Eyes: Negative for visual disturbance.  Respiratory: Positive for shortness of breath. Negative for cough and wheezing.   Cardiovascular: Negative for chest pain and leg swelling.  Gastrointestinal: Positive for nausea. Negative for abdominal pain, diarrhea and vomiting.    Genitourinary: Negative for dysuria and flank pain.  Musculoskeletal: Negative for neck pain and neck stiffness.  Skin: Negative for rash and wound.  Allergic/Immunologic: Negative for immunocompromised state.  Neurological: Positive for weakness. Negative for syncope and headaches.  All other systems reviewed and are negative.    Physical Exam Updated Vital Signs BP 95/61 (BP Location: Right Arm)   Pulse (!) 101   Temp 98 F (36.7 C) (Oral)   Resp 17   Ht 5\' 6"  (1.676 m)   Wt 75.7 kg   SpO2 100%   BMI 26.94 kg/m   Physical Exam  Constitutional: He is oriented to person, place, and time. He appears well-developed and well-nourished. No distress.  HENT:  Head: Normocephalic and atraumatic.  Eyes: Conjunctivae are normal. Scleral icterus is present.  Neck: Neck supple.  Cardiovascular: Normal rate, regular rhythm and normal heart sounds. Exam reveals no friction rub.  No murmur heard. Pulmonary/Chest: Effort normal and breath sounds normal. No respiratory distress. He has no wheezes. He has no rales.  Abdominal: Soft. He exhibits no distension.  Musculoskeletal: He exhibits no edema.  Neurological: He is alert and oriented to person, place, and time. He exhibits normal muscle tone.  Skin: Skin is warm. Capillary refill takes less than 2 seconds.  Psychiatric: He has a normal mood and affect.  Nursing note and vitals reviewed.    ED Treatments / Results  Labs (all labs ordered are listed, but only abnormal results are displayed) Labs Reviewed  TYPE AND SCREEN  PREPARE RBC (CROSSMATCH)    EKG None  Radiology No results found.  Procedures .Critical Care Performed by: Duffy Bruce, MD Authorized by: Duffy Bruce, MD   Critical care provider statement:    Critical care time (minutes):  35   Critical care time was exclusive of:  Separately billable procedures and treating other patients and teaching time   Critical care was necessary to treat or prevent  imminent or life-threatening deterioration of the following conditions:  Cardiac failure and circulatory failure   Critical care was time spent personally by me on the following activities:  Development of treatment plan with patient or surrogate, discussions with consultants, evaluation of patient's response to treatment, examination of patient, obtaining history from patient or surrogate, ordering and performing treatments and interventions, ordering and review of laboratory studies, ordering and review of radiographic studies, pulse oximetry, re-evaluation of patient's condition and review of old charts   I assumed direction of critical care for this patient from another provider in my specialty: no     (including critical care time)  Medications Ordered in ED Medications  0.9 %  sodium chloride infusion (Manually program via Guardrails IV Fluids) (has no administration in time range)  predniSONE (DELTASONE) tablet 80 mg (80 mg Oral Given 03/18/18 0937)  ALPRAZolam Duanne Moron) tablet 1 mg (1 mg Oral Given 03/18/18 0937)     Initial Impression / Assessment and Plan / ED Course  I have reviewed the triage vital  signs and the nursing notes.  Pertinent labs & imaging results that were available during my care of the patient were reviewed by me and considered in my medical decision making (see chart for details).     47 year old male here with generalized weakness and dyspnea with exertion.  History of PNH.  Lab work from today shows hemoglobin of 6.5, which is decreased from 9.14 days ago.  I suspect this is due to active hemolysis.  CMP shows mild AKI and elevated bilirubin.  LDH is pending.  Per review of Dr. Worthy Flank notes, will start transfusion and prednisone 1 make per cake.  Will plan to admit given significant worsening anemia with active hemolysis and dehydration.  Final Clinical Impressions(s) / ED Diagnoses   Final diagnoses:  Anemia due to paroxysmal nocturnal hemoglobinuria (PNH)  ([place])  Hypokalemia    ED Discharge Orders    None       Duffy Bruce, MD 03/18/18 0945    Duffy Bruce, MD 03/18/18 [phone]

## 2018-03-18 NOTE — H&P (Signed)
History and Physical   Attending MD note  Patient was seen, examined,treatment plan was discussed with the PA-S.  I have personally reviewed the clinical findings, lab, imaging studies and management of this patient in detail. I agree with the documentation, as recorded by the PA-S  Patient is a pleasant 47 year old male with history of paroxysmal nocturnal hemoglobinuria, anxiety, followed by oncology as an outpatient, who presents to the hospital after being called at home due to low hemoglobin.  Patient tells me that about 9 days ago he started having URI type symptoms and felt like he had the flu.  He was experiencing diffuse muscle aches, rhinorrhea, and generalized weakness.  His URI symptoms have resolved for most part, however still complains of ongoing fatigue and weakness.  He felt like something was wrong and called his oncologist.  He had blood work done this morning, and results showed a hemoglobin of 6.5 and he was directed to come to the emergency room.  In addition to weakness he is also complaining of a headache as well as back pain, and this is chronic.  He denies any fever or chills currently, denies any abdominal pain, no nausea or vomiting.  He has no chest pain.  He has no shortness of breath.  In the emergency room his vital signs are stable, his labs showed slightly elevated creatinine to 1.28, hemoglobin was 6.5.  He was ordered 2 units of packed red blood cells as well as 80 mg of prednisone at oncology recommendations and we were asked to admit.  BP (!) 103/59   Pulse 89   Temp 98.4 F (36.9 C) (Oral)   Resp 20   Ht 5\' 6"  (1.676 m)   Wt 75.7 kg   SpO2 99%   BMI 26.94 kg/m  On Exam:  Constitutional: NAD, calm, comfortable Eyes: PERRL, lids and conjunctivae icteric ENMT: Mucous membranes are moist.  Neck: normal, supple Respiratory: clear to auscultation bilaterally, no wheezing, no crackles. Normal respiratory effort.  Cardiovascular: Regular rate and rhythm, no  murmurs / rubs / gallops. No LE edema. 2+ pedal pulses.  Abdomen: no tenderness. Bowel sounds positive.  Skin: no rashes, lesions, ulcers. No induration Neurologic: non focal   Plan  PNH / acute hemolytic anemia -Discussed with patient's oncologist Dr. Earlie Server, will transfuse couple units of packed red blood cells and start patient on high-dose prednisone 1 mg/kg -He may need to have his therapy switched as an outpatient -Oncology will follow tomorrow, for now monitor counts, monitor LDH and bilirubin -Likely had influenza-like illness last week, his symptoms are now completely resolved, ?  Possibly triggered this -No evidence of bleeding  Acute kidney injury -Likely in the setting of anemia but also possibly related to [place], give IV fluids and repeat labs in the morning   Rest as below  Travis Michalowski M. Cruzita Lederer, MD [facility] [phone]  If 7PM-7AM, please contact night-coverage [email or web address] Password [email or web address]    Travis Mcdaniel:[id number] DOB: 17-Jun-1971 DOA: 03/18/2018  I have briefly reviewed the patient's prior medical records in [place]  PCP: [name], MD  Patient coming from:   Chief Complaint: Patient presents per recommendation from oncologist  HPI: Travis Mcdaniel is a 47 y.o. male with medical history significant of paroxysmal nocturnal hemoglobinuria (PNH) and HTN. Patient was prompted to come to ED per oncologist after repeat CBC demonstrated Hgb of 6.5 and HCT 20.5. Patient states that he began feeling ill nine days ago (03/09/18). He reports intermittent  fever, chills, headache, loose stools 2 days ago, and nausea that has led to diminished appetite. Patient attributes these symptoms to the flu. He also reports fatigue, dyspnea with exertion, back pain, and dysphagia which he attributes to his PNH. He denies any vomiting, melena, hematochezia, cough, or abdominal pain. Patient states that his urine was dark this morning, but that it hasn't  otherwise been an issue and that he hydrates well. He states that he has not taken his tramadol in fear that it may have been contributing to his nausea and instead has taken a couple acetaminophen which he knows is not advised, per his oncologist. Patient takes aspirin each morning with his antihypertensives.  ED Course: Patient received IVF 0.9% sodium chloride, ondansetron, potassium chloride PO, and prednisone.   Review of Systems: As per HPI otherwise 10 point review of systems negative.   Past Medical History:  Diagnosis Date  . Cancer (HCC)    PNH (Bone marrow)  . Diverticulosis   . Encounter for antineoplastic chemotherapy 01/15/2016  . Hypertension     History reviewed. No pertinent surgical history.   reports that he has never smoked. He has never used smokeless tobacco. He reports that he drinks alcohol. He reports that he does not use drugs.  No Known Allergies  Family History  Problem Relation Age of Onset  . Alcohol abuse Brother   . Cirrhosis Brother        deceased    Prior to Admission medications   Medication Sig Start Date End Date Taking? Authorizing Provider  acetaminophen (TYLENOL) 325 MG tablet Take 325-650 mg by mouth every 6 (six) hours as needed for mild pain.   Yes [provider]  ALPRAZolam Duanne Moron) 1 MG tablet Take 1 mg by mouth 3 (three) times daily. 10/09/15  Yes [provider]  aspirin EC 81 MG tablet Take 81 mg by mouth.   Yes [provider]  lisinopril-hydrochlorothiazide (PRINZIDE,ZESTORETIC) 20-25 MG tablet Take 1 tablet by mouth daily. 08/13/16  Yes [provider]  Multiple Vitamin (MULTIVITAMIN WITH MINERALS) TABS tablet Take 1 tablet by mouth daily.   Yes [provider]  Psyllium (METAMUCIL PO) Take 1 capsule by mouth daily.   Yes [provider]  tetrahydrozoline 0.05 % ophthalmic solution Place 1 drop into both eyes 2 (two) times daily as needed (eye irritation).   Yes [provider]  traMADol (ULTRAM) 50 MG tablet Take 1 tablet (50 mg total) 2 (two) times daily as needed by mouth. Patient not taking: Reported on 03/18/2018 05/05/17   Maryanna Shape, NP    Physical Exam: Vitals:   03/18/18 0843 03/18/18 0903 03/18/18 1000  BP: 95/61  107/66  Pulse: (!) 101  97  Resp: 17  17  Temp: 98 F (36.7 C)    TempSrc: Oral    SpO2: 100%  100%  Weight: 75.8 kg 75.7 kg   Height: 5\' 6"  (1.676 m) 5\' 6"  (1.676 m)       Constitutional: Pleasant, slightly jaundiced, WNWD Eyes: PERRL, scleral icterus  Respiratory: clear to auscultation bilaterally, no wheezing, no crackles. Normal respiratory effort. No accessory muscle use.  Cardiovascular: Regular rate and rhythm, no murmurs / rubs / gallops. No extremity edema. 2+ pedal pulses.  Abdomen: no tenderness, no masses palpated. Bowel sounds positive.  Musculoskeletal: no clubbing / cyanosis. Normal muscle tone.  Skin: no rashes, lesions, ulcers. No induration. Slightly jaundiced appearance Neurologic: CN 2-12 grossly intact. Strength 5/5 in all 4.  Psychiatric: Normal judgment and insight. Alert and oriented x 3. Normal mood.   Labs on Admission: I have personally reviewed following labs and imaging studies  CBC: Recent Labs  Lab 03/14/18 0800 03/18/18 0800  WBC 5.6 6.3  NEUTROABS 3.3 4.8  HGB 9.1* 6.5*  HCT 26.9* 20.5*  MCV 114.0* 119.2*  PLT 133* 628   Basic Metabolic Panel: Recent Labs  Lab 03/14/18 0800 03/18/18 0800  NA 134* 135  K 4.0 3.3*  CL 100 101  CO2 23 20*  GLUCOSE 114* 128*  BUN 17 25*  CREATININE 0.91 1.28*  CALCIUM 9.2 9.2   GFR: Estimated Creatinine Clearance: 63 mL/min (A) (by C-G formula based on SCr of 1.28 mg/dL (H)). Liver Function Tests: Recent Labs  Lab 03/14/18 0800 03/18/18 0800  AST 118* 62*  ALT 144* 59*  ALKPHOS 61 60  BILITOT 4.2* 6.2*  PROT 7.7 7.1  ALBUMIN 4.3 4.1   No results for input(s): LIPASE, AMYLASE in the last 168 hours. No results for  input(s): AMMONIA in the last 168 hours. Coagulation Profile: No results for input(s): INR, PROTIME in the last 168 hours. Cardiac Enzymes: No results for input(s): CKTOTAL, CKMB, CKMBINDEX, TROPONINI in the last 168 hours. BNP (last 3 results) No results for input(s): PROBNP in the last 8760 hours. HbA1C: No results for input(s): HGBA1C in the last 72 hours. CBG: No results for input(s): GLUCAP in the last 168 hours. Lipid Profile: No results for input(s): CHOL, HDL, LDLCALC, TRIG, CHOLHDL, LDLDIRECT in the last 72 hours. Thyroid Function Tests: No results for input(s): TSH, T4TOTAL, FREET4, T3FREE, THYROIDAB in the last 72 hours. Anemia Panel: No results for input(s): VITAMINB12, FOLATE, FERRITIN, TIBC, IRON, RETICCTPCT in the last 72 hours. Urine analysis:    Component Value Date/Time   COLORURINE RED (A) 09/05/2014 1446   APPEARANCEUR TURBID (A) 09/05/2014 1446   LABSPEC 1.005 11/12/2014 1434   PHURINE 6.5 11/12/2014 1434   PHURINE 5.0 09/05/2014 1446   GLUCOSEU Negative 11/12/2014 1434   HGBUR Negative 11/12/2014 1434   HGBUR LARGE (A) 09/05/2014 1446   BILIRUBINUR Negative 11/12/2014 1434   KETONESUR Negative 11/12/2014 1434   KETONESUR >80 (A) 09/05/2014 1446   PROTEINUR Negative 11/12/2014 1434   PROTEINUR >300 (A) 09/05/2014 1446   UROBILINOGEN 0.2 11/12/2014 1434   NITRITE Negative 11/12/2014 1434   NITRITE POSITIVE (A) 09/05/2014 1446   LEUKOCYTESUR Negative 11/12/2014 1434     Radiological Exams on Admission: No results found.   Assessment/Plan Active Problems:   PNH (paroxysmal nocturnal hemoglobinuria) (HCC)  Anemia in setting of paroxysmal nocturnal hemoglobinuria (PNH) Patient Hgb of 6.5 and HCT of 20.5 likely attributable to worsening hemolytic anemia. Elevated bilirubin. LDH 763. - Started IV sodium chloride 0.9% - Prednisone 80 mg PO - Transfuse 2 units RBC - Close monitoring of blood count, LDH, liver enzymes  Mild acute kidney injury  (AKI) SCr increased to 1.28 from 0.91, BUN increased to 25.  - Will continue to monitor  Hypokalemia Patient arrived with potassium of 3.3 - K-dur 40 mg PO   DVT prophylaxis: Lovenox Code Status: Full-code Family Communication: None at bedside Disposition Plan: Will admit for observation Consults called: None    Admission status: Admit  At the point of initial evaluation, it is my clinical opinion that admission for OBSERVATION is reasonable and necessary because the patient's presenting complaints in the context of their chronic conditions represent sufficient risk of deterioration or significant morbidity to constitute reasonable grounds for close observation  in the hospital setting, but that the patient may be medically stable for discharge from the hospital within 24 to 48 hours.    Marzetta Board, MD [facility] Pager [phone]  If 7PM-7AM, please contact night-coverage [email or web address] Password [facility]  03/18/2018, 10:56 AM

## 2018-03-18 NOTE — ED Triage Notes (Signed)
Pt coming from cancer center with [place].  Pt had an infusion on Monday.  Pt got blood work today and was told to come to hospital for blood and steroids. Pt reports SOB on exertion. Pt has no appetite. Pt took his BP medications today.

## 2018-03-19 DIAGNOSIS — D595 Paroxysmal nocturnal hemoglobinuria [Marchiafava-Micheli]: Principal | ICD-10-CM

## 2018-03-19 DIAGNOSIS — D599 Acquired hemolytic anemia, unspecified: Secondary | ICD-10-CM

## 2018-03-19 DIAGNOSIS — E876 Hypokalemia: Secondary | ICD-10-CM | POA: Diagnosis not present

## 2018-03-19 DIAGNOSIS — D598 Other acquired hemolytic anemias: Secondary | ICD-10-CM | POA: Diagnosis not present

## 2018-03-19 LAB — CBC WITH DIFFERENTIAL/PLATELET
Basophils Absolute: 0 10*3/uL (ref 0.0–0.1)
Basophils Relative: 0 %
EOS ABS: 0 10*3/uL (ref 0.0–0.7)
EOS PCT: 0 %
HCT: 29.1 % — ABNORMAL LOW (ref 39.0–52.0)
Hemoglobin: 9.3 g/dL — ABNORMAL LOW (ref 13.0–17.0)
Lymphocytes Relative: 18 %
Lymphs Abs: 1.4 10*3/uL (ref 0.7–4.0)
MCH: 33.7 pg (ref 26.0–34.0)
MCHC: 32 g/dL (ref 30.0–36.0)
MCV: 105.4 fL — ABNORMAL HIGH (ref 78.0–100.0)
MONO ABS: 0.9 10*3/uL (ref 0.1–1.0)
Monocytes Relative: 12 %
Neutro Abs: 5.3 10*3/uL (ref 1.7–7.7)
Neutrophils Relative %: 70 %
Platelets: 155 10*3/uL (ref 150–400)
RBC: 2.76 MIL/uL — ABNORMAL LOW (ref 4.22–5.81)
RDW: 24.1 % — ABNORMAL HIGH (ref 11.5–15.5)
WBC: 7.6 10*3/uL (ref 4.0–10.5)

## 2018-03-19 LAB — COMPREHENSIVE METABOLIC PANEL
ALT: 48 U/L — AB (ref 0–44)
AST: 44 U/L — ABNORMAL HIGH (ref 15–41)
Albumin: 4 g/dL (ref 3.5–5.0)
Alkaline Phosphatase: 45 U/L (ref 38–126)
Anion gap: 11 (ref 5–15)
BUN: 19 mg/dL (ref 6–20)
CHLORIDE: 105 mmol/L (ref 98–111)
CO2: 23 mmol/L (ref 22–32)
CREATININE: 0.91 mg/dL (ref 0.61–1.24)
Calcium: 9.1 mg/dL (ref 8.9–10.3)
GFR calc Af Amer: >60 mL/min (ref >60)
GFR calc non Af Amer: >60 mL/min (ref >60)
Glucose, Bld: 89 mg/dL (ref 70–99)
Potassium: 3.9 mmol/L (ref 3.5–5.1)
SODIUM: 139 mmol/L (ref 135–145)
Total Bilirubin: 4.3 mg/dL — ABNORMAL HIGH (ref 0.3–1.2)
Total Protein: 6.9 g/dL (ref 6.5–8.1)

## 2018-03-19 LAB — LACTATE DEHYDROGENASE: LDH: 538 U/L — AB (ref 98–192)

## 2018-03-19 LAB — HIV ANTIBODY (ROUTINE TESTING W REFLEX): HIV Screen 4th Generation wRfx: NONREACTIVE

## 2018-03-19 MED ORDER — PREDNISONE 20 MG PO TABS: 80.0000 mg | ORAL_TABLET | Freq: Every day | ORAL | 0 refills | Status: AC

## 2018-03-19 NOTE — Discharge Summary (Signed)
Physician Discharge Summary  Patient ID: TRAEGER Mcdaniel MRN: [id number] DOB/AGE: Oct 19, 1970 47 y.o.  Admit date: 03/18/2018 Discharge date: 03/19/2018  Admission Diagnoses:  Discharge Diagnoses:  Active Problems:   PNH (paroxysmal nocturnal hemoglobinuria) (HCC)   Hemolytic anemia (HCC)   Discharged Condition: stable  Hospital Course:  Travis Mcdaniel is a 47 year old male with past medical history significant for paroxysmal nocturnal hemoglobinuria (PNH) and hypertension.  Patient was admitted with another episode of hemolytic anemia.  Apparently, patient started feeling ill about 9 days prior to presentation.  Patient endorsed intermittent fever, chills, headache, loose stools 2 days ago, nausea and poor appetite.  Patient also reported feeling fatigued, dyspneic with exertion, back pain, and dysphagia which he attributes to his PNH.  On presentation, the patient's hemoglobin was 6.5 g/dL (down from 9.1 g/dL), bilirubin had also risen from 4.2 to 6.2.  Patient was admitted and started on steroids.  Patient bilirubin is down to 4.3.  Hemoglobin has remained stable at 9.3 g/dL.  Patient is back to his baseline, and is eager to be discharged back home.  Hematology/oncology team assisted with patient's management during the hospital stay.  Patient will be discharged home on oral prednisone.  Patient will follow-up with the hematologist on Monday, 03/21/2018.  Consults: hematology/oncology  Significant Diagnostic Studies: labs: Hemoglobin of 6.5g/dl.  On admission, the total bilirubin was 6.2..  Discharge Exam: Blood pressure 109/62, pulse 70, temperature 97.6 F (36.4 C), temperature source Oral, resp. rate 16, height 5\' 6"  (1.676 m), weight 75.7 kg, SpO2 100 %.   Disposition: Home.  Discharge Instructions    Diet - low sodium heart healthy   Complete by:  As directed    Increase activity slowly   Complete by:  As directed      Allergies as of 03/19/2018   No Known Allergies     Medication List    STOP taking these medications   lisinopril-hydrochlorothiazide 20-25 MG tablet Commonly known as:  PRINZIDE,ZESTORETIC   traMADol 50 MG tablet Commonly known as:  ULTRAM     TAKE these medications   acetaminophen 325 MG tablet Commonly known as:  TYLENOL Take 325-650 mg by mouth every 6 (six) hours as needed for mild pain.   ALPRAZolam 1 MG tablet Commonly known as:  XANAX Take 1 mg by mouth 3 (three) times daily.   aspirin EC 81 MG tablet Take 81 mg by mouth.   METAMUCIL PO Take 1 capsule by mouth daily.   multivitamin with minerals Tabs tablet Take 1 tablet by mouth daily.   predniSONE 20 MG tablet Commonly known as:  DELTASONE Take 4 tablets (80 mg total) by mouth daily with breakfast for 5 days. Start taking on:  03/20/2018   SOLIRIS IV Inject 900 mg into the vein every 14 (fourteen) days. [facility]   tetrahydrozoline 0.05 % ophthalmic solution Place 1 drop into both eyes 2 (two) times daily as needed (eye irritation).        SignedBonnell Public 03/19/2018, 11:01 AM

## 2018-03-19 NOTE — Progress Notes (Signed)
Discharge instructions and medications discussed with patient.  AVS given to patient.  All questions answered.  

## 2018-03-19 NOTE — Progress Notes (Signed)
HEMATOLOGY-ONCOLOGY PROGRESS NOTE  SUBJECTIVE: Patient with a history of paroxysmal nocturnal hemoglobinuria admitted with acute hemolytic anemia.  He was admitted with a hemoglobin of 6.5 and after transfusion and prednisone his hemoglobin is 9.3 today.  He is feeling significantly better and wants to go home today.  His hemoglobin normally runs between 11 to 13 g.  He felt extremely miserable coming into the hospital.  He feels significantly better today.  OBJECTIVE: REVIEW OF SYSTEMS:   Constitutional: Denies fevers, chills or abnormal weight loss Eyes: Denies blurriness of vision Ears, nose, mouth, throat, and face: Denies mucositis or sore throat Respiratory: Denies cough, dyspnea or wheezes Cardiovascular: Denies palpitation, chest discomfort Gastrointestinal:  Denies nausea, heartburn or change in bowel habits Skin: Denies abnormal skin rashes Lymphatics: Denies new lymphadenopathy or easy bruising Neurological:Denies numbness, tingling or new weaknesses Behavioral/Psych: Mood is stable, no new changes  Extremities: No lower extremity edema   All other systems were reviewed with the patient and are negative.  I have reviewed the past medical history, past surgical history, social history and family history with the patient and they are unchanged from previous note.   PHYSICAL EXAMINATION: ECOG PERFORMANCE STATUS: 1 - Symptomatic but completely ambulatory  Vitals:   03/18/18 2030 03/19/18 0443  BP: 106/66 109/62  Pulse: 84 70  Resp: 18 16  Temp: (!) 97.2 F (36.2 C) 97.6 F (36.4 C)  SpO2: 98% 100%   Filed Weights   03/18/18 0843 03/18/18 0903  Weight: 167 lb (75.8 kg) 166 lb 14.2 oz (75.7 kg)    GENERAL:alert, no distress and comfortable SKIN: skin color, texture, turgor are normal, no rashes or significant lesions EYES: normal, Conjunctiva are pink and non-injected, sclera clear OROPHARYNX:no exudate, no erythema and lips, buccal mucosa, and tongue normal  NECK:  supple, thyroid normal size, non-tender, without nodularity LYMPH:  no palpable lymphadenopathy in the cervical, axillary or inguinal LUNGS: clear to auscultation and percussion with normal breathing effort HEART: regular rate & rhythm and no murmurs and no lower extremity edema ABDOMEN:abdomen soft, non-tender and normal bowel sounds Musculoskeletal:no cyanosis of digits and no clubbing  NEURO: alert & oriented x 3 with fluent speech, no focal motor/sensory deficits  LABORATORY DATA:  I have reviewed the data as listed CMP Latest Ref Rng & Units 03/18/2018 03/14/2018 03/11/2018  Glucose 70 - 99 mg/dL 128(H) 114(H) 112(H)  BUN 6 - 20 mg/dL 25(H) 17 15  Creatinine 0.61 - 1.24 mg/dL 1.28(H) 0.91 0.89  Sodium 135 - 145 mmol/L 135 134(L) 135  Potassium 3.5 - 5.1 mmol/L 3.3(L) 4.0 3.7  Chloride 98 - 111 mmol/L 101 100 100  CO2 22 - 32 mmol/L 20(L) 23 24  Calcium 8.9 - 10.3 mg/dL 9.2 9.2 9.3  Total Protein 6.5 - 8.1 g/dL 7.1 7.7 7.3  Total Bilirubin 0.3 - 1.2 mg/dL 6.2(HH) 4.2(HH) 4.9(HH)  Alkaline Phos 38 - 126 U/L 60 61 65  AST 15 - 41 U/L 62(H) 118(H) 75(H)  ALT 0 - 44 U/L 59(H) 144(H) 94(H)    Lab Results  Component Value Date   WBC 7.6 03/19/2018   HGB 9.3 (L) 03/19/2018   HCT 29.1 (L) 03/19/2018   MCV 105.4 (H) 03/19/2018   PLT 155 03/19/2018   NEUTROABS 5.3 03/19/2018    ASSESSMENT AND PLAN: 1.  PNH with severe hemolytic anemia: Currently on prednisone 80 mg daily along with 2 units of blood transfusion given yesterday his hemoglobin is improved from 6.5-9.3.  He feels significantly better.  He wants to go home today. Okay to discharge from hematology standpoint. He will need to stay on prednisone 80 mg until Dr. Julien Nordmann evaluates him. I instructed him to come Monday morning at 8 AM to get blood counts checked to the cancer center. 2. he tells me that he is filing for disability because he cannot work with his condition.

## 2018-03-21 ENCOUNTER — Telehealth: Payer: Self-pay | Admitting: *Deleted

## 2018-03-21 ENCOUNTER — Telehealth: Payer: Self-pay

## 2018-03-21 ENCOUNTER — Inpatient Hospital Stay: Payer: BLUE CROSS/BLUE SHIELD

## 2018-03-21 ENCOUNTER — Other Ambulatory Visit: Payer: Self-pay | Admitting: Internal Medicine

## 2018-03-21 ENCOUNTER — Telehealth: Payer: Self-pay | Admitting: Internal Medicine

## 2018-03-21 DIAGNOSIS — D595 Paroxysmal nocturnal hemoglobinuria [Marchiafava-Micheli]: Secondary | ICD-10-CM

## 2018-03-21 LAB — CBC WITH DIFFERENTIAL (CANCER CENTER ONLY)
BASOS ABS: 0 10*3/uL (ref 0.0–0.1)
Basophils Relative: 0 %
EOS PCT: 0 %
Eosinophils Absolute: 0 10*3/uL (ref 0.0–0.5)
HEMATOCRIT: 29.4 % — AB (ref 38.4–49.9)
Hemoglobin: 9.2 g/dL — ABNORMAL LOW (ref 13.0–17.1)
LYMPHS ABS: 0.3 10*3/uL — AB (ref 0.9–3.3)
LYMPHS PCT: 4 %
MCH: 33.8 pg — ABNORMAL HIGH (ref 27.2–33.4)
MCHC: 31.3 g/dL — ABNORMAL LOW (ref 32.0–36.0)
MCV: 108.1 fL — ABNORMAL HIGH (ref 79.3–98.0)
Monocytes Absolute: 0.5 10*3/uL (ref 0.1–0.9)
Monocytes Relative: 9 %
Neutro Abs: 5.3 10*3/uL (ref 1.5–6.5)
Neutrophils Relative %: 87 %
Platelet Count: 119 10*3/uL — ABNORMAL LOW (ref 140–400)
RBC: 2.72 MIL/uL — ABNORMAL LOW (ref 4.20–5.82)
RDW: 23.4 % — ABNORMAL HIGH (ref 11.0–14.6)
WBC: 6.1 10*3/uL (ref 4.0–10.3)
nRBC: 2 /100 WBC — ABNORMAL HIGH

## 2018-03-21 LAB — CMP (CANCER CENTER ONLY)
ALBUMIN: 3.6 g/dL (ref 3.5–5.0)
ALT: 31 U/L (ref 0–44)
AST: 21 U/L (ref 15–41)
Alkaline Phosphatase: 64 U/L (ref 38–126)
Anion gap: 9 (ref 5–15)
BILIRUBIN TOTAL: 2.6 mg/dL — AB (ref 0.3–1.2)
BUN: 9 mg/dL (ref 6–20)
CO2: 23 mmol/L (ref 22–32)
CREATININE: 0.93 mg/dL (ref 0.61–1.24)
Calcium: 8.6 mg/dL — ABNORMAL LOW (ref 8.9–10.3)
Chloride: 103 mmol/L (ref 98–111)
GFR, Est AFR Am: >60 mL/min (ref >60)
GLUCOSE: 102 mg/dL — AB (ref 70–99)
POTASSIUM: 3.8 mmol/L (ref 3.5–5.1)
Sodium: 135 mmol/L (ref 135–145)
TOTAL PROTEIN: 6.2 g/dL — AB (ref 6.5–8.1)

## 2018-03-21 LAB — LACTATE DEHYDROGENASE: LDH: 454 U/L — ABNORMAL HIGH (ref 98–192)

## 2018-03-21 NOTE — Telephone Encounter (Signed)
Returned call to pt LMOVM, reviewed with MD who does not recommend stopping BP as pt has a history of HBP. MD advised pt call PCP and discuss if he should stop or continue BP medication. MD recommends not taking tramadol if pt not having pain.

## 2018-03-21 NOTE — Telephone Encounter (Signed)
R/s appts and cancelled appts per 9/23 sch message - spoke with patient and he is aware of changed and will pick up and updated schedule next visit.

## 2018-03-21 NOTE — Telephone Encounter (Signed)
Er 9/23 patient walk in to add labs approved by MM.

## 2018-03-22 LAB — TYPE AND SCREEN
ABO/RH(D): B POS
Antibody Screen: NEGATIVE
UNIT DIVISION: 0
UNIT DIVISION: 0
Unit division: 0

## 2018-03-22 LAB — BPAM RBC
Blood Product Expiration Date: 201910192359
Blood Product Expiration Date: 201910192359
Blood Product Expiration Date: 201910192359
ISSUE DATE / TIME: 201909201108
ISSUE DATE / TIME: 201909201358
UNIT TYPE AND RH: 7300
Unit Type and Rh: 7300
Unit Type and Rh: 7300

## 2018-03-22 LAB — HAPTOGLOBIN: Haptoglobin: <10 mg/dL — ABNORMAL LOW (ref 34–200)

## 2018-03-25 ENCOUNTER — Inpatient Hospital Stay: Payer: BLUE CROSS/BLUE SHIELD

## 2018-03-25 ENCOUNTER — Inpatient Hospital Stay: Payer: BLUE CROSS/BLUE SHIELD | Admitting: Oncology

## 2018-03-25 DIAGNOSIS — D595 Paroxysmal nocturnal hemoglobinuria [Marchiafava-Micheli]: Secondary | ICD-10-CM

## 2018-03-25 LAB — COMPREHENSIVE METABOLIC PANEL
ALBUMIN: 3.5 g/dL (ref 3.5–5.0)
ALT: 29 U/L (ref 0–44)
AST: 26 U/L (ref 15–41)
Alkaline Phosphatase: 56 U/L (ref 38–126)
Anion gap: 11 (ref 5–15)
BILIRUBIN TOTAL: 3.3 mg/dL — AB (ref 0.3–1.2)
BUN: 17 mg/dL (ref 6–20)
CHLORIDE: 101 mmol/L (ref 98–111)
CO2: 25 mmol/L (ref 22–32)
CREATININE: 0.84 mg/dL (ref 0.61–1.24)
Calcium: 8.9 mg/dL (ref 8.9–10.3)
GFR calc Af Amer: >60 mL/min (ref >60)
GLUCOSE: 106 mg/dL — AB (ref 70–99)
POTASSIUM: 3.7 mmol/L (ref 3.5–5.1)
Sodium: 137 mmol/L (ref 135–145)
Total Protein: 6.3 g/dL — ABNORMAL LOW (ref 6.5–8.1)

## 2018-03-25 LAB — CBC WITH DIFFERENTIAL/PLATELET
BASOS ABS: 0.1 10*3/uL (ref 0.0–0.1)
BASOS PCT: 1 %
Eosinophils Absolute: 0 10*3/uL (ref 0.0–0.5)
Eosinophils Relative: 0 %
HEMATOCRIT: 29.5 % — AB (ref 38.4–49.9)
Hemoglobin: 9.5 g/dL — ABNORMAL LOW (ref 13.0–17.1)
LYMPHS PCT: 18 %
Lymphs Abs: 1 10*3/uL (ref 0.9–3.3)
MCH: 33.8 pg — ABNORMAL HIGH (ref 27.2–33.4)
MCHC: 32.2 g/dL (ref 32.0–36.0)
MCV: 105.1 fL — AB (ref 79.3–98.0)
MONO ABS: 0.7 10*3/uL (ref 0.1–0.9)
Monocytes Relative: 13 %
NEUTROS ABS: 3.5 10*3/uL (ref 1.5–6.5)
Neutrophils Relative %: 68 %
PLATELETS: 121 10*3/uL — AB (ref 140–400)
RBC: 2.81 MIL/uL — AB (ref 4.20–5.82)
RDW: 23.9 % — AB (ref 11.0–14.6)
WBC: 5.3 10*3/uL (ref 4.0–10.3)

## 2018-03-25 LAB — LACTATE DEHYDROGENASE: LDH: 584 U/L — ABNORMAL HIGH (ref 98–192)

## 2018-04-08 ENCOUNTER — Encounter: Payer: Self-pay | Admitting: Internal Medicine

## 2018-04-08 ENCOUNTER — Inpatient Hospital Stay: Payer: BLUE CROSS/BLUE SHIELD | Admitting: Internal Medicine

## 2018-04-08 ENCOUNTER — Inpatient Hospital Stay: Payer: BLUE CROSS/BLUE SHIELD | Attending: Internal Medicine

## 2018-04-08 ENCOUNTER — Telehealth: Payer: Self-pay | Admitting: Internal Medicine

## 2018-04-08 VITALS — BP 118/67 | HR 92 | Temp 98.1°F | Resp 18 | Ht 66.0 in | Wt 164.9 lb

## 2018-04-08 DIAGNOSIS — I10 Essential (primary) hypertension: Secondary | ICD-10-CM

## 2018-04-08 DIAGNOSIS — D595 Paroxysmal nocturnal hemoglobinuria [Marchiafava-Micheli]: Secondary | ICD-10-CM | POA: Diagnosis present

## 2018-04-08 DIAGNOSIS — Z5111 Encounter for antineoplastic chemotherapy: Secondary | ICD-10-CM

## 2018-04-08 DIAGNOSIS — D598 Other acquired hemolytic anemias: Secondary | ICD-10-CM

## 2018-04-08 LAB — COMPREHENSIVE METABOLIC PANEL
ALBUMIN: 4 g/dL (ref 3.5–5.0)
ALK PHOS: 72 U/L (ref 38–126)
ALT: 26 U/L (ref 0–44)
AST: 28 U/L (ref 15–41)
Anion gap: 13 (ref 5–15)
BILIRUBIN TOTAL: 2.5 mg/dL — AB (ref 0.3–1.2)
BUN: 10 mg/dL (ref 6–20)
CALCIUM: 9.8 mg/dL (ref 8.9–10.3)
CO2: 23 mmol/L (ref 22–32)
Chloride: 102 mmol/L (ref 98–111)
Creatinine, Ser: 0.97 mg/dL (ref 0.61–1.24)
GLUCOSE: 118 mg/dL — AB (ref 70–99)
Potassium: 3.9 mmol/L (ref 3.5–5.1)
Sodium: 138 mmol/L (ref 135–145)
TOTAL PROTEIN: 7.9 g/dL (ref 6.5–8.1)

## 2018-04-08 LAB — CBC WITH DIFFERENTIAL/PLATELET
Abs Immature Granulocytes: 0.03 10*3/uL (ref 0.00–0.07)
BASOS PCT: 1 %
Basophils Absolute: 0 10*3/uL (ref 0.0–0.1)
EOS ABS: 0 10*3/uL (ref 0.0–0.5)
EOS PCT: 1 %
HCT: 28.6 % — ABNORMAL LOW (ref 39.0–52.0)
Hemoglobin: 8.5 g/dL — ABNORMAL LOW (ref 13.0–17.0)
Immature Granulocytes: 1 %
Lymphocytes Relative: 30 %
Lymphs Abs: 1 10*3/uL (ref 0.7–4.0)
MCH: 33.9 pg (ref 26.0–34.0)
MCHC: 29.7 g/dL — AB (ref 30.0–36.0)
MCV: 113.9 fL — AB (ref 80.0–100.0)
MONOS PCT: 8 %
Monocytes Absolute: 0.3 10*3/uL (ref 0.1–1.0)
NEUTROS PCT: 59 %
Neutro Abs: 1.9 10*3/uL (ref 1.7–7.7)
PLATELETS: 181 10*3/uL (ref 150–400)
RBC: 2.51 MIL/uL — ABNORMAL LOW (ref 4.22–5.81)
RDW: 25.3 % — AB (ref 11.5–15.5)
WBC: 3.2 10*3/uL — ABNORMAL LOW (ref 4.0–10.5)
nRBC: 1.6 % — ABNORMAL HIGH (ref 0.0–0.2)

## 2018-04-08 LAB — LACTATE DEHYDROGENASE: LDH: 377 U/L — ABNORMAL HIGH (ref 98–192)

## 2018-04-08 MED ORDER — PREDNISONE 20 MG PO TABS: ORAL_TABLET | ORAL | 0 refills | Status: DC

## 2018-04-08 NOTE — Telephone Encounter (Signed)
Scheduled appt per 10/11 los - pt is aware of appt date and time.

## 2018-04-08 NOTE — Progress Notes (Signed)
[place] [phone]   Fax:(336) [phone]  OFFICE PROGRESS NOTE  [name], MD [address] 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  PRIOR THERAPY: The patient was previously treated with a tapering dose of prednisone and high-dose IVIG for the hemolytic anemia before his diagnosis of paroxysmal nocturnal hemoglobinuria.  CURRENT THERAPY: Soliris (Eculizumab) initially with induction 600 MG IV weekly for 4 weeks and currently on maintenance treatment 900 MG IV every 2 weeks. First dose of treatment was given on 11/21/2014. Status post 44 cycles.  INTERVAL HISTORY: Travis Mcdaniel 47 y.o. male returns to the clinic today for follow-up visit.  The patient continues to complain of increasing fatigue and weakness.  He had a hemolytic episodes of PNH few weeks ago and started on high-dose prednisone.  Unfortunately it was discontinued 2 weeks ago with no taper after he was discharged from the hospital.  He denied having any current chest pain but has shortness of breath with exertion with no cough or hemoptysis.  He denied having any recent weight loss or night sweats.  He has no nausea, vomiting, diarrhea or constipation.  He is here today for evaluation and repeat blood work before his treatment.  MEDICAL HISTORY: Past Medical History:  Diagnosis Date  . Cancer (HCC)    PNH (Bone marrow)  . Diverticulosis   . Encounter for antineoplastic chemotherapy 01/15/2016  . Hypertension     ALLERGIES:  has No Known Allergies.  MEDICATIONS:  Current Outpatient Medications  Medication Sig Dispense Refill  . ALPRAZolam (XANAX) 1 MG tablet Take 1 mg by mouth 3 (three) times daily.  2  . aspirin EC 81 MG tablet Take 81 mg by mouth.    . Eculizumab (SOLIRIS IV) Inject 900 mg into the vein every 14 (fourteen) days. [facility]    .  lisinopril-hydrochlorothiazide (PRINZIDE,ZESTORETIC) 20-25 MG tablet Take 1 tablet by mouth daily.    . Multiple Vitamin (MULTIVITAMIN WITH MINERALS) TABS tablet Take 1 tablet by mouth daily.    . Psyllium (METAMUCIL PO) Take 1 capsule by mouth daily.    [name] Kitchen tetrahydrozoline 0.05 % ophthalmic solution Place 1 drop into both eyes 2 (two) times daily as needed (eye irritation).    . traMADol (ULTRAM) 50 MG tablet Take by mouth every 6 (six) hours as needed.     No current facility-administered medications for this visit.     SURGICAL HISTORY: History reviewed. No pertinent surgical history.  REVIEW OF SYSTEMS:  Constitutional: positive for fatigue Eyes: negative Ears, nose, mouth, throat, and face: negative Respiratory: positive for dyspnea on exertion Cardiovascular: negative Gastrointestinal: negative Genitourinary:negative Integument/breast: negative Hematologic/lymphatic: negative Musculoskeletal:negative Neurological: negative Behavioral/Psych: negative Endocrine: negative Allergic/Immunologic: negative   PHYSICAL EXAMINATION: General appearance: alert, cooperative, fatigued and no distress Head: Normocephalic, without obvious abnormality, atraumatic Neck: no adenopathy, no JVD, supple, symmetrical, trachea midline and thyroid not enlarged, symmetric, no tenderness/mass/nodules Lymph nodes: Cervical, supraclavicular, and axillary nodes normal. Resp: clear to auscultation bilaterally Back: symmetric, no curvature. ROM normal. No CVA tenderness. Cardio: regular rate and rhythm, S1, S2 normal, no murmur, click, rub or gallop GI: soft, non-tender; bowel sounds normal; no masses,  no organomegaly Extremities: extremities normal, atraumatic, no cyanosis or edema Neurologic: Alert and oriented X 3, normal strength and tone. Normal symmetric reflexes. Normal coordination and gait  ECOG PERFORMANCE STATUS: 0 - Asymptomatic  Blood pressure 118/67, pulse  92, temperature 98.1 F (36.7  C), temperature source Oral, resp. rate 18, height 5\' 6"  (1.676 m), weight 164 lb 14.4 oz (74.8 kg), SpO2 100 %.  LABORATORY DATA: Lab Results  Component Value Date   WBC 3.2 (L) 04/08/2018   HGB 8.5 (L) 04/08/2018   HCT 28.6 (L) 04/08/2018   MCV 113.9 (H) 04/08/2018   PLT 181 04/08/2018      Chemistry      Component Value Date/Time   NA 138 04/08/2018 0746   NA 136 06/28/2017 1236   K 3.9 04/08/2018 0746   K 4.1 06/28/2017 1236   CL 102 04/08/2018 0746   CO2 23 04/08/2018 0746   CO2 24 06/28/2017 1236   BUN 10 04/08/2018 0746   BUN 17.1 06/28/2017 1236   CREATININE 0.97 04/08/2018 0746   CREATININE 0.93 03/21/2018 0814   CREATININE 1.0 06/28/2017 1236      Component Value Date/Time   CALCIUM 9.8 04/08/2018 0746   CALCIUM 9.1 06/28/2017 1236   ALKPHOS 72 04/08/2018 0746   ALKPHOS 66 06/28/2017 1236   AST 28 04/08/2018 0746   AST 21 03/21/2018 0814   AST 40 (H) 06/28/2017 1236   ALT 26 04/08/2018 0746   ALT 31 03/21/2018 0814   ALT 60 (H) 06/28/2017 1236   BILITOT 2.5 (H) 04/08/2018 0746   BILITOT 2.6 (H) 03/21/2018 0814   BILITOT 2.87 (H) 06/28/2017 1236       RADIOGRAPHIC STUDIES: No results found.  ASSESSMENT AND PLAN:  This is a very pleasant 47 years old white male with paroxysmal nocturnal hemoglobinuria and currently on treatment with Soliris on days 1 and 15 every 4 weeks status post 44 cycles. The patient has been tolerating his treatment well except for the episode of hemolytic anemia have been 4 weeks ago.  He is recovering well.  He is feeling much better but continues to have mild anemia.  I recommended for him to proceed with cycle #45 today. I also recommended for the patient to start treatment with a taper dose of prednisone again starting with 60 mg p.o. daily.  I will send prescription to his pharmacy today.  We also discussed the treatment options with Ultomiris but the patient is concerned about the risk of infection and he would like to  continue his current treatment with Solaris. I will see the patient every 2 weeks during his treatment for evaluation and repeat blood work.  He was advised to call immediately if he has any concerning symptoms in the interval. The patient voices understanding of current disease status and treatment options and is in agreement with the current care plan. All questions were answered. The patient knows to call the clinic with any problems, questions or concerns. We can certainly see the patient much sooner if necessary. I spent 15 minutes counseling the patient face to face. The total time spent in the appointment was 25 minutes.  Disclaimer: This note was dictated with voice recognition software. Similar sounding words can inadvertently be transcribed and may not be corrected upon review.

## 2018-04-14 ENCOUNTER — Encounter: Payer: Self-pay | Admitting: Internal Medicine

## 2018-04-14 NOTE — Progress Notes (Signed)
Received call from patient referred by Social Worker inquiring about Medicare and Medicaid. Patient states he has applied for disability through the [facility]. Advised patient if he is approved for disability, he may qualify for Medicaid under disability. As far as Medicare, he would also need to be deemed disabled and would need to apply through Medicare. Patient wanted to know what to do as far as if denied for disability. Advised patient his rights will be included in the letter and he may follow the steps to appeal. He verbalized understanding. Patient states he will contact me once decision has been made regarding his disability. He was asking about his BC[employer] and whether to keep or not. Advised him he may want to wait to see what happens with disability but would be up to him. He verbalized understanding.

## 2018-04-20 ENCOUNTER — Inpatient Hospital Stay (HOSPITAL_BASED_OUTPATIENT_CLINIC_OR_DEPARTMENT_OTHER): Payer: BLUE CROSS/BLUE SHIELD | Admitting: Internal Medicine

## 2018-04-20 ENCOUNTER — Inpatient Hospital Stay: Payer: BLUE CROSS/BLUE SHIELD

## 2018-04-20 ENCOUNTER — Encounter: Payer: Self-pay | Admitting: Internal Medicine

## 2018-04-20 VITALS — BP 122/74 | HR 74 | Temp 98.0°F | Resp 18 | Ht 66.0 in | Wt 168.2 lb

## 2018-04-20 DIAGNOSIS — D595 Paroxysmal nocturnal hemoglobinuria [Marchiafava-Micheli]: Secondary | ICD-10-CM

## 2018-04-20 DIAGNOSIS — I10 Essential (primary) hypertension: Secondary | ICD-10-CM

## 2018-04-20 LAB — CBC WITH DIFFERENTIAL/PLATELET
Abs Immature Granulocytes: 0.02 10*3/uL (ref 0.00–0.07)
Basophils Absolute: 0 10*3/uL (ref 0.0–0.1)
Basophils Relative: 0 %
EOS ABS: 0 10*3/uL (ref 0.0–0.5)
EOS PCT: 0 %
HCT: 44.5 % (ref 39.0–52.0)
HEMOGLOBIN: 14.5 g/dL (ref 13.0–17.0)
Immature Granulocytes: 0 %
LYMPHS PCT: 22 %
Lymphs Abs: 1.3 10*3/uL (ref 0.7–4.0)
MCH: 35.3 pg — AB (ref 26.0–34.0)
MCHC: 32.6 g/dL (ref 30.0–36.0)
MCV: 108.3 fL — ABNORMAL HIGH (ref 80.0–100.0)
MONO ABS: 0.6 10*3/uL (ref 0.1–1.0)
Monocytes Relative: 10 %
NRBC: 0 % (ref 0.0–0.2)
Neutro Abs: 3.9 10*3/uL (ref 1.7–7.7)
Neutrophils Relative %: 68 %
Platelets: 138 10*3/uL — ABNORMAL LOW (ref 150–400)
RBC: 4.11 MIL/uL — AB (ref 4.22–5.81)
RDW: 15.9 % — AB (ref 11.5–15.5)
WBC: 5.8 10*3/uL (ref 4.0–10.5)

## 2018-04-20 LAB — COMPREHENSIVE METABOLIC PANEL
ALK PHOS: 51 U/L (ref 38–126)
ALT: 28 U/L (ref 0–44)
AST: 18 U/L (ref 15–41)
Albumin: 4 g/dL (ref 3.5–5.0)
Anion gap: 12 (ref 5–15)
BILIRUBIN TOTAL: 1.9 mg/dL — AB (ref 0.3–1.2)
BUN: 19 mg/dL (ref 6–20)
CALCIUM: 9.2 mg/dL (ref 8.9–10.3)
CHLORIDE: 100 mmol/L (ref 98–111)
CO2: 24 mmol/L (ref 22–32)
Creatinine, Ser: 0.87 mg/dL (ref 0.61–1.24)
Glucose, Bld: 97 mg/dL (ref 70–99)
Potassium: 3.9 mmol/L (ref 3.5–5.1)
Sodium: 136 mmol/L (ref 135–145)
Total Protein: 6.9 g/dL (ref 6.5–8.1)

## 2018-04-20 LAB — LACTATE DEHYDROGENASE: LDH: 155 U/L (ref 98–192)

## 2018-04-20 NOTE — Progress Notes (Signed)
[place] [phone]   Fax:(336) [phone]  OFFICE PROGRESS NOTE  [name], MD [address] 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  PRIOR THERAPY: The patient was previously treated with a tapering dose of prednisone and high-dose IVIG for the hemolytic anemia before his diagnosis of paroxysmal nocturnal hemoglobinuria.  CURRENT THERAPY: Soliris (Eculizumab) initially with induction 600 MG IV weekly for 4 weeks and currently on maintenance treatment 900 MG IV every 2 weeks. First dose of treatment was given on 11/21/2014. Status post 45 cycles.  INTERVAL HISTORY: Travis Mcdaniel 47 y.o. male returns to the clinic today for follow-up visit.  The patient is feeling much better today.  He is currently on a taper dose of prednisone 40 mg p.o. daily starting this week.  He denied having any chest pain, shortness of breath, cough or hemoptysis.  He denied having any fever or chills.  He has no nausea, vomiting, diarrhea or constipation.  He has no headache or visual changes.  He is here today for evaluation and repeat blood work before starting cycle #46 of his treatment.   MEDICAL HISTORY: Past Medical History:  Diagnosis Date  . Cancer (HCC)    PNH (Bone marrow)  . Diverticulosis   . Encounter for antineoplastic chemotherapy 01/15/2016  . Hypertension     ALLERGIES:  has No Known Allergies.  MEDICATIONS:  Current Outpatient Medications  Medication Sig Dispense Refill  . ALPRAZolam (XANAX) 1 MG tablet Take 1 mg by mouth 3 (three) times daily.  2  . aspirin EC 81 MG tablet Take 81 mg by mouth.    . Eculizumab (SOLIRIS IV) Inject 900 mg into the vein every 14 (fourteen) days. [facility]    . lisinopril-hydrochlorothiazide (PRINZIDE,ZESTORETIC) 20-25 MG tablet Take 1 tablet by mouth daily.    . Multiple Vitamin  (MULTIVITAMIN WITH MINERALS) TABS tablet Take 1 tablet by mouth daily.    . predniSONE (DELTASONE) 20 MG tablet 3 tablets p.o. daily for 1 week, then 2 tablets p.o. daily for 1 week, then 1 tablet p.o. daily for 1 week, then 0.5 tablet p.o. daily for 1 week. 45 tablet 0  . Psyllium (METAMUCIL PO) Take 1 capsule by mouth daily.    [name] Kitchen tetrahydrozoline 0.05 % ophthalmic solution Place 1 drop into both eyes 2 (two) times daily as needed (eye irritation).    . traMADol (ULTRAM) 50 MG tablet Take by mouth every 6 (six) hours as needed.     No current facility-administered medications for this visit.     SURGICAL HISTORY: No past surgical history on file.  REVIEW OF SYSTEMS:  A comprehensive review of systems was negative except for: Constitutional: positive for fatigue   PHYSICAL EXAMINATION: General appearance: alert, cooperative, fatigued and no distress Head: Normocephalic, without obvious abnormality, atraumatic Neck: no adenopathy, no JVD, supple, symmetrical, trachea midline and thyroid not enlarged, symmetric, no tenderness/mass/nodules Lymph nodes: Cervical, supraclavicular, and axillary nodes normal. Resp: clear to auscultation bilaterally Back: symmetric, no curvature. ROM normal. No CVA tenderness. Cardio: regular rate and rhythm, S1, S2 normal, no murmur, click, rub or gallop GI: soft, non-tender; bowel sounds normal; no masses,  no organomegaly Extremities: extremities normal, atraumatic, no cyanosis or edema  ECOG PERFORMANCE STATUS: 0 - Asymptomatic  Blood pressure 122/74, pulse 74, temperature 98 F (36.7 C), temperature source Oral, resp. rate 18, height 5\' 6"  (1.676 m),  weight 168 lb 3.2 oz (76.3 kg), SpO2 99 %.  LABORATORY DATA: Lab Results  Component Value Date   WBC 5.8 04/20/2018   HGB 14.5 04/20/2018   HCT 44.5 04/20/2018   MCV 108.3 (H) 04/20/2018   PLT 138 (L) 04/20/2018      Chemistry      Component Value Date/Time   NA 138 04/08/2018 0746   NA 136  06/28/2017 1236   K 3.9 04/08/2018 0746   K 4.1 06/28/2017 1236   CL 102 04/08/2018 0746   CO2 23 04/08/2018 0746   CO2 24 06/28/2017 1236   BUN 10 04/08/2018 0746   BUN 17.1 06/28/2017 1236   CREATININE 0.97 04/08/2018 0746   CREATININE 0.93 03/21/2018 0814   CREATININE 1.0 06/28/2017 1236      Component Value Date/Time   CALCIUM 9.8 04/08/2018 0746   CALCIUM 9.1 06/28/2017 1236   ALKPHOS 72 04/08/2018 0746   ALKPHOS 66 06/28/2017 1236   AST 28 04/08/2018 0746   AST 21 03/21/2018 0814   AST 40 (H) 06/28/2017 1236   ALT 26 04/08/2018 0746   ALT 31 03/21/2018 0814   ALT 60 (H) 06/28/2017 1236   BILITOT 2.5 (H) 04/08/2018 0746   BILITOT 2.6 (H) 03/21/2018 0814   BILITOT 2.87 (H) 06/28/2017 1236       RADIOGRAPHIC STUDIES: No results found.  ASSESSMENT AND PLAN:  This is a very pleasant 47 years old white male with paroxysmal nocturnal hemoglobinuria and currently on treatment with Soliris on days 1 and 15 every 4 weeks status post 44 cycles. The patient has been tolerating his treatment well except for the episode of hemolytic anemia have been 4 weeks ago.  He is recovering well.  He is feeling much better but continues to have mild anemia.  I recommended for him to proceed with cycle #45 today. The patient continues to tolerate the treatment well. His CBC today showed significant improvement in his hemoglobin and hematocrit. Complaints metabolic panel is a still pending. I recommended for the patient to proceed with day 15 of cycle #45 today as scheduled. I will see him back for follow-up visit in 2 weeks for evaluation before the next dose of his treatment. He will continue with a taper dose of prednisone. The patient voices understanding of current disease status and treatment options and is in agreement with the current care plan. All questions were answered. The patient knows to call the clinic with any problems, questions or concerns. We can certainly see the patient  much sooner if necessary. I spent 10 minutes counseling the patient face to face. The total time spent in the appointment was 15 minutes.  Disclaimer: This note was dictated with voice recognition software. Similar sounding words can inadvertently be transcribed and may not be corrected upon review.

## 2018-04-21 ENCOUNTER — Other Ambulatory Visit: Payer: PRIVATE HEALTH INSURANCE

## 2018-04-21 ENCOUNTER — Telehealth: Payer: Self-pay | Admitting: Internal Medicine

## 2018-04-21 NOTE — Telephone Encounter (Signed)
Appts already scheduled per 10/23 los - no additional appts added.

## 2018-04-22 ENCOUNTER — Other Ambulatory Visit: Payer: PRIVATE HEALTH INSURANCE

## 2018-05-03 ENCOUNTER — Other Ambulatory Visit: Payer: Self-pay | Admitting: Internal Medicine

## 2018-05-04 ENCOUNTER — Encounter: Payer: Self-pay | Admitting: Internal Medicine

## 2018-05-04 ENCOUNTER — Inpatient Hospital Stay: Payer: BLUE CROSS/BLUE SHIELD

## 2018-05-04 ENCOUNTER — Inpatient Hospital Stay: Payer: BLUE CROSS/BLUE SHIELD | Attending: Internal Medicine | Admitting: Internal Medicine

## 2018-05-04 ENCOUNTER — Other Ambulatory Visit: Payer: Self-pay | Admitting: Internal Medicine

## 2018-05-04 ENCOUNTER — Telehealth: Payer: Self-pay | Admitting: Internal Medicine

## 2018-05-04 VITALS — BP 136/77 | HR 90 | Temp 98.4°F | Resp 18 | Ht 66.0 in | Wt 167.0 lb

## 2018-05-04 DIAGNOSIS — Z7982 Long term (current) use of aspirin: Secondary | ICD-10-CM

## 2018-05-04 DIAGNOSIS — Z79899 Other long term (current) drug therapy: Secondary | ICD-10-CM

## 2018-05-04 DIAGNOSIS — I10 Essential (primary) hypertension: Secondary | ICD-10-CM | POA: Diagnosis not present

## 2018-05-04 DIAGNOSIS — D595 Paroxysmal nocturnal hemoglobinuria [Marchiafava-Micheli]: Secondary | ICD-10-CM

## 2018-05-04 LAB — CBC WITH DIFFERENTIAL (CANCER CENTER ONLY)
Abs Immature Granulocytes: 0.02 10*3/uL (ref 0.00–0.07)
BASOS ABS: 0 10*3/uL (ref 0.0–0.1)
Basophils Relative: 1 %
EOS ABS: 0.1 10*3/uL (ref 0.0–0.5)
Eosinophils Relative: 1 %
HEMATOCRIT: 40.7 % (ref 39.0–52.0)
Hemoglobin: 14 g/dL (ref 13.0–17.0)
IMMATURE GRANULOCYTES: 0 %
LYMPHS ABS: 1.3 10*3/uL (ref 0.7–4.0)
Lymphocytes Relative: 18 %
MCH: 35.5 pg — ABNORMAL HIGH (ref 26.0–34.0)
MCHC: 34.4 g/dL (ref 30.0–36.0)
MCV: 103.3 fL — ABNORMAL HIGH (ref 80.0–100.0)
Monocytes Absolute: 0.5 10*3/uL (ref 0.1–1.0)
Monocytes Relative: 7 %
NEUTROS PCT: 73 %
NRBC: 0 % (ref 0.0–0.2)
Neutro Abs: 5 10*3/uL (ref 1.7–7.7)
PLATELETS: 116 10*3/uL — AB (ref 150–400)
RBC: 3.94 MIL/uL — ABNORMAL LOW (ref 4.22–5.81)
RDW: 14.4 % (ref 11.5–15.5)
WBC Count: 6.9 10*3/uL (ref 4.0–10.5)

## 2018-05-04 LAB — COMPREHENSIVE METABOLIC PANEL
ALK PHOS: 53 U/L (ref 38–126)
ALT: 82 U/L — ABNORMAL HIGH (ref 0–44)
AST: 54 U/L — ABNORMAL HIGH (ref 15–41)
Albumin: 4.3 g/dL (ref 3.5–5.0)
Anion gap: 11 (ref 5–15)
BILIRUBIN TOTAL: 3.8 mg/dL — AB (ref 0.3–1.2)
BUN: 18 mg/dL (ref 6–20)
CALCIUM: 9.5 mg/dL (ref 8.9–10.3)
CO2: 25 mmol/L (ref 22–32)
CREATININE: 0.99 mg/dL (ref 0.61–1.24)
Chloride: 102 mmol/L (ref 98–111)
GFR calc non Af Amer: >60 mL/min (ref >60)
Glucose, Bld: 104 mg/dL — ABNORMAL HIGH (ref 70–99)
Potassium: 3.6 mmol/L (ref 3.5–5.1)
SODIUM: 138 mmol/L (ref 135–145)
TOTAL PROTEIN: 7.4 g/dL (ref 6.5–8.1)

## 2018-05-04 LAB — LACTATE DEHYDROGENASE: LDH: 267 U/L — ABNORMAL HIGH (ref 98–192)

## 2018-05-04 MED ORDER — PREDNISONE 20 MG PO TABS: ORAL_TABLET | ORAL | 0 refills | Status: DC

## 2018-05-04 NOTE — Telephone Encounter (Signed)
Appts scheduled avs declined/calendar printed per 11/6 los °

## 2018-05-04 NOTE — Progress Notes (Signed)
[place] Telephone:(336) [phone]   Fax:([phone]  OFFICE PROGRESS NOTE  [name], MD [address] 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  PRIOR THERAPY: The patient was previously treated with a tapering dose of prednisone and high-dose IVIG for the hemolytic anemia before his diagnosis of paroxysmal nocturnal hemoglobinuria.  CURRENT THERAPY: Soliris (Eculizumab) initially with induction 600 MG IV weekly for 4 weeks and currently on maintenance treatment 900 MG IV every 2 weeks. First dose of treatment was given on 11/21/2014. Status post 45 cycles.  INTERVAL HISTORY: Travis Mcdaniel 47 y.o. male returns to the clinic today for follow-up visit.  The patient is feeling much better these days.  He expected to take the last dose of the taper prednisone tomorrow.  He denied having any chest pain, shortness of breath, cough or hemoptysis.  He denied having any fever or chills.  He has occasional bruises.  The patient denied having any recent weight loss or night sweats.  He is here today for evaluation and repeat blood work before his treatment with Bernice.   MEDICAL HISTORY: Past Medical History:  Diagnosis Date  . Cancer (HCC)    PNH (Bone marrow)  . Diverticulosis   . Encounter for antineoplastic chemotherapy 01/15/2016  . Hypertension     ALLERGIES:  has No Known Allergies.  MEDICATIONS:  Current Outpatient Medications  Medication Sig Dispense Refill  . ALPRAZolam (XANAX) 1 MG tablet Take 1 mg by mouth 3 (three) times daily.  2  . aspirin EC 81 MG tablet Take 81 mg by mouth.    . Eculizumab (SOLIRIS IV) Inject 900 mg into the vein every 14 (fourteen) days. [facility]    . lisinopril-hydrochlorothiazide (PRINZIDE,ZESTORETIC) 20-25 MG tablet Take 1 tablet by mouth daily.    . Multiple Vitamin (MULTIVITAMIN WITH MINERALS)  TABS tablet Take 1 tablet by mouth daily.    . predniSONE (DELTASONE) 10 MG tablet TAKE 6 TABS DAILY FOR 1 WK, 4 TABS DAILY FOR 1 WK, 2 TABS DAILY FOR 1 WK, 1 TAB DAILY FOR 1 WK 90 tablet 0  . predniSONE (DELTASONE) 20 MG tablet 3 tablets p.o. daily for 1 week, then 2 tablets p.o. daily for 1 week, then 1 tablet p.o. daily for 1 week, then 0.5 tablet p.o. daily for 1 week. 45 tablet 0  . Psyllium (METAMUCIL PO) Take 1 capsule by mouth daily.    [name] Kitchen tetrahydrozoline 0.05 % ophthalmic solution Place 1 drop into both eyes 2 (two) times daily as needed (eye irritation).    . traMADol (ULTRAM) 50 MG tablet Take by mouth every 6 (six) hours as needed.     No current facility-administered medications for this visit.     SURGICAL HISTORY: No past surgical history on file.  REVIEW OF SYSTEMS:  A comprehensive review of systems was negative.   PHYSICAL EXAMINATION: General appearance: alert, cooperative, fatigued and no distress Head: Normocephalic, without obvious abnormality, atraumatic Neck: no adenopathy, no JVD, supple, symmetrical, trachea midline and thyroid not enlarged, symmetric, no tenderness/mass/nodules Lymph nodes: Cervical, supraclavicular, and axillary nodes normal. Resp: clear to auscultation bilaterally Back: symmetric, no curvature. ROM normal. No CVA tenderness. Cardio: regular rate and rhythm, S1, S2 normal, no murmur, click, rub or gallop GI: soft, non-tender; bowel sounds normal; no masses,  no organomegaly Extremities: extremities normal, atraumatic, no cyanosis or edema  ECOG PERFORMANCE STATUS: 0 -  Asymptomatic  Blood pressure 136/77, pulse 90, temperature 98.4 F (36.9 C), temperature source Oral, resp. rate 18, height 5\' 6"  (1.676 m), weight 167 lb (75.8 kg), SpO2 100 %.  LABORATORY DATA: Lab Results  Component Value Date   WBC 6.9 05/04/2018   HGB 14.0 05/04/2018   HCT 40.7 05/04/2018   MCV 103.3 (H) 05/04/2018   PLT 116 (L) 05/04/2018      Chemistry        Component Value Date/Time   NA 136 04/20/2018 0740   NA 136 06/28/2017 1236   K 3.9 04/20/2018 0740   K 4.1 06/28/2017 1236   CL 100 04/20/2018 0740   CO2 24 04/20/2018 0740   CO2 24 06/28/2017 1236   BUN 19 04/20/2018 0740   BUN 17.1 06/28/2017 1236   CREATININE 0.87 04/20/2018 0740   CREATININE 0.93 03/21/2018 0814   CREATININE 1.0 06/28/2017 1236      Component Value Date/Time   CALCIUM 9.2 04/20/2018 0740   CALCIUM 9.1 06/28/2017 1236   ALKPHOS 51 04/20/2018 0740   ALKPHOS 66 06/28/2017 1236   AST 18 04/20/2018 0740   AST 21 03/21/2018 0814   AST 40 (H) 06/28/2017 1236   ALT 28 04/20/2018 0740   ALT 31 03/21/2018 0814   ALT 60 (H) 06/28/2017 1236   BILITOT 1.9 (H) 04/20/2018 0740   BILITOT 2.6 (H) 03/21/2018 0814   BILITOT 2.87 (H) 06/28/2017 1236       RADIOGRAPHIC STUDIES: No results found.  ASSESSMENT AND PLAN:  This is a very pleasant 47 years old white male with paroxysmal nocturnal hemoglobinuria and currently on treatment with Soliris on days 1 and 15 every 4 weeks status post 45 cycles. The patient continues to tolerate his treatment well.  CBC today is unremarkable except for mild thrombocytopenia I recommended for him to proceed with his dose today as scheduled I will see him back for follow-up visit in 2 weeks for evaluation before starting cycle #46. The patient was advised to call immediately if he has any concerning symptoms in the interval. The patient voices understanding of current disease status and treatment options and is in agreement with the current care plan. All questions were answered. The patient knows to call the clinic with any problems, questions or concerns. We can certainly see the patient much sooner if necessary. I spent 10 minutes counseling the patient face to face. The total time spent in the appointment was 15 minutes.  Disclaimer: This note was dictated with voice recognition software. Similar sounding words can inadvertently be  transcribed and may not be corrected upon review.

## 2018-05-05 ENCOUNTER — Other Ambulatory Visit: Payer: PRIVATE HEALTH INSURANCE

## 2018-05-05 ENCOUNTER — Ambulatory Visit: Payer: PRIVATE HEALTH INSURANCE | Admitting: Internal Medicine

## 2018-05-06 ENCOUNTER — Ambulatory Visit: Payer: PRIVATE HEALTH INSURANCE | Admitting: Oncology

## 2018-05-06 ENCOUNTER — Other Ambulatory Visit: Payer: PRIVATE HEALTH INSURANCE

## 2018-05-09 ENCOUNTER — Telehealth: Payer: Self-pay | Admitting: *Deleted

## 2018-05-09 ENCOUNTER — Telehealth: Payer: Self-pay | Admitting: Medical Oncology

## 2018-05-09 NOTE — Telephone Encounter (Signed)
Notified pt of Rx at pharmacy and instructions via VM

## 2018-05-09 NOTE — Telephone Encounter (Signed)
Calling about labs and  rx. Pt received a message about starting rx for prednisone-40 mg/day x 2 weeks . He got  10 mg tablets ( pharmacy did not have 20 mg tab) -he is taking 40 mg /day for 2 weeks.

## 2018-05-18 ENCOUNTER — Inpatient Hospital Stay: Payer: BLUE CROSS/BLUE SHIELD

## 2018-05-18 ENCOUNTER — Encounter: Payer: Self-pay | Admitting: Internal Medicine

## 2018-05-18 ENCOUNTER — Inpatient Hospital Stay: Payer: BLUE CROSS/BLUE SHIELD | Admitting: Internal Medicine

## 2018-05-18 VITALS — BP 128/78 | HR 84 | Temp 98.9°F | Resp 17 | Ht 66.0 in | Wt 170.1 lb

## 2018-05-18 DIAGNOSIS — Z5111 Encounter for antineoplastic chemotherapy: Secondary | ICD-10-CM

## 2018-05-18 DIAGNOSIS — D595 Paroxysmal nocturnal hemoglobinuria [Marchiafava-Micheli]: Secondary | ICD-10-CM | POA: Diagnosis not present

## 2018-05-18 LAB — CBC WITH DIFFERENTIAL/PLATELET
Abs Immature Granulocytes: 0.04 10*3/uL (ref 0.00–0.07)
BASOS PCT: 0 %
Basophils Absolute: 0 10*3/uL (ref 0.0–0.1)
EOS ABS: 0 10*3/uL (ref 0.0–0.5)
Eosinophils Relative: 0 %
HCT: 38.1 % — ABNORMAL LOW (ref 39.0–52.0)
Hemoglobin: 12.5 g/dL — ABNORMAL LOW (ref 13.0–17.0)
Immature Granulocytes: 1 %
Lymphocytes Relative: 13 %
Lymphs Abs: 0.8 10*3/uL (ref 0.7–4.0)
MCH: 36 pg — AB (ref 26.0–34.0)
MCHC: 32.8 g/dL (ref 30.0–36.0)
MCV: 109.8 fL — AB (ref 80.0–100.0)
MONO ABS: 0.4 10*3/uL (ref 0.1–1.0)
Monocytes Relative: 6 %
NEUTROS ABS: 5.3 10*3/uL (ref 1.7–7.7)
Neutrophils Relative %: 80 %
PLATELETS: 142 10*3/uL — AB (ref 150–400)
RBC: 3.47 MIL/uL — ABNORMAL LOW (ref 4.22–5.81)
RDW: 18.6 % — AB (ref 11.5–15.5)
WBC: 6.6 10*3/uL (ref 4.0–10.5)
nRBC: 0 % (ref 0.0–0.2)

## 2018-05-18 LAB — COMPREHENSIVE METABOLIC PANEL
ALT: 62 U/L — ABNORMAL HIGH (ref 0–44)
AST: 27 U/L (ref 15–41)
Albumin: 4 g/dL (ref 3.5–5.0)
Alkaline Phosphatase: 58 U/L (ref 38–126)
Anion gap: 9 (ref 5–15)
BILIRUBIN TOTAL: 2.6 mg/dL — AB (ref 0.3–1.2)
BUN: 16 mg/dL (ref 6–20)
CALCIUM: 9.3 mg/dL (ref 8.9–10.3)
CO2: 26 mmol/L (ref 22–32)
Chloride: 102 mmol/L (ref 98–111)
Creatinine, Ser: 0.99 mg/dL (ref 0.61–1.24)
GFR calc Af Amer: >60 mL/min (ref >60)
GFR calc non Af Amer: >60 mL/min (ref >60)
Glucose, Bld: 96 mg/dL (ref 70–99)
POTASSIUM: 4 mmol/L (ref 3.5–5.1)
Sodium: 137 mmol/L (ref 135–145)
TOTAL PROTEIN: 6.6 g/dL (ref 6.5–8.1)

## 2018-05-18 LAB — LACTATE DEHYDROGENASE: LDH: 227 U/L — ABNORMAL HIGH (ref 98–192)

## 2018-05-18 MED ORDER — PREDNISONE 20 MG PO TABS: ORAL_TABLET | ORAL | 0 refills | Status: DC

## 2018-05-18 NOTE — Progress Notes (Signed)
[place] Telephone:(336) [phone]   Fax:(336) [phone]  OFFICE PROGRESS NOTE  [name], MD [address] 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  PRIOR THERAPY: The patient was previously treated with a tapering dose of prednisone and high-dose IVIG for the hemolytic anemia before his diagnosis of paroxysmal nocturnal hemoglobinuria.  CURRENT THERAPY: Soliris (Eculizumab) initially with induction 600 MG IV weekly for 4 weeks and currently on maintenance treatment 900 MG IV every 2 weeks. First dose of treatment was given on 11/21/2014. Status post 45 cycles.  INTERVAL HISTORY: Travis Mcdaniel 47 y.o. male returns to the clinic today for follow-up visit.  The patient is feeling fine today with no concerning complaints.  He has more fatigue and weakness after the last dose of his treatment.  He was restarted on prednisone again 20 mg p.o. daily and he felt much better.  He denied having any current chest pain, shortness of breath, cough or hemoptysis.  He denied having any bleeding issues.  He has no nausea, vomiting, diarrhea or constipation.  He is here today for evaluation with repeat blood work before starting cycle #46.   MEDICAL HISTORY: Past Medical History:  Diagnosis Date  . Cancer (HCC)    PNH (Bone marrow)  . Diverticulosis   . Encounter for antineoplastic chemotherapy 01/15/2016  . Hypertension     ALLERGIES:  has No Known Allergies.  MEDICATIONS:  Current Outpatient Medications  Medication Sig Dispense Refill  . ALPRAZolam (XANAX) 1 MG tablet Take 1 mg by mouth 3 (three) times daily.  2  . aspirin EC 81 MG tablet Take 81 mg by mouth.    . Eculizumab (SOLIRIS IV) Inject 900 mg into the vein every 14 (fourteen) days. [facility]    . lisinopril-hydrochlorothiazide (PRINZIDE,ZESTORETIC) 20-25 MG tablet Take 1 tablet by  mouth daily.    . Multiple Vitamin (MULTIVITAMIN WITH MINERALS) TABS tablet Take 1 tablet by mouth daily.    . predniSONE (DELTASONE) 20 MG tablet 2 tab po qd for the next 2 weeks, then will taper. 40 tablet 0  . Psyllium (METAMUCIL PO) Take 1 capsule by mouth daily.    [name] Kitchen tetrahydrozoline 0.05 % ophthalmic solution Place 1 drop into both eyes 2 (two) times daily as needed (eye irritation).    . traMADol (ULTRAM) 50 MG tablet Take by mouth every 6 (six) hours as needed.     No current facility-administered medications for this visit.     SURGICAL HISTORY: No past surgical history on file.  REVIEW OF SYSTEMS:  A comprehensive review of systems was negative except for: Constitutional: positive for fatigue   PHYSICAL EXAMINATION: General appearance: alert, cooperative, fatigued and no distress Head: Normocephalic, without obvious abnormality, atraumatic Neck: no adenopathy, no JVD, supple, symmetrical, trachea midline and thyroid not enlarged, symmetric, no tenderness/mass/nodules Lymph nodes: Cervical, supraclavicular, and axillary nodes normal. Resp: clear to auscultation bilaterally Back: symmetric, no curvature. ROM normal. No CVA tenderness. Cardio: regular rate and rhythm, S1, S2 normal, no murmur, click, rub or gallop GI: soft, non-tender; bowel sounds normal; no masses,  no organomegaly Extremities: extremities normal, atraumatic, no cyanosis or edema  ECOG PERFORMANCE STATUS: 1 - Symptomatic but completely ambulatory  Blood pressure 128/78, pulse 84, temperature 98.9 F (37.2 C), temperature source Oral, resp. rate 17, height 5\' 6"  (1.676 m), weight 170 lb 1.6 oz (77.2 kg), SpO2 100 %.  LABORATORY DATA: Lab Results  Component Value Date   WBC 6.6 05/18/2018   HGB 12.5 (L) 05/18/2018   HCT 38.1 (L) 05/18/2018   MCV 109.8 (H) 05/18/2018   PLT 142 (L) 05/18/2018      Chemistry      Component Value Date/Time   NA 138 05/04/2018 0735   NA 136 06/28/2017 1236   K 3.6  05/04/2018 0735   K 4.1 06/28/2017 1236   CL 102 05/04/2018 0735   CO2 25 05/04/2018 0735   CO2 24 06/28/2017 1236   BUN 18 05/04/2018 0735   BUN 17.1 06/28/2017 1236   CREATININE 0.99 05/04/2018 0735   CREATININE 0.93 03/21/2018 0814   CREATININE 1.0 06/28/2017 1236      Component Value Date/Time   CALCIUM 9.5 05/04/2018 0735   CALCIUM 9.1 06/28/2017 1236   ALKPHOS 53 05/04/2018 0735   ALKPHOS 66 06/28/2017 1236   AST 54 (H) 05/04/2018 0735   AST 21 03/21/2018 0814   AST 40 (H) 06/28/2017 1236   ALT 82 (H) 05/04/2018 0735   ALT 31 03/21/2018 0814   ALT 60 (H) 06/28/2017 1236   BILITOT 3.8 (HH) 05/04/2018 0735   BILITOT 2.6 (H) 03/21/2018 0814   BILITOT 2.87 (H) 06/28/2017 1236       RADIOGRAPHIC STUDIES: No results found.  ASSESSMENT AND PLAN:  This is a very pleasant 47 years old white male with paroxysmal nocturnal hemoglobinuria and currently on treatment with Soliris on days 1 and 15 every 4 weeks status post 45 cycles. The patient tolerated the last cycle of his treatment well with no concerning complaints except for mild fatigue. I recommended for him to proceed with cycle #46 today as scheduled. For the hemolytic anemia, he will continue on prednisone 20 mg p.o. daily for now. I will see the patient back for follow-up visit in 2 weeks for evaluation before the next dose of his treatment. He was advised to call immediately if he has any concerning symptoms in the interval. The patient voices understanding of current disease status and treatment options and is in agreement with the current care plan. All questions were answered. The patient knows to call the clinic with any problems, questions or concerns. We can certainly see the patient much sooner if necessary. I spent 10 minutes counseling the patient face to face. The total time spent in the appointment was 15 minutes.  Disclaimer: This note was dictated with voice recognition software. Similar sounding words can  inadvertently be transcribed and may not be corrected upon review.

## 2018-05-19 ENCOUNTER — Telehealth: Payer: Self-pay | Admitting: Medical Oncology

## 2018-05-19 ENCOUNTER — Other Ambulatory Visit: Payer: PRIVATE HEALTH INSURANCE

## 2018-05-19 NOTE — Telephone Encounter (Signed)
What is prednisone dose?   LVM with instructions to take 2 tab  ( 20 mg tab) -40 mg /day for 2 weeks.

## 2018-05-20 ENCOUNTER — Telehealth: Payer: Self-pay | Admitting: *Deleted

## 2018-05-20 ENCOUNTER — Other Ambulatory Visit: Payer: PRIVATE HEALTH INSURANCE

## 2018-05-20 NOTE — Telephone Encounter (Signed)
Clarified with MD- Prednisone 20mg  tablet take 1 po daily until MD visit 12/4. Called pt lmovm with instructions 1 20mg  tablet PO daily until pt seen by MD. Request pt call back to confirm message rcvd.

## 2018-05-27 ENCOUNTER — Other Ambulatory Visit: Payer: Self-pay | Admitting: Internal Medicine

## 2018-05-31 ENCOUNTER — Telehealth: Payer: Self-pay | Admitting: Medical Oncology

## 2018-05-31 NOTE — Telephone Encounter (Signed)
Need new orders for treatment. His current orders expire in 7 days

## 2018-06-01 ENCOUNTER — Inpatient Hospital Stay: Payer: BLUE CROSS/BLUE SHIELD

## 2018-06-01 ENCOUNTER — Inpatient Hospital Stay: Payer: BLUE CROSS/BLUE SHIELD | Attending: Internal Medicine | Admitting: Internal Medicine

## 2018-06-01 ENCOUNTER — Telehealth: Payer: Self-pay | Admitting: *Deleted

## 2018-06-01 ENCOUNTER — Encounter: Payer: Self-pay | Admitting: Internal Medicine

## 2018-06-01 ENCOUNTER — Other Ambulatory Visit: Payer: Self-pay

## 2018-06-01 VITALS — BP 143/77 | HR 97 | Temp 98.5°F | Resp 18 | Ht 66.0 in | Wt 168.8 lb

## 2018-06-01 DIAGNOSIS — I10 Essential (primary) hypertension: Secondary | ICD-10-CM | POA: Diagnosis not present

## 2018-06-01 DIAGNOSIS — D595 Paroxysmal nocturnal hemoglobinuria [Marchiafava-Micheli]: Secondary | ICD-10-CM | POA: Diagnosis not present

## 2018-06-01 DIAGNOSIS — Z5111 Encounter for antineoplastic chemotherapy: Secondary | ICD-10-CM

## 2018-06-01 DIAGNOSIS — D696 Thrombocytopenia, unspecified: Secondary | ICD-10-CM

## 2018-06-01 LAB — CBC WITH DIFFERENTIAL/PLATELET
Abs Immature Granulocytes: 0.03 10*3/uL (ref 0.00–0.07)
BASOS ABS: 0 10*3/uL (ref 0.0–0.1)
Basophils Relative: 0 %
EOS ABS: 0 10*3/uL (ref 0.0–0.5)
EOS PCT: 1 %
HEMATOCRIT: 38.1 % — AB (ref 39.0–52.0)
Hemoglobin: 12.7 g/dL — ABNORMAL LOW (ref 13.0–17.0)
Immature Granulocytes: 1 %
LYMPHS ABS: 0.9 10*3/uL (ref 0.7–4.0)
Lymphocytes Relative: 15 %
MCH: 36.5 pg — ABNORMAL HIGH (ref 26.0–34.0)
MCHC: 33.3 g/dL (ref 30.0–36.0)
MCV: 109.5 fL — ABNORMAL HIGH (ref 80.0–100.0)
Monocytes Absolute: 0.4 10*3/uL (ref 0.1–1.0)
Monocytes Relative: 6 %
NRBC: 0 % (ref 0.0–0.2)
Neutro Abs: 5 10*3/uL (ref 1.7–7.7)
Neutrophils Relative %: 77 %
Platelets: 150 10*3/uL (ref 150–400)
RBC: 3.48 MIL/uL — AB (ref 4.22–5.81)
RDW: 14.8 % (ref 11.5–15.5)
WBC: 6.5 10*3/uL (ref 4.0–10.5)

## 2018-06-01 LAB — COMPREHENSIVE METABOLIC PANEL
ALK PHOS: 60 U/L (ref 38–126)
ALT: 61 U/L — AB (ref 0–44)
AST: 37 U/L (ref 15–41)
Albumin: 4.1 g/dL (ref 3.5–5.0)
Anion gap: 13 (ref 5–15)
BUN: 16 mg/dL (ref 6–20)
CALCIUM: 9.5 mg/dL (ref 8.9–10.3)
CO2: 23 mmol/L (ref 22–32)
CREATININE: 1.04 mg/dL (ref 0.61–1.24)
Chloride: 101 mmol/L (ref 98–111)
GFR calc non Af Amer: >60 mL/min (ref >60)
Glucose, Bld: 110 mg/dL — ABNORMAL HIGH (ref 70–99)
Potassium: 3.8 mmol/L (ref 3.5–5.1)
Sodium: 137 mmol/L (ref 135–145)
Total Bilirubin: 3.1 mg/dL — ABNORMAL HIGH (ref 0.3–1.2)
Total Protein: 7 g/dL (ref 6.5–8.1)

## 2018-06-01 LAB — LACTATE DEHYDROGENASE: LDH: 256 U/L — ABNORMAL HIGH (ref 98–192)

## 2018-06-01 NOTE — Telephone Encounter (Signed)
LMOVM for pt :Notified pt to stay on 20mg  prednisone po daily until he sees MD again.  Requested pt call back to confirm message received.

## 2018-06-01 NOTE — Progress Notes (Signed)
[facility] Telephone:([phone]   Fax:(336) [phone]  OFFICE PROGRESS NOTE  [name], MD [address] 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  PRIOR THERAPY: The patient was previously treated with a tapering dose of prednisone and high-dose IVIG for the hemolytic anemia before his diagnosis of paroxysmal nocturnal hemoglobinuria.  CURRENT THERAPY: Soliris (Eculizumab) initially with induction 600 MG IV weekly for 4 weeks and currently on maintenance treatment 900 MG IV every 2 weeks. First dose of treatment was given on 11/21/2014. Status post 45 cycles.  INTERVAL HISTORY: Travis Mcdaniel 47 y.o. male returns to the clinic today for follow-up visit.  The patient is feeling better today with no concerning complaints.  He denied having any fatigue or weakness.  He denied having any chest pain, shortness breath, cough or hemoptysis.  He has no nausea, vomiting, diarrhea or constipation.  He continues to tolerate his treatment with Solaris fairly well.  MEDICAL HISTORY: Past Medical History:  Diagnosis Date  . Cancer (HCC)    PNH (Bone marrow)  . Diverticulosis   . Encounter for antineoplastic chemotherapy 01/15/2016  . Hypertension     ALLERGIES:  has No Known Allergies.  MEDICATIONS:  Current Outpatient Medications  Medication Sig Dispense Refill  . ALPRAZolam (XANAX) 1 MG tablet Take 1 mg by mouth 3 (three) times daily.  2  . aspirin EC 81 MG tablet Take 81 mg by mouth.    . Eculizumab (SOLIRIS IV) Inject 900 mg into the vein every 14 (fourteen) days. [facility]    . lisinopril-hydrochlorothiazide (PRINZIDE,ZESTORETIC) 20-25 MG tablet Take 1 tablet by mouth daily.    . Multiple Vitamin (MULTIVITAMIN WITH MINERALS) TABS tablet Take 1 tablet by mouth daily.    . predniSONE (DELTASONE) 10 MG tablet TAKE 6 TABS DAILY FOR 1 WK, 4  TABS DAILY FOR 1 WK, 2 TABS DAILY FOR 1 WK, 1 TAB DAILY FOR 1 WK 90 tablet 0  . predniSONE (DELTASONE) 20 MG tablet 2 tab po qd for the next 2 weeks, then will taper. 40 tablet 0  . Psyllium (METAMUCIL PO) Take 1 capsule by mouth daily.    [name] Kitchen tetrahydrozoline 0.05 % ophthalmic solution Place 1 drop into both eyes 2 (two) times daily as needed (eye irritation).    . traMADol (ULTRAM) 50 MG tablet Take by mouth every 6 (six) hours as needed.     No current facility-administered medications for this visit.     SURGICAL HISTORY: No past surgical history on file.  REVIEW OF SYSTEMS:  A comprehensive review of systems was negative except for: Constitutional: positive for fatigue   PHYSICAL EXAMINATION: General appearance: alert, cooperative and no distress Head: Normocephalic, without obvious abnormality, atraumatic Neck: no adenopathy, no JVD, supple, symmetrical, trachea midline and thyroid not enlarged, symmetric, no tenderness/mass/nodules Lymph nodes: Cervical, supraclavicular, and axillary nodes normal. Resp: clear to auscultation bilaterally Back: symmetric, no curvature. ROM normal. No CVA tenderness. Cardio: regular rate and rhythm, S1, S2 normal, no murmur, click, rub or gallop GI: soft, non-tender; bowel sounds normal; no masses,  no organomegaly Extremities: extremities normal, atraumatic, no cyanosis or edema  ECOG PERFORMANCE STATUS: 1 - Symptomatic but completely ambulatory  Blood pressure (!) 143/77, pulse 97, temperature 98.5 F (36.9 C), temperature source Oral, resp. rate 18, height 5\' 6"  (1.676 m), weight 168 lb 12.8 oz (76.6 kg), SpO2 100 %.  LABORATORY  DATA: Lab Results  Component Value Date   WBC 6.6 05/18/2018   HGB 12.5 (L) 05/18/2018   HCT 38.1 (L) 05/18/2018   MCV 109.8 (H) 05/18/2018   PLT 142 (L) 05/18/2018      Chemistry      Component Value Date/Time   NA 137 05/18/2018 0736   NA 136 06/28/2017 1236   K 4.0 05/18/2018 0736   K 4.1 06/28/2017 1236    CL 102 05/18/2018 0736   CO2 26 05/18/2018 0736   CO2 24 06/28/2017 1236   BUN 16 05/18/2018 0736   BUN 17.1 06/28/2017 1236   CREATININE 0.99 05/18/2018 0736   CREATININE 0.93 03/21/2018 0814   CREATININE 1.0 06/28/2017 1236      Component Value Date/Time   CALCIUM 9.3 05/18/2018 0736   CALCIUM 9.1 06/28/2017 1236   ALKPHOS 58 05/18/2018 0736   ALKPHOS 66 06/28/2017 1236   AST 27 05/18/2018 0736   AST 21 03/21/2018 0814   AST 40 (H) 06/28/2017 1236   ALT 62 (H) 05/18/2018 0736   ALT 31 03/21/2018 0814   ALT 60 (H) 06/28/2017 1236   BILITOT 2.6 (H) 05/18/2018 0736   BILITOT 2.6 (H) 03/21/2018 0814   BILITOT 2.87 (H) 06/28/2017 1236       RADIOGRAPHIC STUDIES: No results found.  ASSESSMENT AND PLAN:  This is a very pleasant 47 years old white male with paroxysmal nocturnal hemoglobinuria and currently on treatment with Soliris on days 1 and 15 every 4 weeks status post 45 cycles.  The patient continues to tolerate this treatment well. I recommended for him to continue with day 15 of cycle #46 today. He will continue on the taper dose of prednisone.  He is currently on 20 mg p.o. daily for the next 4 days then he will go down to 10 mg p.o. daily for 2 weeks until I see him. The patient was advised to call immediately if he has any concerning symptoms in the interval. The patient voices understanding of current disease status and treatment options and is in agreement with the current care plan. All questions were answered. The patient knows to call the clinic with any problems, questions or concerns. We can certainly see the patient much sooner if necessary. I spent 10 minutes counseling the patient face to face. The total time spent in the appointment was 15 minutes.  Disclaimer: This note was dictated with voice recognition software. Similar sounding words can inadvertently be transcribed and may not be corrected upon review.

## 2018-06-01 NOTE — Telephone Encounter (Signed)
Please copy the previous order and I will be happy to sign it.

## 2018-06-02 ENCOUNTER — Ambulatory Visit: Payer: PRIVATE HEALTH INSURANCE | Admitting: Internal Medicine

## 2018-06-02 ENCOUNTER — Other Ambulatory Visit: Payer: PRIVATE HEALTH INSURANCE

## 2018-06-03 ENCOUNTER — Other Ambulatory Visit: Payer: PRIVATE HEALTH INSURANCE

## 2018-06-03 ENCOUNTER — Ambulatory Visit: Payer: PRIVATE HEALTH INSURANCE | Admitting: Oncology

## 2018-06-15 ENCOUNTER — Telehealth: Payer: Self-pay | Admitting: Medical Oncology

## 2018-06-15 ENCOUNTER — Encounter: Payer: Self-pay | Admitting: Internal Medicine

## 2018-06-15 ENCOUNTER — Inpatient Hospital Stay: Payer: BLUE CROSS/BLUE SHIELD

## 2018-06-15 ENCOUNTER — Inpatient Hospital Stay: Payer: BLUE CROSS/BLUE SHIELD | Admitting: Internal Medicine

## 2018-06-15 VITALS — BP 121/78 | HR 77 | Temp 98.2°F | Resp 18 | Ht 66.0 in | Wt 171.3 lb

## 2018-06-15 DIAGNOSIS — D595 Paroxysmal nocturnal hemoglobinuria [Marchiafava-Micheli]: Secondary | ICD-10-CM | POA: Diagnosis not present

## 2018-06-15 DIAGNOSIS — I10 Essential (primary) hypertension: Secondary | ICD-10-CM | POA: Diagnosis not present

## 2018-06-15 DIAGNOSIS — F1020 Alcohol dependence, uncomplicated: Secondary | ICD-10-CM

## 2018-06-15 DIAGNOSIS — Z5111 Encounter for antineoplastic chemotherapy: Secondary | ICD-10-CM

## 2018-06-15 DIAGNOSIS — D696 Thrombocytopenia, unspecified: Secondary | ICD-10-CM

## 2018-06-15 LAB — COMPREHENSIVE METABOLIC PANEL
ALK PHOS: 51 U/L (ref 38–126)
ALT: 67 U/L — ABNORMAL HIGH (ref 0–44)
AST: 35 U/L (ref 15–41)
Albumin: 4.2 g/dL (ref 3.5–5.0)
Anion gap: 12 (ref 5–15)
BUN: 11 mg/dL (ref 6–20)
CO2: 25 mmol/L (ref 22–32)
Calcium: 9.3 mg/dL (ref 8.9–10.3)
Chloride: 103 mmol/L (ref 98–111)
Creatinine, Ser: 0.9 mg/dL (ref 0.61–1.24)
GFR calc Af Amer: >60 mL/min (ref >60)
GFR calc non Af Amer: >60 mL/min (ref >60)
Glucose, Bld: 82 mg/dL (ref 70–99)
Potassium: 3.6 mmol/L (ref 3.5–5.1)
SODIUM: 140 mmol/L (ref 135–145)
Total Bilirubin: 2.3 mg/dL — ABNORMAL HIGH (ref 0.3–1.2)
Total Protein: 6.9 g/dL (ref 6.5–8.1)

## 2018-06-15 LAB — CBC WITH DIFFERENTIAL/PLATELET
Abs Immature Granulocytes: 0.01 10*3/uL (ref 0.00–0.07)
Basophils Absolute: 0 10*3/uL (ref 0.0–0.1)
Basophils Relative: 0 %
Eosinophils Absolute: 0.1 10*3/uL (ref 0.0–0.5)
Eosinophils Relative: 1 %
HEMATOCRIT: 41.1 % (ref 39.0–52.0)
Hemoglobin: 13.4 g/dL (ref 13.0–17.0)
IMMATURE GRANULOCYTES: 0 %
Lymphocytes Relative: 30 %
Lymphs Abs: 1.5 10*3/uL (ref 0.7–4.0)
MCH: 36.4 pg — ABNORMAL HIGH (ref 26.0–34.0)
MCHC: 32.6 g/dL (ref 30.0–36.0)
MCV: 111.7 fL — ABNORMAL HIGH (ref 80.0–100.0)
Monocytes Absolute: 0.5 10*3/uL (ref 0.1–1.0)
Monocytes Relative: 10 %
Neutro Abs: 2.9 10*3/uL (ref 1.7–7.7)
Neutrophils Relative %: 59 %
Platelets: 131 10*3/uL — ABNORMAL LOW (ref 150–400)
RBC: 3.68 MIL/uL — ABNORMAL LOW (ref 4.22–5.81)
RDW: 14.5 % (ref 11.5–15.5)
WBC: 4.9 10*3/uL (ref 4.0–10.5)
nRBC: 0 % (ref 0.0–0.2)

## 2018-06-15 LAB — LACTATE DEHYDROGENASE: LDH: 216 U/L — ABNORMAL HIGH (ref 98–192)

## 2018-06-15 NOTE — Progress Notes (Signed)
[place] [phone]   Fax:(336) [phone]  OFFICE PROGRESS NOTE  Travis Reach, MD [address] 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  PRIOR THERAPY: The patient was previously treated with a tapering dose of prednisone and high-dose IVIG for the hemolytic anemia before his diagnosis of paroxysmal nocturnal hemoglobinuria.  CURRENT THERAPY: Soliris (Eculizumab) initially with induction 600 MG IV weekly for 4 weeks and currently on maintenance treatment 900 MG IV every 2 weeks. First dose of treatment was given on 11/21/2014. Status post 45 cycles.  After cycle #45 the patient has been receiving his treatment at outside infusion center.  Status post 12 more cycles of treatment.  INTERVAL HISTORY: Travis Mcdaniel 47 y.o. male returns to the clinic today for follow-up visit.  The patient is feeling fine today with no concerning complaints.  He is currently on prednisone 10 mg p.o. daily and tolerating it well.  He denied having any current chest pain, shortness of breath, cough or hemoptysis.  He denied having any fever or chills.  He has no nausea, vomiting, diarrhea or constipation.  The patient denied having any headache or visual changes.  He is here today for evaluation and repeat blood work.  MEDICAL HISTORY: Past Medical History:  Diagnosis Date  . Cancer (HCC)    PNH (Bone marrow)  . Diverticulosis   . Encounter for antineoplastic chemotherapy 01/15/2016  . Hypertension     ALLERGIES:  has No Known Allergies.  MEDICATIONS:  Current Outpatient Medications  Medication Sig Dispense Refill  . ALPRAZolam (XANAX) 1 MG tablet Take 1 mg by mouth 3 (three) times daily.  2  . aspirin EC 81 MG tablet Take 81 mg by mouth.    . Eculizumab (SOLIRIS IV) Inject 900 mg into the vein every 14 (fourteen) days. [facility]    .  lisinopril-hydrochlorothiazide (PRINZIDE,ZESTORETIC) 20-25 MG tablet Take 1 tablet by mouth daily.    . Multiple Vitamin (MULTIVITAMIN WITH MINERALS) TABS tablet Take 1 tablet by mouth daily.    . predniSONE (DELTASONE) 10 MG tablet TAKE 6 TABS DAILY FOR 1 WK, 4 TABS DAILY FOR 1 WK, 2 TABS DAILY FOR 1 WK, 1 TAB DAILY FOR 1 WK 90 tablet 0  . predniSONE (DELTASONE) 20 MG tablet 2 tab po qd for the next 2 weeks, then will taper. 40 tablet 0  . Psyllium (METAMUCIL PO) Take 1 capsule by mouth daily.    [name] Kitchen tetrahydrozoline 0.05 % ophthalmic solution Place 1 drop into both eyes 2 (two) times daily as needed (eye irritation).    . traMADol (ULTRAM) 50 MG tablet Take by mouth every 6 (six) hours as needed.     No current facility-administered medications for this visit.     SURGICAL HISTORY: No past surgical history on file.  REVIEW OF SYSTEMS:  A comprehensive review of systems was negative.   PHYSICAL EXAMINATION: General appearance: alert, cooperative and no distress Head: Normocephalic, without obvious abnormality, atraumatic Neck: no adenopathy, no JVD, supple, symmetrical, trachea midline and thyroid not enlarged, symmetric, no tenderness/mass/nodules Lymph nodes: Cervical, supraclavicular, and axillary nodes normal. Resp: clear to auscultation bilaterally Back: symmetric, no curvature. ROM normal. No CVA tenderness. Cardio: regular rate and rhythm, S1, S2 normal, no murmur, click, rub or gallop GI: soft, non-tender; bowel sounds normal; no masses,  no organomegaly Extremities: extremities normal, atraumatic, no cyanosis or edema  ECOG  PERFORMANCE STATUS: 1 - Symptomatic but completely ambulatory  Blood pressure 121/78, pulse 77, temperature 98.2 F (36.8 C), temperature source Oral, resp. rate 18, height 5\' 6"  (1.676 m), weight 171 lb 4.8 oz (77.7 kg), SpO2 100 %.  LABORATORY DATA: Lab Results  Component Value Date   WBC 4.9 06/15/2018   HGB 13.4 06/15/2018   HCT 41.1 06/15/2018    MCV 111.7 (H) 06/15/2018   PLT 131 (L) 06/15/2018      Chemistry      Component Value Date/Time   NA 137 06/01/2018 0737   NA 136 06/28/2017 1236   K 3.8 06/01/2018 0737   K 4.1 06/28/2017 1236   CL 101 06/01/2018 0737   CO2 23 06/01/2018 0737   CO2 24 06/28/2017 1236   BUN 16 06/01/2018 0737   BUN 17.1 06/28/2017 1236   CREATININE 1.04 06/01/2018 0737   CREATININE 0.93 03/21/2018 0814   CREATININE 1.0 06/28/2017 1236      Component Value Date/Time   CALCIUM 9.5 06/01/2018 0737   CALCIUM 9.1 06/28/2017 1236   ALKPHOS 60 06/01/2018 0737   ALKPHOS 66 06/28/2017 1236   AST 37 06/01/2018 0737   AST 21 03/21/2018 0814   AST 40 (H) 06/28/2017 1236   ALT 61 (H) 06/01/2018 0737   ALT 31 03/21/2018 0814   ALT 60 (H) 06/28/2017 1236   BILITOT 3.1 (H) 06/01/2018 0737   BILITOT 2.6 (H) 03/21/2018 0814   BILITOT 2.87 (H) 06/28/2017 1236       RADIOGRAPHIC STUDIES: No results found.  ASSESSMENT AND PLAN:  This is a very pleasant 47 years old white male with paroxysmal nocturnal hemoglobinuria and currently on treatment with Soliris on days 1 and 15 every 4 weeks status post 45 cycles.  He is also on the same treatment received at outside infusion center status post 12 more cycles. He continues to tolerate the treatment well except for the recent hemolytic episode and he is currently on a taper dose of prednisone.  He is currently on prednisone 10 mg p.o. daily. I recommended for the patient to continue his treatment with Solaris. I will continue to monitor him closely and he will come back for follow-up visit in 2 weeks for evaluation and repeat blood work before his next infusion treatment. The patient was advised to call immediately if he has any concerning symptoms in the interval. The patient voices understanding of current disease status and treatment options and is in agreement with the current care plan. All questions were answered. The patient knows to call the clinic with  any problems, questions or concerns. We can certainly see the patient much sooner if necessary. I spent 10 minutes counseling the patient face to face. The total time spent in the appointment was 15 minutes.  Disclaimer: This note was dictated with voice recognition software. Similar sounding words can inadvertently be transcribed and may not be corrected upon review.

## 2018-06-15 NOTE — Telephone Encounter (Signed)
I instructed pt to take prednisone 10 mg /day.

## 2018-06-16 ENCOUNTER — Other Ambulatory Visit: Payer: PRIVATE HEALTH INSURANCE

## 2018-06-16 ENCOUNTER — Telehealth: Payer: Self-pay | Admitting: Internal Medicine

## 2018-06-16 NOTE — Telephone Encounter (Signed)
Appts already scheduled per 12/18 los - no additional appts added

## 2018-06-17 ENCOUNTER — Other Ambulatory Visit: Payer: PRIVATE HEALTH INSURANCE

## 2018-06-27 ENCOUNTER — Other Ambulatory Visit: Payer: Self-pay

## 2018-06-27 ENCOUNTER — Inpatient Hospital Stay: Payer: BLUE CROSS/BLUE SHIELD

## 2018-06-27 ENCOUNTER — Encounter: Payer: Self-pay | Admitting: Internal Medicine

## 2018-06-27 ENCOUNTER — Inpatient Hospital Stay (HOSPITAL_BASED_OUTPATIENT_CLINIC_OR_DEPARTMENT_OTHER): Payer: BLUE CROSS/BLUE SHIELD | Admitting: Internal Medicine

## 2018-06-27 VITALS — BP 137/79 | HR 77 | Temp 98.6°F | Resp 18 | Ht 66.0 in | Wt 171.1 lb

## 2018-06-27 DIAGNOSIS — D595 Paroxysmal nocturnal hemoglobinuria [Marchiafava-Micheli]: Secondary | ICD-10-CM

## 2018-06-27 DIAGNOSIS — Z5111 Encounter for antineoplastic chemotherapy: Secondary | ICD-10-CM

## 2018-06-27 DIAGNOSIS — D696 Thrombocytopenia, unspecified: Secondary | ICD-10-CM

## 2018-06-27 DIAGNOSIS — I10 Essential (primary) hypertension: Secondary | ICD-10-CM

## 2018-06-27 DIAGNOSIS — D598 Other acquired hemolytic anemias: Secondary | ICD-10-CM

## 2018-06-27 LAB — CBC WITH DIFFERENTIAL/PLATELET
Abs Immature Granulocytes: 0.03 10*3/uL (ref 0.00–0.07)
Basophils Absolute: 0 10*3/uL (ref 0.0–0.1)
Basophils Relative: 1 %
EOS PCT: 2 %
Eosinophils Absolute: 0.1 10*3/uL (ref 0.0–0.5)
HCT: 45.1 % (ref 39.0–52.0)
HEMOGLOBIN: 15.4 g/dL (ref 13.0–17.0)
Immature Granulocytes: 1 %
Lymphocytes Relative: 29 %
Lymphs Abs: 1.7 10*3/uL (ref 0.7–4.0)
MCH: 36.9 pg — ABNORMAL HIGH (ref 26.0–34.0)
MCHC: 34.1 g/dL (ref 30.0–36.0)
MCV: 108.2 fL — ABNORMAL HIGH (ref 80.0–100.0)
Monocytes Absolute: 0.6 10*3/uL (ref 0.1–1.0)
Monocytes Relative: 10 %
Neutro Abs: 3.5 10*3/uL (ref 1.7–7.7)
Neutrophils Relative %: 57 %
Platelets: 128 10*3/uL — ABNORMAL LOW (ref 150–400)
RBC: 4.17 MIL/uL — ABNORMAL LOW (ref 4.22–5.81)
RDW: 13.1 % (ref 11.5–15.5)
WBC: 5.9 10*3/uL (ref 4.0–10.5)
nRBC: 0 % (ref 0.0–0.2)

## 2018-06-27 LAB — COMPREHENSIVE METABOLIC PANEL
ALT: 64 U/L — ABNORMAL HIGH (ref 0–44)
AST: 33 U/L (ref 15–41)
Albumin: 4.2 g/dL (ref 3.5–5.0)
Alkaline Phosphatase: 50 U/L (ref 38–126)
Anion gap: 11 (ref 5–15)
BUN: 14 mg/dL (ref 6–20)
CHLORIDE: 101 mmol/L (ref 98–111)
CO2: 26 mmol/L (ref 22–32)
CREATININE: 0.94 mg/dL (ref 0.61–1.24)
Calcium: 9.6 mg/dL (ref 8.9–10.3)
GFR calc Af Amer: >60 mL/min (ref >60)
Glucose, Bld: 116 mg/dL — ABNORMAL HIGH (ref 70–99)
Potassium: 3.5 mmol/L (ref 3.5–5.1)
Sodium: 138 mmol/L (ref 135–145)
Total Bilirubin: 2 mg/dL — ABNORMAL HIGH (ref 0.3–1.2)
Total Protein: 6.9 g/dL (ref 6.5–8.1)

## 2018-06-27 LAB — LACTATE DEHYDROGENASE: LDH: 179 U/L (ref 98–192)

## 2018-06-27 NOTE — Progress Notes (Signed)
[place] [phone]   Fax:([phone]  OFFICE PROGRESS NOTE  [name], MD [address] 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  PRIOR THERAPY: The patient was previously treated with a tapering dose of prednisone and high-dose IVIG for the hemolytic anemia before his diagnosis of paroxysmal nocturnal hemoglobinuria.  CURRENT THERAPY: Soliris (Eculizumab) initially with induction 600 MG IV weekly for 4 weeks and currently on maintenance treatment 900 MG IV every 2 weeks. First dose of treatment was given on 11/21/2014. Status post 45 cycles.  After cycle #45 the patient has been receiving his treatment at outside infusion center.  Status post 12 more cycles of treatment.  INTERVAL HISTORY: Travis Mcdaniel 47 y.o. male returns to the clinic today for follow-up visit.  The patient is feeling fine today with no concerning complaints.  He denied having any fatigue or weakness.  He denied having any nausea, vomiting, diarrhea or constipation.  He denied having any headache or visual changes.  He continues to tolerate his treatment with Solaris fairly well.  The patient is here today for evaluation and repeat blood work before starting the next cycle of his treatment.   MEDICAL HISTORY: Past Medical History:  Diagnosis Date  . Cancer (HCC)    PNH (Bone marrow)  . Diverticulosis   . Encounter for antineoplastic chemotherapy 01/15/2016  . Hypertension     ALLERGIES:  has No Known Allergies.  MEDICATIONS:  Current Outpatient Medications  Medication Sig Dispense Refill  . ALPRAZolam (XANAX) 1 MG tablet Take 1 mg by mouth 3 (three) times daily.  2  . aspirin EC 81 MG tablet Take 81 mg by mouth.    . Eculizumab (SOLIRIS IV) Inject 900 mg into the vein every 14 (fourteen) days. [facility]    . lisinopril-hydrochlorothiazide  (PRINZIDE,ZESTORETIC) 20-25 MG tablet Take 1 tablet by mouth daily.    . Multiple Vitamin (MULTIVITAMIN WITH MINERALS) TABS tablet Take 1 tablet by mouth daily.    . Multiple Vitamins-Minerals (MULTIVITAMIN ADULT PO) Take by mouth.    . predniSONE (DELTASONE) 10 MG tablet TAKE 6 TABS DAILY FOR 1 WK, 4 TABS DAILY FOR 1 WK, 2 TABS DAILY FOR 1 WK, 1 TAB DAILY FOR 1 WK 90 tablet 0  . Psyllium (METAMUCIL PO) Take 1 capsule by mouth daily.    [name] Kitchen tetrahydrozoline 0.05 % ophthalmic solution Place 1 drop into both eyes 2 (two) times daily as needed (eye irritation).    . traMADol (ULTRAM) 50 MG tablet Take by mouth every 6 (six) hours as needed.     No current facility-administered medications for this visit.     SURGICAL HISTORY: No past surgical history on file.  REVIEW OF SYSTEMS:  A comprehensive review of systems was negative.   PHYSICAL EXAMINATION: General appearance: alert, cooperative and no distress Head: Normocephalic, without obvious abnormality, atraumatic Neck: no adenopathy, no JVD, supple, symmetrical, trachea midline and thyroid not enlarged, symmetric, no tenderness/mass/nodules Lymph nodes: Cervical, supraclavicular, and axillary nodes normal. Resp: clear to auscultation bilaterally Back: symmetric, no curvature. ROM normal. No CVA tenderness. Cardio: regular rate and rhythm, S1, S2 normal, no murmur, click, rub or gallop GI: soft, non-tender; bowel sounds normal; no masses,  no organomegaly Extremities: extremities normal, atraumatic, no cyanosis or edema  ECOG PERFORMANCE STATUS: 1 - Symptomatic but completely ambulatory  Blood pressure 137/79, pulse 77, temperature 98.6 F (  37 C), temperature source Oral, resp. rate 18, height 5\' 6"  (1.676 m), weight 171 lb 1.6 oz (77.6 kg), SpO2 100 %.  LABORATORY DATA: Lab Results  Component Value Date   WBC 5.9 06/27/2018   HGB 15.4 06/27/2018   HCT 45.1 06/27/2018   MCV 108.2 (H) 06/27/2018   PLT 128 (L) 06/27/2018       Chemistry      Component Value Date/Time   NA 138 06/27/2018 0806   NA 136 06/28/2017 1236   K 3.5 06/27/2018 0806   K 4.1 06/28/2017 1236   CL 101 06/27/2018 0806   CO2 26 06/27/2018 0806   CO2 24 06/28/2017 1236   BUN 14 06/27/2018 0806   BUN 17.1 06/28/2017 1236   CREATININE 0.94 06/27/2018 0806   CREATININE 0.93 03/21/2018 0814   CREATININE 1.0 06/28/2017 1236      Component Value Date/Time   CALCIUM 9.6 06/27/2018 0806   CALCIUM 9.1 06/28/2017 1236   ALKPHOS 50 06/27/2018 0806   ALKPHOS 66 06/28/2017 1236   AST 33 06/27/2018 0806   AST 21 03/21/2018 0814   AST 40 (H) 06/28/2017 1236   ALT 64 (H) 06/27/2018 0806   ALT 31 03/21/2018 0814   ALT 60 (H) 06/28/2017 1236   BILITOT 2.0 (H) 06/27/2018 0806   BILITOT 2.6 (H) 03/21/2018 0814   BILITOT 2.87 (H) 06/28/2017 1236       RADIOGRAPHIC STUDIES: No results found.  ASSESSMENT AND PLAN:  This is a very pleasant 47 years old white male with paroxysmal nocturnal hemoglobinuria and currently on treatment with Soliris on days 1 and 15 every 4 weeks status post 45 cycles.  He is also on the same treatment received at outside infusion center status post 12 more cycles. His blood work is unremarkable today and his hemoglobin and hematocrit has significantly improved. I recommended for the patient to proceed with his treatment today as a schedule. Regarding his current treatment with prednisone, I will taper his dose to 10 mg every other day for the next 2 weeks before discontinuing the steroid completely. The patient will come back for follow-up visit in 2 weeks for evaluation before the next cycle of his treatment. He was advised to call immediately if he has any concerning symptoms in the interval. The patient voices understanding of current disease status and treatment options and is in agreement with the current care plan. All questions were answered. The patient knows to call the clinic with any problems, questions or  concerns. We can certainly see the patient much sooner if necessary. I spent 10 minutes counseling the patient face to face. The total time spent in the appointment was 15 minutes.  Disclaimer: This note was dictated with voice recognition software. Similar sounding words can inadvertently be transcribed and may not be corrected upon review.

## 2018-06-28 ENCOUNTER — Telehealth: Payer: Self-pay | Admitting: Internal Medicine

## 2018-06-28 NOTE — Telephone Encounter (Signed)
Scheduled appt per 12/30 los- pt to get an updated schedule next visit   

## 2018-06-28 NOTE — Telephone Encounter (Signed)
[place] out 1/15 - moved f/u to MM. Spoke with pateint.

## 2018-06-30 ENCOUNTER — Ambulatory Visit: Payer: PRIVATE HEALTH INSURANCE | Admitting: Internal Medicine

## 2018-06-30 ENCOUNTER — Ambulatory Visit: Payer: PRIVATE HEALTH INSURANCE | Admitting: Oncology

## 2018-06-30 ENCOUNTER — Other Ambulatory Visit: Payer: PRIVATE HEALTH INSURANCE

## 2018-06-30 ENCOUNTER — Telehealth: Payer: Self-pay | Admitting: Internal Medicine

## 2018-06-30 NOTE — Telephone Encounter (Signed)
Patient came and got a print out of the calendar.

## 2018-07-01 ENCOUNTER — Ambulatory Visit: Payer: PRIVATE HEALTH INSURANCE | Admitting: Adult Health

## 2018-07-01 ENCOUNTER — Other Ambulatory Visit: Payer: PRIVATE HEALTH INSURANCE

## 2018-07-13 ENCOUNTER — Ambulatory Visit: Payer: PRIVATE HEALTH INSURANCE | Admitting: Oncology

## 2018-07-13 ENCOUNTER — Other Ambulatory Visit: Payer: PRIVATE HEALTH INSURANCE

## 2018-07-13 ENCOUNTER — Inpatient Hospital Stay: Payer: BLUE CROSS/BLUE SHIELD | Attending: Internal Medicine

## 2018-07-13 ENCOUNTER — Encounter: Payer: Self-pay | Admitting: Internal Medicine

## 2018-07-13 ENCOUNTER — Other Ambulatory Visit: Payer: Self-pay

## 2018-07-13 ENCOUNTER — Inpatient Hospital Stay (HOSPITAL_BASED_OUTPATIENT_CLINIC_OR_DEPARTMENT_OTHER): Payer: BLUE CROSS/BLUE SHIELD | Admitting: Internal Medicine

## 2018-07-13 VITALS — BP 135/78 | HR 92 | Temp 98.8°F | Resp 19 | Ht 66.0 in | Wt 175.4 lb

## 2018-07-13 DIAGNOSIS — Z5111 Encounter for antineoplastic chemotherapy: Secondary | ICD-10-CM

## 2018-07-13 DIAGNOSIS — D595 Paroxysmal nocturnal hemoglobinuria [Marchiafava-Micheli]: Secondary | ICD-10-CM | POA: Insufficient documentation

## 2018-07-13 DIAGNOSIS — Z7982 Long term (current) use of aspirin: Secondary | ICD-10-CM | POA: Diagnosis not present

## 2018-07-13 DIAGNOSIS — Z79899 Other long term (current) drug therapy: Secondary | ICD-10-CM | POA: Insufficient documentation

## 2018-07-13 DIAGNOSIS — I10 Essential (primary) hypertension: Secondary | ICD-10-CM | POA: Insufficient documentation

## 2018-07-13 LAB — COMPREHENSIVE METABOLIC PANEL
ALT: 180 U/L — ABNORMAL HIGH (ref 0–44)
AST: 100 U/L — AB (ref 15–41)
Albumin: 4.3 g/dL (ref 3.5–5.0)
Alkaline Phosphatase: 58 U/L (ref 38–126)
Anion gap: 12 (ref 5–15)
BUN: 13 mg/dL (ref 6–20)
CO2: 24 mmol/L (ref 22–32)
Calcium: 9.5 mg/dL (ref 8.9–10.3)
Chloride: 102 mmol/L (ref 98–111)
Creatinine, Ser: 0.94 mg/dL (ref 0.61–1.24)
GFR calc Af Amer: >60 mL/min (ref >60)
GFR calc non Af Amer: >60 mL/min (ref >60)
Glucose, Bld: 110 mg/dL — ABNORMAL HIGH (ref 70–99)
Potassium: 3.5 mmol/L (ref 3.5–5.1)
Sodium: 138 mmol/L (ref 135–145)
Total Bilirubin: 2.8 mg/dL — ABNORMAL HIGH (ref 0.3–1.2)
Total Protein: 7.1 g/dL (ref 6.5–8.1)

## 2018-07-13 LAB — CBC WITH DIFFERENTIAL/PLATELET
Abs Immature Granulocytes: 0.02 10*3/uL (ref 0.00–0.07)
Basophils Absolute: 0 10*3/uL (ref 0.0–0.1)
Basophils Relative: 1 %
Eosinophils Absolute: 0.1 10*3/uL (ref 0.0–0.5)
Eosinophils Relative: 2 %
HEMATOCRIT: 39.9 % (ref 39.0–52.0)
Hemoglobin: 13.6 g/dL (ref 13.0–17.0)
Immature Granulocytes: 0 %
Lymphocytes Relative: 29 %
Lymphs Abs: 1.5 10*3/uL (ref 0.7–4.0)
MCH: 37.5 pg — ABNORMAL HIGH (ref 26.0–34.0)
MCHC: 34.1 g/dL (ref 30.0–36.0)
MCV: 109.9 fL — ABNORMAL HIGH (ref 80.0–100.0)
MONOS PCT: 9 %
Monocytes Absolute: 0.5 10*3/uL (ref 0.1–1.0)
Neutro Abs: 3.2 10*3/uL (ref 1.7–7.7)
Neutrophils Relative %: 59 %
Platelets: 142 10*3/uL — ABNORMAL LOW (ref 150–400)
RBC: 3.63 MIL/uL — ABNORMAL LOW (ref 4.22–5.81)
RDW: 14.7 % (ref 11.5–15.5)
WBC: 5.4 10*3/uL (ref 4.0–10.5)
nRBC: 0 % (ref 0.0–0.2)

## 2018-07-13 LAB — LACTATE DEHYDROGENASE: LDH: 247 U/L — ABNORMAL HIGH (ref 98–192)

## 2018-07-13 NOTE — Progress Notes (Signed)
[place] Telephone:([phone]   [phone]  OFFICE PROGRESS NOTE  [name], MD [address] 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  PRIOR THERAPY: The patient was previously treated with a tapering dose of prednisone and high-dose IVIG for the hemolytic anemia before his diagnosis of paroxysmal nocturnal hemoglobinuria.  CURRENT THERAPY: Soliris (Eculizumab) initially with induction 600 MG IV weekly for 4 weeks and currently on maintenance treatment 900 MG IV every 2 weeks. First dose of treatment was given on 11/21/2014. Status post 45 cycles.  After cycle #45 the patient has been receiving his treatment at outside infusion center.  Status post 13 more cycles of treatment.  INTERVAL HISTORY: Travis Mcdaniel 48 y.o. male returns to the clinic today for follow-up visit.  The patient is feeling fine today with no concerning complaints except for mild fatigue.  He denied having any chest pain, shortness of breath, cough or hemoptysis.  He denied having any fever or chills.  He has no nausea, vomiting, diarrhea or constipation.  He continues to tolerate his treatment with Solaris fairly well.  He is currently on prednisone 10 mg p.o. every other day.  The patient is here today for evaluation and repeat blood work before his next dose of treatment.   MEDICAL HISTORY: Past Medical History:  Diagnosis Date  . Cancer (HCC)    PNH (Bone marrow)  . Diverticulosis   . Encounter for antineoplastic chemotherapy 01/15/2016  . Hypertension     ALLERGIES:  has No Known Allergies.  MEDICATIONS:  Current Outpatient Medications  Medication Sig Dispense Refill  . ALPRAZolam (XANAX) 1 MG tablet Take 1 mg by mouth 3 (three) times daily.  2  . aspirin EC 81 MG tablet Take 81 mg by mouth.    . Eculizumab (SOLIRIS IV) Inject 900 mg into the vein every 14 (fourteen)  days. [facility]    . lisinopril-hydrochlorothiazide (PRINZIDE,ZESTORETIC) 20-25 MG tablet Take 1 tablet by mouth daily.    . Multiple Vitamin (MULTIVITAMIN WITH MINERALS) TABS tablet Take 1 tablet by mouth daily.    . Multiple Vitamins-Minerals (MULTIVITAMIN ADULT PO) Take by mouth.    . predniSONE (DELTASONE) 10 MG tablet TAKE 6 TABS DAILY FOR 1 WK, 4 TABS DAILY FOR 1 WK, 2 TABS DAILY FOR 1 WK, 1 TAB DAILY FOR 1 WK 90 tablet 0  . Psyllium (METAMUCIL PO) Take 1 capsule by mouth daily.    [name] Kitchen tetrahydrozoline 0.05 % ophthalmic solution Place 1 drop into both eyes 2 (two) times daily as needed (eye irritation).    . traMADol (ULTRAM) 50 MG tablet Take by mouth every 6 (six) hours as needed.     No current facility-administered medications for this visit.     SURGICAL HISTORY: No past surgical history on file.  REVIEW OF SYSTEMS:  A comprehensive review of systems was negative except for: Constitutional: positive for fatigue   PHYSICAL EXAMINATION: General appearance: alert, cooperative, fatigued and no distress Head: Normocephalic, without obvious abnormality, atraumatic Neck: no adenopathy, no JVD, supple, symmetrical, trachea midline and thyroid not enlarged, symmetric, no tenderness/mass/nodules Lymph nodes: Cervical, supraclavicular, and axillary nodes normal. Resp: clear to auscultation bilaterally Back: symmetric, no curvature. ROM normal. No CVA tenderness. Cardio: regular rate and rhythm, S1, S2 normal, no murmur, click, rub or gallop GI: soft, non-tender; bowel sounds normal; no masses,  no organomegaly Extremities: extremities normal,  atraumatic, no cyanosis or edema  ECOG PERFORMANCE STATUS: 1 - Symptomatic but completely ambulatory  Blood pressure 135/78, pulse 92, temperature 98.8 F (37.1 C), temperature source Oral, resp. rate 19, height 5\' 6"  (1.676 m), weight 175 lb 6 oz (79.5 kg), SpO2 100 %.  LABORATORY DATA: Lab Results  Component Value Date     WBC 5.4 07/13/2018   HGB 13.6 07/13/2018   HCT 39.9 07/13/2018   MCV 109.9 (H) 07/13/2018   PLT 142 (L) 07/13/2018      Chemistry      Component Value Date/Time   NA 138 07/13/2018 0815   NA 136 06/28/2017 1236   K 3.5 07/13/2018 0815   K 4.1 06/28/2017 1236   CL 102 07/13/2018 0815   CO2 24 07/13/2018 0815   CO2 24 06/28/2017 1236   BUN 13 07/13/2018 0815   BUN 17.1 06/28/2017 1236   CREATININE 0.94 07/13/2018 0815   CREATININE 0.93 03/21/2018 0814   CREATININE 1.0 06/28/2017 1236      Component Value Date/Time   CALCIUM 9.5 07/13/2018 0815   CALCIUM 9.1 06/28/2017 1236   ALKPHOS 58 07/13/2018 0815   ALKPHOS 66 06/28/2017 1236   AST 100 (H) 07/13/2018 0815   AST 21 03/21/2018 0814   AST 40 (H) 06/28/2017 1236   ALT 180 (H) 07/13/2018 0815   ALT 31 03/21/2018 0814   ALT 60 (H) 06/28/2017 1236   BILITOT 2.8 (H) 07/13/2018 0815   BILITOT 2.6 (H) 03/21/2018 0814   BILITOT 2.87 (H) 06/28/2017 1236       RADIOGRAPHIC STUDIES: No results found.  ASSESSMENT AND PLAN:  [name] with paroxysmal nocturnal hemoglobinuria and currently on treatment with Soliris on days 1 and 15 every 4 weeks status post 45 cycles.  He is also on the same treatment received at outside infusion center status post 13 more cycles. He continues to tolerate this treatment well. I recommended for him to proceed with cycle #59 today. I will see him back for follow-up visit in 2 weeks for evaluation and repeat blood work. Because of the early signs of hemolysis again, I recommended for the patient to continue on prednisone 10 mg p.o. daily. I also advised the patient to reach out to [facility] stem cell transplant team for reevaluation.  He has been seen by them in the past. For hypertension he will continue with his current blood pressure medication. The patient was advised to call immediately if he has any concerning symptoms in the interval. The  patient voices understanding of current disease status and treatment options and is in agreement with the current care plan. All questions were answered. The patient knows to call the clinic with any problems, questions or concerns. We can certainly see the patient much sooner if necessary. I spent 10 minutes counseling the patient face to face. The total time spent in the appointment was 15 minutes.  Disclaimer: This note was dictated with voice recognition software. Similar sounding words can inadvertently be transcribed and may not be corrected upon review.

## 2018-07-27 ENCOUNTER — Telehealth: Payer: Self-pay

## 2018-07-27 ENCOUNTER — Inpatient Hospital Stay: Payer: BLUE CROSS/BLUE SHIELD

## 2018-07-27 ENCOUNTER — Inpatient Hospital Stay (HOSPITAL_BASED_OUTPATIENT_CLINIC_OR_DEPARTMENT_OTHER): Payer: BLUE CROSS/BLUE SHIELD | Admitting: Internal Medicine

## 2018-07-27 ENCOUNTER — Encounter: Payer: Self-pay | Admitting: Internal Medicine

## 2018-07-27 VITALS — BP 129/89 | HR 87 | Temp 98.4°F | Resp 18 | Ht 66.0 in | Wt 175.5 lb

## 2018-07-27 DIAGNOSIS — D598 Other acquired hemolytic anemias: Secondary | ICD-10-CM | POA: Diagnosis not present

## 2018-07-27 DIAGNOSIS — Z5111 Encounter for antineoplastic chemotherapy: Secondary | ICD-10-CM

## 2018-07-27 DIAGNOSIS — D595 Paroxysmal nocturnal hemoglobinuria [Marchiafava-Micheli]: Secondary | ICD-10-CM

## 2018-07-27 DIAGNOSIS — F1020 Alcohol dependence, uncomplicated: Secondary | ICD-10-CM | POA: Diagnosis not present

## 2018-07-27 DIAGNOSIS — I10 Essential (primary) hypertension: Secondary | ICD-10-CM | POA: Diagnosis not present

## 2018-07-27 LAB — CBC WITH DIFFERENTIAL/PLATELET
Abs Immature Granulocytes: 0.04 10*3/uL (ref 0.00–0.07)
BASOS PCT: 1 %
Basophils Absolute: 0 10*3/uL (ref 0.0–0.1)
Eosinophils Absolute: 0.1 10*3/uL (ref 0.0–0.5)
Eosinophils Relative: 2 %
HCT: 41.8 % (ref 39.0–52.0)
Hemoglobin: 14.2 g/dL (ref 13.0–17.0)
Immature Granulocytes: 1 %
Lymphocytes Relative: 19 %
Lymphs Abs: 1.3 10*3/uL (ref 0.7–4.0)
MCH: 38 pg — ABNORMAL HIGH (ref 26.0–34.0)
MCHC: 34 g/dL (ref 30.0–36.0)
MCV: 111.8 fL — ABNORMAL HIGH (ref 80.0–100.0)
MONOS PCT: 8 %
Monocytes Absolute: 0.5 10*3/uL (ref 0.1–1.0)
NEUTROS PCT: 69 %
Neutro Abs: 4.6 10*3/uL (ref 1.7–7.7)
Platelets: 138 10*3/uL — ABNORMAL LOW (ref 150–400)
RBC: 3.74 MIL/uL — ABNORMAL LOW (ref 4.22–5.81)
RDW: 14.5 % (ref 11.5–15.5)
WBC: 6.6 10*3/uL (ref 4.0–10.5)
nRBC: 0 % (ref 0.0–0.2)

## 2018-07-27 LAB — COMPREHENSIVE METABOLIC PANEL
ALT: 101 U/L — ABNORMAL HIGH (ref 0–44)
AST: 43 U/L — ABNORMAL HIGH (ref 15–41)
Albumin: 4.4 g/dL (ref 3.5–5.0)
Alkaline Phosphatase: 51 U/L (ref 38–126)
Anion gap: 11 (ref 5–15)
BUN: 18 mg/dL (ref 6–20)
CO2: 24 mmol/L (ref 22–32)
Calcium: 9.4 mg/dL (ref 8.9–10.3)
Chloride: 104 mmol/L (ref 98–111)
Creatinine, Ser: 0.92 mg/dL (ref 0.61–1.24)
GFR calc Af Amer: >60 mL/min (ref >60)
GFR calc non Af Amer: >60 mL/min (ref >60)
Glucose, Bld: 99 mg/dL (ref 70–99)
Potassium: 3.8 mmol/L (ref 3.5–5.1)
Sodium: 139 mmol/L (ref 135–145)
TOTAL PROTEIN: 7 g/dL (ref 6.5–8.1)
Total Bilirubin: 2.6 mg/dL — ABNORMAL HIGH (ref 0.3–1.2)

## 2018-07-27 LAB — LACTATE DEHYDROGENASE: LDH: 210 U/L — ABNORMAL HIGH (ref 98–192)

## 2018-07-27 NOTE — Progress Notes (Signed)
[place] Telephone:(336) [phone]   Fax:(336) [phone]  OFFICE PROGRESS NOTE  [name], MD [address] 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  PRIOR THERAPY: The patient was previously treated with a tapering dose of prednisone and high-dose IVIG for the hemolytic anemia before his diagnosis of paroxysmal nocturnal hemoglobinuria.  CURRENT THERAPY: Soliris (Eculizumab) initially with induction 600 MG IV weekly for 4 weeks and currently on maintenance treatment 900 MG IV every 2 weeks. First dose of treatment was given on 11/21/2014. Status post 45 cycles.  After cycle #45 the patient has been receiving his treatment at outside infusion center.  Status post 14 more cycles of treatment.  INTERVAL HISTORY: Travis Mcdaniel 48 y.o. male returns to the clinic today for follow-up visit.  The patient is feeling fine today with no concerning complaints.  He is tolerating his treatment with Solaris fairly well.  He is currently on prednisone 10 mg p.o. daily because of the recent hemolysis.  He denied having any current chest pain, shortness of breath, cough or hemoptysis.  He denied having any fever or chills.  He has no nausea, vomiting, diarrhea or constipation.  He denied having any headache or visual changes.  He is here today for evaluation and repeat blood work before starting cycle #60 of his treatment.  MEDICAL HISTORY: Past Medical History:  Diagnosis Date  . Cancer (HCC)    PNH (Bone marrow)  . Diverticulosis   . Encounter for antineoplastic chemotherapy 01/15/2016  . Hypertension     ALLERGIES:  has No Known Allergies.  MEDICATIONS:  Current Outpatient Medications  Medication Sig Dispense Refill  . ALPRAZolam (XANAX) 1 MG tablet Take 1 mg by mouth 3 (three) times daily.  2  . aspirin EC 81 MG tablet Take 81 mg by mouth.    . Eculizumab (SOLIRIS IV)  Inject 900 mg into the vein every 14 (fourteen) days. [facility]    . indomethacin (INDOCIN) 50 MG capsule     . lisinopril-hydrochlorothiazide (PRINZIDE,ZESTORETIC) 20-25 MG tablet Take 1 tablet by mouth daily.    . Multiple Vitamin (MULTIVITAMIN WITH MINERALS) TABS tablet Take 1 tablet by mouth daily.    . Multiple Vitamins-Minerals (MULTIVITAMIN ADULT PO) Take by mouth.    . predniSONE (DELTASONE) 10 MG tablet TAKE 6 TABS DAILY FOR 1 WK, 4 TABS DAILY FOR 1 WK, 2 TABS DAILY FOR 1 WK, 1 TAB DAILY FOR 1 WK 90 tablet 0  . Psyllium (METAMUCIL PO) Take 1 capsule by mouth daily.    [name] Kitchen tetrahydrozoline 0.05 % ophthalmic solution Place 1 drop into both eyes 2 (two) times daily as needed (eye irritation).    . traMADol (ULTRAM) 50 MG tablet Take by mouth every 6 (six) hours as needed.     No current facility-administered medications for this visit.     SURGICAL HISTORY: No past surgical history on file.  REVIEW OF SYSTEMS:  A comprehensive review of systems was negative.   PHYSICAL EXAMINATION: General appearance: alert, cooperative and no distress Head: Normocephalic, without obvious abnormality, atraumatic Neck: no adenopathy, no JVD, supple, symmetrical, trachea midline and thyroid not enlarged, symmetric, no tenderness/mass/nodules Lymph nodes: Cervical, supraclavicular, and axillary nodes normal. Resp: clear to auscultation bilaterally Back: symmetric, no curvature. ROM normal. No CVA tenderness. Cardio: regular rate and rhythm, S1, S2 normal, no murmur, click, rub or gallop GI: soft, non-tender; bowel  sounds normal; no masses,  no organomegaly Extremities: extremities normal, atraumatic, no cyanosis or edema  ECOG PERFORMANCE STATUS: 0 - Asymptomatic  Blood pressure 129/89, pulse 87, temperature 98.4 F (36.9 C), temperature source Oral, resp. rate 18, height 5\' 6"  (1.676 m), weight 175 lb 8 oz (79.6 kg), SpO2 100 %.  LABORATORY DATA: Lab Results  Component  Value Date   WBC 6.6 07/27/2018   HGB 14.2 07/27/2018   HCT 41.8 07/27/2018   MCV 111.8 (H) 07/27/2018   PLT 138 (L) 07/27/2018      Chemistry      Component Value Date/Time   NA 138 07/13/2018 0815   NA 136 06/28/2017 1236   K 3.5 07/13/2018 0815   K 4.1 06/28/2017 1236   CL 102 07/13/2018 0815   CO2 24 07/13/2018 0815   CO2 24 06/28/2017 1236   BUN 13 07/13/2018 0815   BUN 17.1 06/28/2017 1236   CREATININE 0.94 07/13/2018 0815   CREATININE 0.93 03/21/2018 0814   CREATININE 1.0 06/28/2017 1236      Component Value Date/Time   CALCIUM 9.5 07/13/2018 0815   CALCIUM 9.1 06/28/2017 1236   ALKPHOS 58 07/13/2018 0815   ALKPHOS 66 06/28/2017 1236   AST 100 (H) 07/13/2018 0815   AST 21 03/21/2018 0814   AST 40 (H) 06/28/2017 1236   ALT 180 (H) 07/13/2018 0815   ALT 31 03/21/2018 0814   ALT 60 (H) 06/28/2017 1236   BILITOT 2.8 (H) 07/13/2018 0815   BILITOT 2.6 (H) 03/21/2018 0814   BILITOT 2.87 (H) 06/28/2017 1236       RADIOGRAPHIC STUDIES: No results found.  ASSESSMENT AND PLAN:  [name] with paroxysmal nocturnal hemoglobinuria and currently on treatment with Soliris on days 1 and 15 every 4 weeks status post 45 cycles.  He is also on the same treatment received at outside infusion center status post 14 more cycles. He has been tolerating this treatment well. He has a recent hemolytic episode that has been going on for several weeks now.  The patient has been on a taper dose of prednisone and currently on 10 mg p.o. daily.  He is feeling fine with the current regimen including the prednisone. I recommended for him to continue his current treatment with Solaris and he will start cycle #60 today. He was also referred to [facility] stem cell transplant team for reevaluation and discussion of the transplant option.  I have a feeling that his response to Solaris is weaker at this point. We also discussed the treatment option with  Ultomiris but no significant difference in efficacy compared to Solaris except for the convenience of the dosing. The patient will continue his treatment as planned. I will see him back for follow-up visit in 2 weeks for evaluation before the next dose of his treatment. For hypertension he was advised to continue on his current blood pressure medications for now. He was advised to call immediately if he has any concerning symptoms in the interval. The patient voices understanding of current disease status and treatment options and is in agreement with the current care plan. All questions were answered. The patient knows to call the clinic with any problems, questions or concerns. We can certainly see the patient much sooner if necessary. I spent 10 minutes counseling the patient face to face. The total time spent in the appointment was 15 minutes.  Disclaimer: This note was dictated with voice recognition  software. Similar sounding words can inadvertently be transcribed and may not be corrected upon review.

## 2018-07-27 NOTE — Telephone Encounter (Signed)
Per 1/29 los tried scheduling patient for 3/11 appt.He declined due to no early morning appointment time avaliable patient said to leave it and he will get it straight with MM on the following day. Patient declined avs and los.

## 2018-08-10 ENCOUNTER — Inpatient Hospital Stay: Payer: BLUE CROSS/BLUE SHIELD | Attending: Internal Medicine | Admitting: Internal Medicine

## 2018-08-10 ENCOUNTER — Encounter: Payer: Self-pay | Admitting: Internal Medicine

## 2018-08-10 ENCOUNTER — Inpatient Hospital Stay: Payer: BLUE CROSS/BLUE SHIELD

## 2018-08-10 VITALS — BP 128/78 | HR 91 | Temp 98.0°F | Resp 18 | Ht 66.0 in | Wt 173.9 lb

## 2018-08-10 DIAGNOSIS — D595 Paroxysmal nocturnal hemoglobinuria [Marchiafava-Micheli]: Secondary | ICD-10-CM

## 2018-08-10 DIAGNOSIS — I10 Essential (primary) hypertension: Secondary | ICD-10-CM | POA: Diagnosis not present

## 2018-08-10 DIAGNOSIS — D598 Other acquired hemolytic anemias: Secondary | ICD-10-CM

## 2018-08-10 DIAGNOSIS — Z5111 Encounter for antineoplastic chemotherapy: Secondary | ICD-10-CM

## 2018-08-10 LAB — COMPREHENSIVE METABOLIC PANEL
ALT: 127 U/L — ABNORMAL HIGH (ref 0–44)
AST: 67 U/L — AB (ref 15–41)
Albumin: 4.4 g/dL (ref 3.5–5.0)
Alkaline Phosphatase: 59 U/L (ref 38–126)
Anion gap: 12 (ref 5–15)
BILIRUBIN TOTAL: 3.3 mg/dL — AB (ref 0.3–1.2)
BUN: 15 mg/dL (ref 6–20)
CALCIUM: 9.6 mg/dL (ref 8.9–10.3)
CO2: 26 mmol/L (ref 22–32)
Chloride: 103 mmol/L (ref 98–111)
Creatinine, Ser: 1.04 mg/dL (ref 0.61–1.24)
GFR calc Af Amer: >60 mL/min (ref >60)
GFR calc non Af Amer: >60 mL/min (ref >60)
Glucose, Bld: 90 mg/dL (ref 70–99)
Potassium: 3.7 mmol/L (ref 3.5–5.1)
Sodium: 141 mmol/L (ref 135–145)
Total Protein: 7.1 g/dL (ref 6.5–8.1)

## 2018-08-10 LAB — CBC WITH DIFFERENTIAL/PLATELET
Abs Immature Granulocytes: 0.03 10*3/uL (ref 0.00–0.07)
Basophils Absolute: 0 10*3/uL (ref 0.0–0.1)
Basophils Relative: 1 %
Eosinophils Absolute: 0.1 10*3/uL (ref 0.0–0.5)
Eosinophils Relative: 1 %
HCT: 40.7 % (ref 39.0–52.0)
Hemoglobin: 13.3 g/dL (ref 13.0–17.0)
Immature Granulocytes: 1 %
Lymphocytes Relative: 26 %
Lymphs Abs: 1.4 10*3/uL (ref 0.7–4.0)
MCH: 36.9 pg — ABNORMAL HIGH (ref 26.0–34.0)
MCHC: 32.7 g/dL (ref 30.0–36.0)
MCV: 113.1 fL — AB (ref 80.0–100.0)
MONOS PCT: 10 %
Monocytes Absolute: 0.6 10*3/uL (ref 0.1–1.0)
Neutro Abs: 3.3 10*3/uL (ref 1.7–7.7)
Neutrophils Relative %: 61 %
Platelets: 159 10*3/uL (ref 150–400)
RBC: 3.6 MIL/uL — ABNORMAL LOW (ref 4.22–5.81)
RDW: 14.3 % (ref 11.5–15.5)
WBC: 5.4 10*3/uL (ref 4.0–10.5)
nRBC: 0 % (ref 0.0–0.2)

## 2018-08-10 LAB — LACTATE DEHYDROGENASE: LDH: 230 U/L — ABNORMAL HIGH (ref 98–192)

## 2018-08-10 NOTE — Progress Notes (Signed)
[place] Telephone:([phone]   Fax:([phone]  OFFICE PROGRESS NOTE  [name], MD [address]. 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  PRIOR THERAPY: The patient was previously treated with a tapering dose of prednisone and high-dose IVIG for the hemolytic anemia before his diagnosis of paroxysmal nocturnal hemoglobinuria.  CURRENT THERAPY: Soliris (Eculizumab) initially with induction 600 MG IV weekly for 4 weeks and currently on maintenance treatment 900 MG IV every 2 weeks. First dose of treatment was given on 11/21/2014. Status post 45 cycles.  After cycle #45 the patient has been receiving his treatment at outside infusion center.  Status post 14 more cycles of treatment.  INTERVAL HISTORY: Travis Mcdaniel 48 y.o. male returns to the clinic today for follow-up visit.  The patient is feeling fine today with no concerning complaints.  He denied having any current chest pain, shortness of breath, cough or hemoptysis.  He denied having any fever or chills.  He has no nausea, vomiting, diarrhea or constipation.  He has no headache or visual changes.  He was seen by Dr. Arnell Sieving at [facility] for reevaluation of stem cell transplant and she recommended for the patient to continue with his current treatment and continuous taper of his prednisone.  He is here today for evaluation and repeat blood work.  MEDICAL HISTORY: Past Medical History:  Diagnosis Date  . Cancer (HCC)    PNH (Bone marrow)  . Diverticulosis   . Encounter for antineoplastic chemotherapy 01/15/2016  . Hypertension     ALLERGIES:  has No Known Allergies.  MEDICATIONS:  Current Outpatient Medications  Medication Sig Dispense Refill  . ALPRAZolam (XANAX) 1 MG tablet Take 1 mg by mouth 3 (three) times daily.  2  . aspirin EC 81 MG tablet Take 81 mg by mouth.    . Eculizumab  (SOLIRIS IV) Inject 900 mg into the vein every 14 (fourteen) days. [facility]    . indomethacin (INDOCIN) 50 MG capsule     . lisinopril-hydrochlorothiazide (PRINZIDE,ZESTORETIC) 20-25 MG tablet Take 1 tablet by mouth daily.    . Multiple Vitamin (MULTIVITAMIN WITH MINERALS) TABS tablet Take 1 tablet by mouth daily.    . Multiple Vitamins-Minerals (MULTIVITAMIN ADULT PO) Take by mouth.    . predniSONE (DELTASONE) 10 MG tablet TAKE 6 TABS DAILY FOR 1 WK, 4 TABS DAILY FOR 1 WK, 2 TABS DAILY FOR 1 WK, 1 TAB DAILY FOR 1 WK 90 tablet 0  . Psyllium (METAMUCIL PO) Take 1 capsule by mouth daily.    [name] Kitchen tetrahydrozoline 0.05 % ophthalmic solution Place 1 drop into both eyes 2 (two) times daily as needed (eye irritation).    . traMADol (ULTRAM) 50 MG tablet Take by mouth every 6 (six) hours as needed.     No current facility-administered medications for this visit.     SURGICAL HISTORY: No past surgical history on file.  REVIEW OF SYSTEMS:  A comprehensive review of systems was negative.   PHYSICAL EXAMINATION: General appearance: alert, cooperative and no distress Head: Normocephalic, without obvious abnormality, atraumatic Neck: no adenopathy, no JVD, supple, symmetrical, trachea midline and thyroid not enlarged, symmetric, no tenderness/mass/nodules Lymph nodes: Cervical, supraclavicular, and axillary nodes normal. Resp: clear to auscultation bilaterally Back: symmetric, no curvature. ROM normal. No CVA tenderness. Cardio: regular rate and rhythm, S1, S2 normal, no murmur, click, rub or gallop GI: soft,  non-tender; bowel sounds normal; no masses,  no organomegaly Extremities: extremities normal, atraumatic, no cyanosis or edema  ECOG PERFORMANCE STATUS: 0 - Asymptomatic  Blood pressure 128/78, pulse 91, temperature 98 F (36.7 C), temperature source Oral, resp. rate 18, height 5\' 6"  (1.676 m), weight 173 lb 14.4 oz (78.9 kg), SpO2 100 %.  LABORATORY DATA: Lab Results    Component Value Date   WBC 5.4 08/10/2018   HGB 13.3 08/10/2018   HCT 40.7 08/10/2018   MCV 113.1 (H) 08/10/2018   PLT 159 08/10/2018      Chemistry      Component Value Date/Time   NA 139 07/27/2018 0737   NA 136 06/28/2017 1236   K 3.8 07/27/2018 0737   K 4.1 06/28/2017 1236   CL 104 07/27/2018 0737   CO2 24 07/27/2018 0737   CO2 24 06/28/2017 1236   BUN 18 07/27/2018 0737   BUN 17.1 06/28/2017 1236   CREATININE 0.92 07/27/2018 0737   CREATININE 0.93 03/21/2018 0814   CREATININE 1.0 06/28/2017 1236      Component Value Date/Time   CALCIUM 9.4 07/27/2018 0737   CALCIUM 9.1 06/28/2017 1236   ALKPHOS 51 07/27/2018 0737   ALKPHOS 66 06/28/2017 1236   AST 43 (H) 07/27/2018 0737   AST 21 03/21/2018 0814   AST 40 (H) 06/28/2017 1236   ALT 101 (H) 07/27/2018 0737   ALT 31 03/21/2018 0814   ALT 60 (H) 06/28/2017 1236   BILITOT 2.6 (H) 07/27/2018 0737   BILITOT 2.6 (H) 03/21/2018 0814   BILITOT 2.87 (H) 06/28/2017 1236       RADIOGRAPHIC STUDIES: No results found.  ASSESSMENT AND PLAN:  [name] with paroxysmal nocturnal hemoglobinuria and currently on treatment with Soliris on days 1 and 15 every 4 weeks status post 45 cycles.  He is also on the same treatment received at outside infusion center status post 14 more cycles. He has been tolerating this treatment well. He has a recent hemolytic episode that has been going on for several weeks now.  The patient has been on a taper dose of prednisone and currently on 10 mg p.o. daily.  We will continue to monitor his blood work closely and consider further tapering of the prednisone depending on the results. The patient will proceed with day 15 of the cycle as scheduled today. I will see him back for follow-up visit in 2 weeks for evaluation before starting cycle #61. For hypertension he was advised to continue on his current blood pressure medications for now. He was advised to call  immediately if he has any concerning symptoms in the interval. The patient voices understanding of current disease status and treatment options and is in agreement with the current care plan. All questions were answered. The patient knows to call the clinic with any problems, questions or concerns. We can certainly see the patient much sooner if necessary. I spent 10 minutes counseling the patient face to face. The total time spent in the appointment was 15 minutes.  Disclaimer: This note was dictated with voice recognition software. Similar sounding words can inadvertently be transcribed and may not be corrected upon review.

## 2018-08-12 ENCOUNTER — Telehealth: Payer: Self-pay | Admitting: Medical Oncology

## 2018-08-12 NOTE — Telephone Encounter (Signed)
Per Dr Julien Nordmann , I left a VM . "He can decrease prednisone to 5 mg po qd until I see him in 2 weeks".

## 2018-08-24 ENCOUNTER — Encounter: Payer: Self-pay | Admitting: Internal Medicine

## 2018-08-24 ENCOUNTER — Inpatient Hospital Stay: Payer: BLUE CROSS/BLUE SHIELD

## 2018-08-24 ENCOUNTER — Inpatient Hospital Stay (HOSPITAL_BASED_OUTPATIENT_CLINIC_OR_DEPARTMENT_OTHER): Payer: BLUE CROSS/BLUE SHIELD | Admitting: Internal Medicine

## 2018-08-24 VITALS — BP 131/78 | HR 92 | Temp 98.3°F | Resp 18 | Ht 66.0 in | Wt 176.0 lb

## 2018-08-24 DIAGNOSIS — I10 Essential (primary) hypertension: Secondary | ICD-10-CM | POA: Diagnosis not present

## 2018-08-24 DIAGNOSIS — D696 Thrombocytopenia, unspecified: Secondary | ICD-10-CM

## 2018-08-24 DIAGNOSIS — D598 Other acquired hemolytic anemias: Secondary | ICD-10-CM

## 2018-08-24 DIAGNOSIS — D595 Paroxysmal nocturnal hemoglobinuria [Marchiafava-Micheli]: Secondary | ICD-10-CM | POA: Diagnosis not present

## 2018-08-24 DIAGNOSIS — Z5111 Encounter for antineoplastic chemotherapy: Secondary | ICD-10-CM

## 2018-08-24 LAB — COMPREHENSIVE METABOLIC PANEL
ALT: 124 U/L — ABNORMAL HIGH (ref 0–44)
AST: 67 U/L — ABNORMAL HIGH (ref 15–41)
Albumin: 4.4 g/dL (ref 3.5–5.0)
Alkaline Phosphatase: 56 U/L (ref 38–126)
Anion gap: 12 (ref 5–15)
BUN: 13 mg/dL (ref 6–20)
CO2: 24 mmol/L (ref 22–32)
Calcium: 9.4 mg/dL (ref 8.9–10.3)
Chloride: 103 mmol/L (ref 98–111)
Creatinine, Ser: 0.89 mg/dL (ref 0.61–1.24)
GFR calc Af Amer: >60 mL/min (ref >60)
Glucose, Bld: 129 mg/dL — ABNORMAL HIGH (ref 70–99)
Potassium: 3.7 mmol/L (ref 3.5–5.1)
Sodium: 139 mmol/L (ref 135–145)
Total Bilirubin: 1.9 mg/dL — ABNORMAL HIGH (ref 0.3–1.2)
Total Protein: 7.1 g/dL (ref 6.5–8.1)

## 2018-08-24 LAB — CBC WITH DIFFERENTIAL/PLATELET
Abs Immature Granulocytes: 0.02 10*3/uL (ref 0.00–0.07)
Basophils Absolute: 0 10*3/uL (ref 0.0–0.1)
Basophils Relative: 1 %
Eosinophils Absolute: 0.2 10*3/uL (ref 0.0–0.5)
Eosinophils Relative: 3 %
HCT: 40.3 % (ref 39.0–52.0)
HEMOGLOBIN: 13.8 g/dL (ref 13.0–17.0)
Immature Granulocytes: 0 %
LYMPHS PCT: 26 %
Lymphs Abs: 1.4 10*3/uL (ref 0.7–4.0)
MCH: 37.9 pg — ABNORMAL HIGH (ref 26.0–34.0)
MCHC: 34.2 g/dL (ref 30.0–36.0)
MCV: 110.7 fL — AB (ref 80.0–100.0)
MONOS PCT: 9 %
Monocytes Absolute: 0.5 10*3/uL (ref 0.1–1.0)
Neutro Abs: 3.3 10*3/uL (ref 1.7–7.7)
Neutrophils Relative %: 61 %
Platelets: 118 10*3/uL — ABNORMAL LOW (ref 150–400)
RBC: 3.64 MIL/uL — ABNORMAL LOW (ref 4.22–5.81)
RDW: 13.4 % (ref 11.5–15.5)
WBC: 5.4 10*3/uL (ref 4.0–10.5)
nRBC: 0 % (ref 0.0–0.2)

## 2018-08-24 LAB — LACTATE DEHYDROGENASE: LDH: 223 U/L — ABNORMAL HIGH (ref 98–192)

## 2018-08-24 NOTE — Progress Notes (Signed)
[place] Telephone:([phone]   Fax:(336) [phone]  OFFICE PROGRESS NOTE  [name], MD [address] 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  PRIOR THERAPY: The patient was previously treated with a tapering dose of prednisone and high-dose IVIG for the hemolytic anemia before his diagnosis of paroxysmal nocturnal hemoglobinuria.  CURRENT THERAPY: Soliris (Eculizumab) initially with induction 600 MG IV weekly for 4 weeks and currently on maintenance treatment 900 MG IV every 2 weeks. First dose of treatment was given on 11/21/2014. Status post 45 cycles.  After cycle #45 the patient has been receiving his treatment at outside infusion center.  Status post 14 more cycles of treatment.  INTERVAL HISTORY: Travis Mcdaniel 48 y.o. male returns to the clinic today for follow-up visit.  The patient is feeling fine today with no concerning complaints except for fatigue.  He denied having any bleeding issues.  He denied having any chest pain, shortness of breath, cough or hemoptysis.  He has no nausea, vomiting, diarrhea or constipation.  He has been tolerating his treatment with Solaris fairly well.  He is also on a taper dose of prednisone currently 5 mg p.o. daily.  He is here for evaluation and repeat blood work before his next dose of Solaris.   MEDICAL HISTORY: Past Medical History:  Diagnosis Date  . Cancer (HCC)    PNH (Bone marrow)  . Diverticulosis   . Encounter for antineoplastic chemotherapy 01/15/2016  . Hypertension     ALLERGIES:  has No Known Allergies.  MEDICATIONS:  Current Outpatient Medications  Medication Sig Dispense Refill  . ALPRAZolam (XANAX) 1 MG tablet Take 1 mg by mouth 3 (three) times daily.  2  . aspirin EC 81 MG tablet Take 81 mg by mouth.    . Eculizumab (SOLIRIS IV) Inject 900 mg into the vein every 14 (fourteen) days. [facility]    . indomethacin (INDOCIN) 50 MG capsule     . lisinopril-hydrochlorothiazide (PRINZIDE,ZESTORETIC) 20-25 MG tablet Take 1 tablet by mouth daily.    . Multiple Vitamin (MULTIVITAMIN WITH MINERALS) TABS tablet Take 1 tablet by mouth daily.    . Multiple Vitamins-Minerals (MULTIVITAMIN ADULT PO) Take by mouth.    . predniSONE (DELTASONE) 10 MG tablet TAKE 6 TABS DAILY FOR 1 WK, 4 TABS DAILY FOR 1 WK, 2 TABS DAILY FOR 1 WK, 1 TAB DAILY FOR 1 WK 90 tablet 0  . Psyllium (METAMUCIL PO) Take 1 capsule by mouth daily.    [name] Kitchen tetrahydrozoline 0.05 % ophthalmic solution Place 1 drop into both eyes 2 (two) times daily as needed (eye irritation).    . traMADol (ULTRAM) 50 MG tablet Take by mouth every 6 (six) hours as needed.     No current facility-administered medications for this visit.     SURGICAL HISTORY: No past surgical history on file.  REVIEW OF SYSTEMS:  A comprehensive review of systems was negative except for: Constitutional: positive for fatigue   PHYSICAL EXAMINATION: General appearance: alert, cooperative and no distress Head: Normocephalic, without obvious abnormality, atraumatic Neck: no adenopathy, no JVD, supple, symmetrical, trachea midline and thyroid not enlarged, symmetric, no tenderness/mass/nodules Lymph nodes: Cervical, supraclavicular, and axillary nodes normal. Resp: clear to auscultation bilaterally Back: symmetric, no curvature. ROM normal. No CVA tenderness. Cardio: regular rate and rhythm, S1, S2 normal, no murmur, click, rub or gallop GI: soft, non-tender; bowel sounds normal;  no masses,  no organomegaly Extremities: extremities normal, atraumatic, no cyanosis or edema  ECOG PERFORMANCE STATUS: 1 - Symptomatic but completely ambulatory  Blood pressure 131/78, pulse 92, temperature 98.3 F (36.8 C), temperature source Oral, resp. rate 18, height 5\' 6"  (1.676 m), weight 176 lb (79.8 kg), SpO2 100 %.  LABORATORY DATA: Lab Results    Component Value Date   WBC 5.4 08/24/2018   HGB 13.8 08/24/2018   HCT 40.3 08/24/2018   MCV 110.7 (H) 08/24/2018   PLT 118 (L) 08/24/2018      Chemistry      Component Value Date/Time   NA 141 08/10/2018 0743   NA 136 06/28/2017 1236   K 3.7 08/10/2018 0743   K 4.1 06/28/2017 1236   CL 103 08/10/2018 0743   CO2 26 08/10/2018 0743   CO2 24 06/28/2017 1236   BUN 15 08/10/2018 0743   BUN 17.1 06/28/2017 1236   CREATININE 1.04 08/10/2018 0743   CREATININE 0.93 03/21/2018 0814   CREATININE 1.0 06/28/2017 1236      Component Value Date/Time   CALCIUM 9.6 08/10/2018 0743   CALCIUM 9.1 06/28/2017 1236   ALKPHOS 59 08/10/2018 0743   ALKPHOS 66 06/28/2017 1236   AST 67 (H) 08/10/2018 0743   AST 21 03/21/2018 0814   AST 40 (H) 06/28/2017 1236   ALT 127 (H) 08/10/2018 0743   ALT 31 03/21/2018 0814   ALT 60 (H) 06/28/2017 1236   BILITOT 3.3 (H) 08/10/2018 0743   BILITOT 2.6 (H) 03/21/2018 0814   BILITOT 2.87 (H) 06/28/2017 1236       RADIOGRAPHIC STUDIES: No results found.  ASSESSMENT AND PLAN:  [name] with paroxysmal nocturnal hemoglobinuria and currently on treatment with Soliris on days 1 and 15 every 4 weeks status post 45 cycles.  He is also on the same treatment received at outside infusion center status post 15 more cycles. He has been tolerating this treatment well. I recommended for him to start cycle #61 today. I will see him back for follow-up visit in 2 weeks for evaluation before the next dose of his treatment. The patient is interested in switching his treatment to Ultomeris and we will discuss this in more details during his next visit and he will receive meningococcal vaccination at that time. He will continue on prednisone 5 mg p.o. daily for now. He was advised to call immediately if he has any concerning symptoms in the interval. The patient voices understanding of current disease status and treatment options and is  in agreement with the current care plan. All questions were answered. The patient knows to call the clinic with any problems, questions or concerns. We can certainly see the patient much sooner if necessary. I spent 10 minutes counseling the patient face to face. The total time spent in the appointment was 15 minutes.  Disclaimer: This note was dictated with voice recognition software. Similar sounding words can inadvertently be transcribed and may not be corrected upon review.

## 2018-09-08 ENCOUNTER — Inpatient Hospital Stay: Payer: BLUE CROSS/BLUE SHIELD | Attending: Internal Medicine | Admitting: Internal Medicine

## 2018-09-08 ENCOUNTER — Inpatient Hospital Stay: Payer: BLUE CROSS/BLUE SHIELD

## 2018-09-08 VITALS — BP 132/84 | HR 97 | Temp 99.0°F | Resp 18 | Ht <= 58 in | Wt 174.3 lb

## 2018-09-08 DIAGNOSIS — D595 Paroxysmal nocturnal hemoglobinuria [Marchiafava-Micheli]: Secondary | ICD-10-CM | POA: Diagnosis not present

## 2018-09-08 DIAGNOSIS — Z23 Encounter for immunization: Secondary | ICD-10-CM | POA: Insufficient documentation

## 2018-09-08 DIAGNOSIS — Z5111 Encounter for antineoplastic chemotherapy: Secondary | ICD-10-CM

## 2018-09-08 DIAGNOSIS — I10 Essential (primary) hypertension: Secondary | ICD-10-CM

## 2018-09-08 DIAGNOSIS — F1020 Alcohol dependence, uncomplicated: Secondary | ICD-10-CM

## 2018-09-08 LAB — CBC WITH DIFFERENTIAL/PLATELET
Abs Immature Granulocytes: 0.01 10*3/uL (ref 0.00–0.07)
Basophils Absolute: 0 10*3/uL (ref 0.0–0.1)
Basophils Relative: 1 %
Eosinophils Absolute: 0.2 10*3/uL (ref 0.0–0.5)
Eosinophils Relative: 3 %
HCT: 40.9 % (ref 39.0–52.0)
Hemoglobin: 13.8 g/dL (ref 13.0–17.0)
Immature Granulocytes: 0 %
Lymphocytes Relative: 16 %
Lymphs Abs: 0.8 10*3/uL (ref 0.7–4.0)
MCH: 37.8 pg — ABNORMAL HIGH (ref 26.0–34.0)
MCHC: 33.7 g/dL (ref 30.0–36.0)
MCV: 112.1 fL — AB (ref 80.0–100.0)
Monocytes Absolute: 0.5 10*3/uL (ref 0.1–1.0)
Monocytes Relative: 10 %
Neutro Abs: 3.7 10*3/uL (ref 1.7–7.7)
Neutrophils Relative %: 70 %
PLATELETS: 135 10*3/uL — AB (ref 150–400)
RBC: 3.65 MIL/uL — ABNORMAL LOW (ref 4.22–5.81)
RDW: 13.8 % (ref 11.5–15.5)
WBC: 5.2 10*3/uL (ref 4.0–10.5)
nRBC: 0 % (ref 0.0–0.2)

## 2018-09-08 LAB — COMPREHENSIVE METABOLIC PANEL
ALT: 147 U/L — ABNORMAL HIGH (ref 0–44)
ANION GAP: 13 (ref 5–15)
AST: 78 U/L — ABNORMAL HIGH (ref 15–41)
Albumin: 4.4 g/dL (ref 3.5–5.0)
Alkaline Phosphatase: 56 U/L (ref 38–126)
BILIRUBIN TOTAL: 2.6 mg/dL — AB (ref 0.3–1.2)
BUN: 14 mg/dL (ref 6–20)
CO2: 23 mmol/L (ref 22–32)
Calcium: 9.5 mg/dL (ref 8.9–10.3)
Chloride: 103 mmol/L (ref 98–111)
Creatinine, Ser: 0.85 mg/dL (ref 0.61–1.24)
GFR calc Af Amer: >60 mL/min (ref >60)
GFR calc non Af Amer: >60 mL/min (ref >60)
GLUCOSE: 91 mg/dL (ref 70–99)
Potassium: 3.9 mmol/L (ref 3.5–5.1)
Sodium: 139 mmol/L (ref 135–145)
TOTAL PROTEIN: 7.1 g/dL (ref 6.5–8.1)

## 2018-09-08 LAB — LACTATE DEHYDROGENASE: LDH: 266 U/L — ABNORMAL HIGH (ref 98–192)

## 2018-09-09 ENCOUNTER — Encounter: Payer: Self-pay | Admitting: Internal Medicine

## 2018-09-09 ENCOUNTER — Telehealth: Payer: Self-pay | Admitting: Internal Medicine

## 2018-09-09 NOTE — Telephone Encounter (Signed)
Scheduled appt per 3/12 los - left message for patient with appt date and time

## 2018-09-09 NOTE — Progress Notes (Signed)
[place] Telephone:([phone]   Fax:(336) [phone]  OFFICE PROGRESS NOTE  [name], MD [address] 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  PRIOR THERAPY:  Soliris (Eculizumab) initially with induction 600 MG IV weekly for 4 weeks and currently on maintenance treatment 900 MG IV every 2 weeks. First dose of treatment was given on 11/21/2014. Status post 45 cycles.  After cycle #45 the patient has been receiving his treatment at outside infusion center.  Status post 16 more cycles of treatment.  CURRENT THERAPY: Ultomiris (Ravulizumab) loading dose 2700 mg/M2 followed by maintenance dose 3300 mg/M2 every 8 weeks.  First dose of treatment September 22, 2018.  INTERVAL HISTORY: Travis Mcdaniel 48 y.o. male returns to the clinic today for follow-up visit.  The patient is feeling fine today with no concerning complaints except for mild fatigue.  He is currently on a taper dose of prednisone 5 mg p.o. daily and has been tolerating it fairly well.  He denied having any chest pain, shortness of breath, cough or hemoptysis.  He denied having any fever or chills.  He has no nausea, vomiting, diarrhea or constipation.  He has no headache or visual changes.  He has no weight loss or night sweats.  The patient is very interested in switching his treatment to Ultomiris starting in 2 weeks.   MEDICAL HISTORY: Past Medical History:  Diagnosis Date  . Cancer (HCC)    PNH (Bone marrow)  . Diverticulosis   . Encounter for antineoplastic chemotherapy 01/15/2016  . Hypertension     ALLERGIES:  has No Known Allergies.  MEDICATIONS:  Current Outpatient Medications  Medication Sig Dispense Refill  . ALPRAZolam (XANAX) 1 MG tablet Take 1 mg by mouth 3 (three) times daily.  2  . aspirin EC 81 MG tablet Take 81 mg by mouth.    . Eculizumab (SOLIRIS IV) Inject 900 mg into the  vein every 14 (fourteen) days. [facility]    . indomethacin (INDOCIN) 50 MG capsule     . lisinopril-hydrochlorothiazide (PRINZIDE,ZESTORETIC) 20-25 MG tablet Take 1 tablet by mouth daily.    . Multiple Vitamin (MULTIVITAMIN WITH MINERALS) TABS tablet Take 1 tablet by mouth daily.    . Multiple Vitamins-Minerals (MULTIVITAMIN ADULT PO) Take by mouth.    . predniSONE (DELTASONE) 10 MG tablet TAKE 6 TABS DAILY FOR 1 WK, 4 TABS DAILY FOR 1 WK, 2 TABS DAILY FOR 1 WK, 1 TAB DAILY FOR 1 WK 90 tablet 0  . Psyllium (METAMUCIL PO) Take 1 capsule by mouth daily.    [name] Kitchen tetrahydrozoline 0.05 % ophthalmic solution Place 1 drop into both eyes 2 (two) times daily as needed (eye irritation).    . traMADol (ULTRAM) 50 MG tablet Take by mouth every 6 (six) hours as needed.     No current facility-administered medications for this visit.     SURGICAL HISTORY: No past surgical history on file.  REVIEW OF SYSTEMS:  Constitutional: negative Eyes: negative Ears, nose, mouth, throat, and face: negative Respiratory: negative Cardiovascular: negative Gastrointestinal: negative Genitourinary:negative Integument/breast: negative Hematologic/lymphatic: negative Musculoskeletal:negative Neurological: negative Behavioral/Psych: negative Endocrine: negative Allergic/Immunologic: negative   PHYSICAL EXAMINATION: General appearance: alert, cooperative and no distress Head: Normocephalic, without obvious abnormality, atraumatic Neck: no adenopathy, no JVD, supple, symmetrical, trachea midline and thyroid not enlarged, symmetric, no tenderness/mass/nodules Lymph nodes: Cervical, supraclavicular, and axillary nodes normal. Resp: clear to  auscultation bilaterally Back: symmetric, no curvature. ROM normal. No CVA tenderness. Cardio: regular rate and rhythm, S1, S2 normal, no murmur, click, rub or gallop GI: soft, non-tender; bowel sounds normal; no masses,  no organomegaly Extremities:  extremities normal, atraumatic, no cyanosis or edema Neurologic: Alert and oriented X 3, normal strength and tone. Normal symmetric reflexes. Normal coordination and gait  ECOG PERFORMANCE STATUS: 1 - Symptomatic but completely ambulatory  Blood pressure 132/84, pulse 97, temperature 99 F (37.2 C), resp. rate 18, height (!) 6.6" (0.168 m), weight 174 lb 4.8 oz (79.1 kg), SpO2 100 %.  LABORATORY DATA: Lab Results  Component Value Date   WBC 5.2 09/08/2018   HGB 13.8 09/08/2018   HCT 40.9 09/08/2018   MCV 112.1 (H) 09/08/2018   PLT 135 (L) 09/08/2018      Chemistry      Component Value Date/Time   NA 139 09/08/2018 0905   NA 136 06/28/2017 1236   K 3.9 09/08/2018 0905   K 4.1 06/28/2017 1236   CL 103 09/08/2018 0905   CO2 23 09/08/2018 0905   CO2 24 06/28/2017 1236   BUN 14 09/08/2018 0905   BUN 17.1 06/28/2017 1236   CREATININE 0.85 09/08/2018 0905   CREATININE 0.93 03/21/2018 0814   CREATININE 1.0 06/28/2017 1236      Component Value Date/Time   CALCIUM 9.5 09/08/2018 0905   CALCIUM 9.1 06/28/2017 1236   ALKPHOS 56 09/08/2018 0905   ALKPHOS 66 06/28/2017 1236   AST 78 (H) 09/08/2018 0905   AST 21 03/21/2018 0814   AST 40 (H) 06/28/2017 1236   ALT 147 (H) 09/08/2018 0905   ALT 31 03/21/2018 0814   ALT 60 (H) 06/28/2017 1236   BILITOT 2.6 (H) 09/08/2018 0905   BILITOT 2.6 (H) 03/21/2018 0814   BILITOT 2.87 (H) 06/28/2017 1236       RADIOGRAPHIC STUDIES: No results found.  ASSESSMENT AND PLAN:  [name] with paroxysmal nocturnal hemoglobinuria and currently on treatment with Soliris on days 1 and 15 every 4 weeks status post 45 cycles.  He is also on the same treatment received at outside infusion center status post 16 more cycles. He has been tolerating this treatment well except for the few episodes of hemolysis responded well to prednisone.  The patient is currently on a taper dose of prednisone 5 mg p.o. daily.  I  recommended for him to change his dose to 5 mg every other day for the next 2 weeks. He mentioned his interest in switching his treatment to Ultomiris because of the convenience schedule and he would be able to return back to work.  I discussed with the patient the new treatment as well as the adverse effects and the risk of serious meningococcal infection.  The patient had received meningococcal vaccine 4 years ago but we will give him another dose in the next few days before starting the next treatment. He is expected to start the first dose of Ultomiris on September 22, 2018 with a loading dose of 2700 mg/M2 followed in 2 weeks by the first maintenance dose 3300 mg/M2 every 8 weeks. The patient will come back for follow-up visit in 2 weeks for evaluation before starting the first dose of his treatment with Ultomiris. He was advised to call immediately if he has any concerning symptoms in the interval. The patient voices understanding of current disease status and treatment options and is in agreement with the  current care plan. All questions were answered. The patient knows to call the clinic with any problems, questions or concerns. We can certainly see the patient much sooner if necessary.  Disclaimer: This note was dictated with voice recognition software. Similar sounding words can inadvertently be transcribed and may not be corrected upon review.

## 2018-09-09 NOTE — Progress Notes (Signed)
START OFF PATHWAY REGIMEN - Other Dx   [id number]:Eculizumab Loading Dose (PNH):   Loading dose during first 5 weeks of treatment:     Eculizumab      Eculizumab   **Always confirm dose/schedule in your pharmacy ordering system**  [id number]:Eculizumab Maintenance every 14 days (PNH; **2 cycles per order sheet**):   A cycle is every 14 days:     Eculizumab   **Always confirm dose/schedule in your pharmacy ordering system**  Patient Characteristics: Intent of Therapy: Non-Curative / Palliative Intent, Discussed with Patient

## 2018-09-13 ENCOUNTER — Other Ambulatory Visit: Payer: Self-pay

## 2018-09-13 ENCOUNTER — Inpatient Hospital Stay: Payer: BLUE CROSS/BLUE SHIELD

## 2018-09-13 DIAGNOSIS — D595 Paroxysmal nocturnal hemoglobinuria [Marchiafava-Micheli]: Secondary | ICD-10-CM

## 2018-09-13 MED ORDER — MENINGOCOCCAL A C Y&W-135 OLIG IM SOLR
0.5000 mL | Freq: Once | INTRAMUSCULAR | Status: AC
  Administered 2018-09-13: 0.5 mL via INTRAMUSCULAR
  Filled 2018-09-13: qty 0.5

## 2018-09-13 NOTE — Patient Instructions (Signed)
Meningococcal Diphtheria Toxoid Conjugate Vaccine  What is this medicine?  MENINGOCOCCAL DIPHTHERIA TOXOID CONJUGATE VACCINE (muh ning goh KOK kal dif THEER ee uh TOK soid KON juh geyt vak SEEN) is a vaccine to protect from bacterial meningitis. This vaccine does not contain live bacteria. It will not cause a meningitis.  This medicine may be used for other purposes; ask your health care provider or pharmacist if you have questions.  COMMON BRAND NAME(S): Menactra, Menveo  What should I tell my health care provider before I take this medicine?  They need to know if you have any of these conditions:  -bleeding disorder  -fever or infection  -history of Guillain-Barre syndrome  -immune system problems  -an unusual or allergic reaction to diphtheria toxoid, meningococcal vaccine, latex, other medicines, foods, dyes, or preservatives  -pregnant or trying to get pregnant  -breast-feeding  How should I use this medicine?  This medicine is for injection into a muscle. It is given by a health care professional in a hospital or clinic setting.  A copy of Vaccine Information Statements will be given before each vaccination. Read this sheet carefully each time. The sheet may change frequently.  Talk to your pediatrician regarding the use of this medicine in children. While some brands of this drug may be prescribed for children as young as 9 months of age for selected conditions, precautions do apply.  Overdosage: If you think you have taken too much of this medicine contact a poison control center or emergency room at once.  NOTE: This medicine is only for you. Do not share this medicine with others.  What if I miss a dose?  This does not apply.  What may interact with this medicine?  -adalimumab  -anakinra  -infliximab  -medicines for organ transplant  -medicines to treat cancer  -medicines used during some procedures to diagnose a medical condition  -other vaccines  -some medicines for arthritis  -steroid medicines like  prednisone or cortisone  This list may not describe all possible interactions. Give your health care provider a list of all the medicines, herbs, non-prescription drugs, or dietary supplements you use. Also tell them if you smoke, drink alcohol, or use illegal drugs. Some items may interact with your medicine.  What should I watch for while using this medicine?  Report any side effects that are worrisome to your doctor right away. Call your doctor if you have any unusual symptoms within 6 weeks of getting this vaccine.  This vaccine may not protect from all meningitis infections.  Women should inform their doctor if they wish to become pregnant or think they might be pregnant. Talk to your health care professional or pharmacist for more information.  What side effects may I notice from receiving this medicine?  Side effects that you should report to your doctor or health care professional as soon as possible:  -allergic reactions like skin rash, itching or hives, swelling of the face, lips, or tongue  -breathing problems  -feeling faint or lightheaded, falls  -fever over 102 degrees F  -muscle weakness  -unusual drooping or paralysis of face  Side effects that usually do not require medical attention (report to your doctor or health care professional if they continue or are bothersome):  -chills  -diarrhea  -headache  -loss of appetite  -muscle aches and pains  -pain at site where injected  -tired  This list may not describe all possible side effects. Call your doctor for medical advice about side effects.   You may report side effects to FDA at 1-800-FDA-1088.  Where should I keep my medicine?  This drug is given in a hospital or clinic and will not be stored at home.  NOTE: This sheet is a summary. It may not cover all possible information. If you have questions about this medicine, talk to your doctor, pharmacist, or health care provider.   2019 Elsevier/Gold Standard (2009-11-05 21:41:10)

## 2018-09-15 ENCOUNTER — Encounter: Payer: Self-pay | Admitting: Medical Oncology

## 2018-09-19 ENCOUNTER — Telehealth: Payer: Self-pay | Admitting: Medical Oncology

## 2018-09-19 NOTE — Telephone Encounter (Signed)
Letter placed up front for pick up

## 2018-09-21 ENCOUNTER — Other Ambulatory Visit: Payer: Self-pay

## 2018-09-21 ENCOUNTER — Encounter: Payer: Self-pay | Admitting: Internal Medicine

## 2018-09-21 ENCOUNTER — Inpatient Hospital Stay: Payer: BLUE CROSS/BLUE SHIELD

## 2018-09-21 ENCOUNTER — Inpatient Hospital Stay (HOSPITAL_BASED_OUTPATIENT_CLINIC_OR_DEPARTMENT_OTHER): Payer: BLUE CROSS/BLUE SHIELD | Admitting: Internal Medicine

## 2018-09-21 VITALS — BP 121/84 | HR 100 | Temp 98.4°F | Resp 18 | Wt 177.0 lb

## 2018-09-21 DIAGNOSIS — D595 Paroxysmal nocturnal hemoglobinuria [Marchiafava-Micheli]: Secondary | ICD-10-CM | POA: Diagnosis not present

## 2018-09-21 DIAGNOSIS — Z5111 Encounter for antineoplastic chemotherapy: Secondary | ICD-10-CM

## 2018-09-21 DIAGNOSIS — I10 Essential (primary) hypertension: Secondary | ICD-10-CM

## 2018-09-21 LAB — COMPREHENSIVE METABOLIC PANEL
ALT: 165 U/L — ABNORMAL HIGH (ref 0–44)
AST: 100 U/L — ABNORMAL HIGH (ref 15–41)
Albumin: 4.2 g/dL (ref 3.5–5.0)
Alkaline Phosphatase: 62 U/L (ref 38–126)
Anion gap: 12 (ref 5–15)
BUN: 11 mg/dL (ref 6–20)
CO2: 24 mmol/L (ref 22–32)
Calcium: 9.2 mg/dL (ref 8.9–10.3)
Chloride: 102 mmol/L (ref 98–111)
Creatinine, Ser: 0.87 mg/dL (ref 0.61–1.24)
GFR calc Af Amer: >60 mL/min (ref >60)
Glucose, Bld: 146 mg/dL — ABNORMAL HIGH (ref 70–99)
Potassium: 3.7 mmol/L (ref 3.5–5.1)
Sodium: 138 mmol/L (ref 135–145)
Total Bilirubin: 3.1 mg/dL — ABNORMAL HIGH (ref 0.3–1.2)
Total Protein: 6.8 g/dL (ref 6.5–8.1)

## 2018-09-21 LAB — CBC WITH DIFFERENTIAL/PLATELET
Abs Immature Granulocytes: 0.02 10*3/uL (ref 0.00–0.07)
Basophils Absolute: 0 10*3/uL (ref 0.0–0.1)
Basophils Relative: 1 %
Eosinophils Absolute: 0.2 10*3/uL (ref 0.0–0.5)
Eosinophils Relative: 5 %
HCT: 38.3 % — ABNORMAL LOW (ref 39.0–52.0)
Hemoglobin: 12.9 g/dL — ABNORMAL LOW (ref 13.0–17.0)
IMMATURE GRANULOCYTES: 0 %
Lymphocytes Relative: 23 %
Lymphs Abs: 1.1 10*3/uL (ref 0.7–4.0)
MCH: 38.5 pg — ABNORMAL HIGH (ref 26.0–34.0)
MCHC: 33.7 g/dL (ref 30.0–36.0)
MCV: 114.3 fL — ABNORMAL HIGH (ref 80.0–100.0)
Monocytes Absolute: 0.4 10*3/uL (ref 0.1–1.0)
Monocytes Relative: 9 %
Neutro Abs: 2.9 10*3/uL (ref 1.7–7.7)
Neutrophils Relative %: 62 %
Platelets: 142 10*3/uL — ABNORMAL LOW (ref 150–400)
RBC: 3.35 MIL/uL — AB (ref 4.22–5.81)
RDW: 15 % (ref 11.5–15.5)
WBC: 4.6 10*3/uL (ref 4.0–10.5)
nRBC: 0.4 % — ABNORMAL HIGH (ref 0.0–0.2)

## 2018-09-21 LAB — LACTATE DEHYDROGENASE: LDH: 274 U/L — ABNORMAL HIGH (ref 98–192)

## 2018-09-21 NOTE — Progress Notes (Signed)
Travis [phone]   [phone]  OFFICE PROGRESS NOTE  Travis Reach, MD [address]. 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  PRIOR THERAPY:  Soliris (Eculizumab) initially with induction 600 MG IV weekly for 4 weeks and currently on maintenance treatment 900 MG IV every 2 weeks. First dose of treatment was given on 11/21/2014. Status post 45 cycles.  After cycle #45 the patient has been receiving his treatment at outside infusion center.  Status post 16 more cycles of treatment.  CURRENT THERAPY: Ultomiris (Ravulizumab) loading dose 2700 mg/M2 followed by maintenance dose 3300 mg/M2 every 8 weeks.  First dose of treatment September 22, 2018.  INTERVAL HISTORY: Travis Mcdaniel 48 y.o. male returns to the clinic today for follow-up visit.  The patient is feeling fine today with no concerning complaints.  He completed the taper dose of prednisone.  He denied having any current chest pain, shortness of breath, cough or hemoptysis.  He denied having any fever or chills.  He has no nausea, vomiting, diarrhea or constipation.  He denied having any headache or visual changes.  He tolerated the last dose of Solaris fairly well.  He was supposed to start the first dose of Ultomiris today.   MEDICAL HISTORY: Past Medical History:  Diagnosis Date  . Cancer (HCC)    PNH (Bone marrow)  . Diverticulosis   . Encounter for antineoplastic chemotherapy 01/15/2016  . Hypertension     ALLERGIES:  has No Known Allergies.  MEDICATIONS:  Current Outpatient Medications  Medication Sig Dispense Refill  . ALPRAZolam (XANAX) 1 MG tablet Take 1 mg by mouth 3 (three) times daily.  2  . aspirin EC 81 MG tablet Take 81 mg by mouth.    . Eculizumab (SOLIRIS IV) Inject 900 mg into the vein every 14 (fourteen) days. [facility]    . indomethacin  (INDOCIN) 50 MG capsule     . lisinopril-hydrochlorothiazide (PRINZIDE,ZESTORETIC) 20-25 MG tablet Take 1 tablet by mouth daily.    . Multiple Vitamin (MULTIVITAMIN WITH MINERALS) TABS tablet Take 1 tablet by mouth daily.    . Multiple Vitamins-Minerals (MULTIVITAMIN ADULT PO) Take by mouth.    . predniSONE (DELTASONE) 10 MG tablet TAKE 6 TABS DAILY FOR 1 WK, 4 TABS DAILY FOR 1 WK, 2 TABS DAILY FOR 1 WK, 1 TAB DAILY FOR 1 WK 90 tablet 0  . Psyllium (METAMUCIL PO) Take 1 capsule by mouth daily.    [name] Kitchen tetrahydrozoline 0.05 % ophthalmic solution Place 1 drop into both eyes 2 (two) times daily as needed (eye irritation).    . traMADol (ULTRAM) 50 MG tablet Take by mouth every 6 (six) hours as needed.     No current facility-administered medications for this visit.     SURGICAL HISTORY: No past surgical history on file.  REVIEW OF SYSTEMS:  A comprehensive review of systems was negative.   PHYSICAL EXAMINATION: General appearance: alert, cooperative and no distress Head: Normocephalic, without obvious abnormality, atraumatic Neck: no adenopathy, no JVD, supple, symmetrical, trachea midline and thyroid not enlarged, symmetric, no tenderness/mass/nodules Lymph nodes: Cervical, supraclavicular, and axillary nodes normal. Resp: clear to auscultation bilaterally Back: symmetric, no curvature. ROM normal. No CVA tenderness. Cardio: regular rate and rhythm, S1, S2 normal, no murmur, click, rub or gallop GI: soft, non-tender; bowel sounds normal; no masses,  no organomegaly Extremities: extremities normal, atraumatic, no cyanosis  or edema  ECOG PERFORMANCE STATUS: 1 - Symptomatic but completely ambulatory  Blood pressure 121/84, pulse 100, temperature 98.4 F (36.9 C), temperature source Oral, resp. rate 18, weight 177 lb (80.3 kg), SpO2 99 %.  LABORATORY DATA: Lab Results  Component Value Date   WBC 4.6 09/21/2018   HGB 12.9 (L) 09/21/2018   HCT 38.3 (L) 09/21/2018   MCV 114.3 (H) 09/21/2018    PLT 142 (L) 09/21/2018      Chemistry      Component Value Date/Time   NA 138 09/21/2018 0741   NA 136 06/28/2017 1236   K 3.7 09/21/2018 0741   K 4.1 06/28/2017 1236   CL 102 09/21/2018 0741   CO2 24 09/21/2018 0741   CO2 24 06/28/2017 1236   BUN 11 09/21/2018 0741   BUN 17.1 06/28/2017 1236   CREATININE 0.87 09/21/2018 0741   CREATININE 0.93 03/21/2018 0814   CREATININE 1.0 06/28/2017 1236      Component Value Date/Time   CALCIUM 9.2 09/21/2018 0741   CALCIUM 9.1 06/28/2017 1236   ALKPHOS 62 09/21/2018 0741   ALKPHOS 66 06/28/2017 1236   AST 100 (H) 09/21/2018 0741   AST 21 03/21/2018 0814   AST 40 (H) 06/28/2017 1236   ALT 165 (H) 09/21/2018 0741   ALT 31 03/21/2018 0814   ALT 60 (H) 06/28/2017 1236   BILITOT 3.1 (H) 09/21/2018 0741   BILITOT 2.6 (H) 03/21/2018 0814   BILITOT 2.87 (H) 06/28/2017 1236       RADIOGRAPHIC STUDIES: No results found.  ASSESSMENT AND PLAN:  [name] with paroxysmal nocturnal hemoglobinuria and currently on treatment with Soliris on days 1 and 15 every 4 weeks status post 45 cycles.  He is also on the same treatment received at outside infusion center status post 16 more cycles. He also completed a course of tapered dose of prednisone. The patient is feeling fine today.  He was supposed to start the first cycle of Ultomiris today.  I am not sure if this was approved by his insurance but if not he will continue with 1 more dose of Solaris until we receive the approval. He will come back for follow-up visit in 2 weeks for evaluation. The patient was advised to call immediately if he has any concerning symptoms in the interval. The patient voices understanding of current disease status and treatment options and is in agreement with the current care plan. All questions were answered. The patient knows to call the clinic with any problems, questions or concerns. We can certainly see the patient much  sooner if necessary.  Disclaimer: This note was dictated with voice recognition software. Similar sounding words can inadvertently be transcribed and may not be corrected upon review.

## 2018-09-30 ENCOUNTER — Telehealth: Payer: Self-pay | Admitting: Medical Oncology

## 2018-09-30 NOTE — Telephone Encounter (Signed)
Pt not getting Ultomiris at cancer center. Per [place] ,his insurance will not cover the cost of hospital fee and it is in the pts best interest to continue getting his tx -new drug -at the same infusion center.  Spoke to pharmacist at [facility] . He will fax order sheet to [facility] so they can administer ultomiris there.

## 2018-10-03 ENCOUNTER — Telehealth: Payer: Self-pay | Admitting: *Deleted

## 2018-10-03 ENCOUNTER — Telehealth: Payer: Self-pay | Admitting: Medical Oncology

## 2018-10-03 NOTE — Telephone Encounter (Signed)
If this is required.  We can ask pharmacy to have it available for him by the next visit.

## 2018-10-03 NOTE — Telephone Encounter (Signed)
keep appt here on wed

## 2018-10-03 NOTE — Telephone Encounter (Signed)
Case manager reports the FDA ACIP guidelines recommend Meningitis B- ( Bexsero) for pt on Solaris and Ultimirs. Per our pharmacy there isi no record that pt received this vaccine for b serotype.

## 2018-10-03 NOTE — Telephone Encounter (Signed)
TCT patient regarding upcoming appts. [place]. He is scheduled to come in for labs on Wednesday and to see [place] with Dr. Julien Nordmann.  He then goes to an infusion center in [place] for his infusion as his insurance does not cover it here at the cancer center. He states he will be here for his labs and provider visit.

## 2018-10-04 ENCOUNTER — Other Ambulatory Visit: Payer: Self-pay | Admitting: *Deleted

## 2018-10-04 DIAGNOSIS — D595 Paroxysmal nocturnal hemoglobinuria [Marchiafava-Micheli]: Secondary | ICD-10-CM

## 2018-10-04 NOTE — Telephone Encounter (Signed)
Scheduled and message sent to pharmacy

## 2018-10-05 ENCOUNTER — Inpatient Hospital Stay: Payer: BLUE CROSS/BLUE SHIELD

## 2018-10-05 ENCOUNTER — Inpatient Hospital Stay: Payer: BLUE CROSS/BLUE SHIELD | Attending: Internal Medicine | Admitting: Physician Assistant

## 2018-10-05 ENCOUNTER — Other Ambulatory Visit: Payer: Self-pay

## 2018-10-05 ENCOUNTER — Encounter: Payer: Self-pay | Admitting: Physician Assistant

## 2018-10-05 ENCOUNTER — Ambulatory Visit: Payer: BLUE CROSS/BLUE SHIELD

## 2018-10-05 VITALS — BP 149/84 | HR 96 | Temp 98.5°F | Resp 20 | Wt 174.9 lb

## 2018-10-05 DIAGNOSIS — Z23 Encounter for immunization: Secondary | ICD-10-CM | POA: Insufficient documentation

## 2018-10-05 DIAGNOSIS — D595 Paroxysmal nocturnal hemoglobinuria [Marchiafava-Micheli]: Secondary | ICD-10-CM

## 2018-10-05 DIAGNOSIS — I10 Essential (primary) hypertension: Secondary | ICD-10-CM | POA: Insufficient documentation

## 2018-10-05 DIAGNOSIS — Z5111 Encounter for antineoplastic chemotherapy: Secondary | ICD-10-CM

## 2018-10-05 LAB — COMPREHENSIVE METABOLIC PANEL
ALT: 154 U/L — ABNORMAL HIGH (ref 0–44)
AST: 94 U/L — ABNORMAL HIGH (ref 15–41)
Albumin: 4.3 g/dL (ref 3.5–5.0)
Alkaline Phosphatase: 69 U/L (ref 38–126)
Anion gap: 12 (ref 5–15)
BUN: 12 mg/dL (ref 6–20)
CO2: 23 mmol/L (ref 22–32)
Calcium: 9.4 mg/dL (ref 8.9–10.3)
Chloride: 104 mmol/L (ref 98–111)
Creatinine, Ser: 0.98 mg/dL (ref 0.61–1.24)
GFR calc Af Amer: >60 mL/min (ref >60)
GFR calc non Af Amer: >60 mL/min (ref >60)
Glucose, Bld: 131 mg/dL — ABNORMAL HIGH (ref 70–99)
Potassium: 3.8 mmol/L (ref 3.5–5.1)
Sodium: 139 mmol/L (ref 135–145)
Total Bilirubin: 2.3 mg/dL — ABNORMAL HIGH (ref 0.3–1.2)
Total Protein: 7.4 g/dL (ref 6.5–8.1)

## 2018-10-05 LAB — CBC WITH DIFFERENTIAL/PLATELET
Abs Immature Granulocytes: 0.01 10*3/uL (ref 0.00–0.07)
Basophils Absolute: 0 10*3/uL (ref 0.0–0.1)
Basophils Relative: 1 %
Eosinophils Absolute: 0.2 10*3/uL (ref 0.0–0.5)
Eosinophils Relative: 5 %
HCT: 38.3 % — ABNORMAL LOW (ref 39.0–52.0)
Hemoglobin: 12.8 g/dL — ABNORMAL LOW (ref 13.0–17.0)
Immature Granulocytes: 0 %
Lymphocytes Relative: 27 %
Lymphs Abs: 1.1 10*3/uL (ref 0.7–4.0)
MCH: 38.8 pg — ABNORMAL HIGH (ref 26.0–34.0)
MCHC: 33.4 g/dL (ref 30.0–36.0)
MCV: 116.1 fL — ABNORMAL HIGH (ref 80.0–100.0)
Monocytes Absolute: 0.4 10*3/uL (ref 0.1–1.0)
Monocytes Relative: 10 %
Neutro Abs: 2.3 10*3/uL (ref 1.7–7.7)
Neutrophils Relative %: 57 %
Platelets: 147 10*3/uL — ABNORMAL LOW (ref 150–400)
RBC: 3.3 MIL/uL — ABNORMAL LOW (ref 4.22–5.81)
RDW: 15.7 % — ABNORMAL HIGH (ref 11.5–15.5)
WBC: 4 10*3/uL (ref 4.0–10.5)
nRBC: 0.5 % — ABNORMAL HIGH (ref 0.0–0.2)

## 2018-10-05 LAB — LACTATE DEHYDROGENASE: LDH: 282 U/L — ABNORMAL HIGH (ref 98–192)

## 2018-10-05 MED ORDER — MENINGOCOCCAL VAC B (OMV) IM SUSY
0.5000 mL | PREFILLED_SYRINGE | Freq: Once | INTRAMUSCULAR | Status: AC
  Administered 2018-10-05: 09:00:00 0.5 mL via INTRAMUSCULAR
  Filled 2018-10-05: qty 0.5

## 2018-10-05 NOTE — Progress Notes (Signed)
[facility] OFFICE PROGRESS NOTE  [name], MD [address] 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  PRIOR THERAPY:  1) The patient was previously treated with a tapering dose of prednisone and high-dose IVIG for the hemolytic anemia before his diagnosis of paroxysmal nocturnal hemoglobinuria. 2) Soliris (Eculizumab) initially with induction 600 MG IV weekly for 4 weeks and currently on maintenance treatment 900 MG IV every 2 weeks. First dose of treatment was given on 11/21/2014. Status post 45 cycles.  After cycle #45 the patient has been receiving his treatment at outside infusion center.  Status post 17 more cycles of treatment. His last treatment is expected April 8th, 2020.   CURRENT THERAPY: Ultomiris (Ravulizumab) loading dose 2700 mg/M2 followed by maintenance dose 3300 mg/M2 every 8 weeks.  First dose of treatment October 19, 2018.  INTERVAL HISTORY: Travis Mcdaniel 48 y.o. male returns to the clinic today for a follow-up visit.  The patient is feeling well today without any concerning complaints.  He denies any fever, chills, night sweats, or fatigue.  He denies any chest pain, shortness of breath, cough, or hemoptysis.  He denies any nausea, vomiting, diarrhea, constipation, or abdominal pain.  He denies any headaches or visual changes. He denies any yellowing skin/eyes or dark urine. He tolerated his last dose of Solaris well without any adverse effects. He receives his treatments at an outside facility. He was supposed to start the first dose of Ultomiris, however; he will receive one more dose of Solaris today and begin treatment with Ultomiris in 2 weeks.  He is here for evaluation and routine labs before receiving his treatment today.   MEDICAL HISTORY: Past Medical History:  Diagnosis Date  . Cancer (HCC)    PNH (Bone marrow)  . Diverticulosis   . Encounter  for antineoplastic chemotherapy 01/15/2016  . Hypertension     ALLERGIES:  has No Known Allergies.  MEDICATIONS:  Current Outpatient Medications  Medication Sig Dispense Refill  . ALPRAZolam (XANAX) 1 MG tablet Take 1 mg by mouth 3 (three) times daily.  2  . aspirin EC 81 MG tablet Take 81 mg by mouth.    . Eculizumab (SOLIRIS IV) Inject 900 mg into the vein every 14 (fourteen) days. [facility]    . indomethacin (INDOCIN) 50 MG capsule     . lisinopril-hydrochlorothiazide (PRINZIDE,ZESTORETIC) 20-25 MG tablet Take 1 tablet by mouth daily.    . Multiple Vitamin (MULTIVITAMIN WITH MINERALS) TABS tablet Take 1 tablet by mouth daily.    . Multiple Vitamins-Minerals (MULTIVITAMIN ADULT PO) Take by mouth.    . predniSONE (DELTASONE) 10 MG tablet TAKE 6 TABS DAILY FOR 1 WK, 4 TABS DAILY FOR 1 WK, 2 TABS DAILY FOR 1 WK, 1 TAB DAILY FOR 1 WK 90 tablet 0  . Psyllium (METAMUCIL PO) Take 1 capsule by mouth daily.    [name] Kitchen tetrahydrozoline 0.05 % ophthalmic solution Place 1 drop into both eyes 2 (two) times daily as needed (eye irritation).    . traMADol (ULTRAM) 50 MG tablet Take by mouth every 6 (six) hours as needed.     No current facility-administered medications for this visit.     SURGICAL HISTORY: No past surgical history on file.  REVIEW OF SYSTEMS:   Review of Systems  Constitutional: Positive for weight loss (intentional). Negative for appetite change, chills, fatigue, and fever.  HENT:   Negative for mouth  sores, nosebleeds, sore throat and trouble swallowing.   Eyes: Negative for eye problems and icterus.  Respiratory: Negative for cough, hemoptysis, shortness of breath and wheezing.   Cardiovascular: Negative for chest pain and leg swelling.  Gastrointestinal: Negative for abdominal pain, constipation, diarrhea, nausea and vomiting.  Genitourinary: Negative for bladder incontinence, difficulty urinating, dysuria, dark urine, increased urinary frequency and  hematuria.   Musculoskeletal: Negative for back pain, gait problem, neck pain and neck stiffness.  Skin: Negative for itching and rash.  Neurological: Negative for dizziness, extremity weakness, gait problem, headaches, light-headedness and seizures.  Hematological: Negative for adenopathy. Does not bruise/bleed easily.  Psychiatric/Behavioral: Negative for confusion, depression and sleep disturbance. The patient is not nervous/anxious.     PHYSICAL EXAMINATION:  Blood pressure (!) 149/84, pulse 96, temperature 98.5 F (36.9 C), temperature source Oral, resp. rate 20, weight 174 lb 14.4 oz (79.3 kg), SpO2 100 %.  ECOG PERFORMANCE STATUS: 1 - Symptomatic but completely ambulatory  Physical Exam  Constitutional: Oriented to person, place, and time and well-developed, well-nourished, and in no distress.  HENT:  Head: Normocephalic and atraumatic.  Mouth/Throat: Oropharynx is clear and moist. No oropharyngeal exudate.  Eyes: Conjunctivae are normal. Right eye exhibits no discharge. Left eye exhibits no discharge. No scleral icterus.  Neck: Normal range of motion. Neck supple.  Cardiovascular: Normal rate, regular rhythm, normal heart sounds and intact distal pulses.   Pulmonary/Chest: Effort normal and breath sounds normal. No respiratory distress. No wheezes. No rales.  Abdominal: Soft. Bowel sounds are normal. Exhibits no distension and no mass. There is no tenderness.  Musculoskeletal: Normal range of motion. Exhibits no edema.  Lymphadenopathy:    No cervical adenopathy.  Neurological: Alert and oriented to person, place, and time. Exhibits normal muscle tone. Gait normal. Coordination normal.  Skin: Skin is warm and dry. No rash noted. Not diaphoretic. No erythema. No pallor.  Psychiatric: Mood, memory and judgment normal.  Vitals reviewed.  LABORATORY DATA: Lab Results  Component Value Date   WBC 4.0 10/05/2018   HGB 12.8 (L) 10/05/2018   HCT 38.3 (L) 10/05/2018   MCV 116.1  (H) 10/05/2018   PLT 147 (L) 10/05/2018      Chemistry      Component Value Date/Time   NA 139 10/05/2018 0809   NA 136 06/28/2017 1236   K 3.8 10/05/2018 0809   K 4.1 06/28/2017 1236   CL 104 10/05/2018 0809   CO2 23 10/05/2018 0809   CO2 24 06/28/2017 1236   BUN 12 10/05/2018 0809   BUN 17.1 06/28/2017 1236   CREATININE 0.98 10/05/2018 0809   CREATININE 0.93 03/21/2018 0814   CREATININE 1.0 06/28/2017 1236      Component Value Date/Time   CALCIUM 9.4 10/05/2018 0809   CALCIUM 9.1 06/28/2017 1236   ALKPHOS 69 10/05/2018 0809   ALKPHOS 66 06/28/2017 1236   AST 94 (H) 10/05/2018 0809   AST 21 03/21/2018 0814   AST 40 (H) 06/28/2017 1236   ALT 154 (H) 10/05/2018 0809   ALT 31 03/21/2018 0814   ALT 60 (H) 06/28/2017 1236   BILITOT 2.3 (H) 10/05/2018 0809   BILITOT 2.6 (H) 03/21/2018 0814   BILITOT 2.87 (H) 06/28/2017 1236       RADIOGRAPHIC STUDIES:  No results found.   ASSESSMENT/PLAN:  This is a very pleasant 48 year old Caucasian male with paroxysmal nocturnal hemoglobinuria.  He is currently on treatment with Solaris on days 1 and 15 every 4 weeks.  He is status  post 45 cycles. He received an additional 17 cycles at an outside infusion center.  He also had previously completed a tapering dose of prednisone that was started in September/October of 2019 for an episode of hemolytic anemia.   The patient was seen with Dr. Julien Nordmann today. The patient is feeling well. He continues to tolerate his treatment with Solaris well without any adverse effects. He was supposed to start his first cycle of UItomiris today; however, it cannot be started today at his infusion center. We recommend that he recieve one more treatment with Solaris today and begin his treatment with Ultomiris in 2 weeks.  The patient received a meningococcal vaccine today while in clinic.   We will see the patient back in 2 weeks for evaluation and repeat blood work before starting his new treatment with  Ultomiris.   The patient was advised to call immediately if he has any concerning symptoms in the interval. The patient voices understanding of current disease status and treatment options and is in agreement with the current care plan. All questions were answered. The patient knows to call the clinic with any problems, questions or concerns. We can certainly see the patient much sooner if necessary  No orders of the defined types were placed in this encounter.    Cassandra L Heilingoetter, [name] 10/05/18  ADDENDUM: Hematology/Oncology Attending: I had a face-to-face encounter with the patient today.  I recommended his care plan.  [name] with paroxysmal nocturnal hemoglobinuria and currently on treatment with Solaris status post 62 cycles and has been tolerating it well except for few episodes of hemolysis in the last few months. He is interested in switching to Ultomiris because of the convenience and ability to work during his treatment. The patient received his meningococcal vaccine few weeks ago. His insurance would require him to receive this treatment in an outside infusion center because of the facility fee. We already sent the order to the infusion center in [place] and the patient is expected to start the first dose of Ultomiris in 2 weeks. For today he will proceed with his current treatment with Solaris. The patient will come back for follow-up visit in 2 weeks for evaluation before starting his first dose of Ultomiris. There was advised to call immediately if he has any concerning symptoms interval.  Disclaimer: This note was dictated with voice recognition software. Similar sounding words can inadvertently be transcribed and may be missed upon review. Eilleen Kempf, MD 10/05/18

## 2018-10-17 ENCOUNTER — Telehealth: Payer: Self-pay | Admitting: Internal Medicine

## 2018-10-17 NOTE — Telephone Encounter (Signed)
Called regarding upcoming [facility] appointment, left a patient a voicemail and due to the patient not having an e-mail listed this will be a telephone visit.

## 2018-10-18 ENCOUNTER — Telehealth: Payer: Self-pay | Admitting: Medical Oncology

## 2018-10-18 NOTE — Telephone Encounter (Signed)
Dr Julien Nordmann said to continue with Solaris until Ultomiris approved.

## 2018-10-18 NOTE — Telephone Encounter (Addendum)
Ultimeris not authorized yet -still pending... Continue with solaris or wait until Ultimeris approved? Per Dr Julien Nordmann he said for pt to continue with Solaris until Ultomiris authorized. [facility] pharmacy notified.

## 2018-10-19 ENCOUNTER — Inpatient Hospital Stay (HOSPITAL_BASED_OUTPATIENT_CLINIC_OR_DEPARTMENT_OTHER): Payer: BLUE CROSS/BLUE SHIELD | Admitting: Internal Medicine

## 2018-10-19 ENCOUNTER — Other Ambulatory Visit: Payer: Self-pay

## 2018-10-19 ENCOUNTER — Encounter: Payer: Self-pay | Admitting: Internal Medicine

## 2018-10-19 ENCOUNTER — Inpatient Hospital Stay: Payer: BLUE CROSS/BLUE SHIELD

## 2018-10-19 DIAGNOSIS — Z7982 Long term (current) use of aspirin: Secondary | ICD-10-CM | POA: Diagnosis not present

## 2018-10-19 DIAGNOSIS — I10 Essential (primary) hypertension: Secondary | ICD-10-CM | POA: Diagnosis not present

## 2018-10-19 DIAGNOSIS — Z5111 Encounter for antineoplastic chemotherapy: Secondary | ICD-10-CM

## 2018-10-19 DIAGNOSIS — D598 Other acquired hemolytic anemias: Secondary | ICD-10-CM

## 2018-10-19 DIAGNOSIS — Z79899 Other long term (current) drug therapy: Secondary | ICD-10-CM | POA: Diagnosis not present

## 2018-10-19 DIAGNOSIS — D595 Paroxysmal nocturnal hemoglobinuria [Marchiafava-Micheli]: Secondary | ICD-10-CM

## 2018-10-19 LAB — CBC WITH DIFFERENTIAL/PLATELET
Abs Immature Granulocytes: 0.02 10*3/uL (ref 0.00–0.07)
Basophils Absolute: 0 10*3/uL (ref 0.0–0.1)
Basophils Relative: 1 %
Eosinophils Absolute: 0.2 10*3/uL (ref 0.0–0.5)
Eosinophils Relative: 4 %
HCT: 41 % (ref 39.0–52.0)
Hemoglobin: 14 g/dL (ref 13.0–17.0)
Immature Granulocytes: 1 %
Lymphocytes Relative: 29 %
Lymphs Abs: 1.1 10*3/uL (ref 0.7–4.0)
MCH: 39.4 pg — ABNORMAL HIGH (ref 26.0–34.0)
MCHC: 34.1 g/dL (ref 30.0–36.0)
MCV: 115.5 fL — ABNORMAL HIGH (ref 80.0–100.0)
Monocytes Absolute: 0.4 10*3/uL (ref 0.1–1.0)
Monocytes Relative: 10 %
Neutro Abs: 2.1 10*3/uL (ref 1.7–7.7)
Neutrophils Relative %: 55 %
Platelets: 144 10*3/uL — ABNORMAL LOW (ref 150–400)
RBC: 3.55 MIL/uL — ABNORMAL LOW (ref 4.22–5.81)
RDW: 15.8 % — ABNORMAL HIGH (ref 11.5–15.5)
WBC: 3.7 10*3/uL — ABNORMAL LOW (ref 4.0–10.5)
nRBC: 0 % (ref 0.0–0.2)

## 2018-10-19 LAB — COMPREHENSIVE METABOLIC PANEL
ALT: 282 U/L (ref 0–44)
AST: 201 U/L (ref 15–41)
Albumin: 4.5 g/dL (ref 3.5–5.0)
Alkaline Phosphatase: 77 U/L (ref 38–126)
Anion gap: 12 (ref 5–15)
BUN: 9 mg/dL (ref 6–20)
CO2: 25 mmol/L (ref 22–32)
Calcium: 9.2 mg/dL (ref 8.9–10.3)
Chloride: 102 mmol/L (ref 98–111)
Creatinine, Ser: 1.05 mg/dL (ref 0.61–1.24)
GFR calc Af Amer: >60 mL/min (ref >60)
GFR calc non Af Amer: >60 mL/min (ref >60)
Glucose, Bld: 93 mg/dL (ref 70–99)
Potassium: 3.8 mmol/L (ref 3.5–5.1)
Sodium: 139 mmol/L (ref 135–145)
Total Bilirubin: 3.1 mg/dL — ABNORMAL HIGH (ref 0.3–1.2)
Total Protein: 7.6 g/dL (ref 6.5–8.1)

## 2018-10-19 LAB — LACTATE DEHYDROGENASE: LDH: 362 U/L — ABNORMAL HIGH (ref 98–192)

## 2018-10-19 NOTE — Progress Notes (Signed)
[facility] [phone]   [phone]  PROGRESS NOTE FOR TELEMEDICINE VISITS  [name], MD [address] [name] [address]  I connected with@ on 10/19/18 at  8:30 AM EDT by telephone visit and verified that I am speaking with the correct person using two identifiers.   I discussed the limitations, risks, security and privacy concerns of performing an evaluation and management service by telemedicine and the availability of in-person appointments. I also discussed with the patient that there may be a patient responsible charge related to this service. The patient expressed understanding and agreed to proceed.  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  PRIOR THERAPY:  1) The patient was previously treated with a tapering dose of prednisone and high-dose IVIG for the hemolytic anemia before his diagnosis of paroxysmal nocturnal hemoglobinuria. 2) Soliris (Eculizumab) initially with induction 600 MG IV weekly for 4 weeks and currently on maintenance treatment 900 MG IV every 2 weeks. First dose of treatment was given on 11/21/2014. Status post 45 cycles. After cycle #45 the patient has been receiving his treatment at outside infusion center. Status post 17 more cycles of treatment. His last treatment is expected April 8th, 2020.   CURRENT THERAPY: Ultomiris (Ravulizumab) loading dose 2700 mg/M2 followed by maintenance dose 3300 mg/M2 every 8 weeks. First dose of treatment  was expecting October 19, 2018.  INTERVAL HISTORY: Travis Mcdaniel 48 y.o. male has a telephone virtual visit with me today for evaluation and recommendation regarding his condition.  The patient is feeling fine today with no concerning complaints.  He denied having  any chest pain, shortness of breath, cough or hemoptysis.  He denied having any fever or chills.  He has no nausea, vomiting, diarrhea or constipation.  He continues to tolerate his treatment with Solaris fairly well.  He was supposed to start today the first dose of Ultomiris but unfortunately this was not approved by his insurance yet.  MEDICAL HISTORY: Past Medical History:  Diagnosis Date  . Cancer (HCC)    PNH (Bone marrow)  . Diverticulosis   . Encounter for antineoplastic chemotherapy 01/15/2016  . Hypertension     ALLERGIES:  has No Known Allergies.  MEDICATIONS:  Current Outpatient Medications  Medication Sig Dispense Refill  . ALPRAZolam (XANAX) 1 MG tablet Take 1 mg by mouth 3 (three) times daily.  2  . aspirin EC 81 MG tablet Take 81 mg by mouth.    . Eculizumab (SOLIRIS IV) Inject 900 mg into the vein every 14 (fourteen) days. [facility]    . indomethacin (INDOCIN) 50 MG capsule     . lisinopril-hydrochlorothiazide (PRINZIDE,ZESTORETIC) 20-25 MG tablet Take 1 tablet by mouth daily.    . Multiple Vitamin (MULTIVITAMIN WITH MINERALS) TABS tablet Take 1 tablet by mouth daily.    . Multiple Vitamins-Minerals (MULTIVITAMIN ADULT PO) Take by mouth.    . predniSONE (DELTASONE) 10 MG tablet TAKE 6 TABS DAILY FOR 1 WK, 4 TABS DAILY FOR 1 WK, 2 TABS DAILY FOR 1 WK, 1 TAB DAILY FOR 1 WK 90 tablet 0  . Psyllium (METAMUCIL PO) Take 1 capsule by mouth daily.    [name] Kitchen tetrahydrozoline 0.05 % ophthalmic solution Place 1 drop into both eyes 2 (two) times daily as needed (eye irritation).    [name] Kitchen  traMADol (ULTRAM) 50 MG tablet Take by mouth every 6 (six) hours as needed.     No current facility-administered medications for this visit.     SURGICAL HISTORY: No past surgical history on file.  REVIEW OF SYSTEMS:  A comprehensive review of systems was negative except for: Constitutional: positive for fatigue    LABORATORY DATA: Lab Results  Component Value Date   WBC  3.7 (L) 10/19/2018   HGB 14.0 10/19/2018   HCT 41.0 10/19/2018   MCV 115.5 (H) 10/19/2018   PLT 144 (L) 10/19/2018      Chemistry      Component Value Date/Time   NA 139 10/05/2018 0809   NA 136 06/28/2017 1236   K 3.8 10/05/2018 0809   K 4.1 06/28/2017 1236   CL 104 10/05/2018 0809   CO2 23 10/05/2018 0809   CO2 24 06/28/2017 1236   BUN 12 10/05/2018 0809   BUN 17.1 06/28/2017 1236   CREATININE 0.98 10/05/2018 0809   CREATININE 0.93 03/21/2018 0814   CREATININE 1.0 06/28/2017 1236      Component Value Date/Time   CALCIUM 9.4 10/05/2018 0809   CALCIUM 9.1 06/28/2017 1236   ALKPHOS 69 10/05/2018 0809   ALKPHOS 66 06/28/2017 1236   AST 94 (H) 10/05/2018 0809   AST 21 03/21/2018 0814   AST 40 (H) 06/28/2017 1236   ALT 154 (H) 10/05/2018 0809   ALT 31 03/21/2018 0814   ALT 60 (H) 06/28/2017 1236   BILITOT 2.3 (H) 10/05/2018 0809   BILITOT 2.6 (H) 03/21/2018 0814   BILITOT 2.87 (H) 06/28/2017 1236       RADIOGRAPHIC STUDIES: No results found.  ASSESSMENT AND PLAN: [name] with paroxysmal nocturnal hemoglobinuria and currently on treatment with Soliris on days 1 and 15 every 4 weeks status post 45 cycles.  He is also on the same treatment received at outside infusion center status post 18 more cycles. He also completed a course of tapered dose of prednisone. The patient has been tolerating the previous treatment well except for few hemolytic episodes treated with prednisone. He was supposed to start the first dose of Ultomiris today but unfortunately is not approved by his insurance yet. I recommended for the patient to continue his current treatment with Solaris for now. Comprehensive metabolic panel today showed elevation of his liver enzymes.  This could be secondary to his increased alcohol drinking or early signs of hemolysis again. I will repeat his CBC and comprehensive metabolic panel next week for further evaluation of his  condition and if it continues to be elevated we will consider the patient for a taper dose of prednisone again. We will continue to work with his insurance to get the approval of Ultomiris. The patient will come back for follow-up visit in 2 weeks for evaluation before the next treatment. He was advised to call immediately if he has any concerning symptoms in the interval. I discussed the assessment and treatment plan with the patient. The patient was provided an opportunity to ask questions and all were answered. The patient agreed with the plan and demonstrated an understanding of the instructions.   The patient was advised to call back or seek an in-person evaluation if the symptoms worsen or if the condition fails to improve as anticipated.  I provided 11 minutes of non face-to-face telephone visit time during this encounter, and > 50% was spent counseling as documented under my assessment & plan.  Eilleen Kempf, MD 10/19/2018 8:32 AM  Disclaimer: This note was dictated with voice recognition software. Similar sounding words can inadvertently be transcribed and may not be corrected upon review.

## 2018-10-21 ENCOUNTER — Telehealth: Payer: Self-pay | Admitting: Internal Medicine

## 2018-10-21 ENCOUNTER — Telehealth: Payer: Self-pay | Admitting: Medical Oncology

## 2018-10-21 NOTE — Telephone Encounter (Signed)
Scheduled appt per 4/24 sch message - unable to reach patient . Left message with appt date and time

## 2018-10-21 NOTE — Telephone Encounter (Signed)
Travis Bears, MD  Ardeen Garland, RN        Liver enzymes are elevated. Please check and make sure he is not drinking too much recently. He may need repeat CBC and comprehensive metabolic panel next week before considering him for treatment with prednisone again.    OL:V with Dr Worthy Flank information and sent schedule message and ordered labs.

## 2018-10-21 NOTE — Telephone Encounter (Signed)
Pt reports he "Kicked up his drinking a notch" He is drinking 6-8 beers a day . He is isolated and not working and "is depressed".  He said he is going to stop drinking until wed. I instructed him to contact PCP for something for depression. I also instructed him to cut back on his daily drinking . " I will dry out this weekend or drink 'near beer' ".

## 2018-10-26 ENCOUNTER — Telehealth: Payer: Self-pay | Admitting: Medical Oncology

## 2018-10-26 ENCOUNTER — Other Ambulatory Visit: Payer: Self-pay

## 2018-10-26 ENCOUNTER — Inpatient Hospital Stay: Payer: BLUE CROSS/BLUE SHIELD

## 2018-10-26 DIAGNOSIS — D595 Paroxysmal nocturnal hemoglobinuria [Marchiafava-Micheli]: Secondary | ICD-10-CM | POA: Diagnosis not present

## 2018-10-26 LAB — CBC WITH DIFFERENTIAL/PLATELET
Abs Immature Granulocytes: 0.01 10*3/uL (ref 0.00–0.07)
Basophils Absolute: 0 10*3/uL (ref 0.0–0.1)
Basophils Relative: 1 %
Eosinophils Absolute: 0.1 10*3/uL (ref 0.0–0.5)
Eosinophils Relative: 4 %
HCT: 39.6 % (ref 39.0–52.0)
Hemoglobin: 13.5 g/dL (ref 13.0–17.0)
Immature Granulocytes: 0 %
Lymphocytes Relative: 28 %
Lymphs Abs: 1.1 10*3/uL (ref 0.7–4.0)
MCH: 37.9 pg — ABNORMAL HIGH (ref 26.0–34.0)
MCHC: 34.1 g/dL (ref 30.0–36.0)
MCV: 111.2 fL — ABNORMAL HIGH (ref 80.0–100.0)
Monocytes Absolute: 0.4 10*3/uL (ref 0.1–1.0)
Monocytes Relative: 12 %
Neutro Abs: 2.1 10*3/uL (ref 1.7–7.7)
Neutrophils Relative %: 55 %
Platelets: 143 10*3/uL — ABNORMAL LOW (ref 150–400)
RBC: 3.56 MIL/uL — ABNORMAL LOW (ref 4.22–5.81)
RDW: 13.4 % (ref 11.5–15.5)
WBC: 3.7 10*3/uL — ABNORMAL LOW (ref 4.0–10.5)
nRBC: 0 % (ref 0.0–0.2)

## 2018-10-26 LAB — COMPREHENSIVE METABOLIC PANEL
ALT: 216 U/L — ABNORMAL HIGH (ref 0–44)
AST: 105 U/L — ABNORMAL HIGH (ref 15–41)
Albumin: 4.3 g/dL (ref 3.5–5.0)
Alkaline Phosphatase: 62 U/L (ref 38–126)
Anion gap: 11 (ref 5–15)
BUN: 15 mg/dL (ref 6–20)
CO2: 23 mmol/L (ref 22–32)
Calcium: 9 mg/dL (ref 8.9–10.3)
Chloride: 105 mmol/L (ref 98–111)
Creatinine, Ser: 0.98 mg/dL (ref 0.61–1.24)
GFR calc Af Amer: >60 mL/min (ref >60)
GFR calc non Af Amer: >60 mL/min (ref >60)
Glucose, Bld: 119 mg/dL — ABNORMAL HIGH (ref 70–99)
Potassium: 3.7 mmol/L (ref 3.5–5.1)
Sodium: 139 mmol/L (ref 135–145)
Total Bilirubin: 2.4 mg/dL — ABNORMAL HIGH (ref 0.3–1.2)
Total Protein: 7.2 g/dL (ref 6.5–8.1)

## 2018-10-26 LAB — LACTATE DEHYDROGENASE: LDH: 264 U/L — ABNORMAL HIGH (ref 98–192)

## 2018-10-26 NOTE — Telephone Encounter (Signed)
Pt notified of lab results and that he does not need prednisone.

## 2018-10-26 NOTE — Telephone Encounter (Signed)
No. He is good. Thank you.

## 2018-10-26 NOTE — Telephone Encounter (Signed)
Labs resulted.Does he need prednisone?

## 2018-11-01 ENCOUNTER — Inpatient Hospital Stay: Payer: BLUE CROSS/BLUE SHIELD | Attending: Internal Medicine

## 2018-11-01 ENCOUNTER — Other Ambulatory Visit: Payer: Self-pay

## 2018-11-01 ENCOUNTER — Inpatient Hospital Stay (HOSPITAL_BASED_OUTPATIENT_CLINIC_OR_DEPARTMENT_OTHER): Payer: BLUE CROSS/BLUE SHIELD | Admitting: Physician Assistant

## 2018-11-01 ENCOUNTER — Encounter: Payer: Self-pay | Admitting: Physician Assistant

## 2018-11-01 VITALS — BP 124/75 | HR 82 | Temp 98.3°F | Resp 18 | Ht 66.0 in | Wt 177.5 lb

## 2018-11-01 DIAGNOSIS — Z7982 Long term (current) use of aspirin: Secondary | ICD-10-CM | POA: Diagnosis not present

## 2018-11-01 DIAGNOSIS — Z5111 Encounter for antineoplastic chemotherapy: Secondary | ICD-10-CM

## 2018-11-01 DIAGNOSIS — Z79899 Other long term (current) drug therapy: Secondary | ICD-10-CM

## 2018-11-01 DIAGNOSIS — D595 Paroxysmal nocturnal hemoglobinuria [Marchiafava-Micheli]: Secondary | ICD-10-CM

## 2018-11-01 DIAGNOSIS — I10 Essential (primary) hypertension: Secondary | ICD-10-CM | POA: Insufficient documentation

## 2018-11-01 DIAGNOSIS — F1020 Alcohol dependence, uncomplicated: Secondary | ICD-10-CM

## 2018-11-01 LAB — CBC WITH DIFFERENTIAL (CANCER CENTER ONLY)
Abs Immature Granulocytes: 0.02 10*3/uL (ref 0.00–0.07)
Basophils Absolute: 0 10*3/uL (ref 0.0–0.1)
Basophils Relative: 1 %
Eosinophils Absolute: 0.2 10*3/uL (ref 0.0–0.5)
Eosinophils Relative: 3 %
HCT: 41.3 % (ref 39.0–52.0)
Hemoglobin: 13.7 g/dL (ref 13.0–17.0)
Immature Granulocytes: 0 %
Lymphocytes Relative: 26 %
Lymphs Abs: 1.4 10*3/uL (ref 0.7–4.0)
MCH: 37.4 pg — ABNORMAL HIGH (ref 26.0–34.0)
MCHC: 33.2 g/dL (ref 30.0–36.0)
MCV: 112.8 fL — ABNORMAL HIGH (ref 80.0–100.0)
Monocytes Absolute: 0.6 10*3/uL (ref 0.1–1.0)
Monocytes Relative: 10 %
Neutro Abs: 3.2 10*3/uL (ref 1.7–7.7)
Neutrophils Relative %: 60 %
Platelet Count: 142 10*3/uL — ABNORMAL LOW (ref 150–400)
RBC: 3.66 MIL/uL — ABNORMAL LOW (ref 4.22–5.81)
RDW: 13.9 % (ref 11.5–15.5)
WBC Count: 5.4 10*3/uL (ref 4.0–10.5)
nRBC: 0.4 % — ABNORMAL HIGH (ref 0.0–0.2)

## 2018-11-01 LAB — CMP (CANCER CENTER ONLY)
ALT: 141 U/L — ABNORMAL HIGH (ref 0–44)
AST: 81 U/L — ABNORMAL HIGH (ref 15–41)
Albumin: 4.3 g/dL (ref 3.5–5.0)
Alkaline Phosphatase: 67 U/L (ref 38–126)
Anion gap: 12 (ref 5–15)
BUN: 17 mg/dL (ref 6–20)
CO2: 23 mmol/L (ref 22–32)
Calcium: 9.2 mg/dL (ref 8.9–10.3)
Chloride: 104 mmol/L (ref 98–111)
Creatinine: 0.98 mg/dL (ref 0.61–1.24)
GFR, Est AFR Am: >60 mL/min (ref >60)
GFR, Estimated: >60 mL/min (ref >60)
Glucose, Bld: 93 mg/dL (ref 70–99)
Potassium: 4.1 mmol/L (ref 3.5–5.1)
Sodium: 139 mmol/L (ref 135–145)
Total Bilirubin: 1.8 mg/dL — ABNORMAL HIGH (ref 0.3–1.2)
Total Protein: 7.2 g/dL (ref 6.5–8.1)

## 2018-11-01 LAB — LACTATE DEHYDROGENASE: LDH: 262 U/L — ABNORMAL HIGH (ref 98–192)

## 2018-11-01 NOTE — Progress Notes (Signed)
[facility] in [place] gave weight for upcoming treatment tomorrow to nurse TK.    Provided follow up number in case they need anything else.

## 2018-11-01 NOTE — Progress Notes (Signed)
[facility] OFFICE PROGRESS NOTE  [name], MD [address] 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  PRIOR THERAPY: 1) The patient was previously treated with a tapering dose of prednisone and high-dose IVIG for the hemolytic anemia before his diagnosis of paroxysmal nocturnal hemoglobinuria. 2) Soliris (Eculizumab) initially with induction 600 MG IV weekly for 4 weeks and currently on maintenance treatment 900 MG IV every 2 weeks. First dose of treatment was given on 11/21/2014. Status post 45 cycles. After cycle #45 the patient has been receiving his treatment at outside infusion center. Status post 19 more cycles of treatment. His last treatment is on April 22th, 2020.   CURRENT THERAPY: Ultomiris (Ravulizumab) loading dose 2700 mg/M2 followed by maintenance dose 3300 mg/M2 every 8 weeks. First dose of treatment is expected on May 6th, 2020.  INTERVAL HISTORY: Travis Mcdaniel 48 y.o. male returns to the clinic for a follow-up visit. The patient is feeling well today without any concerning complaints. He denies any fever, chills, night sweats, or weight loss.  He endorses some mild fatigue.  He denies any chest pain, shortness of breath, cough, or hemoptysis. He denies any nausea, vomiting, diarrhea, constipation, or abdominal pain.  He denies any headaches or visual changes.  He denies any dark urine. The patient states that he had increased his alcohol consumption while in quarantine. His LFT's had recently increased on routine labs and the patient was notified to cut back. He states he is currently drinking approximately a 12 pack of beer weekly.   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 Therefore, the  patient was supposed to start treatment with Ultomiris due to the decreased frequency of infusions; however, he had been having difficulty getting this approved by his insurance. He is here for evaluation and routine labs before starting his first treatment with Ultomiris tomorrow.    MEDICAL HISTORY: Past Medical History:  Diagnosis Date  . Cancer (HCC)    PNH (Bone marrow)  . Diverticulosis   . Encounter for antineoplastic chemotherapy 01/15/2016  . Hypertension     ALLERGIES:  has No Known Allergies.  MEDICATIONS:  Current Outpatient Medications  Medication Sig Dispense Refill  . ALPRAZolam (XANAX) 1 MG tablet Take 1 mg by mouth 3 (three) times daily.  2  . aspirin EC 81 MG tablet Take 81 mg by mouth.    . Eculizumab (SOLIRIS IV) Inject 900 mg into the vein every 14 (fourteen) days. [facility]    . lisinopril-hydrochlorothiazide (PRINZIDE,ZESTORETIC) 20-25 MG tablet Take 1 tablet by mouth daily.    . Multiple Vitamin (MULTIVITAMIN WITH MINERALS) TABS tablet Take 1 tablet by mouth daily.    . Multiple Vitamins-Minerals (MULTIVITAMIN ADULT PO) Take by mouth.    . Psyllium (METAMUCIL PO) Take 1 capsule by mouth daily.    [name] Kitchen tetrahydrozoline 0.05 % ophthalmic solution Place 1 drop into both eyes 2 (two) times daily as needed (eye irritation).    . traMADol (ULTRAM) 50 MG tablet Take by mouth every 6 (six) hours as needed.    . indomethacin (INDOCIN) 50 MG capsule     . predniSONE (DELTASONE) 10 MG tablet TAKE 6 TABS DAILY FOR 1 WK, 4 TABS DAILY FOR 1 WK, 2 TABS DAILY FOR 1 WK, 1  TAB DAILY FOR 1 WK (Patient not taking: Reported on 11/01/2018) 90 tablet 0   No current facility-administered medications for this visit.     SURGICAL HISTORY: History reviewed. No pertinent surgical history.  REVIEW OF SYSTEMS:   Review of Systems  Constitutional: Positive for fatigue. Negative for appetite change, chills, fever and unexpected weight change.  HENT:   Negative for  mouth sores, nosebleeds, sore throat and trouble swallowing.   Eyes: Negative for eye problems and icterus.  Respiratory: Negative for cough, hemoptysis, shortness of breath and wheezing.   Cardiovascular: Negative for chest pain and leg swelling.  Gastrointestinal: Negative for abdominal pain, constipation, diarrhea, nausea and vomiting.  Genitourinary: Negative for bladder incontinence, difficulty urinating, dysuria, frequency and hematuria.   Musculoskeletal: Negative for back pain, gait problem, neck pain and neck stiffness.  Skin: Negative for itching and rash.  Neurological: Negative for dizziness, extremity weakness, gait problem, headaches, light-headedness and seizures.  Hematological: Negative for adenopathy. Does not bruise/bleed easily.  Psychiatric/Behavioral: Negative for confusion, depression and sleep disturbance. The patient is not nervous/anxious.     PHYSICAL EXAMINATION:  Blood pressure 124/75, pulse 82, temperature 98.3 F (36.8 C), temperature source Oral, resp. rate 18, height 5\' 6"  (1.676 m), weight 177 lb 8 oz (80.5 kg), SpO2 100 %.  ECOG PERFORMANCE STATUS: 1 - Symptomatic but completely ambulatory  Physical Exam  Constitutional: Oriented to person, place, and time and well-developed, well-nourished, and in no distress. HENT:  Head: Normocephalic and atraumatic.  Mouth/Throat: Oropharynx is clear and moist. No oropharyngeal exudate.  Eyes: Conjunctivae are normal. Right eye exhibits no discharge. Left eye exhibits no discharge. No scleral icterus.  Neck: Normal range of motion. Neck supple.  Cardiovascular: Normal rate, regular rhythm, normal heart sounds and intact distal pulses.   Pulmonary/Chest: Effort normal and breath sounds normal. No respiratory distress. No wheezes. No rales.  Abdominal: Soft. Bowel sounds are normal. Exhibits no distension and no mass. There is no tenderness.  Musculoskeletal: Normal range of motion. Exhibits no edema.   Lymphadenopathy:    No cervical adenopathy.  Neurological: Alert and oriented to person, place, and time. Exhibits normal muscle tone. Gait normal. Coordination normal.  Skin: Skin is warm and dry. No rash noted. Not diaphoretic. No erythema. No pallor.  Psychiatric: Mood, memory and judgment normal.  Vitals reviewed.  LABORATORY DATA: Lab Results  Component Value Date   WBC 5.4 11/01/2018   HGB 13.7 11/01/2018   HCT 41.3 11/01/2018   MCV 112.8 (H) 11/01/2018   PLT 142 (L) 11/01/2018      Chemistry      Component Value Date/Time   NA 139 11/01/2018 1149   NA 136 06/28/2017 1236   K 4.1 11/01/2018 1149   K 4.1 06/28/2017 1236   CL 104 11/01/2018 1149   CO2 23 11/01/2018 1149   CO2 24 06/28/2017 1236   BUN 17 11/01/2018 1149   BUN 17.1 06/28/2017 1236   CREATININE 0.98 11/01/2018 1149   CREATININE 1.0 06/28/2017 1236      Component Value Date/Time   CALCIUM 9.2 11/01/2018 1149   CALCIUM 9.1 06/28/2017 1236   ALKPHOS 67 11/01/2018 1149   ALKPHOS 66 06/28/2017 1236   AST 81 (H) 11/01/2018 1149   AST 40 (H) 06/28/2017 1236   ALT 141 (H) 11/01/2018 1149   ALT 60 (H) 06/28/2017 1236   BILITOT 1.8 (H) 11/01/2018 1149   BILITOT 2.87 (H) 06/28/2017 1236       RADIOGRAPHIC STUDIES:  No  results found.   ASSESSMENT/PLAN:  This is a very pleasant 48 year old Caucasian male with paroxysmal nocturnal hemoglobinuria.  He had been on treatment with Solaris on days 1 and 15 every 4 weeks.  He is status post 45 cycles. He received an additional 19 cycles at an outside infusion center.  He also had previously completed a tapering dose of prednisone that was started in September/October of 2019 for an episode of hemolytic anemia.   Due to convenience and his previous treatment interfering with his work schedule/performance, he is interested in starting Ultomiris. His insurance requires him to receive treatment from an outside infusion center due to the facility fee. He is expected  to start his first treatment with Ultomiris tomorrow on May 6th, 2020. He is expected to receive a loading dose 2700 mg/M2 followed by maintenance dose 3300 mg/M2 every 8 weeks.  The patient was seen with Dr. Julien Nordmann today. He recommend that the patient proceed with Ultomiris tomorrow as scheduled.   I will see the patient back for a follow up in 2 weeks for evaluation and routine blood work before proceeding with treatment #2.   The patient was strongly encouraged to cut back on his alcohol consumption. The patient understands the importance of cutting back on his consumption.  The patient was advised to call immediately if she has any concerning symptoms in the interval. The patient voices understanding of current disease status and treatment options and is in agreement with the current care plan. All questions were answered. The patient knows to call the clinic with any problems, questions or concerns. We can certainly see the patient much sooner if necessary  No orders of the defined types were placed in this encounter.    Cassandra L Heilingoetter, [name] 11/02/18  ADDENDUM: Hematology/Oncology Attending: I had a face-to-face encounter with the patient.  I recommended his care plan.  [name] with paroxysmal nocturnal hemoglobinuria (PNH).  The patient has been on treatment with Solaris for several years now.  He had few episodes of hemolytic anemia during his treatment with Solaris.  He was also unable to continue to work at regular basis because of the frequency of his infusion. We will change his treatment to Ultomiris and he is expected to start the first loading today.  The patient is doing fine with no concerning complaints except for mild fatigue. His blood work is acceptable to proceed with the treatment as planned. We will see him back for follow-up visit in 2 weeks for evaluation with repeat blood work before starting the first dose of his  maintenance therapy. The patient strongly advised to avoid drinking too much alcohol because of his dysfunction. Disclaimer: This note was dictated with voice recognition software. Similar sounding words can inadvertently be transcribed and may be missed upon review. Eilleen Kempf, MD 11/02/18

## 2018-11-16 ENCOUNTER — Inpatient Hospital Stay: Payer: BLUE CROSS/BLUE SHIELD

## 2018-11-16 ENCOUNTER — Other Ambulatory Visit: Payer: Self-pay

## 2018-11-16 ENCOUNTER — Inpatient Hospital Stay (HOSPITAL_BASED_OUTPATIENT_CLINIC_OR_DEPARTMENT_OTHER): Payer: BLUE CROSS/BLUE SHIELD | Admitting: Physician Assistant

## 2018-11-16 ENCOUNTER — Encounter: Payer: Self-pay | Admitting: Physician Assistant

## 2018-11-16 VITALS — BP 119/73 | HR 97 | Temp 98.3°F | Resp 18 | Ht 66.0 in | Wt 179.1 lb

## 2018-11-16 DIAGNOSIS — D595 Paroxysmal nocturnal hemoglobinuria [Marchiafava-Micheli]: Secondary | ICD-10-CM

## 2018-11-16 DIAGNOSIS — Z7982 Long term (current) use of aspirin: Secondary | ICD-10-CM

## 2018-11-16 DIAGNOSIS — I10 Essential (primary) hypertension: Secondary | ICD-10-CM

## 2018-11-16 DIAGNOSIS — Z79899 Other long term (current) drug therapy: Secondary | ICD-10-CM

## 2018-11-16 DIAGNOSIS — Z5111 Encounter for antineoplastic chemotherapy: Secondary | ICD-10-CM

## 2018-11-16 LAB — CBC WITH DIFFERENTIAL (CANCER CENTER ONLY)
Abs Immature Granulocytes: 0.01 10*3/uL (ref 0.00–0.07)
Basophils Absolute: 0 10*3/uL (ref 0.0–0.1)
Basophils Relative: 1 %
Eosinophils Absolute: 0.2 10*3/uL (ref 0.0–0.5)
Eosinophils Relative: 5 %
HCT: 39.5 % (ref 39.0–52.0)
Hemoglobin: 13.7 g/dL (ref 13.0–17.0)
Immature Granulocytes: 0 %
Lymphocytes Relative: 26 %
Lymphs Abs: 1.3 10*3/uL (ref 0.7–4.0)
MCH: 39.1 pg — ABNORMAL HIGH (ref 26.0–34.0)
MCHC: 34.7 g/dL (ref 30.0–36.0)
MCV: 112.9 fL — ABNORMAL HIGH (ref 80.0–100.0)
Monocytes Absolute: 0.5 10*3/uL (ref 0.1–1.0)
Monocytes Relative: 11 %
Neutro Abs: 2.9 10*3/uL (ref 1.7–7.7)
Neutrophils Relative %: 57 %
Platelet Count: 132 10*3/uL — ABNORMAL LOW (ref 150–400)
RBC: 3.5 MIL/uL — ABNORMAL LOW (ref 4.22–5.81)
RDW: 15 % (ref 11.5–15.5)
WBC Count: 4.9 10*3/uL (ref 4.0–10.5)
nRBC: 0 % (ref 0.0–0.2)

## 2018-11-16 LAB — CMP (CANCER CENTER ONLY)
ALT: 210 U/L — ABNORMAL HIGH (ref 0–44)
AST: 123 U/L — ABNORMAL HIGH (ref 15–41)
Albumin: 4.3 g/dL (ref 3.5–5.0)
Alkaline Phosphatase: 62 U/L (ref 38–126)
Anion gap: 14 (ref 5–15)
BUN: 15 mg/dL (ref 6–20)
CO2: 22 mmol/L (ref 22–32)
Calcium: 9 mg/dL (ref 8.9–10.3)
Chloride: 103 mmol/L (ref 98–111)
Creatinine: 0.96 mg/dL (ref 0.61–1.24)
GFR, Est AFR Am: >60 mL/min (ref >60)
GFR, Estimated: >60 mL/min (ref >60)
Glucose, Bld: 124 mg/dL — ABNORMAL HIGH (ref 70–99)
Potassium: 3.7 mmol/L (ref 3.5–5.1)
Sodium: 139 mmol/L (ref 135–145)
Total Bilirubin: 2.8 mg/dL — ABNORMAL HIGH (ref 0.3–1.2)
Total Protein: 7.2 g/dL (ref 6.5–8.1)

## 2018-11-16 LAB — LACTATE DEHYDROGENASE: LDH: 294 U/L — ABNORMAL HIGH (ref 98–192)

## 2018-11-16 NOTE — Progress Notes (Signed)
[facility] OFFICE PROGRESS NOTE  [name], MD [address] 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  PRIOR THERAPY: 1)The patient was previously treated with a tapering dose of prednisone and high-dose IVIG for the hemolytic anemia before his diagnosis of paroxysmal nocturnal hemoglobinuria. 2)Soliris (Eculizumab) initially with induction 600 MG IV weekly for 4 weeks then on maintenance treatment 900 MG IV every 2 weeks. First dose of treatment was given on 11/21/2014. Status post 45 cycles. After cycle #45 the patient has been receiving his treatment at outside infusion center. Status post 65more cycles of treatment. His last treatment is on April 22th, 2020.  CURRENT THERAPY: Ultomiris (Ravulizumab) loading dose 2700 mg/M2 followed by maintenance dose 3300 mg/M2 every 8 weeks. First dose of treatmentwas on May 6th, 2020. Status post 1 treatment.   INTERVAL HISTORY: Travis Mcdaniel 48 y.o. male returns to the clinic for a follow-up visit.  The patient is status post his first treatment with Ultomiris and he tolerated it well without any adverse effects.  He denies any fever, chills, night sweats, weight loss, or fatigue.  He denies any chest pain, shortness of breath, cough, or hemoptysis.  He denies any nausea, vomiting, diarrhea, constipation, or abdominal pain.  He denies any headache or visual changes.  He denies any dark urine or jaundice.  He is here today for evaluation and repeat blood work before proceeding with his first maintenance dose of Ultomiris today as scheduled.  MEDICAL HISTORY: Past Medical History:  Diagnosis Date  . Cancer (HCC)    PNH (Bone marrow)  . Diverticulosis   . Encounter for antineoplastic chemotherapy 01/15/2016  . Hypertension     ALLERGIES:  has No Known Allergies.  MEDICATIONS:  Current Outpatient Medications  Medication  Sig Dispense Refill  . ALPRAZolam (XANAX) 1 MG tablet Take 1 mg by mouth 3 (three) times daily.  2  . aspirin EC 81 MG tablet Take 81 mg by mouth.    . indomethacin (INDOCIN) 50 MG capsule     . lisinopril-hydrochlorothiazide (PRINZIDE,ZESTORETIC) 20-25 MG tablet Take 1 tablet by mouth daily.    . Multiple Vitamin (MULTIVITAMIN WITH MINERALS) TABS tablet Take 1 tablet by mouth daily.    . Multiple Vitamins-Minerals (MULTIVITAMIN ADULT PO) Take by mouth.    . Psyllium (METAMUCIL PO) Take 1 capsule by mouth daily.    [name] Kitchen tetrahydrozoline 0.05 % ophthalmic solution Place 1 drop into both eyes 2 (two) times daily as needed (eye irritation).    . traMADol (ULTRAM) 50 MG tablet Take by mouth every 6 (six) hours as needed.     No current facility-administered medications for this visit.     SURGICAL HISTORY: No past surgical history on file.  REVIEW OF SYSTEMS:   Review of Systems  Constitutional: Negative for appetite change, chills, fatigue, fever and unexpected weight change.  HENT:   Negative for mouth sores, nosebleeds, sore throat and trouble swallowing.   Eyes: Negative for eye problems and icterus.  Respiratory: Negative for cough, hemoptysis, shortness of breath and wheezing.   Cardiovascular: Negative for chest pain and leg swelling.  Gastrointestinal: Negative for abdominal pain, constipation, diarrhea, nausea and vomiting.  Genitourinary: Negative for bladder incontinence, difficulty urinating, dysuria, frequency and hematuria.   Musculoskeletal: Negative for back pain, gait problem, neck pain and neck stiffness.  Skin: Negative for itching and rash.  Neurological: Negative for dizziness, extremity weakness, gait problem, headaches,  light-headedness and seizures.  Hematological: Negative for adenopathy. Does not bruise/bleed easily.  Psychiatric/Behavioral: Negative for confusion, depression and sleep disturbance. The patient is not nervous/anxious.     PHYSICAL EXAMINATION:   Blood pressure 119/73, pulse 97, temperature 98.3 F (36.8 C), temperature source Oral, resp. rate 18, height 5\' 6"  (1.676 m), weight 179 lb 1.6 oz (81.2 kg), SpO2 100 %.  ECOG PERFORMANCE STATUS: 0 - Asymptomatic  Physical Exam  Constitutional: Oriented to person, place, and time and well-developed, well-nourished, and in no distress.  HENT:  Head: Normocephalic and atraumatic.  Mouth/Throat: Oropharynx is clear and moist. No oropharyngeal exudate.  Eyes: Conjunctivae are normal. Right eye exhibits no discharge. Left eye exhibits no discharge. No scleral icterus.  Neck: Normal range of motion. Neck supple.  Cardiovascular: Normal rate, regular rhythm, normal heart sounds and intact distal pulses.   Pulmonary/Chest: Effort normal and breath sounds normal. No respiratory distress. No wheezes. No rales.  Abdominal: Soft. Bowel sounds are normal. Exhibits no distension and no mass. There is no tenderness.  Musculoskeletal: Normal range of motion. Exhibits no edema.  Lymphadenopathy:    No cervical adenopathy.  Neurological: Alert and oriented to person, place, and time. Exhibits normal muscle tone. Gait normal. Coordination normal.  Skin: Skin is warm and dry. No rash noted. Not diaphoretic. No erythema. No pallor.  Psychiatric: Mood, memory and judgment normal.  Vitals reviewed.  LABORATORY DATA: Lab Results  Component Value Date   WBC 4.9 11/16/2018   HGB 13.7 11/16/2018   HCT 39.5 11/16/2018   MCV 112.9 (H) 11/16/2018   PLT 132 (L) 11/16/2018      Chemistry      Component Value Date/Time   NA 139 11/16/2018 0736   NA 136 06/28/2017 1236   K 3.7 11/16/2018 0736   K 4.1 06/28/2017 1236   CL 103 11/16/2018 0736   CO2 22 11/16/2018 0736   CO2 24 06/28/2017 1236   BUN 15 11/16/2018 0736   BUN 17.1 06/28/2017 1236   CREATININE 0.96 11/16/2018 0736   CREATININE 1.0 06/28/2017 1236      Component Value Date/Time   CALCIUM 9.0 11/16/2018 0736   CALCIUM 9.1 06/28/2017 1236    ALKPHOS 62 11/16/2018 0736   ALKPHOS 66 06/28/2017 1236   AST 123 (H) 11/16/2018 0736   AST 40 (H) 06/28/2017 1236   ALT 210 (H) 11/16/2018 0736   ALT 60 (H) 06/28/2017 1236   BILITOT 2.8 (H) 11/16/2018 0736   BILITOT 2.87 (H) 06/28/2017 1236       RADIOGRAPHIC STUDIES:  No results found.   ASSESSMENT/PLAN:  This is a very pleasant 48 year old Caucasian male with paroxysmal nocturnal hemoglobinuria.  He been on treatment with Solaris on days 1 and 15 every 4 weeks.  He is status post 45 cycles.  He received an additional 19 cycles an outside infusion center.  He previously completed a tapering dose of prednisone that was started on September/October 2019 for an episode of hemolytic anemia.  Due to convenience and his previous treatment with Solaris interfering with his work schedule/performance, he is interested in starting Ultomiris.  His insurance requires him to receive treatment from an outside infusion center due to the facility fee here at the cancer center.  He received his first loading dose of treatment on Nov 02, 2018.  He tolerated his first treatment without any adverse effects.   The patient was seen with Dr. Julien Nordmann today.  Labs were reviewed with the patient.  Dr. Julien Nordmann  recommends that he proceed with his first maintenance dose today as scheduled.  He will return in 1 month for labs. I will see him back for follow-up visit in 2 months for evaluation and routine blood work before proceeding with his second maintenance dose.  Regarding the patient's alcohol dependence as evidenced by his LFTs and patient report, the patient is currently trying to cut back.  The patient I discussed possibly transitioning to nonalcoholic beer. The patient is interested in this and will continue to try to cut back and drink nonalcoholic beer.   The patient was advised to call immediately if he has any concerning symptoms in the interval. The patient voices understanding of current disease  status and treatment options and is in agreement with the current care plan. All questions were answered. The patient knows to call the clinic with any problems, questions or concerns. We can certainly see the patient much sooner if necessary  Orders Placed This Encounter  Procedures  . CBC with Differential (Cancer Center Only)    Standing Status:   Future    Standing Expiration Date:   11/16/2019  . CMP ([place] only)    Standing Status:   Future    Standing Expiration Date:   11/16/2019  . Lactate dehydrogenase (LDH)    Standing Status:   Future    Standing Expiration Date:   11/16/2019  . CBC with Differential (Cancer Center Only)    Standing Status:   Standing    Number of Occurrences:   6    Standing Expiration Date:   11/16/2019  . CMP ([place] only)    Standing Status:   Standing    Number of Occurrences:   6    Standing Expiration Date:   11/16/2019  . Lactate dehydrogenase (LDH)    Standing Status:   Standing    Number of Occurrences:   6    Standing Expiration Date:   11/16/2019     Tobe Sos Heilingoetter, [name] 11/16/18  ADDENDUM: Hematology/Oncology Attending: I had a face-to-face encounter with the patient today.  I recommended his care plan.  [name] with history of paroxysmal nocturnal hemoglobinuria (PNH) diagnosed several years ago and was a started on treatment with Solaris but has few hemolytic episodes on this treatment.  Also the schedule was not convenient for him with his work schedule. The patient was a started 2 weeks ago on the first dose of Ultomiris and he tolerated it very well with no concerning complaints. We will start the first dose of maintenance therapy today. The patient will come back for follow-up visit in a few weeks for evaluation before the second dose of chemotherapy.  He will also have blood work in 4 weeks to monitor him closely. The patient was advised to call immediately if he has any  concerning symptoms in the interval. Disclaimer: This note was dictated with voice recognition software. Similar sounding words can inadvertently be transcribed and may be missed upon review. Eilleen Kempf, MD 11/16/18

## 2018-11-29 ENCOUNTER — Telehealth: Payer: Self-pay | Admitting: Internal Medicine

## 2018-11-29 NOTE — Telephone Encounter (Signed)
Called pt per 5/20 los - to confirm appts - no answer - left message with appt date and time

## 2018-12-14 ENCOUNTER — Inpatient Hospital Stay: Payer: BC Managed Care – PPO | Attending: Internal Medicine

## 2018-12-14 ENCOUNTER — Other Ambulatory Visit: Payer: Self-pay

## 2018-12-14 DIAGNOSIS — D595 Paroxysmal nocturnal hemoglobinuria [Marchiafava-Micheli]: Secondary | ICD-10-CM | POA: Insufficient documentation

## 2018-12-14 LAB — CBC WITH DIFFERENTIAL (CANCER CENTER ONLY)
Abs Immature Granulocytes: 0.03 10*3/uL (ref 0.00–0.07)
Basophils Absolute: 0 10*3/uL (ref 0.0–0.1)
Basophils Relative: 1 %
Eosinophils Absolute: 0.2 10*3/uL (ref 0.0–0.5)
Eosinophils Relative: 4 %
HCT: 37.8 % — ABNORMAL LOW (ref 39.0–52.0)
Hemoglobin: 12.9 g/dL — ABNORMAL LOW (ref 13.0–17.0)
Immature Granulocytes: 1 %
Lymphocytes Relative: 25 %
Lymphs Abs: 1.3 10*3/uL (ref 0.7–4.0)
MCH: 40.3 pg — ABNORMAL HIGH (ref 26.0–34.0)
MCHC: 34.1 g/dL (ref 30.0–36.0)
MCV: 118.1 fL — ABNORMAL HIGH (ref 80.0–100.0)
Monocytes Absolute: 0.5 10*3/uL (ref 0.1–1.0)
Monocytes Relative: 10 %
Neutro Abs: 3.1 10*3/uL (ref 1.7–7.7)
Neutrophils Relative %: 59 %
Platelet Count: 147 10*3/uL — ABNORMAL LOW (ref 150–400)
RBC: 3.2 MIL/uL — ABNORMAL LOW (ref 4.22–5.81)
RDW: 14.9 % (ref 11.5–15.5)
WBC Count: 5.2 10*3/uL (ref 4.0–10.5)
nRBC: 0 % (ref 0.0–0.2)

## 2018-12-14 LAB — CMP (CANCER CENTER ONLY)
ALT: 216 U/L — ABNORMAL HIGH (ref 0–44)
AST: 111 U/L — ABNORMAL HIGH (ref 15–41)
Albumin: 4.3 g/dL (ref 3.5–5.0)
Alkaline Phosphatase: 58 U/L (ref 38–126)
Anion gap: 11 (ref 5–15)
BUN: 14 mg/dL (ref 6–20)
CO2: 25 mmol/L (ref 22–32)
Calcium: 9 mg/dL (ref 8.9–10.3)
Chloride: 101 mmol/L (ref 98–111)
Creatinine: 1.01 mg/dL (ref 0.61–1.24)
GFR, Est AFR Am: >60 mL/min (ref >60)
GFR, Estimated: >60 mL/min (ref >60)
Glucose, Bld: 110 mg/dL — ABNORMAL HIGH (ref 70–99)
Potassium: 4.1 mmol/L (ref 3.5–5.1)
Sodium: 137 mmol/L (ref 135–145)
Total Bilirubin: 3.1 mg/dL — ABNORMAL HIGH (ref 0.3–1.2)
Total Protein: 6.8 g/dL (ref 6.5–8.1)

## 2018-12-14 LAB — LACTATE DEHYDROGENASE: LDH: 249 U/L — ABNORMAL HIGH (ref 98–192)

## 2019-01-12 ENCOUNTER — Inpatient Hospital Stay: Payer: BC Managed Care – PPO | Attending: Internal Medicine | Admitting: Internal Medicine

## 2019-01-12 ENCOUNTER — Other Ambulatory Visit: Payer: Self-pay

## 2019-01-12 ENCOUNTER — Encounter: Payer: Self-pay | Admitting: Internal Medicine

## 2019-01-12 ENCOUNTER — Inpatient Hospital Stay: Payer: BC Managed Care – PPO

## 2019-01-12 VITALS — BP 124/75 | HR 66 | Temp 98.0°F | Resp 18 | Ht 66.0 in | Wt 171.9 lb

## 2019-01-12 DIAGNOSIS — I10 Essential (primary) hypertension: Secondary | ICD-10-CM | POA: Diagnosis not present

## 2019-01-12 DIAGNOSIS — D595 Paroxysmal nocturnal hemoglobinuria [Marchiafava-Micheli]: Secondary | ICD-10-CM

## 2019-01-12 DIAGNOSIS — Z7982 Long term (current) use of aspirin: Secondary | ICD-10-CM | POA: Insufficient documentation

## 2019-01-12 DIAGNOSIS — Z79899 Other long term (current) drug therapy: Secondary | ICD-10-CM | POA: Insufficient documentation

## 2019-01-12 LAB — CBC WITH DIFFERENTIAL (CANCER CENTER ONLY)
Abs Immature Granulocytes: 0.01 10*3/uL (ref 0.00–0.07)
Basophils Absolute: 0 10*3/uL (ref 0.0–0.1)
Basophils Relative: 1 %
Eosinophils Absolute: 0.1 10*3/uL (ref 0.0–0.5)
Eosinophils Relative: 2 %
HCT: 38.6 % — ABNORMAL LOW (ref 39.0–52.0)
Hemoglobin: 13.1 g/dL (ref 13.0–17.0)
Immature Granulocytes: 0 %
Lymphocytes Relative: 24 %
Lymphs Abs: 1.1 10*3/uL (ref 0.7–4.0)
MCH: 39.6 pg — ABNORMAL HIGH (ref 26.0–34.0)
MCHC: 33.9 g/dL (ref 30.0–36.0)
MCV: 116.6 fL — ABNORMAL HIGH (ref 80.0–100.0)
Monocytes Absolute: 0.5 10*3/uL (ref 0.1–1.0)
Monocytes Relative: 12 %
Neutro Abs: 2.6 10*3/uL (ref 1.7–7.7)
Neutrophils Relative %: 61 %
Platelet Count: 126 10*3/uL — ABNORMAL LOW (ref 150–400)
RBC: 3.31 MIL/uL — ABNORMAL LOW (ref 4.22–5.81)
RDW: 13.8 % (ref 11.5–15.5)
WBC Count: 4.3 10*3/uL (ref 4.0–10.5)
nRBC: 0 % (ref 0.0–0.2)

## 2019-01-12 LAB — CMP (CANCER CENTER ONLY)
ALT: 105 U/L — ABNORMAL HIGH (ref 0–44)
AST: 70 U/L — ABNORMAL HIGH (ref 15–41)
Albumin: 4.4 g/dL (ref 3.5–5.0)
Alkaline Phosphatase: 55 U/L (ref 38–126)
Anion gap: 12 (ref 5–15)
BUN: 16 mg/dL (ref 6–20)
CO2: 23 mmol/L (ref 22–32)
Calcium: 9 mg/dL (ref 8.9–10.3)
Chloride: 104 mmol/L (ref 98–111)
Creatinine: 0.93 mg/dL (ref 0.61–1.24)
GFR, Est AFR Am: >60 mL/min (ref >60)
GFR, Estimated: >60 mL/min (ref >60)
Glucose, Bld: 97 mg/dL (ref 70–99)
Potassium: 3.9 mmol/L (ref 3.5–5.1)
Sodium: 139 mmol/L (ref 135–145)
Total Bilirubin: 2.7 mg/dL — ABNORMAL HIGH (ref 0.3–1.2)
Total Protein: 7 g/dL (ref 6.5–8.1)

## 2019-01-12 LAB — LACTATE DEHYDROGENASE: LDH: 236 U/L — ABNORMAL HIGH (ref 98–192)

## 2019-01-12 NOTE — Progress Notes (Signed)
[place] [phone]   Fax:([phone]  OFFICE PROGRESS NOTE  [name], MD [address] 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  PRIOR THERAPY:  Soliris (Eculizumab) initially with induction 600 MG IV weekly for 4 weeks and currently on maintenance treatment 900 MG IV every 2 weeks. First dose of treatment was given on 11/21/2014. Status post 45 cycles.  After cycle #45 the patient has been receiving his treatment at outside infusion center.  Status post 16 more cycles of treatment.  CURRENT THERAPY: Ultomiris (Ravulizumab) loading dose 2700 mg/M2 followed by maintenance dose 3300 mg/M2 every 8 weeks.  First dose of treatment September 22, 2018.  He status post the induction dose of the treatment as well as 1 cycle of maintenance therapy.  INTERVAL HISTORY: Travis Mcdaniel 48 y.o. male returns to the clinic today for follow-up visit.  The patient is feeling fine today with no concerning complaints.  He denied having any chest pain, shortness of breath, cough or hemoptysis.  He denied having any fever or chills.  He has no nausea, vomiting, diarrhea or constipation.  He is here today for evaluation and repeat blood work before starting cycle #2 of his maintenance therapy.  MEDICAL HISTORY: Past Medical History:  Diagnosis Date  . Cancer (HCC)    PNH (Bone marrow)  . Diverticulosis   . Encounter for antineoplastic chemotherapy 01/15/2016  . Hypertension     ALLERGIES:  has No Known Allergies.  MEDICATIONS:  Current Outpatient Medications  Medication Sig Dispense Refill  . ALPRAZolam (XANAX) 1 MG tablet Take 1 mg by mouth 3 (three) times daily.  2  . aspirin EC 81 MG tablet Take 81 mg by mouth.    . indomethacin (INDOCIN) 50 MG capsule     . lisinopril-hydrochlorothiazide (PRINZIDE,ZESTORETIC) 20-25 MG tablet Take 1 tablet by mouth daily.     . Multiple Vitamin (MULTIVITAMIN WITH MINERALS) TABS tablet Take 1 tablet by mouth daily.    . Multiple Vitamins-Minerals (MULTIVITAMIN ADULT PO) Take by mouth.    . Psyllium (METAMUCIL PO) Take 1 capsule by mouth daily.    [name] Kitchen tetrahydrozoline 0.05 % ophthalmic solution Place 1 drop into both eyes 2 (two) times daily as needed (eye irritation).    . traMADol (ULTRAM) 50 MG tablet Take by mouth every 6 (six) hours as needed.     No current facility-administered medications for this visit.     SURGICAL HISTORY: No past surgical history on file.  REVIEW OF SYSTEMS:  A comprehensive review of systems was negative.   PHYSICAL EXAMINATION: General appearance: alert, cooperative and no distress Head: Normocephalic, without obvious abnormality, atraumatic Neck: no adenopathy, no JVD, supple, symmetrical, trachea midline and thyroid not enlarged, symmetric, no tenderness/mass/nodules Lymph nodes: Cervical, supraclavicular, and axillary nodes normal. Resp: clear to auscultation bilaterally Back: symmetric, no curvature. ROM normal. No CVA tenderness. Cardio: regular rate and rhythm, S1, S2 normal, no murmur, click, rub or gallop GI: soft, non-tender; bowel sounds normal; no masses,  no organomegaly Extremities: extremities normal, atraumatic, no cyanosis or edema  ECOG PERFORMANCE STATUS: 1 - Symptomatic but completely ambulatory  Blood pressure 124/75, pulse 66, temperature 98 F (36.7 C), temperature source Oral, resp. rate 18, height 5\' 6"  (1.676 m), weight 171 lb 14.4 oz (78 kg), SpO2 98 %.  LABORATORY DATA: Lab Results  Component Value Date   WBC 4.3 01/12/2019  HGB 13.1 01/12/2019   HCT 38.6 (L) 01/12/2019   MCV 116.6 (H) 01/12/2019   PLT 126 (L) 01/12/2019      Chemistry      Component Value Date/Time   NA 137 12/14/2018 0733   NA 136 06/28/2017 1236   K 4.1 12/14/2018 0733   K 4.1 06/28/2017 1236   CL 101 12/14/2018 0733   CO2 25 12/14/2018 0733   CO2 24 06/28/2017 1236    BUN 14 12/14/2018 0733   BUN 17.1 06/28/2017 1236   CREATININE 1.01 12/14/2018 0733   CREATININE 1.0 06/28/2017 1236      Component Value Date/Time   CALCIUM 9.0 12/14/2018 0733   CALCIUM 9.1 06/28/2017 1236   ALKPHOS 58 12/14/2018 0733   ALKPHOS 66 06/28/2017 1236   AST 111 (H) 12/14/2018 0733   AST 40 (H) 06/28/2017 1236   ALT 216 (H) 12/14/2018 0733   ALT 60 (H) 06/28/2017 1236   BILITOT 3.1 (H) 12/14/2018 0733   BILITOT 2.87 (H) 06/28/2017 1236       RADIOGRAPHIC STUDIES: No results found.  ASSESSMENT AND PLAN:  [name] with paroxysmal nocturnal hemoglobinuria and currently on treatment with Soliris on days 1 and 15 every 4 weeks status post 45 cycles.  He is also on the same treatment received at outside infusion center status post 19 more cycles. He also completed a course of tapered dose of prednisone. He is currently on treatment with Ultomiris status post induction dose followed by 1 cycle of maintenance therapy. The patient is tolerating this treatment well with no concerning adverse effects. I recommended for him to proceed with cycle #2 of his maintenance treatment as planned.  He received his treatment at the infusion center in [facility]. I will see him back for follow-up visit in 8 weeks for evaluation before starting cycle #3. He was advised to call immediately if he has any concerning symptoms in the interval. The patient voices understanding of current disease status and treatment options and is in agreement with the current care plan. All questions were answered. The patient knows to call the clinic with any problems, questions or concerns. We can certainly see the patient much sooner if necessary.  Disclaimer: This note was dictated with voice recognition software. Similar sounding words can inadvertently be transcribed and may not be corrected upon review.

## 2019-01-13 ENCOUNTER — Telehealth: Payer: Self-pay | Admitting: Internal Medicine

## 2019-01-13 NOTE — Telephone Encounter (Signed)
Scheduled appt per 7/16 los - unable to reach pt . Left message with appt date and and time and mailed letter

## 2019-01-25 ENCOUNTER — Telehealth: Payer: Self-pay | Admitting: Medical Oncology

## 2019-01-25 NOTE — Telephone Encounter (Signed)
Pt requested Sept appt change to Friday and he wants to get a flu vaccine too. I told pt no appts on Friday and that I will request injection appt.

## 2019-03-09 ENCOUNTER — Telehealth: Payer: Self-pay | Admitting: *Deleted

## 2019-03-09 NOTE — Telephone Encounter (Signed)
Received vm message from [facility] @ [employer] [phone]. She sates patient is requesting to return to [facility] for his treatments of Ultomiris. She states that the [facility] is now 'in network' for him.  She also states he would need a new prior authorization for this treatment if he returns here.  Please advise

## 2019-03-09 NOTE — Telephone Encounter (Signed)
Okay to resume his treatment here. Please forward to pharmacy and managed care for preauthorization.

## 2019-03-15 ENCOUNTER — Inpatient Hospital Stay: Payer: BC Managed Care – PPO | Attending: Internal Medicine | Admitting: Internal Medicine

## 2019-03-15 ENCOUNTER — Other Ambulatory Visit: Payer: Self-pay

## 2019-03-15 ENCOUNTER — Inpatient Hospital Stay: Payer: BC Managed Care – PPO

## 2019-03-15 ENCOUNTER — Encounter: Payer: Self-pay | Admitting: Internal Medicine

## 2019-03-15 VITALS — BP 121/62 | HR 78 | Temp 98.5°F | Resp 18 | Ht 66.0 in | Wt 175.0 lb

## 2019-03-15 DIAGNOSIS — D595 Paroxysmal nocturnal hemoglobinuria [Marchiafava-Micheli]: Secondary | ICD-10-CM

## 2019-03-15 DIAGNOSIS — I10 Essential (primary) hypertension: Secondary | ICD-10-CM | POA: Diagnosis not present

## 2019-03-15 DIAGNOSIS — Z23 Encounter for immunization: Secondary | ICD-10-CM

## 2019-03-15 DIAGNOSIS — Z5111 Encounter for antineoplastic chemotherapy: Secondary | ICD-10-CM | POA: Diagnosis not present

## 2019-03-15 LAB — CBC WITH DIFFERENTIAL (CANCER CENTER ONLY)
Abs Immature Granulocytes: 0.02 10*3/uL (ref 0.00–0.07)
Basophils Absolute: 0 10*3/uL (ref 0.0–0.1)
Basophils Relative: 1 %
Eosinophils Absolute: 0.2 10*3/uL (ref 0.0–0.5)
Eosinophils Relative: 3 %
HCT: 36.5 % — ABNORMAL LOW (ref 39.0–52.0)
Hemoglobin: 12.4 g/dL — ABNORMAL LOW (ref 13.0–17.0)
Immature Granulocytes: 0 %
Lymphocytes Relative: 18 %
Lymphs Abs: 0.9 10*3/uL (ref 0.7–4.0)
MCH: 39.4 pg — ABNORMAL HIGH (ref 26.0–34.0)
MCHC: 34 g/dL (ref 30.0–36.0)
MCV: 115.9 fL — ABNORMAL HIGH (ref 80.0–100.0)
Monocytes Absolute: 0.6 10*3/uL (ref 0.1–1.0)
Monocytes Relative: 11 %
Neutro Abs: 3.6 10*3/uL (ref 1.7–7.7)
Neutrophils Relative %: 67 %
Platelet Count: 143 10*3/uL — ABNORMAL LOW (ref 150–400)
RBC: 3.15 MIL/uL — ABNORMAL LOW (ref 4.22–5.81)
RDW: 14.5 % (ref 11.5–15.5)
WBC Count: 5.3 10*3/uL (ref 4.0–10.5)
nRBC: 0 % (ref 0.0–0.2)

## 2019-03-15 LAB — CMP (CANCER CENTER ONLY)
ALT: 86 U/L — ABNORMAL HIGH (ref 0–44)
AST: 75 U/L — ABNORMAL HIGH (ref 15–41)
Albumin: 4.5 g/dL (ref 3.5–5.0)
Alkaline Phosphatase: 58 U/L (ref 38–126)
Anion gap: 11 (ref 5–15)
BUN: 15 mg/dL (ref 6–20)
CO2: 24 mmol/L (ref 22–32)
Calcium: 9.1 mg/dL (ref 8.9–10.3)
Chloride: 103 mmol/L (ref 98–111)
Creatinine: 0.92 mg/dL (ref 0.61–1.24)
GFR, Est AFR Am: >60 mL/min (ref >60)
GFR, Estimated: >60 mL/min (ref >60)
Glucose, Bld: 97 mg/dL (ref 70–99)
Potassium: 3.8 mmol/L (ref 3.5–5.1)
Sodium: 138 mmol/L (ref 135–145)
Total Bilirubin: 2.9 mg/dL — ABNORMAL HIGH (ref 0.3–1.2)
Total Protein: 6.6 g/dL (ref 6.5–8.1)

## 2019-03-15 LAB — LACTATE DEHYDROGENASE: LDH: 236 U/L — ABNORMAL HIGH (ref 98–192)

## 2019-03-15 MED ORDER — INFLUENZA VAC SPLIT QUAD 0.5 ML IM SUSY
PREFILLED_SYRINGE | INTRAMUSCULAR | Status: AC
  Filled 2019-03-15: qty 0.5

## 2019-03-15 MED ORDER — INFLUENZA VAC SPLIT QUAD 0.5 ML IM SUSY
0.5000 mL | PREFILLED_SYRINGE | Freq: Once | INTRAMUSCULAR | Status: AC
  Administered 2019-03-15: 09:00:00 0.5 mL via INTRAMUSCULAR

## 2019-03-15 NOTE — Patient Instructions (Signed)
Influenza Virus Vaccine injection What is this medicine? INFLUENZA VIRUS VACCINE (in floo EN zuh VAHY ruhs vak SEEN) helps to reduce the risk of getting influenza also known as the flu. The vaccine only helps protect you against some strains of the flu. This medicine may be used for other purposes; ask your health care provider or pharmacist if you have questions. COMMON BRAND NAME(S): Afluria, Afluria Quadrivalent, Agriflu, Alfuria, FLUAD, Fluarix, Fluarix Quadrivalent, Flublok, Flublok Quadrivalent, FLUCELVAX, Flulaval, Fluvirin, Fluzone, Fluzone High-Dose, Fluzone Intradermal What should I tell my health care provider before I take this medicine? They need to know if you have any of these conditions:  bleeding disorder like hemophilia  fever or infection  Guillain-Barre syndrome or other neurological problems  immune system problems  infection with the human immunodeficiency virus (HIV) or AIDS  low blood platelet counts  multiple sclerosis  an unusual or allergic reaction to influenza virus vaccine, latex, other medicines, foods, dyes, or preservatives. Different brands of vaccines contain different allergens. Some may contain latex or eggs. Talk to your doctor about your allergies to make sure that you get the right vaccine.  pregnant or trying to get pregnant  breast-feeding How should I use this medicine? This vaccine is for injection into a muscle or under the skin. It is given by a health care professional. A copy of Vaccine Information Statements will be given before each vaccination. Read this sheet carefully each time. The sheet may change frequently. Talk to your healthcare provider to see which vaccines are right for you. Some vaccines should not be used in all age groups. Overdosage: If you think you have taken too much of this medicine contact a poison control center or emergency room at once. NOTE: This medicine is only for you. Do not share this medicine with  others. What if I miss a dose? This does not apply. What may interact with this medicine?  chemotherapy or radiation therapy  medicines that lower your immune system like etanercept, anakinra, infliximab, and adalimumab  medicines that treat or prevent blood clots like warfarin  phenytoin  steroid medicines like prednisone or cortisone  theophylline  vaccines This list may not describe all possible interactions. Give your health care provider a list of all the medicines, herbs, non-prescription drugs, or dietary supplements you use. Also tell them if you smoke, drink alcohol, or use illegal drugs. Some items may interact with your medicine. What should I watch for while using this medicine? Report any side effects that do not go away within 3 days to your doctor or health care professional. Call your health care provider if any unusual symptoms occur within 6 weeks of receiving this vaccine. You may still catch the flu, but the illness is not usually as bad. You cannot get the flu from the vaccine. The vaccine will not protect against colds or other illnesses that may cause fever. The vaccine is needed every year. What side effects may I notice from receiving this medicine? Side effects that you should report to your doctor or health care professional as soon as possible:  allergic reactions like skin rash, itching or hives, swelling of the face, lips, or tongue Side effects that usually do not require medical attention (report to your doctor or health care professional if they continue or are bothersome):  fever  headache  muscle aches and pains  pain, tenderness, redness, or swelling at the injection site  tiredness This list may not describe all possible side effects. Call your   doctor for medical advice about side effects. You may report side effects to FDA at 1-800-FDA-1088. Where should I keep my medicine? The vaccine will be given by a health care professional in a  clinic, pharmacy, doctor's office, or other health care setting. You will not be given vaccine doses to store at home. NOTE: This sheet is a summary. It may not cover all possible information. If you have questions about this medicine, talk to your doctor, pharmacist, or health care provider.  2020 Elsevier/Gold Standard (2018-05-10 08:45:43)  

## 2019-03-15 NOTE — Progress Notes (Signed)
[facility] [phone]   [phone]  OFFICE PROGRESS NOTE  [name], MD [address]. 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  PRIOR THERAPY:  Soliris (Eculizumab) initially with induction 600 MG IV weekly for 4 weeks and currently on maintenance treatment 900 MG IV every 2 weeks. First dose of treatment was given on 11/21/2014. Status post 45 cycles.  After cycle #45 the patient has been receiving his treatment at outside infusion center.  Status post 16 more cycles of treatment.  CURRENT THERAPY: Ultomiris (Ravulizumab) loading dose 2700 mg/M2 followed by maintenance dose 3300 mg/M2 every 8 weeks.  First dose of treatment September 22, 2018.  He status post the induction dose of the treatment as well as 2 cycle of maintenance therapy.  INTERVAL HISTORY: Travis Mcdaniel 48 y.o. male returns to the clinic today for follow-up visit.  The patient is feeling fine today with no concerning complaints.  He denied having any chest pain, shortness of breath, cough or hemoptysis.  He denied having any fever or chills.  He has no nausea, vomiting, diarrhea or constipation.  He denied having any headache or visual changes.  The patient has no recent weight loss or night sweats.  He has been tolerating his treatment with Ultomiris fairly well.  He received the approval from his insurance company for the Ultomiris to be giving at the cancer center here in [place].  He is expected to start here in 8 weeks.  He is scheduled today for Ultomiris infusion at the infusion center in [place].  MEDICAL HISTORY: Past Medical History:  Diagnosis Date  . Cancer (HCC)    PNH (Bone marrow)  . Diverticulosis   . Encounter for antineoplastic chemotherapy 01/15/2016  . Hypertension     ALLERGIES:  has No Known Allergies.  MEDICATIONS:  Current Outpatient  Medications  Medication Sig Dispense Refill  . ALPRAZolam (XANAX) 1 MG tablet Take 1 mg by mouth 3 (three) times daily.  2  . aspirin EC 81 MG tablet Take 81 mg by mouth.    . indomethacin (INDOCIN) 50 MG capsule     . lisinopril-hydrochlorothiazide (PRINZIDE,ZESTORETIC) 20-25 MG tablet Take 1 tablet by mouth daily.    . Multiple Vitamin (MULTIVITAMIN WITH MINERALS) TABS tablet Take 1 tablet by mouth daily.    . Multiple Vitamins-Minerals (MULTIVITAMIN ADULT PO) Take by mouth.    . Psyllium (METAMUCIL PO) Take 1 capsule by mouth daily.    [name] Kitchen tetrahydrozoline 0.05 % ophthalmic solution Place 1 drop into both eyes 2 (two) times daily as needed (eye irritation).    . traMADol (ULTRAM) 50 MG tablet Take by mouth every 6 (six) hours as needed.     No current facility-administered medications for this visit.     SURGICAL HISTORY: No past surgical history on file.  REVIEW OF SYSTEMS:  A comprehensive review of systems was negative.   PHYSICAL EXAMINATION: General appearance: alert, cooperative and no distress Head: Normocephalic, without obvious abnormality, atraumatic Neck: no adenopathy, no JVD, supple, symmetrical, trachea midline and thyroid not enlarged, symmetric, no tenderness/mass/nodules Lymph nodes: Cervical, supraclavicular, and axillary nodes normal. Resp: clear to auscultation bilaterally Back: symmetric, no curvature. ROM normal. No CVA tenderness. Cardio: regular rate and rhythm, S1, S2 normal, no murmur, click, rub or gallop GI: soft, non-tender; bowel sounds normal; no masses,  no organomegaly Extremities: extremities normal, atraumatic, no cyanosis or edema  ECOG PERFORMANCE STATUS: 1 - Symptomatic but completely ambulatory  Blood pressure 121/62, pulse 78, temperature 98.5 F (36.9 C), temperature source Oral, resp. rate 18, height 5\' 6"  (1.676 m), weight 175 lb (79.4 kg), SpO2 99 %.  LABORATORY DATA: Lab Results  Component Value Date   WBC 5.3 03/15/2019   HGB 12.4  (L) 03/15/2019   HCT 36.5 (L) 03/15/2019   MCV 115.9 (H) 03/15/2019   PLT 143 (L) 03/15/2019      Chemistry      Component Value Date/Time   NA 139 01/12/2019 0808   NA 136 06/28/2017 1236   K 3.9 01/12/2019 0808   K 4.1 06/28/2017 1236   CL 104 01/12/2019 0808   CO2 23 01/12/2019 0808   CO2 24 06/28/2017 1236   BUN 16 01/12/2019 0808   BUN 17.1 06/28/2017 1236   CREATININE 0.93 01/12/2019 0808   CREATININE 1.0 06/28/2017 1236      Component Value Date/Time   CALCIUM 9.0 01/12/2019 0808   CALCIUM 9.1 06/28/2017 1236   ALKPHOS 55 01/12/2019 0808   ALKPHOS 66 06/28/2017 1236   AST 70 (H) 01/12/2019 0808   AST 40 (H) 06/28/2017 1236   ALT 105 (H) 01/12/2019 0808   ALT 60 (H) 06/28/2017 1236   BILITOT 2.7 (H) 01/12/2019 0808   BILITOT 2.87 (H) 06/28/2017 1236       RADIOGRAPHIC STUDIES: No results found.  ASSESSMENT AND PLAN:  [name] with paroxysmal nocturnal hemoglobinuria and currently on treatment with Soliris on days 1 and 15 every 4 weeks status post 45 cycles.  He is also on the same treatment received at outside infusion center status post 19 more cycles. He also completed a course of tapered dose of prednisone. He is currently on treatment with Ultomiris status post induction dose followed by 2 cycles of maintenance therapy. The patient has been tolerating this treatment well with no concerning adverse effects. I recommended for him to proceed with cycle #3 of the maintenance therapy in [place] as planned. Starting from the next cycle the patient will receive his infusion here in [place] and he will come back for follow-up visit in 8 weeks with repeat lab work as well as follow-up visit and infusion. He will receive flu vaccine today. The patient was advised to call immediately if he has any other concerning symptoms in the interval. The patient voices understanding of current disease status and treatment options and  is in agreement with the current care plan. All questions were answered. The patient knows to call the clinic with any problems, questions or concerns. We can certainly see the patient much sooner if necessary.  Disclaimer: This note was dictated with voice recognition software. Similar sounding words can inadvertently be transcribed and may not be corrected upon review.

## 2019-03-17 ENCOUNTER — Telehealth: Payer: Self-pay | Admitting: Internal Medicine

## 2019-03-17 NOTE — Telephone Encounter (Signed)
Scheduled appt per 9/16 los - called pt and he is aware of appt date and time

## 2019-05-10 ENCOUNTER — Inpatient Hospital Stay: Payer: BC Managed Care – PPO

## 2019-05-10 ENCOUNTER — Inpatient Hospital Stay (HOSPITAL_BASED_OUTPATIENT_CLINIC_OR_DEPARTMENT_OTHER): Payer: BC Managed Care – PPO | Admitting: Internal Medicine

## 2019-05-10 ENCOUNTER — Encounter: Payer: Self-pay | Admitting: Internal Medicine

## 2019-05-10 ENCOUNTER — Other Ambulatory Visit: Payer: Self-pay

## 2019-05-10 ENCOUNTER — Inpatient Hospital Stay: Payer: BC Managed Care – PPO | Attending: Internal Medicine

## 2019-05-10 VITALS — BP 130/63 | HR 73 | Temp 98.5°F | Resp 18 | Ht 66.0 in | Wt 175.5 lb

## 2019-05-10 DIAGNOSIS — D595 Paroxysmal nocturnal hemoglobinuria [Marchiafava-Micheli]: Secondary | ICD-10-CM | POA: Insufficient documentation

## 2019-05-10 DIAGNOSIS — D591 Autoimmune hemolytic anemia, unspecified: Secondary | ICD-10-CM | POA: Diagnosis not present

## 2019-05-10 LAB — CMP (CANCER CENTER ONLY)
ALT: 111 U/L — ABNORMAL HIGH (ref 0–44)
AST: 89 U/L — ABNORMAL HIGH (ref 15–41)
Albumin: 4.2 g/dL (ref 3.5–5.0)
Alkaline Phosphatase: 60 U/L (ref 38–126)
Anion gap: 13 (ref 5–15)
BUN: 11 mg/dL (ref 6–20)
CO2: 23 mmol/L (ref 22–32)
Calcium: 9 mg/dL (ref 8.9–10.3)
Chloride: 100 mmol/L (ref 98–111)
Creatinine: 0.9 mg/dL (ref 0.61–1.24)
GFR, Est AFR Am: >60 mL/min (ref >60)
GFR, Estimated: >60 mL/min (ref >60)
Glucose, Bld: 108 mg/dL — ABNORMAL HIGH (ref 70–99)
Potassium: 3.7 mmol/L (ref 3.5–5.1)
Sodium: 136 mmol/L (ref 135–145)
Total Bilirubin: 2.9 mg/dL — ABNORMAL HIGH (ref 0.3–1.2)
Total Protein: 6.8 g/dL (ref 6.5–8.1)

## 2019-05-10 LAB — CBC WITH DIFFERENTIAL (CANCER CENTER ONLY)
Abs Immature Granulocytes: 0.05 10*3/uL (ref 0.00–0.07)
Basophils Absolute: 0 10*3/uL (ref 0.0–0.1)
Basophils Relative: 1 %
Eosinophils Absolute: 0.3 10*3/uL (ref 0.0–0.5)
Eosinophils Relative: 5 %
HCT: 25.7 % — ABNORMAL LOW (ref 39.0–52.0)
Hemoglobin: 8.3 g/dL — ABNORMAL LOW (ref 13.0–17.0)
Immature Granulocytes: 1 %
Lymphocytes Relative: 21 %
Lymphs Abs: 1.2 10*3/uL (ref 0.7–4.0)
MCH: 38.1 pg — ABNORMAL HIGH (ref 26.0–34.0)
MCHC: 32.3 g/dL (ref 30.0–36.0)
MCV: 117.9 fL — ABNORMAL HIGH (ref 80.0–100.0)
Monocytes Absolute: 0.6 10*3/uL (ref 0.1–1.0)
Monocytes Relative: 11 %
Neutro Abs: 3.5 10*3/uL (ref 1.7–7.7)
Neutrophils Relative %: 61 %
Platelet Count: 180 10*3/uL (ref 150–400)
RBC: 2.18 MIL/uL — ABNORMAL LOW (ref 4.22–5.81)
RDW: 17.4 % — ABNORMAL HIGH (ref 11.5–15.5)
WBC Count: 5.6 10*3/uL (ref 4.0–10.5)
nRBC: 3.2 % — ABNORMAL HIGH (ref 0.0–0.2)

## 2019-05-10 LAB — LACTATE DEHYDROGENASE: LDH: 296 U/L — ABNORMAL HIGH (ref 98–192)

## 2019-05-10 MED ORDER — SODIUM CHLORIDE 0.9 % IV SOLN
3300.0000 mg | Freq: Once | Status: AC
  Administered 2019-05-10: 3300 mg via INTRAVENOUS
  Filled 2019-05-10: qty 33

## 2019-05-10 MED ORDER — SODIUM CHLORIDE 0.9 % IV SOLN
Freq: Once | INTRAVENOUS | Status: AC
  Administered 2019-05-10: 09:00:00 via INTRAVENOUS
  Filled 2019-05-10: qty 250

## 2019-05-10 MED ORDER — SODIUM CHLORIDE 0.9 % IV SOLN
3300.0000 mg | Freq: Once | INTRAVENOUS | Status: DC
  Filled 2019-05-10: qty 330

## 2019-05-10 MED ORDER — PREDNISONE 20 MG PO TABS: ORAL_TABLET | ORAL | 0 refills | Status: DC

## 2019-05-10 MED ORDER — NON FORMULARY: 3300.0000 mg | Freq: Once | Status: DC

## 2019-05-10 NOTE — Patient Instructions (Signed)
[facility] Discharge Instructions for Patients Receiving Chemotherapy  Today you received the following chemotherapy agents: Ultomiris (Ravulizumab-cwvz).  To help prevent nausea and vomiting after your treatment, we encourage you to take your nausea medication as directed.   If you develop nausea and vomiting that is not controlled by your nausea medication, call the clinic.   BELOW ARE SYMPTOMS THAT SHOULD BE REPORTED IMMEDIATELY:  *FEVER GREATER THAN 100.5 F  *CHILLS WITH OR WITHOUT FEVER  NAUSEA AND VOMITING THAT IS NOT CONTROLLED WITH YOUR NAUSEA MEDICATION  *UNUSUAL SHORTNESS OF BREATH  *UNUSUAL BRUISING OR BLEEDING  TENDERNESS IN MOUTH AND THROAT WITH OR WITHOUT PRESENCE OF ULCERS  *URINARY PROBLEMS  *BOWEL PROBLEMS  UNUSUAL RASH Items with * indicate a potential emergency and should be followed up as soon as possible.  Feel free to call the clinic should you have any questions or concerns. The clinic phone number is [phone].  Please show the [facility] at check-in to the Emergency Department and triage nurse.  Ravulizumab injection What is this medicine? RAVULIZUMAB (rav ue LIZ ue mab) is a monoclonal antibody. It is used to treat a rare kind of anemia called paroxysmal nocturnal hemoglobinuria or PNH. It may help prevent the loss of blood in patients with PNH. It is also used to treat atypical hemolytic uremic syndrome. This medicine may be used for other purposes; ask your health care provider or pharmacist if you have questions. COMMON BRAND NAME(S): ULTOMIRIS What should I tell my health care provider before I take this medicine? They need to know if you have any of these conditions:  infection  not received meningococcal vaccine  an unusual or allergic reaction to ravulizumab, other medicines, foods, dyes, or preservatives  pregnant or trying to get pregnant  breast-feeding How should I use this medicine? This medicine is  for infusion into a vein. It is given by a health care professional in a hospital or clinic setting. A special MedGuide will be given to you by the pharmacist with each prescription and refill. Be sure to read this information carefully each time. Talk to your pediatrician about the use of this medicine in children. While this drug may be prescribed for children as young as 1 month for selected conditions, precautions do apply. Overdosage: If you think you have taken too much of this medicine contact a poison control center or emergency room at once. NOTE: This medicine is only for you. Do not share this medicine with others. What if I miss a dose? Keep appointments for follow-up doses as directed. It is important not to miss your dose. Call your doctor or healthcare professional if you are unable to keep an appointment. What may interact with this medicine? Interactions are not expected. This list may not describe all possible interactions. Give your health care provider a list of all the medicines, herbs, non-prescription drugs, or dietary supplements you use. Also tell them if you smoke, drink alcohol, or use illegal drugs. Some items may interact with your medicine. What should I watch for while using this medicine? Tell your doctor of healthcare professional if your symptoms do not start to get better or if they get worse. Your condition will be monitored carefully while you are receiving this medicine. You may need blood work done while you are taking this medicine. Call your doctor or health care professional for advice if you get a fever, chills or sore throat, or other symptoms of a cold or flu.  Do not treat yourself. This drug decreases your body's ability to fight infections. Try to avoid being around people who are sick. What side effects may I notice from receiving this medicine? Side effects that you should report to your doctor or health care professional as soon as  possible:  allergic reactions like skin rash, itching or hives, swelling of the face, lips, or tongue  breathing problems  signs and symptoms of infection like fever or chills; cough; sore throat; pain or trouble passing urine  signs and symptoms of low blood pressure like dizziness; feeling faint or lightheaded, falls; unusually weak or tired Side effects that usually do not require medical attention (report these to your doctor or health care professional if they continue or are bothersome):  diarrhea  headache  nausea, vomiting  pain, redness, or irritation at site where injected  stomach pain This list may not describe all possible side effects. Call your doctor for medical advice about side effects. You may report side effects to FDA at 1-800-FDA-1088. Where should I keep my medicine? This medicine is given in a hospital or clinic and will not be stored at home. NOTE: This sheet is a summary. It may not cover all possible information. If you have questions about this medicine, talk to your doctor, pharmacist, or health care provider.  2020 Elsevier/Gold Standard (2018-04-19 11:23:19)

## 2019-05-10 NOTE — Progress Notes (Signed)
[facility] [phone]   Fax:([phone]  OFFICE PROGRESS NOTE  [name], MD [address] 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  PRIOR THERAPY:  Soliris (Eculizumab) initially with induction 600 MG IV weekly for 4 weeks and currently on maintenance treatment 900 MG IV every 2 weeks. First dose of treatment was given on 11/21/2014. Status post 45 cycles.  After cycle #45 the patient has been receiving his treatment at outside infusion center.  Status post 16 more cycles of treatment.  CURRENT THERAPY: Ultomiris (Ravulizumab) loading dose 2700 mg/M2 followed by maintenance dose 3300 mg/M2 every 8 weeks.  First dose of treatment September 22, 2018.  He status post the induction dose of the treatment as well as 3 cycle of maintenance therapy.  The first 3 cycles of his treatment were giving at the infusion center in [facility].  Starting from maintenance cycle #4 the patient will receive his treatment in [place].  INTERVAL HISTORY: Travis Mcdaniel 48 y.o. male returns to the clinic today for follow-up visit.  The patient is feeling fine today with no concerning complaints except for mild fatigue.  He was recently treated for sinus infection with Augmentin.  He denied having any chest pain, shortness of breath, cough or hemoptysis.  He denied having any fever or chills.  He has no nausea, vomiting, diarrhea or constipation.  He has no headache or visual changes.  He is here today for evaluation before starting cycle #4 of his maintenance treatment with Ultomiris.  MEDICAL HISTORY: Past Medical History:  Diagnosis Date  . Cancer (HCC)    PNH (Bone marrow)  . Diverticulosis   . Encounter for antineoplastic chemotherapy 01/15/2016  . Hypertension     ALLERGIES:  has No Known Allergies.  MEDICATIONS:  Current Outpatient Medications   Medication Sig Dispense Refill  . ALPRAZolam (XANAX) 1 MG tablet Take 1 mg by mouth 3 (three) times daily.  2  . aspirin EC 81 MG tablet Take 81 mg by mouth.    . indomethacin (INDOCIN) 50 MG capsule     . lisinopril-hydrochlorothiazide (PRINZIDE,ZESTORETIC) 20-25 MG tablet Take 1 tablet by mouth daily.    . Multiple Vitamin (MULTIVITAMIN WITH MINERALS) TABS tablet Take 1 tablet by mouth daily.    . Multiple Vitamins-Minerals (MULTIVITAMIN ADULT PO) Take by mouth.    . Psyllium (METAMUCIL PO) Take 1 capsule by mouth daily.    [name] Kitchen tetrahydrozoline 0.05 % ophthalmic solution Place 1 drop into both eyes 2 (two) times daily as needed (eye irritation).    . traMADol (ULTRAM) 50 MG tablet Take by mouth every 6 (six) hours as needed.     No current facility-administered medications for this visit.     SURGICAL HISTORY: No past surgical history on file.  REVIEW OF SYSTEMS:  A comprehensive review of systems was negative except for: Constitutional: positive for Pale Eyes: positive for icterus   PHYSICAL EXAMINATION: General appearance: alert, cooperative and no distress Head: Normocephalic, without obvious abnormality, atraumatic Neck: no adenopathy, no JVD, supple, symmetrical, trachea midline and thyroid not enlarged, symmetric, no tenderness/mass/nodules Lymph nodes: Cervical, supraclavicular, and axillary nodes normal. Resp: clear to auscultation bilaterally Back: symmetric, no curvature. ROM normal. No CVA tenderness. Cardio: regular rate and rhythm, S1, S2 normal, no murmur, click, rub or gallop GI: soft, non-tender; bowel sounds normal; no masses,  no organomegaly Extremities: extremities normal, atraumatic,  no cyanosis or edema  ECOG PERFORMANCE STATUS: 1 - Symptomatic but completely ambulatory  Blood pressure 130/63, pulse 73, temperature 98.5 F (36.9 C), temperature source Temporal, resp. rate 18, height 5\' 6"  (1.676 m), weight 175 lb 8 oz (79.6 kg), SpO2 100 %.  LABORATORY DATA:  Lab Results  Component Value Date   WBC 5.6 05/10/2019   HGB 8.3 (L) 05/10/2019   HCT 25.7 (L) 05/10/2019   MCV 117.9 (H) 05/10/2019   PLT 180 05/10/2019      Chemistry      Component Value Date/Time   NA 138 03/15/2019 0750   NA 136 06/28/2017 1236   K 3.8 03/15/2019 0750   K 4.1 06/28/2017 1236   CL 103 03/15/2019 0750   CO2 24 03/15/2019 0750   CO2 24 06/28/2017 1236   BUN 15 03/15/2019 0750   BUN 17.1 06/28/2017 1236   CREATININE 0.92 03/15/2019 0750   CREATININE 1.0 06/28/2017 1236      Component Value Date/Time   CALCIUM 9.1 03/15/2019 0750   CALCIUM 9.1 06/28/2017 1236   ALKPHOS 58 03/15/2019 0750   ALKPHOS 66 06/28/2017 1236   AST 75 (H) 03/15/2019 0750   AST 40 (H) 06/28/2017 1236   ALT 86 (H) 03/15/2019 0750   ALT 60 (H) 06/28/2017 1236   BILITOT 2.9 (H) 03/15/2019 0750   BILITOT 2.87 (H) 06/28/2017 1236       RADIOGRAPHIC STUDIES: No results found.  ASSESSMENT AND PLAN:  [name] with paroxysmal nocturnal hemoglobinuria and currently on treatment with Soliris on days 1 and 15 every 4 weeks status post 45 cycles.  He is also on the same treatment received at outside infusion center status post 19 more cycles. He also completed a course of tapered dose of prednisone. He is currently on treatment with Ultomiris status post induction dose followed by 3 cycles of maintenance therapy. Has been tolerating this treatment well with no concerning adverse effects. I recommended for him to proceed with cycle #4 of his maintenance therapy today as planned. His CBC today showed significant drop in his hemoglobin and hematocrit and the patient may be undergoing some form of hemolysis. I will start him on prednisone 1 mg/kg initially for 1 week and this will be tapered slowly over the next 4-5 weeks. I will repeat his blood work in 2 weeks for evaluation of his hemoglobin and hematocrit as well as his serum bilirubin which is  currently pending. The patient was advised to call immediately if he has any concerning symptoms in the interval. The patient voices understanding of current disease status and treatment options and is in agreement with the current care plan. All questions were answered. The patient knows to call the clinic with any problems, questions or concerns. We can certainly see the patient much sooner if necessary.  Disclaimer: This note was dictated with voice recognition software. Similar sounding words can inadvertently be transcribed and may not be corrected upon review.

## 2019-05-11 ENCOUNTER — Telehealth: Payer: Self-pay

## 2019-05-11 ENCOUNTER — Telehealth: Payer: Self-pay | Admitting: Internal Medicine

## 2019-05-11 NOTE — Telephone Encounter (Signed)
Dawn from [employer] called.  She provided number [phone] for medical/prescription information/resource for Ultomiris.

## 2019-05-11 NOTE — Telephone Encounter (Signed)
Scheduled per los. Called and left mssg. Mailed printout  °

## 2019-05-24 ENCOUNTER — Other Ambulatory Visit: Payer: Self-pay

## 2019-05-24 ENCOUNTER — Inpatient Hospital Stay: Payer: BC Managed Care – PPO

## 2019-05-24 DIAGNOSIS — D591 Autoimmune hemolytic anemia, unspecified: Secondary | ICD-10-CM

## 2019-05-24 DIAGNOSIS — D595 Paroxysmal nocturnal hemoglobinuria [Marchiafava-Micheli]: Secondary | ICD-10-CM | POA: Diagnosis not present

## 2019-05-24 LAB — CBC WITH DIFFERENTIAL (CANCER CENTER ONLY)
Abs Immature Granulocytes: 0.02 10*3/uL (ref 0.00–0.07)
Basophils Absolute: 0 10*3/uL (ref 0.0–0.1)
Basophils Relative: 0 %
Eosinophils Absolute: 0 10*3/uL (ref 0.0–0.5)
Eosinophils Relative: 0 %
HCT: 42.5 % (ref 39.0–52.0)
Hemoglobin: 13.9 g/dL (ref 13.0–17.0)
Immature Granulocytes: 0 %
Lymphocytes Relative: 5 %
Lymphs Abs: 0.3 10*3/uL — ABNORMAL LOW (ref 0.7–4.0)
MCH: 37.5 pg — ABNORMAL HIGH (ref 26.0–34.0)
MCHC: 32.7 g/dL (ref 30.0–36.0)
MCV: 114.6 fL — ABNORMAL HIGH (ref 80.0–100.0)
Monocytes Absolute: 0.1 10*3/uL (ref 0.1–1.0)
Monocytes Relative: 2 %
Neutro Abs: 5.5 10*3/uL (ref 1.7–7.7)
Neutrophils Relative %: 93 %
Platelet Count: 153 10*3/uL (ref 150–400)
RBC: 3.71 MIL/uL — ABNORMAL LOW (ref 4.22–5.81)
RDW: 13.2 % (ref 11.5–15.5)
WBC Count: 5.9 10*3/uL (ref 4.0–10.5)
nRBC: 0 % (ref 0.0–0.2)

## 2019-05-24 LAB — CMP (CANCER CENTER ONLY)
ALT: 59 U/L — ABNORMAL HIGH (ref 0–44)
AST: 38 U/L (ref 15–41)
Albumin: 4.3 g/dL (ref 3.5–5.0)
Alkaline Phosphatase: 53 U/L (ref 38–126)
Anion gap: 12 (ref 5–15)
BUN: 19 mg/dL (ref 6–20)
CO2: 21 mmol/L — ABNORMAL LOW (ref 22–32)
Calcium: 8.9 mg/dL (ref 8.9–10.3)
Chloride: 103 mmol/L (ref 98–111)
Creatinine: 1.02 mg/dL (ref 0.61–1.24)
GFR, Est AFR Am: >60 mL/min (ref >60)
GFR, Estimated: >60 mL/min (ref >60)
Glucose, Bld: 136 mg/dL — ABNORMAL HIGH (ref 70–99)
Potassium: 4.2 mmol/L (ref 3.5–5.1)
Sodium: 136 mmol/L (ref 135–145)
Total Bilirubin: 1.7 mg/dL — ABNORMAL HIGH (ref 0.3–1.2)
Total Protein: 6.7 g/dL (ref 6.5–8.1)

## 2019-05-24 LAB — LACTATE DEHYDROGENASE: LDH: 184 U/L (ref 98–192)

## 2019-06-05 ENCOUNTER — Telehealth: Payer: Self-pay

## 2019-06-05 NOTE — Telephone Encounter (Signed)
Travis Mcdaniel called and said his insurance has changed as of 05/2019.  Information obtained by his employer.  Information sent to [facility] with request for authorization of Ultomiris.

## 2019-06-28 ENCOUNTER — Encounter: Payer: Self-pay | Admitting: Pharmacist

## 2019-07-05 ENCOUNTER — Inpatient Hospital Stay: Payer: 59

## 2019-07-05 ENCOUNTER — Encounter: Payer: Self-pay | Admitting: Internal Medicine

## 2019-07-05 ENCOUNTER — Other Ambulatory Visit: Payer: Self-pay

## 2019-07-05 ENCOUNTER — Inpatient Hospital Stay: Payer: 59 | Attending: Internal Medicine | Admitting: Internal Medicine

## 2019-07-05 VITALS — BP 114/69 | HR 85 | Temp 98.0°F | Resp 17 | Ht 66.0 in | Wt 174.6 lb

## 2019-07-05 DIAGNOSIS — D595 Paroxysmal nocturnal hemoglobinuria [Marchiafava-Micheli]: Secondary | ICD-10-CM

## 2019-07-05 DIAGNOSIS — D696 Thrombocytopenia, unspecified: Secondary | ICD-10-CM | POA: Diagnosis not present

## 2019-07-05 DIAGNOSIS — Z5112 Encounter for antineoplastic immunotherapy: Secondary | ICD-10-CM | POA: Insufficient documentation

## 2019-07-05 DIAGNOSIS — I10 Essential (primary) hypertension: Secondary | ICD-10-CM | POA: Diagnosis not present

## 2019-07-05 LAB — CBC WITH DIFFERENTIAL (CANCER CENTER ONLY)
Abs Immature Granulocytes: 0.02 10*3/uL (ref 0.00–0.07)
Basophils Absolute: 0 10*3/uL (ref 0.0–0.1)
Basophils Relative: 1 %
Eosinophils Absolute: 0.1 10*3/uL (ref 0.0–0.5)
Eosinophils Relative: 2 %
HCT: 32.3 % — ABNORMAL LOW (ref 39.0–52.0)
Hemoglobin: 11.1 g/dL — ABNORMAL LOW (ref 13.0–17.0)
Immature Granulocytes: 0 %
Lymphocytes Relative: 16 %
Lymphs Abs: 0.8 10*3/uL (ref 0.7–4.0)
MCH: 37.5 pg — ABNORMAL HIGH (ref 26.0–34.0)
MCHC: 34.4 g/dL (ref 30.0–36.0)
MCV: 109.1 fL — ABNORMAL HIGH (ref 80.0–100.0)
Monocytes Absolute: 0.5 10*3/uL (ref 0.1–1.0)
Monocytes Relative: 10 %
Neutro Abs: 3.4 10*3/uL (ref 1.7–7.7)
Neutrophils Relative %: 71 %
Platelet Count: 122 10*3/uL — ABNORMAL LOW (ref 150–400)
RBC: 2.96 MIL/uL — ABNORMAL LOW (ref 4.22–5.81)
RDW: 16.5 % — ABNORMAL HIGH (ref 11.5–15.5)
WBC Count: 4.8 10*3/uL (ref 4.0–10.5)
nRBC: 0.4 % — ABNORMAL HIGH (ref 0.0–0.2)

## 2019-07-05 LAB — CMP (CANCER CENTER ONLY)
ALT: 165 U/L — ABNORMAL HIGH (ref 0–44)
AST: 120 U/L — ABNORMAL HIGH (ref 15–41)
Albumin: 4.1 g/dL (ref 3.5–5.0)
Alkaline Phosphatase: 60 U/L (ref 38–126)
Anion gap: 9 (ref 5–15)
BUN: 13 mg/dL (ref 6–20)
CO2: 25 mmol/L (ref 22–32)
Calcium: 8.4 mg/dL — ABNORMAL LOW (ref 8.9–10.3)
Chloride: 102 mmol/L (ref 98–111)
Creatinine: 0.8 mg/dL (ref 0.61–1.24)
GFR, Est AFR Am: >60 mL/min (ref >60)
GFR, Estimated: >60 mL/min (ref >60)
Glucose, Bld: 114 mg/dL — ABNORMAL HIGH (ref 70–99)
Potassium: 3.8 mmol/L (ref 3.5–5.1)
Sodium: 136 mmol/L (ref 135–145)
Total Bilirubin: 2.4 mg/dL — ABNORMAL HIGH (ref 0.3–1.2)
Total Protein: 6.5 g/dL (ref 6.5–8.1)

## 2019-07-05 MED ORDER — SODIUM CHLORIDE 0.9 % IV SOLN: 3300.0000 mg | Freq: Once | INTRAVENOUS | Status: DC

## 2019-07-05 MED ORDER — SODIUM CHLORIDE 0.9 % IV SOLN
Freq: Once | INTRAVENOUS | Status: AC
  Filled 2019-07-05: qty 250

## 2019-07-05 MED ORDER — SODIUM CHLORIDE 0.9 % IV SOLN
3300.0000 mg | Freq: Once | Status: AC
  Administered 2019-07-05: 3300 mg via INTRAVENOUS
  Filled 2019-07-05: qty 33

## 2019-07-05 NOTE — Patient Instructions (Signed)
[facility] Discharge Instructions for Patients Receiving Chemotherapy  Today you received the following chemotherapy agents: Ultomiris (Ravulizumab-cwvz).  To help prevent nausea and vomiting after your treatment, we encourage you to take your nausea medication as directed.   If you develop nausea and vomiting that is not controlled by your nausea medication, call the clinic.   BELOW ARE SYMPTOMS THAT SHOULD BE REPORTED IMMEDIATELY:  *FEVER GREATER THAN 100.5 F  *CHILLS WITH OR WITHOUT FEVER  NAUSEA AND VOMITING THAT IS NOT CONTROLLED WITH YOUR NAUSEA MEDICATION  *UNUSUAL SHORTNESS OF BREATH  *UNUSUAL BRUISING OR BLEEDING  TENDERNESS IN MOUTH AND THROAT WITH OR WITHOUT PRESENCE OF ULCERS  *URINARY PROBLEMS  *BOWEL PROBLEMS  UNUSUAL RASH Items with * indicate a potential emergency and should be followed up as soon as possible.  Feel free to call the clinic should you have any questions or concerns. The clinic phone number is [phone].  Please show the [facility] at check-in to the Emergency Department and triage nurse.  Ravulizumab injection What is this medicine? RAVULIZUMAB (rav ue LIZ ue mab) is a monoclonal antibody. It is used to treat a rare kind of anemia called paroxysmal nocturnal hemoglobinuria or PNH. It may help prevent the loss of blood in patients with PNH. It is also used to treat atypical hemolytic uremic syndrome. This medicine may be used for other purposes; ask your health care provider or pharmacist if you have questions. COMMON BRAND NAME(S): ULTOMIRIS What should I tell my health care provider before I take this medicine? They need to know if you have any of these conditions:  infection  not received meningococcal vaccine  an unusual or allergic reaction to ravulizumab, other medicines, foods, dyes, or preservatives  pregnant or trying to get pregnant  breast-feeding How should I use this medicine? This medicine is  for infusion into a vein. It is given by a health care professional in a hospital or clinic setting. A special MedGuide will be given to you by the pharmacist with each prescription and refill. Be sure to read this information carefully each time. Talk to your pediatrician about the use of this medicine in children. While this drug may be prescribed for children as young as 1 month for selected conditions, precautions do apply. Overdosage: If you think you have taken too much of this medicine contact a poison control center or emergency room at once. NOTE: This medicine is only for you. Do not share this medicine with others. What if I miss a dose? Keep appointments for follow-up doses as directed. It is important not to miss your dose. Call your doctor or healthcare professional if you are unable to keep an appointment. What may interact with this medicine? Interactions are not expected. This list may not describe all possible interactions. Give your health care provider a list of all the medicines, herbs, non-prescription drugs, or dietary supplements you use. Also tell them if you smoke, drink alcohol, or use illegal drugs. Some items may interact with your medicine. What should I watch for while using this medicine? Tell your doctor of healthcare professional if your symptoms do not start to get better or if they get worse. Your condition will be monitored carefully while you are receiving this medicine. You may need blood work done while you are taking this medicine. Call your doctor or health care professional for advice if you get a fever, chills or sore throat, or other symptoms of a cold or flu.  Do not treat yourself. This drug decreases your body's ability to fight infections. Try to avoid being around people who are sick. What side effects may I notice from receiving this medicine? Side effects that you should report to your doctor or health care professional as soon as  possible:  allergic reactions like skin rash, itching or hives, swelling of the face, lips, or tongue  breathing problems  signs and symptoms of infection like fever or chills; cough; sore throat; pain or trouble passing urine  signs and symptoms of low blood pressure like dizziness; feeling faint or lightheaded, falls; unusually weak or tired Side effects that usually do not require medical attention (report these to your doctor or health care professional if they continue or are bothersome):  diarrhea  headache  nausea, vomiting  pain, redness, or irritation at site where injected  stomach pain This list may not describe all possible side effects. Call your doctor for medical advice about side effects. You may report side effects to FDA at 1-800-FDA-1088. Where should I keep my medicine? This medicine is given in a hospital or clinic and will not be stored at home. NOTE: This sheet is a summary. It may not cover all possible information. If you have questions about this medicine, talk to your doctor, pharmacist, or health care provider.  2020 Elsevier/Gold Standard (2018-04-19 11:23:19)

## 2019-07-05 NOTE — Progress Notes (Signed)
Travis Mcdaniel [phone]   Fax:([phone]  OFFICE PROGRESS NOTE  [name], MD [address]. 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  PRIOR THERAPY:  Soliris (Eculizumab) initially with induction 600 MG IV weekly for 4 weeks and currently on maintenance treatment 900 MG IV every 2 weeks. First dose of treatment was given on 11/21/2014. Status post 45 cycles.  After cycle #45 the patient has been receiving his treatment at outside infusion center.  Status post 16 more cycles of treatment.  CURRENT THERAPY: Ultomiris (Ravulizumab) loading dose 2700 mg/M2 followed by maintenance dose 3300 mg/M2 every 8 weeks.  First dose of treatment September 22, 2018.  He status post the induction dose of the treatment as well as 4 cycle of maintenance therapy.  The first 3 cycles of his treatment were giving at the infusion center in [facility].  Starting from maintenance cycle #4 the patient will receive his treatment in [place].  INTERVAL HISTORY: LOT Travis Mcdaniel 49 y.o. male returns to the clinic today for follow-up visit.  The patient is feeling fine today with no concerning complaints except for mild fatigue.  He denied having any current chest pain, shortness of breath, cough or hemoptysis.  He denied having any nausea, vomiting, diarrhea or constipation.  He has no bleeding, bruises or ecchymosis.  He is tolerating his treatment with Ultomiris fairly well.  He is here today for evaluation before starting cycle #5 of his treatment.   MEDICAL HISTORY: Past Medical History:  Diagnosis Date  . Cancer (HCC)    PNH (Bone marrow)  . Diverticulosis   . Encounter for antineoplastic chemotherapy 01/15/2016  . Hypertension     ALLERGIES:  has No Known Allergies.  MEDICATIONS:  Current Outpatient Medications  Medication Sig Dispense Refill  .  ALPRAZolam (XANAX) 1 MG tablet Take 1 mg by mouth 3 (three) times daily.  2  . amoxicillin-clavulanate (AUGMENTIN) 875-125 MG tablet Take 1 tablet by mouth 2 (two) times daily.    [name] Kitchen aspirin EC 81 MG tablet Take 81 mg by mouth.    . indomethacin (INDOCIN) 50 MG capsule     . lisinopril-hydrochlorothiazide (PRINZIDE,ZESTORETIC) 20-25 MG tablet Take 1 tablet by mouth daily.    . Multiple Vitamin (MULTIVITAMIN WITH MINERALS) TABS tablet Take 1 tablet by mouth daily.    . Multiple Vitamins-Minerals (MULTIVITAMIN ADULT PO) Take by mouth.    . predniSONE (DELTASONE) 20 MG tablet 4 tablet p.o. daily for 1 week followed by 3 tablet p.o. daily for 1 week followed by 2 tablet p.o. daily for 1 week followed by 1 tablet p.o. daily for 1 week followed by 0.5 tablet p.o. for 1 week. 75 tablet 0  . Psyllium (METAMUCIL PO) Take 1 capsule by mouth daily.    [name] Kitchen tetrahydrozoline 0.05 % ophthalmic solution Place 1 drop into both eyes 2 (two) times daily as needed (eye irritation).    . traMADol (ULTRAM) 50 MG tablet Take by mouth every 6 (six) hours as needed.     No current facility-administered medications for this visit.    SURGICAL HISTORY: No past surgical history on file.  REVIEW OF SYSTEMS:  A comprehensive review of systems was negative except for: Constitutional: positive for fatigue   PHYSICAL EXAMINATION: General appearance: alert, cooperative and no distress Head: Normocephalic, without obvious abnormality, atraumatic Neck: no adenopathy, no JVD, supple, symmetrical, trachea midline and  thyroid not enlarged, symmetric, no tenderness/mass/nodules Lymph nodes: Cervical, supraclavicular, and axillary nodes normal. Resp: clear to auscultation bilaterally Back: symmetric, no curvature. ROM normal. No CVA tenderness. Cardio: regular rate and rhythm, S1, S2 normal, no murmur, click, rub or gallop GI: soft, non-tender; bowel sounds normal; no masses,  no organomegaly Extremities: extremities normal,  atraumatic, no cyanosis or edema  ECOG PERFORMANCE STATUS: 1 - Symptomatic but completely ambulatory  Blood pressure 114/69, pulse 85, temperature 98 F (36.7 C), temperature source Oral, resp. rate 17, height 5\' 6"  (1.676 m), weight 174 lb 9.6 oz (79.2 kg), SpO2 100 %.  LABORATORY DATA: Lab Results  Component Value Date   WBC 4.8 07/05/2019   HGB 11.1 (L) 07/05/2019   HCT 32.3 (L) 07/05/2019   MCV 109.1 (H) 07/05/2019   PLT 122 (L) 07/05/2019      Chemistry      Component Value Date/Time   NA 136 05/24/2019 1343   NA 136 06/28/2017 1236   K 4.2 05/24/2019 1343   K 4.1 06/28/2017 1236   CL 103 05/24/2019 1343   CO2 21 (L) 05/24/2019 1343   CO2 24 06/28/2017 1236   BUN 19 05/24/2019 1343   BUN 17.1 06/28/2017 1236   CREATININE 1.02 05/24/2019 1343   CREATININE 1.0 06/28/2017 1236      Component Value Date/Time   CALCIUM 8.9 05/24/2019 1343   CALCIUM 9.1 06/28/2017 1236   ALKPHOS 53 05/24/2019 1343   ALKPHOS 66 06/28/2017 1236   AST 38 05/24/2019 1343   AST 40 (H) 06/28/2017 1236   ALT 59 (H) 05/24/2019 1343   ALT 60 (H) 06/28/2017 1236   BILITOT 1.7 (H) 05/24/2019 1343   BILITOT 2.87 (H) 06/28/2017 1236       RADIOGRAPHIC STUDIES: No results found.  ASSESSMENT AND PLAN:  This is a very pleasant 49 years old white male with paroxysmal nocturnal hemoglobinuria and currently on treatment with Soliris on days 1 and 15 every 4 weeks status post 45 cycles.  He is also on the same treatment received at outside infusion center status post 19 more cycles. He also completed a course of tapered dose of prednisone. He is currently on treatment with Ultomiris status post induction dose followed by 4 cycles of maintenance therapy. The patient has been tolerating this treatment well with no concerning complaints except for mild fatigue. I recommended for him to proceed with cycle #5 of his maintenance treatment with Ultomiris today. I will see him back for follow-up visit in 8  weeks for evaluation before the next cycle of his treatment.  He was advised to call immediately if he has any concerning symptoms or fatigue in between the 2 cycles. The patient voices understanding of current disease status and treatment options and is in agreement with the current care plan. All questions were answered. The patient knows to call the clinic with any problems, questions or concerns. We can certainly see the patient much sooner if necessary.  Disclaimer: This note was dictated with voice recognition software. Similar sounding words can inadvertently be transcribed and may not be corrected upon review.

## 2019-07-05 NOTE — Progress Notes (Signed)
Received a called from [facility] in the lab stating patient's LDH hemolyzed. Dr. Julien Nordmann made aware and stated no re-collection is needed. Will draw at next visit. Infusion RN made aware.

## 2019-07-06 ENCOUNTER — Telehealth: Payer: Self-pay | Admitting: Internal Medicine

## 2019-07-06 NOTE — Telephone Encounter (Signed)
Scheduled per los. Called and left msg. Mailed printout  °

## 2019-07-07 ENCOUNTER — Telehealth: Payer: Self-pay | Admitting: *Deleted

## 2019-07-07 MED FILL — Sodium Chloride IV Soln 0.9%: INTRAVENOUS | Qty: 250 | Status: AC

## 2019-07-07 MED FILL — Ravulizumab-cwvz IV Soln 300 MG/3ML (100 MG/ML): INTRAVENOUS | Qty: 33 | Status: AC

## 2019-07-07 NOTE — Telephone Encounter (Signed)
Received call from [facility], Case Manager with [facility] stating that pt states Dr Julien Nordmann is concerned that his infusion medication is not lasting him the full 56 days.  She offered this ph # for Dr Julien Nordmann to call for pharmacy updates/concerns:  [phone].  Informed message would be given to him.  Forwarded message.

## 2019-08-18 ENCOUNTER — Telehealth: Payer: Self-pay | Admitting: *Deleted

## 2019-08-18 NOTE — Telephone Encounter (Signed)
Received call from patient regarding a prior authorization for his Ultomiris.  He states his insurance company [employer]) called him and told him there was a problem with a J1303 code.  He states that once that is corrected , they could do the authorization. He states he is due for his next treatment on 08/30/19.  Message sent to [employer].  Pt aware of this.

## 2019-08-21 ENCOUNTER — Telehealth: Payer: Self-pay

## 2019-08-21 NOTE — Telephone Encounter (Signed)
Received call again about Insurance company denied Ultomiris, see previous note 08/18/19. Patient's next treatment is on 08/30/19

## 2019-08-23 ENCOUNTER — Telehealth: Payer: Self-pay

## 2019-08-23 NOTE — Telephone Encounter (Signed)
Returned call to [employer], LVM that patient will need to continue getting his Ultimaris infusion at the infusion center in [place] as he did in the past.

## 2019-08-24 NOTE — Telephone Encounter (Signed)
Pt notified that he has to get his medication at [facility].

## 2019-08-24 NOTE — Telephone Encounter (Signed)
Called [employer] -Argie Ramming will submit new PA as it has expired.I faxed last note to [facility] . She will fax order for drug adminstration.

## 2019-08-24 NOTE — Telephone Encounter (Signed)
LVN for Dawn -pt cannot receive his medication at [facility] - The issue is the facility fee not J code.

## 2019-08-30 ENCOUNTER — Ambulatory Visit: Payer: 59

## 2019-08-30 ENCOUNTER — Other Ambulatory Visit: Payer: Self-pay

## 2019-08-30 ENCOUNTER — Encounter: Payer: Self-pay | Admitting: Internal Medicine

## 2019-08-30 ENCOUNTER — Inpatient Hospital Stay: Payer: 59

## 2019-08-30 ENCOUNTER — Inpatient Hospital Stay: Payer: 59 | Attending: Internal Medicine | Admitting: Internal Medicine

## 2019-08-30 VITALS — BP 126/76 | HR 81 | Temp 97.6°F | Resp 18 | Ht 66.0 in | Wt 174.5 lb

## 2019-08-30 DIAGNOSIS — Z5111 Encounter for antineoplastic chemotherapy: Secondary | ICD-10-CM

## 2019-08-30 DIAGNOSIS — D598 Other acquired hemolytic anemias: Secondary | ICD-10-CM | POA: Diagnosis not present

## 2019-08-30 DIAGNOSIS — I10 Essential (primary) hypertension: Secondary | ICD-10-CM | POA: Diagnosis not present

## 2019-08-30 DIAGNOSIS — D595 Paroxysmal nocturnal hemoglobinuria [Marchiafava-Micheli]: Secondary | ICD-10-CM

## 2019-08-30 LAB — CMP (CANCER CENTER ONLY)
ALT: 106 U/L — ABNORMAL HIGH (ref 0–44)
AST: 78 U/L — ABNORMAL HIGH (ref 15–41)
Albumin: 4.1 g/dL (ref 3.5–5.0)
Alkaline Phosphatase: 51 U/L (ref 38–126)
Anion gap: 10 (ref 5–15)
BUN: 16 mg/dL (ref 6–20)
CO2: 25 mmol/L (ref 22–32)
Calcium: 8.8 mg/dL — ABNORMAL LOW (ref 8.9–10.3)
Chloride: 104 mmol/L (ref 98–111)
Creatinine: 0.9 mg/dL (ref 0.61–1.24)
GFR, Est AFR Am: >60 mL/min (ref >60)
GFR, Estimated: >60 mL/min (ref >60)
Glucose, Bld: 102 mg/dL — ABNORMAL HIGH (ref 70–99)
Potassium: 3.9 mmol/L (ref 3.5–5.1)
Sodium: 139 mmol/L (ref 135–145)
Total Bilirubin: 2 mg/dL — ABNORMAL HIGH (ref 0.3–1.2)
Total Protein: 6.5 g/dL (ref 6.5–8.1)

## 2019-08-30 LAB — CBC WITH DIFFERENTIAL (CANCER CENTER ONLY)
Abs Immature Granulocytes: 0.02 10*3/uL (ref 0.00–0.07)
Basophils Absolute: 0 10*3/uL (ref 0.0–0.1)
Basophils Relative: 1 %
Eosinophils Absolute: 0.2 10*3/uL (ref 0.0–0.5)
Eosinophils Relative: 3 %
HCT: 33 % — ABNORMAL LOW (ref 39.0–52.0)
Hemoglobin: 11.5 g/dL — ABNORMAL LOW (ref 13.0–17.0)
Immature Granulocytes: 0 %
Lymphocytes Relative: 21 %
Lymphs Abs: 1 10*3/uL (ref 0.7–4.0)
MCH: 39.7 pg — ABNORMAL HIGH (ref 26.0–34.0)
MCHC: 34.8 g/dL (ref 30.0–36.0)
MCV: 113.8 fL — ABNORMAL HIGH (ref 80.0–100.0)
Monocytes Absolute: 0.5 10*3/uL (ref 0.1–1.0)
Monocytes Relative: 10 %
Neutro Abs: 3.1 10*3/uL (ref 1.7–7.7)
Neutrophils Relative %: 65 %
Platelet Count: 137 10*3/uL — ABNORMAL LOW (ref 150–400)
RBC: 2.9 MIL/uL — ABNORMAL LOW (ref 4.22–5.81)
RDW: 14.6 % (ref 11.5–15.5)
WBC Count: 4.7 10*3/uL (ref 4.0–10.5)
nRBC: 0 % (ref 0.0–0.2)

## 2019-08-30 LAB — LACTATE DEHYDROGENASE: LDH: 320 U/L — ABNORMAL HIGH (ref 98–192)

## 2019-08-30 NOTE — Progress Notes (Signed)
[place] [phone]   Fax:([phone]  OFFICE PROGRESS NOTE  [name], MD [address]. 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  PRIOR THERAPY:  Soliris (Eculizumab) initially with induction 600 MG IV weekly for 4 weeks and currently on maintenance treatment 900 MG IV every 2 weeks. First dose of treatment was given on 11/21/2014. Status post 45 cycles.  After cycle #45 the patient has been receiving his treatment at outside infusion center.  Status post 16 more cycles of treatment.  CURRENT THERAPY: Ultomiris (Ravulizumab) loading dose 2700 mg/M2 followed by maintenance dose 3300 mg/M2 every 8 weeks.  First dose of treatment September 22, 2018.  He status post the induction dose of the treatment as well as 4 cycle of maintenance therapy.  The first 3 cycles of his treatment were giving at the infusion center in [facility].  Starting from maintenance cycle #4 the patient will receive his treatment in [place].  INTERVAL HISTORY: Travis Mcdaniel 49 y.o. male returns to the clinic today for follow-up visit.  The patient is feeling fine today with no concerning complaints.  He denied having any current chest pain, shortness of breath, cough or hemoptysis.  He denied having any recent weight loss or night sweats.  He has no nausea, vomiting, diarrhea or constipation.  He has mild fatigue.  He has been tolerating his treatment with Ultomiris fairly well.  Unfortunately because of insurance coverage issues he has to receive his treatment in an outside infusion center starting from this dose.  The patient is here today for evaluation before receiving his treatment.  MEDICAL HISTORY: Past Medical History:  Diagnosis Date  . Cancer (HCC)    PNH (Bone marrow)  . Diverticulosis   . Encounter for antineoplastic chemotherapy 01/15/2016  .  Hypertension     ALLERGIES:  has No Known Allergies.  MEDICATIONS:  Current Outpatient Medications  Medication Sig Dispense Refill  . ALPRAZolam (XANAX) 1 MG tablet Take 1 mg by mouth 3 (three) times daily.  2  . amoxicillin-clavulanate (AUGMENTIN) 875-125 MG tablet Take 1 tablet by mouth 2 (two) times daily.    [name] Kitchen aspirin EC 81 MG tablet Take 81 mg by mouth.    . indomethacin (INDOCIN) 50 MG capsule     . lisinopril-hydrochlorothiazide (PRINZIDE,ZESTORETIC) 20-25 MG tablet Take 1 tablet by mouth daily.    . Multiple Vitamin (MULTIVITAMIN WITH MINERALS) TABS tablet Take 1 tablet by mouth daily.    . Multiple Vitamins-Minerals (MULTIVITAMIN ADULT PO) Take by mouth.    . predniSONE (DELTASONE) 20 MG tablet 4 tablet p.o. daily for 1 week followed by 3 tablet p.o. daily for 1 week followed by 2 tablet p.o. daily for 1 week followed by 1 tablet p.o. daily for 1 week followed by 0.5 tablet p.o. for 1 week. 75 tablet 0  . Psyllium (METAMUCIL PO) Take 1 capsule by mouth daily.    [name] Kitchen tetrahydrozoline 0.05 % ophthalmic solution Place 1 drop into both eyes 2 (two) times daily as needed (eye irritation).    . traMADol (ULTRAM) 50 MG tablet Take by mouth every 6 (six) hours as needed.     No current facility-administered medications for this visit.    SURGICAL HISTORY: No past surgical history on file.  REVIEW OF SYSTEMS:  A comprehensive review of systems was negative except for: Constitutional: positive for fatigue   PHYSICAL EXAMINATION:  General appearance: alert, cooperative, fatigued and no distress Head: Normocephalic, without obvious abnormality, atraumatic Neck: no adenopathy, no JVD, supple, symmetrical, trachea midline and thyroid not enlarged, symmetric, no tenderness/mass/nodules Lymph nodes: Cervical, supraclavicular, and axillary nodes normal. Resp: clear to auscultation bilaterally Back: symmetric, no curvature. ROM normal. No CVA tenderness. Cardio: regular rate and rhythm, S1, S2  normal, no murmur, click, rub or gallop GI: soft, non-tender; bowel sounds normal; no masses,  no organomegaly Extremities: extremities normal, atraumatic, no cyanosis or edema  ECOG PERFORMANCE STATUS: 1 - Symptomatic but completely ambulatory  Blood pressure 126/76, pulse 81, temperature 97.6 F (36.4 C), temperature source Oral, resp. rate 18, height 5\' 6"  (1.676 m), weight 174 lb 8 oz (79.2 kg), SpO2 100 %.  LABORATORY DATA: Lab Results  Component Value Date   WBC 4.7 08/30/2019   HGB 11.5 (L) 08/30/2019   HCT 33.0 (L) 08/30/2019   MCV 113.8 (H) 08/30/2019   PLT 137 (L) 08/30/2019      Chemistry      Component Value Date/Time   NA 136 07/05/2019 0817   NA 136 06/28/2017 1236   K 3.8 07/05/2019 0817   K 4.1 06/28/2017 1236   CL 102 07/05/2019 0817   CO2 25 07/05/2019 0817   CO2 24 06/28/2017 1236   BUN 13 07/05/2019 0817   BUN 17.1 06/28/2017 1236   CREATININE 0.80 07/05/2019 0817   CREATININE 1.0 06/28/2017 1236      Component Value Date/Time   CALCIUM 8.4 (L) 07/05/2019 0817   CALCIUM 9.1 06/28/2017 1236   ALKPHOS 60 07/05/2019 0817   ALKPHOS 66 06/28/2017 1236   AST 120 (H) 07/05/2019 0817   AST 40 (H) 06/28/2017 1236   ALT 165 (H) 07/05/2019 0817   ALT 60 (H) 06/28/2017 1236   BILITOT 2.4 (H) 07/05/2019 0817   BILITOT 2.87 (H) 06/28/2017 1236       RADIOGRAPHIC STUDIES: No results found.  ASSESSMENT AND PLAN:  This is a very pleasant 49 years old white male with paroxysmal nocturnal hemoglobinuria and currently on treatment with Soliris on days 1 and 15 every 4 weeks status post 45 cycles.  He is also on the same treatment received at outside infusion center status post 19 more cycles. He also completed a course of tapered dose of prednisone. He is currently on treatment with Ultomiris status post induction dose followed by 5 cycles of maintenance therapy. He has been tolerating this treatment well with no concerning adverse effects. I recommended for  him to proceed with cycle #6 either today or tomorrow depending on the availability of the drug at the [facility] infusion center. We will see the patient back for follow-up visit in 2 months for evaluation before the next cycle of his treatment. He was advised to call immediately if he has any other concerning symptoms in the interval. The patient voices understanding of current disease status and treatment options and is in agreement with the current care plan. All questions were answered. The patient knows to call the clinic with any problems, questions or concerns. We can certainly see the patient much sooner if necessary.  Disclaimer: This note was dictated with voice recognition software. Similar sounding words can inadvertently be transcribed and may not be corrected upon review.

## 2019-08-30 NOTE — Addendum Note (Signed)
Addended by: Ardeen Garland on: 08/30/2019 08:36 AM   Modules accepted: Orders

## 2019-08-31 ENCOUNTER — Telehealth: Payer: Self-pay | Admitting: Medical Oncology

## 2019-08-31 ENCOUNTER — Telehealth: Payer: Self-pay | Admitting: Internal Medicine

## 2019-08-31 NOTE — Telephone Encounter (Signed)
Called to see if ultomiris is authorized . Per [employer], the pharmacy  is waiting on PA ( it has been submitted).

## 2019-08-31 NOTE — Telephone Encounter (Signed)
Waiting on PA - I told pt to call tomorrow with update on how he feels . He has not been scheduled for his ulltomiris  Steroid Rx--He said he may need steroids to have available if over the weekend he starts feeling bad.

## 2019-08-31 NOTE — Telephone Encounter (Signed)
Scheduled per los. Called and left msg. Mailed printout  °

## 2019-09-01 ENCOUNTER — Telehealth: Payer: Self-pay | Admitting: Medical Oncology

## 2019-09-01 NOTE — Telephone Encounter (Signed)
He has appt for Ultomeris on Monday .   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   He ate now and feels a little better. His voice was strong and he sounded happy that he has an appt Monday .  "Do I  need steroids for the weekend if I  feel worse"?   I told him to call on-call service if his symptoms worsen.  He said he has prednisone at home.   I told him not to take it and call if he has worsening symptoms.

## 2019-09-04 NOTE — Telephone Encounter (Signed)
He does not need any steroids at this point.  His last lab work was acceptable.

## 2019-10-25 ENCOUNTER — Other Ambulatory Visit: Payer: Self-pay

## 2019-10-25 ENCOUNTER — Other Ambulatory Visit: Payer: 59

## 2019-10-25 ENCOUNTER — Ambulatory Visit: Payer: 59 | Admitting: Internal Medicine

## 2019-10-25 ENCOUNTER — Inpatient Hospital Stay: Payer: 59 | Attending: Internal Medicine

## 2019-10-25 ENCOUNTER — Inpatient Hospital Stay: Payer: 59 | Admitting: Internal Medicine

## 2019-10-25 ENCOUNTER — Encounter: Payer: Self-pay | Admitting: Internal Medicine

## 2019-10-25 VITALS — BP 130/64 | HR 77 | Temp 97.8°F | Resp 18 | Wt 175.5 lb

## 2019-10-25 DIAGNOSIS — Z5111 Encounter for antineoplastic chemotherapy: Secondary | ICD-10-CM

## 2019-10-25 DIAGNOSIS — I10 Essential (primary) hypertension: Secondary | ICD-10-CM | POA: Diagnosis not present

## 2019-10-25 DIAGNOSIS — D595 Paroxysmal nocturnal hemoglobinuria [Marchiafava-Micheli]: Secondary | ICD-10-CM

## 2019-10-25 DIAGNOSIS — D696 Thrombocytopenia, unspecified: Secondary | ICD-10-CM | POA: Insufficient documentation

## 2019-10-25 LAB — CBC WITH DIFFERENTIAL (CANCER CENTER ONLY)
Abs Immature Granulocytes: 0.03 10*3/uL (ref 0.00–0.07)
Basophils Absolute: 0.1 10*3/uL (ref 0.0–0.1)
Basophils Relative: 1 %
Eosinophils Absolute: 0.1 10*3/uL (ref 0.0–0.5)
Eosinophils Relative: 3 %
HCT: 37.9 % — ABNORMAL LOW (ref 39.0–52.0)
Hemoglobin: 13.2 g/dL (ref 13.0–17.0)
Immature Granulocytes: 1 %
Lymphocytes Relative: 20 %
Lymphs Abs: 1 10*3/uL (ref 0.7–4.0)
MCH: 40.2 pg — ABNORMAL HIGH (ref 26.0–34.0)
MCHC: 34.8 g/dL (ref 30.0–36.0)
MCV: 115.5 fL — ABNORMAL HIGH (ref 80.0–100.0)
Monocytes Absolute: 0.5 10*3/uL (ref 0.1–1.0)
Monocytes Relative: 9 %
Neutro Abs: 3.2 10*3/uL (ref 1.7–7.7)
Neutrophils Relative %: 66 %
Platelet Count: 139 10*3/uL — ABNORMAL LOW (ref 150–400)
RBC: 3.28 MIL/uL — ABNORMAL LOW (ref 4.22–5.81)
RDW: 14.6 % (ref 11.5–15.5)
WBC Count: 4.8 10*3/uL (ref 4.0–10.5)
nRBC: 0 % (ref 0.0–0.2)

## 2019-10-25 LAB — CMP (CANCER CENTER ONLY)
ALT: 100 U/L — ABNORMAL HIGH (ref 0–44)
AST: 80 U/L — ABNORMAL HIGH (ref 15–41)
Albumin: 4.4 g/dL (ref 3.5–5.0)
Alkaline Phosphatase: 61 U/L (ref 38–126)
Anion gap: 10 (ref 5–15)
BUN: 14 mg/dL (ref 6–20)
CO2: 24 mmol/L (ref 22–32)
Calcium: 9.2 mg/dL (ref 8.9–10.3)
Chloride: 104 mmol/L (ref 98–111)
Creatinine: 0.97 mg/dL (ref 0.61–1.24)
GFR, Est AFR Am: >60 mL/min (ref >60)
GFR, Estimated: >60 mL/min (ref >60)
Glucose, Bld: 110 mg/dL — ABNORMAL HIGH (ref 70–99)
Potassium: 4 mmol/L (ref 3.5–5.1)
Sodium: 138 mmol/L (ref 135–145)
Total Bilirubin: 3.6 mg/dL (ref 0.3–1.2)
Total Protein: 7 g/dL (ref 6.5–8.1)

## 2019-10-25 LAB — LACTATE DEHYDROGENASE: LDH: 237 U/L — ABNORMAL HIGH (ref 98–192)

## 2019-10-25 NOTE — Progress Notes (Signed)
[place] [phone]   [phone]  OFFICE PROGRESS NOTE  [name], MD [address] 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  PRIOR THERAPY:  Soliris (Eculizumab) initially with induction 600 MG IV weekly for 4 weeks and currently on maintenance treatment 900 MG IV every 2 weeks. First dose of treatment was given on 11/21/2014. Status post 45 cycles.  After cycle #45 the patient has been receiving his treatment at outside infusion center.  Status post 16 more cycles of treatment.  CURRENT THERAPY: Ultomiris (Ravulizumab) loading dose 2700 mg/M2 followed by maintenance dose 3300 mg/M2 every 8 weeks.  First dose of treatment September 22, 2018.  He status post the induction dose of the treatment as well as 5 cycle of maintenance therapy.  The first 3 cycles of his treatment were giving at the infusion center in [facility].  Starting from maintenance cycle #4 the patient will receive his treatment in [place].  INTERVAL HISTORY: Travis Mcdaniel 48 y.o. male returns to the clinic today for follow-up visit.  The patient is feeling fine today with no concerning complaints.  He works a lot of hours these days.  He denied having any current chest pain, shortness of breath, cough or hemoptysis.  He denied having any fever or chills.  He has no nausea, vomiting, diarrhea or constipation.  He is currently receiving his treatment with Ultomiris at the infusion center in [place].  He is here today for evaluation and repeat blood work.  MEDICAL HISTORY: Past Medical History:  Diagnosis Date  . Cancer (HCC)    PNH (Bone marrow)  . Diverticulosis   . Encounter for antineoplastic chemotherapy 01/15/2016  . Hypertension     ALLERGIES:  has No Known Allergies.  MEDICATIONS:  Current Outpatient Medications  Medication Sig Dispense Refill   . ALPRAZolam (XANAX) 1 MG tablet Take 1 mg by mouth 3 (three) times daily.  2  . aspirin EC 81 MG tablet Take 81 mg by mouth.    . indomethacin (INDOCIN) 50 MG capsule     . lisinopril-hydrochlorothiazide (PRINZIDE,ZESTORETIC) 20-25 MG tablet Take 1 tablet by mouth daily.    . Multiple Vitamin (MULTIVITAMIN WITH MINERALS) TABS tablet Take 1 tablet by mouth daily.    . Multiple Vitamins-Minerals (MULTIVITAMIN ADULT PO) Take by mouth.    . Psyllium (METAMUCIL PO) Take 1 capsule by mouth daily.    [name] Kitchen tetrahydrozoline 0.05 % ophthalmic solution Place 1 drop into both eyes 2 (two) times daily as needed (eye irritation).    . traMADol (ULTRAM) 50 MG tablet Take by mouth every 6 (six) hours as needed.     No current facility-administered medications for this visit.    SURGICAL HISTORY: No past surgical history on file.  REVIEW OF SYSTEMS:  A comprehensive review of systems was negative.   PHYSICAL EXAMINATION: General appearance: alert, cooperative and no distress Head: Normocephalic, without obvious abnormality, atraumatic Neck: no adenopathy, no JVD, supple, symmetrical, trachea midline and thyroid not enlarged, symmetric, no tenderness/mass/nodules Lymph nodes: Cervical, supraclavicular, and axillary nodes normal. Resp: clear to auscultation bilaterally Back: symmetric, no curvature. ROM normal. No CVA tenderness. Cardio: regular rate and rhythm, S1, S2 normal, no murmur, click, rub or gallop GI: soft, non-tender; bowel sounds normal; no masses,  no organomegaly Extremities: extremities normal, atraumatic, no cyanosis or edema  ECOG PERFORMANCE STATUS: 1 - Symptomatic but completely ambulatory  Blood pressure 130/64, pulse 77, temperature 97.8 F (36.6 C), temperature source Temporal, resp. rate 18, weight 175 lb 8 oz (79.6 kg), SpO2 100 %.  LABORATORY DATA: Lab Results  Component Value Date   WBC 4.8 10/25/2019   HGB 13.2 10/25/2019   HCT 37.9 (L) 10/25/2019   MCV 115.5 (H)  10/25/2019   PLT 139 (L) 10/25/2019      Chemistry      Component Value Date/Time   NA 139 08/30/2019 0749   NA 136 06/28/2017 1236   K 3.9 08/30/2019 0749   K 4.1 06/28/2017 1236   CL 104 08/30/2019 0749   CO2 25 08/30/2019 0749   CO2 24 06/28/2017 1236   BUN 16 08/30/2019 0749   BUN 17.1 06/28/2017 1236   CREATININE 0.90 08/30/2019 0749   CREATININE 1.0 06/28/2017 1236      Component Value Date/Time   CALCIUM 8.8 (L) 08/30/2019 0749   CALCIUM 9.1 06/28/2017 1236   ALKPHOS 51 08/30/2019 0749   ALKPHOS 66 06/28/2017 1236   AST 78 (H) 08/30/2019 0749   AST 40 (H) 06/28/2017 1236   ALT 106 (H) 08/30/2019 0749   ALT 60 (H) 06/28/2017 1236   BILITOT 2.0 (H) 08/30/2019 0749   BILITOT 2.87 (H) 06/28/2017 1236       RADIOGRAPHIC STUDIES: No results found.  ASSESSMENT AND PLAN:  This is a very pleasant 49 years old white male with paroxysmal nocturnal hemoglobinuria and currently on treatment with Soliris on days 1 and 15 every 4 weeks status post 45 cycles.  He is also on the same treatment received at outside infusion center status post 19 more cycles. He also completed a course of tapered dose of prednisone. He is currently on treatment with Ultomiris status post induction dose followed by 6 cycles of maintenance therapy.  The patient is currently receiving his treatment at the infusion center in [place]. CBC today is unremarkable except for mild thrombocytopenia.  Comprehensive metabolic panel and LDH are still pending. I recommended for the patient to proceed with his treatment today as planned. He will come back for follow-up visit in 8 weeks for evaluation before the next cycle of his treatment. The patient was advised to call immediately if he has any concerning symptoms in the interval. The patient voices understanding of current disease status and treatment options and is in agreement with the current care plan. All questions were answered. The patient knows to  call the clinic with any problems, questions or concerns. We can certainly see the patient much sooner if necessary.  Disclaimer: This note was dictated with voice recognition software. Similar sounding words can inadvertently be transcribed and may not be corrected upon review.

## 2019-10-26 ENCOUNTER — Telehealth: Payer: Self-pay | Admitting: Internal Medicine

## 2019-10-26 NOTE — Telephone Encounter (Signed)
Scheduled per los. Called and left msg. Mailed printout  °

## 2019-11-22 ENCOUNTER — Telehealth: Payer: Self-pay | Admitting: Medical Oncology

## 2019-11-22 NOTE — Telephone Encounter (Signed)
New insurance with [place]. He will bring new card tomorrow. He is now working [employer].

## 2019-11-23 ENCOUNTER — Telehealth: Payer: Self-pay | Admitting: Medical Oncology

## 2019-11-23 NOTE — Telephone Encounter (Signed)
New insurance information received.

## 2019-11-24 ENCOUNTER — Telehealth: Payer: Self-pay | Admitting: Medical Oncology

## 2019-11-24 NOTE — Telephone Encounter (Signed)
[place]- On June 1st -his insurance information can be entered in [place] -this is the date his coverage starts.  Facility fee-Pt said [employer] rep told him they will cover the facility fee after his  deductible is paid  . Rohin said [facility]alyn Sago is paying his deductible -we have to send bill to them . He wants his next treatment to be here on June 23rd.  He will cancel at the [facility] facility. Schedule message sent .

## 2019-11-29 ENCOUNTER — Telehealth: Payer: Self-pay | Admitting: Medical Oncology

## 2019-11-29 NOTE — Telephone Encounter (Signed)
Loren at [facility] said the drug has to be Prior [place] . Message to Gaspar Bidding and Demetrius Charity.

## 2019-12-06 ENCOUNTER — Telehealth: Payer: Self-pay | Admitting: Medical Oncology

## 2019-12-06 NOTE — Telephone Encounter (Signed)
LVM that pharmacy is working on PA for [employer].

## 2019-12-12 ENCOUNTER — Telehealth: Payer: Self-pay | Admitting: Medical Oncology

## 2019-12-12 NOTE — Telephone Encounter (Signed)
ILVM that pt needs to keep infusion appt at [facility] next week. His authorization to give it here is pending.

## 2019-12-13 ENCOUNTER — Telehealth: Payer: Self-pay | Admitting: Medical Oncology

## 2019-12-13 NOTE — Telephone Encounter (Signed)
From Jerilee Hoh just received fax from [facility] request has been denied' for ultiomiris to be given here.  I told pt to call [facility] to get the drug there next week.

## 2019-12-15 ENCOUNTER — Telehealth: Payer: Self-pay | Admitting: Medical Oncology

## 2019-12-15 NOTE — Telephone Encounter (Signed)
Faxed records per request.

## 2019-12-20 ENCOUNTER — Inpatient Hospital Stay (HOSPITAL_BASED_OUTPATIENT_CLINIC_OR_DEPARTMENT_OTHER): Payer: BC Managed Care – PPO | Admitting: Internal Medicine

## 2019-12-20 ENCOUNTER — Other Ambulatory Visit: Payer: Self-pay

## 2019-12-20 ENCOUNTER — Inpatient Hospital Stay: Payer: BC Managed Care – PPO

## 2019-12-20 ENCOUNTER — Encounter: Payer: Self-pay | Admitting: Internal Medicine

## 2019-12-20 ENCOUNTER — Inpatient Hospital Stay: Payer: BC Managed Care – PPO | Attending: Internal Medicine

## 2019-12-20 ENCOUNTER — Other Ambulatory Visit: Payer: 59

## 2019-12-20 ENCOUNTER — Ambulatory Visit: Payer: 59 | Admitting: Internal Medicine

## 2019-12-20 VITALS — BP 136/65 | HR 70 | Temp 98.1°F | Resp 20 | Ht 66.0 in | Wt 173.7 lb

## 2019-12-20 DIAGNOSIS — Z5111 Encounter for antineoplastic chemotherapy: Secondary | ICD-10-CM | POA: Diagnosis not present

## 2019-12-20 DIAGNOSIS — I10 Essential (primary) hypertension: Secondary | ICD-10-CM | POA: Diagnosis not present

## 2019-12-20 DIAGNOSIS — D595 Paroxysmal nocturnal hemoglobinuria [Marchiafava-Micheli]: Secondary | ICD-10-CM | POA: Diagnosis not present

## 2019-12-20 DIAGNOSIS — D696 Thrombocytopenia, unspecified: Secondary | ICD-10-CM | POA: Diagnosis not present

## 2019-12-20 LAB — CMP (CANCER CENTER ONLY)
ALT: 191 U/L — ABNORMAL HIGH (ref 0–44)
AST: 139 U/L — ABNORMAL HIGH (ref 15–41)
Albumin: 4.5 g/dL (ref 3.5–5.0)
Alkaline Phosphatase: 68 U/L (ref 38–126)
Anion gap: 12 (ref 5–15)
BUN: 12 mg/dL (ref 6–20)
CO2: 23 mmol/L (ref 22–32)
Calcium: 9.5 mg/dL (ref 8.9–10.3)
Chloride: 104 mmol/L (ref 98–111)
Creatinine: 0.98 mg/dL (ref 0.61–1.24)
GFR, Est AFR Am: >60 mL/min (ref >60)
GFR, Estimated: >60 mL/min (ref >60)
Glucose, Bld: 90 mg/dL (ref 70–99)
Potassium: 4 mmol/L (ref 3.5–5.1)
Sodium: 139 mmol/L (ref 135–145)
Total Bilirubin: 3 mg/dL — ABNORMAL HIGH (ref 0.3–1.2)
Total Protein: 7.4 g/dL (ref 6.5–8.1)

## 2019-12-20 LAB — CBC WITH DIFFERENTIAL (CANCER CENTER ONLY)
Abs Immature Granulocytes: 0.01 10*3/uL (ref 0.00–0.07)
Basophils Absolute: 0 10*3/uL (ref 0.0–0.1)
Basophils Relative: 1 %
Eosinophils Absolute: 0.1 10*3/uL (ref 0.0–0.5)
Eosinophils Relative: 2 %
HCT: 41 % (ref 39.0–52.0)
Hemoglobin: 14.5 g/dL (ref 13.0–17.0)
Immature Granulocytes: 0 %
Lymphocytes Relative: 19 %
Lymphs Abs: 1 10*3/uL (ref 0.7–4.0)
MCH: 41.2 pg — ABNORMAL HIGH (ref 26.0–34.0)
MCHC: 35.4 g/dL (ref 30.0–36.0)
MCV: 116.5 fL — ABNORMAL HIGH (ref 80.0–100.0)
Monocytes Absolute: 0.5 10*3/uL (ref 0.1–1.0)
Monocytes Relative: 10 %
Neutro Abs: 3.5 10*3/uL (ref 1.7–7.7)
Neutrophils Relative %: 68 %
Platelet Count: 139 10*3/uL — ABNORMAL LOW (ref 150–400)
RBC: 3.52 MIL/uL — ABNORMAL LOW (ref 4.22–5.81)
RDW: 13.5 % (ref 11.5–15.5)
WBC Count: 5.2 10*3/uL (ref 4.0–10.5)
nRBC: 0 % (ref 0.0–0.2)

## 2019-12-20 LAB — LACTATE DEHYDROGENASE: LDH: 292 U/L — ABNORMAL HIGH (ref 98–192)

## 2019-12-20 NOTE — Progress Notes (Addendum)
[facility] Telephone:(336) [phone]   Fax:(336) [phone]  OFFICE PROGRESS NOTE  [name], MD [address]. 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  PRIOR THERAPY:  Soliris (Eculizumab) initially with induction 600 MG IV weekly for 4 weeks and then switched to maintenance treatment 900 MG IV every 2 weeks. First dose of treatment was given on 11/21/2014. Status post 45 cycles.  After cycle #45 the patient received his treatment at outside infusion center.  Status post 16 more cycles of treatment.  This was discontinued secondary to worsening symptoms and lack of control of his disease.  CURRENT THERAPY: Ultomiris (Ravulizumab) loading dose 2700 mg/M2 followed by maintenance dose 3300 mg/M2 every 8 weeks.  First dose of treatment September 22, 2018.  He status post the induction dose of the treatment as well as 7 cycle of maintenance therapy.  The first 3 cycles of his treatment were giving at the infusion center in [facility].  Starting from maintenance cycle #4 the patient will receive his treatment in [place].  INTERVAL HISTORY: Travis Mcdaniel 49 y.o. male returns to the clinic today for follow-up visit.  The patient is feeling fine today with no concerning complaints.  He denied having any chest pain, shortness of breath, cough or hemoptysis.  He denied having any fever or chills.  He has no nausea, vomiting, diarrhea or constipation.  He has no headache or visual changes.  He changed jobs recently and also there was a change in his insurance.  He is trying to get the preauthorization for the treatment was Ultomiris to be done at the infusion center in [place] but there was some delay.  He is here today for evaluation and repeat blood work.   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  MEDICATIONS:  Current Outpatient Medications  Medication Sig Dispense Refill   ALPRAZolam (XANAX) 1 MG tablet Take 1 mg by mouth 3 (three) times daily.  2   aspirin EC 81 MG tablet Take 81 mg by mouth.     indomethacin (INDOCIN) 50 MG capsule      lisinopril-hydrochlorothiazide (PRINZIDE,ZESTORETIC) 20-25 MG tablet Take 1 tablet by mouth daily.     Multiple Vitamin (MULTIVITAMIN WITH MINERALS) TABS tablet Take 1 tablet by mouth daily.     Multiple Vitamins-Minerals (MULTIVITAMIN ADULT PO) Take by mouth.     Psyllium (METAMUCIL PO) Take 1 capsule by mouth daily.     tetrahydrozoline 0.05 % ophthalmic solution Place 1 drop into both eyes 2 (two) times daily as needed (eye irritation).     traMADol (ULTRAM) 50 MG tablet Take by mouth every 6 (six) hours as needed.     No current facility-administered medications for this visit.    SURGICAL HISTORY: No past surgical history on file.  REVIEW OF SYSTEMS:  A comprehensive review of systems was negative.   PHYSICAL EXAMINATION: General appearance: alert, cooperative and no distress Head: Normocephalic, without obvious abnormality, atraumatic Neck: no adenopathy, no JVD, supple, symmetrical, trachea midline and thyroid not enlarged, symmetric, no tenderness/mass/nodules Lymph nodes: Cervical, supraclavicular, and axillary nodes normal. Resp: clear to auscultation bilaterally Back: symmetric, no curvature. ROM normal. No CVA tenderness. Cardio: regular rate and  rhythm, S1, S2 normal, no murmur, click, rub or gallop GI: soft, non-tender; bowel sounds normal; no masses,  no organomegaly Extremities: extremities normal, atraumatic, no cyanosis or edema  ECOG PERFORMANCE STATUS: 1 - Symptomatic but completely ambulatory  Blood pressure 136/65, pulse 70, temperature 98.1 F (36.7 C), temperature source  Temporal, resp. rate 20, height 5\' 6"  (1.676 m), weight 173 lb 11.2 oz (78.8 kg), SpO2 100 %.  LABORATORY DATA: Lab Results  Component Value Date   WBC 5.2 12/20/2019   HGB 14.5 12/20/2019   HCT 41.0 12/20/2019   MCV 116.5 (H) 12/20/2019   PLT 139 (L) 12/20/2019      Chemistry      Component Value Date/Time   NA 138 10/25/2019 0736   NA 136 06/28/2017 1236   K 4.0 10/25/2019 0736   K 4.1 06/28/2017 1236   CL 104 10/25/2019 0736   CO2 24 10/25/2019 0736   CO2 24 06/28/2017 1236   BUN 14 10/25/2019 0736   BUN 17.1 06/28/2017 1236   CREATININE 0.97 10/25/2019 0736   CREATININE 1.0 06/28/2017 1236      Component Value Date/Time   CALCIUM 9.2 10/25/2019 0736   CALCIUM 9.1 06/28/2017 1236   ALKPHOS 61 10/25/2019 0736   ALKPHOS 66 06/28/2017 1236   AST 80 (H) 10/25/2019 0736   AST 40 (H) 06/28/2017 1236   ALT 100 (H) 10/25/2019 0736   ALT 60 (H) 06/28/2017 1236   BILITOT 3.6 (HH) 10/25/2019 0736   BILITOT 2.87 (H) 06/28/2017 1236       RADIOGRAPHIC STUDIES: No results found.  ASSESSMENT AND PLAN:  This is a very pleasant 49 years old white male with paroxysmal nocturnal hemoglobinuria and currently on treatment with Soliris on days 1 and 15 every 4 weeks status post 45 cycles.  He is also on the same treatment received at outside infusion center status post 19 more cycles. He also completed a course of tapered dose of prednisone. He is currently on treatment with Ultomiris status post induction dose followed by 7 cycles of maintenance therapy.  The patient is currently receiving his treatment at the infusion center in [place]. The patient continues to tolerate this treatment well with no concerning adverse effects. CBC today is unremarkable except for mild thrombocytopenia. I recommended for the patient to continue his treatment with Ultomiris as planned once the preauthorization is approved by his insurance company. I will see him back for follow-up visit in 8 weeks  for evaluation before the next cycle of his treatment. The patient was advised to call immediately if he has any concerning symptoms in the interval. The patient voices understanding of current disease status and treatment options and is in agreement with the current care plan. All questions were answered. The patient knows to call the clinic with any problems, questions or concerns. We can certainly see the patient much sooner if necessary.  Disclaimer: This note was dictated with voice recognition software. Similar sounding words can inadvertently be transcribed and may not be corrected upon review.

## 2019-12-22 ENCOUNTER — Telehealth: Payer: Self-pay

## 2019-12-22 NOTE — Telephone Encounter (Signed)
Received phone call from Josie Dixon. with [employer] regarding documentation needed for insurance approval for patient's ultomiris infusions. Faxed last office visit note to [phone].

## 2019-12-26 ENCOUNTER — Telehealth: Payer: Self-pay | Admitting: Medical Oncology

## 2019-12-26 ENCOUNTER — Telehealth: Payer: Self-pay | Admitting: Internal Medicine

## 2019-12-26 NOTE — Telephone Encounter (Signed)
Scheduled per los. Called, not able to leave msg. Mailed printout  

## 2019-12-26 NOTE — Telephone Encounter (Signed)
[employer] PA Staff called to say insurance denied ultomiris because they read pt was also getting solaris. I explained that pt only getting ultomiris and office notes supporting this were  faxed last week . They will f/u with insurance.

## 2019-12-28 ENCOUNTER — Other Ambulatory Visit: Payer: Self-pay | Admitting: Medical Oncology

## 2020-01-03 ENCOUNTER — Telehealth: Payer: Self-pay | Admitting: Medical Oncology

## 2020-01-03 NOTE — Telephone Encounter (Signed)
He is asking again for prednisone. He has not received authorization for ultomiris. He states he feels good.I told him Dr Julien Nordmann does not want to send prednisone unless he has symptoms.

## 2020-01-03 NOTE — Telephone Encounter (Signed)
Susie at [facility] in [place] said they are still waiting for PA to administer ultomiris.  I called Audiological scientist and left message for Dawn to please fax the authorization information  , ( I was told she received [employer] PA) to [employer] infusion center at fax  [phone].

## 2020-01-09 ENCOUNTER — Telehealth: Payer: Self-pay | Admitting: Medical Oncology

## 2020-01-09 NOTE — Telephone Encounter (Signed)
Faxed denial letter to [employer] per Lauren's request.

## 2020-01-11 ENCOUNTER — Telehealth: Payer: Self-pay | Admitting: *Deleted

## 2020-01-11 NOTE — Telephone Encounter (Signed)
I will discuss with him when I see him.  Thank you. 

## 2020-01-11 NOTE — Telephone Encounter (Signed)
Paperwork was faxed back to [place] is going to forward it to [employer] to get his ultomiris infusion coordinated.  Patient is asking should he have repeat labs before he starts back since its been several months since he has had an infusion.  He states he is doing great and feels fine otherwise.  States he is taking the prednisone.  Routed to MD to advise.

## 2020-01-11 NOTE — Telephone Encounter (Signed)
Patient called questioning after speaking to drug pharmacy. He states he has been off of the medication for 3 weeks.  The pharmacist is asking if he needs a loading dose and then come back a week later for the second dose.    Routed to MD to advise.  He is scheduled to go Monday to get the infusion.

## 2020-01-11 NOTE — Telephone Encounter (Signed)
Yes we will need to repeat his blood work before resuming the treatment.  Thank you.

## 2020-01-15 ENCOUNTER — Telehealth: Payer: Self-pay | Admitting: Medical Oncology

## 2020-01-15 NOTE — Telephone Encounter (Signed)
Ultomiris authorization to be given at [facility] infusion center  Tax ID [id number]  Need forms completed for initial , continuation of and site of care for Ultimoris administration. I faxed [employer] to fax forms.

## 2020-01-16 ENCOUNTER — Telehealth: Payer: Self-pay | Admitting: Medical Oncology

## 2020-01-16 NOTE — Telephone Encounter (Signed)
Per Dr Julien Nordmann , pt does not need labs and can continue on maintenance dose. Ricky notified at [facility] infusion center.    Authorization number for ultomiris faxed to [employer] infusion center -Jule Ser and Darlena to scan into Epic. Reference # [id number] effective 01/10/20-7/13/22auth number

## 2020-01-29 ENCOUNTER — Telehealth: Payer: Self-pay | Admitting: Medical Oncology

## 2020-01-29 NOTE — Telephone Encounter (Signed)
Feels "anemic. I am pale, out of breath climbing stairs , not eating".

## 2020-01-30 ENCOUNTER — Inpatient Hospital Stay: Payer: BC Managed Care – PPO | Attending: Internal Medicine

## 2020-01-30 ENCOUNTER — Telehealth: Payer: Self-pay | Admitting: Medical Oncology

## 2020-01-30 ENCOUNTER — Other Ambulatory Visit: Payer: Self-pay

## 2020-01-30 ENCOUNTER — Other Ambulatory Visit: Payer: Self-pay | Admitting: Medical Oncology

## 2020-01-30 DIAGNOSIS — D595 Paroxysmal nocturnal hemoglobinuria [Marchiafava-Micheli]: Secondary | ICD-10-CM

## 2020-01-30 LAB — CBC WITH DIFFERENTIAL (CANCER CENTER ONLY)
Abs Immature Granulocytes: 0.05 10*3/uL (ref 0.00–0.07)
Basophils Absolute: 0 10*3/uL (ref 0.0–0.1)
Basophils Relative: 1 %
Eosinophils Absolute: 0.1 10*3/uL (ref 0.0–0.5)
Eosinophils Relative: 1 %
HCT: 33.4 % — ABNORMAL LOW (ref 39.0–52.0)
Hemoglobin: 11.3 g/dL — ABNORMAL LOW (ref 13.0–17.0)
Immature Granulocytes: 1 %
Lymphocytes Relative: 15 %
Lymphs Abs: 1 10*3/uL (ref 0.7–4.0)
MCH: 40.1 pg — ABNORMAL HIGH (ref 26.0–34.0)
MCHC: 33.8 g/dL (ref 30.0–36.0)
MCV: 118.4 fL — ABNORMAL HIGH (ref 80.0–100.0)
Monocytes Absolute: 0.5 10*3/uL (ref 0.1–1.0)
Monocytes Relative: 8 %
Neutro Abs: 5 10*3/uL (ref 1.7–7.7)
Neutrophils Relative %: 74 %
Platelet Count: 155 10*3/uL (ref 150–400)
RBC: 2.82 MIL/uL — ABNORMAL LOW (ref 4.22–5.81)
RDW: 18.3 % — ABNORMAL HIGH (ref 11.5–15.5)
WBC Count: 6.7 10*3/uL (ref 4.0–10.5)
nRBC: 3.3 % — ABNORMAL HIGH (ref 0.0–0.2)

## 2020-01-30 LAB — CMP (CANCER CENTER ONLY)
ALT: 159 U/L — ABNORMAL HIGH (ref 0–44)
AST: 118 U/L — ABNORMAL HIGH (ref 15–41)
Albumin: 4.3 g/dL (ref 3.5–5.0)
Alkaline Phosphatase: 73 U/L (ref 38–126)
Anion gap: 11 (ref 5–15)
BUN: 10 mg/dL (ref 6–20)
CO2: 24 mmol/L (ref 22–32)
Calcium: 10 mg/dL (ref 8.9–10.3)
Chloride: 103 mmol/L (ref 98–111)
Creatinine: 0.94 mg/dL (ref 0.61–1.24)
GFR, Est AFR Am: >60 mL/min (ref >60)
GFR, Estimated: >60 mL/min (ref >60)
Glucose, Bld: 108 mg/dL — ABNORMAL HIGH (ref 70–99)
Potassium: 3.8 mmol/L (ref 3.5–5.1)
Sodium: 138 mmol/L (ref 135–145)
Total Bilirubin: 2.8 mg/dL — ABNORMAL HIGH (ref 0.3–1.2)
Total Protein: 7.2 g/dL (ref 6.5–8.1)

## 2020-01-30 LAB — LACTATE DEHYDROGENASE: LDH: 339 U/L — ABNORMAL HIGH (ref 98–192)

## 2020-01-30 NOTE — Telephone Encounter (Signed)
Received Ultomiris on July 21. His next lab/f/u/ tx is due sept. 15th. Schedule message sent.

## 2020-02-06 ENCOUNTER — Other Ambulatory Visit: Payer: Self-pay | Admitting: Medical Oncology

## 2020-02-12 ENCOUNTER — Telehealth: Payer: Self-pay | Admitting: Medical Oncology

## 2020-02-12 NOTE — Telephone Encounter (Signed)
confirmed next appts for sept appts ultomiris given July 21

## 2020-02-14 ENCOUNTER — Telehealth: Payer: Self-pay | Admitting: Medical Oncology

## 2020-02-14 ENCOUNTER — Other Ambulatory Visit: Payer: BC Managed Care – PPO

## 2020-02-14 ENCOUNTER — Inpatient Hospital Stay: Payer: BC Managed Care – PPO | Admitting: Internal Medicine

## 2020-02-14 NOTE — Telephone Encounter (Signed)
"   can I get the COVID booster?". I told him yes.

## 2020-03-01 ENCOUNTER — Telehealth: Payer: Self-pay | Admitting: *Deleted

## 2020-03-01 ENCOUNTER — Telehealth: Payer: Self-pay | Admitting: Internal Medicine

## 2020-03-01 NOTE — Telephone Encounter (Signed)
Scheduled appointment per 9/3 scheduling message. Patient is aware of appointment update.

## 2020-03-01 NOTE — Telephone Encounter (Signed)
Pt reports having trouble navigating websites to schedule Covid booster & wants to get it when he is here next instead of flu vax.  Informed that this should be OK.  Scheduling message sent for Covid booster.

## 2020-03-13 ENCOUNTER — Other Ambulatory Visit: Payer: Self-pay

## 2020-03-13 ENCOUNTER — Inpatient Hospital Stay: Payer: BC Managed Care – PPO

## 2020-03-13 ENCOUNTER — Encounter: Payer: Self-pay | Admitting: *Deleted

## 2020-03-13 ENCOUNTER — Inpatient Hospital Stay: Payer: BC Managed Care – PPO | Attending: Internal Medicine | Admitting: Internal Medicine

## 2020-03-13 ENCOUNTER — Encounter: Payer: Self-pay | Admitting: Internal Medicine

## 2020-03-13 DIAGNOSIS — Z23 Encounter for immunization: Secondary | ICD-10-CM | POA: Insufficient documentation

## 2020-03-13 DIAGNOSIS — Z5111 Encounter for antineoplastic chemotherapy: Secondary | ICD-10-CM

## 2020-03-13 DIAGNOSIS — I10 Essential (primary) hypertension: Secondary | ICD-10-CM | POA: Diagnosis not present

## 2020-03-13 DIAGNOSIS — D595 Paroxysmal nocturnal hemoglobinuria [Marchiafava-Micheli]: Secondary | ICD-10-CM | POA: Insufficient documentation

## 2020-03-13 DIAGNOSIS — Z79899 Other long term (current) drug therapy: Secondary | ICD-10-CM | POA: Diagnosis not present

## 2020-03-13 DIAGNOSIS — D696 Thrombocytopenia, unspecified: Secondary | ICD-10-CM | POA: Insufficient documentation

## 2020-03-13 LAB — CMP (CANCER CENTER ONLY)
ALT: 252 U/L — ABNORMAL HIGH (ref 0–44)
AST: 209 U/L (ref 15–41)
Albumin: 4.3 g/dL (ref 3.5–5.0)
Alkaline Phosphatase: 73 U/L (ref 38–126)
Anion gap: 10 (ref 5–15)
BUN: 11 mg/dL (ref 6–20)
CO2: 23 mmol/L (ref 22–32)
Calcium: 9.3 mg/dL (ref 8.9–10.3)
Chloride: 104 mmol/L (ref 98–111)
Creatinine: 0.85 mg/dL (ref 0.61–1.24)
GFR, Est AFR Am: >60 mL/min (ref >60)
GFR, Estimated: >60 mL/min (ref >60)
Glucose, Bld: 97 mg/dL (ref 70–99)
Potassium: 4 mmol/L (ref 3.5–5.1)
Sodium: 137 mmol/L (ref 135–145)
Total Bilirubin: 2.6 mg/dL — ABNORMAL HIGH (ref 0.3–1.2)
Total Protein: 7.2 g/dL (ref 6.5–8.1)

## 2020-03-13 LAB — CBC WITH DIFFERENTIAL (CANCER CENTER ONLY)
Abs Immature Granulocytes: 0.01 10*3/uL (ref 0.00–0.07)
Basophils Absolute: 0 10*3/uL (ref 0.0–0.1)
Basophils Relative: 1 %
Eosinophils Absolute: 0.2 10*3/uL (ref 0.0–0.5)
Eosinophils Relative: 4 %
HCT: 36.4 % — ABNORMAL LOW (ref 39.0–52.0)
Hemoglobin: 13.1 g/dL (ref 13.0–17.0)
Immature Granulocytes: 0 %
Lymphocytes Relative: 21 %
Lymphs Abs: 1.1 10*3/uL (ref 0.7–4.0)
MCH: 40.8 pg — ABNORMAL HIGH (ref 26.0–34.0)
MCHC: 36 g/dL (ref 30.0–36.0)
MCV: 113.4 fL — ABNORMAL HIGH (ref 80.0–100.0)
Monocytes Absolute: 0.6 10*3/uL (ref 0.1–1.0)
Monocytes Relative: 11 %
Neutro Abs: 3.3 10*3/uL (ref 1.7–7.7)
Neutrophils Relative %: 63 %
Platelet Count: 138 10*3/uL — ABNORMAL LOW (ref 150–400)
RBC: 3.21 MIL/uL — ABNORMAL LOW (ref 4.22–5.81)
RDW: 14 % (ref 11.5–15.5)
WBC Count: 5.2 10*3/uL (ref 4.0–10.5)
nRBC: 0 % (ref 0.0–0.2)

## 2020-03-13 LAB — LACTATE DEHYDROGENASE: LDH: 325 U/L — ABNORMAL HIGH (ref 98–192)

## 2020-03-13 NOTE — Progress Notes (Signed)
CRITICAL VALUE STICKER  CRITICAL VALUE: AST [id number]  RECEIVER (on-site recipient of call):Demarrius Guerrero,RN  DATE & TIME NOTIFIED: 03/13/20 @ 0855  MESSENGER (representative from lab): Pam  MD NOTIFIED: Dr. Julien Nordmann  TIME OF NOTIFICATION: 0689  RESPONSE: Seeing patient today.

## 2020-03-13 NOTE — Progress Notes (Signed)
   Covid-19 Vaccination Clinic  Name:  Travis Mcdaniel    MRN: [id number] DOB: Apr 21, 1971  03/13/2020  Mr. Travis Mcdaniel was observed post Covid-19 immunization for 15 minutes without incident. He was provided with Vaccine Information Sheet and instruction to access the V-Safe system.   Mr. Travis Mcdaniel was instructed to call 911 with any severe reactions post vaccine: [name] Kitchen Difficulty breathing  . Swelling of face and throat  . A fast heartbeat  . A bad rash all over body  . Dizziness and weakness

## 2020-03-13 NOTE — Progress Notes (Signed)
[place] Telephone:([phone]   Fax:([phone]  OFFICE PROGRESS NOTE  [name], MD [address] 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  PRIOR THERAPY:  Soliris (Eculizumab) initially with induction 600 MG IV weekly for 4 weeks and then switched to maintenance treatment 900 MG IV every 2 weeks. First dose of treatment was given on 11/21/2014. Status post 45 cycles.  After cycle #45 the patient received his treatment at outside infusion center.  Status post 16 more cycles of treatment.  This was discontinued secondary to worsening symptoms and lack of control of his disease.  CURRENT THERAPY: Ultomiris (Ravulizumab) loading dose 2700 mg/M2 followed by maintenance dose 3300 mg/M2 every 8 weeks.  First dose of treatment September 22, 2018.  He status post the induction dose of the treatment as well as 7 cycle of maintenance therapy.  The first 3 cycles of his treatment were giving at the infusion center in [facility]  Starting from maintenance cycle #4 the patient will receive his treatment in [place].  INTERVAL HISTORY: [name] 49 y.o. male returns to the clinic today for follow-up visit.  The patient resumed his treatment with Ultomiris 2 months ago.  He tolerated the last cycle of his treatment fairly well.  He denied having any chest pain, shortness of breath, cough or hemoptysis.  He denied having any fatigue or weakness.  He has no nausea, vomiting, diarrhea or constipation.  He has no headache or visual changes.  He has no bleeding, bruises or ecchymosis.  He has a rough time with his insurance coverage for his visit at [facility] health with a facility fee as well as the lab test and he has to pay around $500 every time he comes for the visit.  He is looking for other options at this point.  The patient is here today for evaluation and  repeat blood work.   MEDICAL HISTORY: Past Medical History:  Diagnosis Date   Cancer ([place])    PNH (Bone marrow)   Diverticulosis    Encounter for antineoplastic chemotherapy 01/15/2016   Hypertension     ALLERGIES:  has No Known Allergies.  MEDICATIONS:  Current Outpatient Medications  Medication Sig Dispense Refill   ALPRAZolam (XANAX) 1 MG tablet Take 1 mg by mouth 3 (three) times daily.  2   aspirin EC 81 MG tablet Take 81 mg by mouth.     lisinopril-hydrochlorothiazide (PRINZIDE,ZESTORETIC) 20-25 MG tablet Take 1 tablet by mouth daily.     Multiple Vitamins-Minerals (MULTIVITAMIN ADULT PO) Take by mouth.     OVER THE COUNTER MEDICATION Take 1 tablet by mouth daily. OTC potassium     Psyllium (METAMUCIL PO) Take 1 capsule by mouth daily.     traMADol (ULTRAM) 50 MG tablet Take by mouth every 6 (six) hours as needed.     indomethacin (INDOCIN) 50 MG capsule  (Patient not taking: Reported on 03/13/2020)     tetrahydrozoline 0.05 % ophthalmic solution Place 1 drop into both eyes 2 (two) times daily as needed (eye irritation). (Patient not taking: Reported on 03/13/2020)     No current facility-administered medications for this visit.    SURGICAL HISTORY: No past surgical history on file.  REVIEW OF SYSTEMS:  A comprehensive review of systems was negative except for: Constitutional: positive for fatigue   PHYSICAL EXAMINATION: General appearance: alert, cooperative and no distress Head: Normocephalic,  without obvious abnormality, atraumatic Neck: no adenopathy, no JVD, supple, symmetrical, trachea midline and thyroid not enlarged, symmetric, no tenderness/mass/nodules Lymph nodes: Cervical, supraclavicular, and axillary nodes normal. Resp: clear to auscultation bilaterally Back: symmetric, no curvature. ROM normal. No CVA tenderness. Cardio: regular rate and rhythm, S1, S2 normal, no murmur, click, rub or gallop GI: soft, non-tender; bowel sounds normal; no masses,   no organomegaly Extremities: extremities normal, atraumatic, no cyanosis or edema  ECOG PERFORMANCE STATUS: 1 - Symptomatic but completely ambulatory  There were no vitals taken for this visit.  LABORATORY DATA: Lab Results  Component Value Date   WBC 5.2 03/13/2020   HGB 13.1 03/13/2020   HCT 36.4 (L) 03/13/2020   MCV 113.4 (H) 03/13/2020   PLT 138 (L) 03/13/2020      Chemistry      Component Value Date/Time   NA 138 01/30/2020 0836   NA 136 06/28/2017 1236   K 3.8 01/30/2020 0836   K 4.1 06/28/2017 1236   CL 103 01/30/2020 0836   CO2 24 01/30/2020 0836   CO2 24 06/28/2017 1236   BUN 10 01/30/2020 0836   BUN 17.1 06/28/2017 1236   CREATININE 0.94 01/30/2020 0836   CREATININE 1.0 06/28/2017 1236      Component Value Date/Time   CALCIUM 10.0 01/30/2020 0836   CALCIUM 9.1 06/28/2017 1236   ALKPHOS 73 01/30/2020 0836   ALKPHOS 66 06/28/2017 1236   AST 118 (H) 01/30/2020 0836   AST 40 (H) 06/28/2017 1236   ALT 159 (H) 01/30/2020 0836   ALT 60 (H) 06/28/2017 1236   BILITOT 2.8 (H) 01/30/2020 0836   BILITOT 2.87 (H) 06/28/2017 1236       RADIOGRAPHIC STUDIES: No results found.  ASSESSMENT AND PLAN:  This is a very pleasant 49 years old white male with paroxysmal nocturnal hemoglobinuria and currently on treatment with Soliris on days 1 and 15 every 4 weeks status post 45 cycles.  He is also on the same treatment received at outside infusion center status post 19 more cycles. He also completed a course of tapered dose of prednisone. He is currently on treatment with Ultomiris status post induction dose followed by 8 cycles of maintenance therapy.  He is currently receiving his infusion of the Ultomiris at the [place] infusion center. His CBC today is unremarkable except for mild thrombocytopenia.  Comprehensive metabolic panel and LDH are still pending. I recommended for the patient to proceed with his treatment today as planned. He will come back for follow-up  visit in 8 weeks for evaluation before the next cycle of his treatment unless the patient finds another better option financially with his current insurance coverage. He will receive his Covid booster shot today. He was advised to call immediately if he has any other concerning symptoms in the interval. The patient voices understanding of current disease status and treatment options and is in agreement with the current care plan. All questions were answered. The patient knows to call the clinic with any problems, questions or concerns. We can certainly see the patient much sooner if necessary.  Disclaimer: This note was dictated with voice recognition software. Similar sounding words can inadvertently be transcribed and may not be corrected upon review.

## 2020-03-14 ENCOUNTER — Telehealth: Payer: Self-pay | Admitting: Internal Medicine

## 2020-03-14 NOTE — Telephone Encounter (Signed)
Scheduled per los. Called and left msg. Mailed printout  °

## 2020-05-07 ENCOUNTER — Ambulatory Visit: Payer: Self-pay

## 2020-05-07 ENCOUNTER — Telehealth: Payer: Self-pay | Admitting: Orthopedic Surgery

## 2020-05-07 ENCOUNTER — Encounter: Payer: Self-pay | Admitting: Orthopedic Surgery

## 2020-05-07 ENCOUNTER — Ambulatory Visit (INDEPENDENT_AMBULATORY_CARE_PROVIDER_SITE_OTHER): Payer: No Typology Code available for payment source | Admitting: Physician Assistant

## 2020-05-07 DIAGNOSIS — M25571 Pain in right ankle and joints of right foot: Secondary | ICD-10-CM

## 2020-05-07 MED ORDER — MELOXICAM 7.5 MG PO TABS: 7.5000 mg | ORAL_TABLET | Freq: Every day | ORAL | 1 refills | Status: DC

## 2020-05-07 MED ORDER — OXYCODONE HCL 5 MG PO TABS: 5.0000 mg | ORAL_TABLET | ORAL | 0 refills | Status: DC | PRN

## 2020-05-07 NOTE — Telephone Encounter (Signed)
E 

## 2020-05-07 NOTE — Progress Notes (Signed)
Office Visit Note   Patient: Travis Mcdaniel           Date of Birth: 1971-04-07           MRN: [id number] Visit Date: 05/07/2020              Requested by: [name], MD [place],  [place] [address] PCP: [name], MD  Chief Complaint  Patient presents with  . Right Ankle - Pain      HPI: Patient is a 49 year old gentleman who presents today with a chief complaint of right ankle pain.  He has a history of a nonoperative fracture approximately 5 years ago.  He occasionally twisted his ankle and recreates pain and swelling in the ankle joint.  Apparently he stepped into a hole several days ago.  He now has pain mostly over the lateral ankle joint.  He states his difficult for him to sleep at night because the pain is so significant.  Also hurts when he moves his ankle up up and down.  Denies any foot pain.  Assessment & Plan: Visit Diagnoses:  1. 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  I would like to try an injection today but he declines this.  We will immobilize him in a cam walker boot.  He would like to try an anti-inflammatory and I have prescribed Mobic for him.  I have also given him a small amount of oxycodone.  He cannot take regular Percocet because he has a blood disorder that he is not able to take Tylenol.  He may weight-bear as tolerated.  Follow-up in 2 weeks.  Also is also suggested Voltaren gel  Follow-Up Instructions: No follow-ups on file.   Ortho Exam  Patient is alert, oriented, no adenopathy, well-dressed, normal affect, normal respiratory effort. Right ankle no effusion strong distal pulses no cellulitis.  He is acutely tender over the lateral ankle joint.  Pain with range of motion.  He also has some posterior impingement pain.  Ankle range of motion is quite stiff.  Imaging: No results found. No images are attached to the encounter.  Labs: Lab  Results  Component Value Date   REPTSTATUS 09/07/2014 FINAL 09/06/2014   CULT NO GROWTH Performed at [facility]  09/06/2014     Lab Results  Component Value Date   ALBUMIN 4.3 03/13/2020   ALBUMIN 4.3 01/30/2020   ALBUMIN 4.5 12/20/2019    No results found for: MG No results found for: VD25OH  No results found for: PREALBUMIN CBC EXTENDED Latest Ref Rng & Units 03/13/2020 01/30/2020 12/20/2019  WBC 4.0 - 10.5 K/uL 5.2 6.7 5.2  RBC 4.22 - 5.81 MIL/uL 3.21(L) 2.82(L) 3.52(L)  HGB 13.0 - 17.0 g/dL 13.1 11.3(L) 14.5  HCT 39 - 52 % 36.4(L) 33.4(L) 41.0  PLT 150 - 400 K/uL 138(L) 155 139(L)  NEUTROABS 1.7 - 7.7 K/uL 3.3 5.0 3.5  LYMPHSABS 0.7 - 4.0 K/uL 1.1 1.0 1.0     There is no height or weight on file to calculate BMI.  Orders:  Orders Placed This Encounter  Procedures  . XR Ankle Complete Right   Meds ordered this encounter  Medications  . meloxicam (MOBIC) 7.5 MG tablet    Sig: Take 1 tablet (7.5 mg total) by mouth daily.    Dispense:  30 tablet    Refill:  1  . oxyCODONE (  OXY IR/ROXICODONE) 5 MG immediate release tablet    Sig: Take 1 tablet (5 mg total) by mouth every 4 (four) hours as needed for severe pain.    Dispense:  10 tablet    Refill:  0     Procedures: No procedures performed  Clinical Data: No additional findings.  ROS:  All other systems negative, except as noted in the HPI. Review of Systems  Objective: Vital Signs: There were no vitals taken for this visit.  Specialty Comments:  No specialty comments available.  PMFS History: Patient Active Problem List   Diagnosis Date Noted  . PNH (paroxysmal nocturnal hemoglobinuria) ([place]) 03/18/2018  . Hemolytic anemia ([place]) 03/18/2018  . Sinusitis 09/09/2016  . Encounter for antineoplastic chemotherapy 01/15/2016  . Paroxysmal nocturnal hemoglobinuria (PNH) ([place]) 11/12/2014  . Abscess 10/24/2014  . Hypertension 10/24/2014  . Hyperbilirubinemia 10/24/2014  . Anxiety 10/18/2014  .  Thrombocytopenia ([place]) 10/12/2014  . Influenza A (H1N1) 09/06/2014  . Acute kidney injury ([place]) 09/05/2014  . UTI (lower urinary tract infection) 09/05/2014  . Alcohol dependence ([place]) 09/05/2014  . Diarrhea 09/05/2014  . Absolute anemia    Past Medical History:  Diagnosis Date  . Cancer ([facility])    PNH (Bone marrow)  . Diverticulosis   . Encounter for antineoplastic chemotherapy 01/15/2016  . Hypertension     Family History  Problem Relation Age of Onset  . Alcohol abuse Brother   . Cirrhosis Brother        deceased    No past surgical history on file. Social History   Occupational History  . Not on file  Tobacco Use  . Smoking status: Never Smoker  . Smokeless tobacco: Never Used  Vaping Use  . Vaping Use: Never used  Substance and Sexual Activity  . Alcohol use: Yes    Comment: [place]s/ [employer] weekend  . Drug use: No  . Sexual activity: Not on file

## 2020-05-08 ENCOUNTER — Other Ambulatory Visit: Payer: BC Managed Care – PPO

## 2020-05-08 ENCOUNTER — Ambulatory Visit: Payer: BC Managed Care – PPO

## 2020-05-08 ENCOUNTER — Ambulatory Visit: Payer: BC Managed Care – PPO | Admitting: Internal Medicine

## 2020-05-08 ENCOUNTER — Telehealth: Payer: Self-pay | Admitting: Medical Oncology

## 2020-05-08 NOTE — Telephone Encounter (Signed)
Reports he has inflammation from his arthritis . Diagnosed  by ORTHO .  Can he take Mobic po and voltaren gel?  I told him he can take the prescribed meds from [facility].

## 2020-05-09 ENCOUNTER — Other Ambulatory Visit: Payer: Self-pay

## 2020-05-09 ENCOUNTER — Inpatient Hospital Stay: Payer: BC Managed Care – PPO

## 2020-05-09 ENCOUNTER — Telehealth: Payer: Self-pay

## 2020-05-09 ENCOUNTER — Inpatient Hospital Stay: Payer: BC Managed Care – PPO | Attending: Internal Medicine | Admitting: Internal Medicine

## 2020-05-09 ENCOUNTER — Encounter: Payer: Self-pay | Admitting: Internal Medicine

## 2020-05-09 VITALS — BP 115/65 | HR 112 | Temp 99.5°F | Resp 17 | Ht 66.0 in | Wt 176.7 lb

## 2020-05-09 DIAGNOSIS — D595 Paroxysmal nocturnal hemoglobinuria [Marchiafava-Micheli]: Secondary | ICD-10-CM

## 2020-05-09 DIAGNOSIS — Z23 Encounter for immunization: Secondary | ICD-10-CM

## 2020-05-09 LAB — CMP (CANCER CENTER ONLY)
ALT: 104 U/L — ABNORMAL HIGH (ref 0–44)
AST: 83 U/L — ABNORMAL HIGH (ref 15–41)
Albumin: 4 g/dL (ref 3.5–5.0)
Alkaline Phosphatase: 65 U/L (ref 38–126)
Anion gap: 11 (ref 5–15)
BUN: 23 mg/dL — ABNORMAL HIGH (ref 6–20)
CO2: 23 mmol/L (ref 22–32)
Calcium: 9.3 mg/dL (ref 8.9–10.3)
Chloride: 100 mmol/L (ref 98–111)
Creatinine: 0.97 mg/dL (ref 0.61–1.24)
GFR, Estimated: >60 mL/min (ref >60)
Glucose, Bld: 110 mg/dL — ABNORMAL HIGH (ref 70–99)
Potassium: 3.9 mmol/L (ref 3.5–5.1)
Sodium: 134 mmol/L — ABNORMAL LOW (ref 135–145)
Total Bilirubin: 3.9 mg/dL (ref 0.3–1.2)
Total Protein: 7.3 g/dL (ref 6.5–8.1)

## 2020-05-09 LAB — CBC WITH DIFFERENTIAL (CANCER CENTER ONLY)
Abs Immature Granulocytes: 0.04 10*3/uL (ref 0.00–0.07)
Basophils Absolute: 0 10*3/uL (ref 0.0–0.1)
Basophils Relative: 0 %
Eosinophils Absolute: 0.1 10*3/uL (ref 0.0–0.5)
Eosinophils Relative: 1 %
HCT: 27.3 % — ABNORMAL LOW (ref 39.0–52.0)
Hemoglobin: 9.4 g/dL — ABNORMAL LOW (ref 13.0–17.0)
Immature Granulocytes: 1 %
Lymphocytes Relative: 13 %
Lymphs Abs: 1 10*3/uL (ref 0.7–4.0)
MCH: 38.5 pg — ABNORMAL HIGH (ref 26.0–34.0)
MCHC: 34.4 g/dL (ref 30.0–36.0)
MCV: 111.9 fL — ABNORMAL HIGH (ref 80.0–100.0)
Monocytes Absolute: 1 10*3/uL (ref 0.1–1.0)
Monocytes Relative: 12 %
Neutro Abs: 5.8 10*3/uL (ref 1.7–7.7)
Neutrophils Relative %: 73 %
Platelet Count: 164 10*3/uL (ref 150–400)
RBC: 2.44 MIL/uL — ABNORMAL LOW (ref 4.22–5.81)
RDW: 13.4 % (ref 11.5–15.5)
WBC Count: 7.9 10*3/uL (ref 4.0–10.5)
nRBC: 0 % (ref 0.0–0.2)

## 2020-05-09 LAB — LACTATE DEHYDROGENASE: LDH: 411 U/L — ABNORMAL HIGH (ref 98–192)

## 2020-05-09 MED ORDER — INFLUENZA VAC SPLIT QUAD 0.5 ML IM SUSY
PREFILLED_SYRINGE | INTRAMUSCULAR | Status: AC
  Filled 2020-05-09: qty 0.5

## 2020-05-09 MED ORDER — PREDNISONE 10 MG PO TABS: ORAL_TABLET | ORAL | 0 refills | Status: DC

## 2020-05-09 NOTE — Progress Notes (Signed)
[place] Telephone:([phone]) [phone]   Fax:(336) [phone]  OFFICE PROGRESS NOTE  [name], MD [address] 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  PRIOR THERAPY:  Soliris (Eculizumab) initially with induction 600 MG IV weekly for 4 weeks and then switched to maintenance treatment 900 MG IV every 2 weeks. First dose of treatment was given on 11/21/2014. Status post 45 cycles.  After cycle #45 the patient received his treatment at outside infusion center.  Status post 16 more cycles of treatment.  This was discontinued secondary to worsening symptoms and lack of control of his disease.  CURRENT THERAPY: Ultomiris (Ravulizumab) loading dose 2700 mg/M2 followed by maintenance dose 3300 mg/M2 every 8 weeks.  First dose of treatment September 22, 2018.  He status post the induction dose of the treatment as well as 8 cycle of maintenance therapy.  The first 3 cycles of his treatment were giving at the infusion center in [facility].  Starting from maintenance cycle #4 the patient will receive his treatment in [place].  INTERVAL HISTORY: Travis Mcdaniel 49 y.o. male returns to the clinic today for follow-up visit.  The patient is feeling fine except for new stress fracture of his right ankle.  He denied having any chest pain, shortness of breath, cough or hemoptysis.  He continues to complain of fatigue.  He denied having any nausea, vomiting, diarrhea or constipation.  He continues to tolerate his treatment with Ultomiris fairly well.  He was seen at [facility] by Dr. Gita Kudo but they were unable to provide him with the infusion because of the same facility fee that he has to be at [facility].  He will continue to receive his infusion at the infusion center in [place].  He is here today for evaluation and repeat blood work.   MEDICAL HISTORY: Past Medical  History:  Diagnosis Date  . Cancer (HCC)    PNH (Bone marrow)  . Diverticulosis   . Encounter for antineoplastic chemotherapy 01/15/2016  . Hypertension     ALLERGIES:  has No Known Allergies.  MEDICATIONS:  Current Outpatient Medications  Medication Sig Dispense Refill  . ALPRAZolam (XANAX) 1 MG tablet Take 1 mg by mouth 3 (three) times daily.  2  . aspirin EC 81 MG tablet Take 81 mg by mouth.    [name] Kitchen lisinopril-hydrochlorothiazide (PRINZIDE,ZESTORETIC) 20-25 MG tablet Take 1 tablet by mouth daily.    . meloxicam (MOBIC) 7.5 MG tablet Take 1 tablet (7.5 mg total) by mouth daily. 30 tablet 1  . Multiple Vitamins-Minerals (MULTIVITAMIN ADULT PO) Take by mouth.    [name] Kitchen OVER THE COUNTER MEDICATION Take 1 tablet by mouth daily. OTC potassium    . oxyCODONE (OXY IR/ROXICODONE) 5 MG immediate release tablet Take 1 tablet (5 mg total) by mouth every 4 (four) hours as needed for severe pain. 10 tablet 0  . Psyllium (METAMUCIL PO) Take 1 capsule by mouth daily.    [name] Kitchen tetrahydrozoline 0.05 % ophthalmic solution Place 1 drop into both eyes 2 (two) times daily as needed (eye irritation). (Patient not taking: Reported on 03/13/2020)     No current facility-administered medications for this visit.    SURGICAL HISTORY: No past surgical history on file.  REVIEW OF SYSTEMS:  A comprehensive review of systems was negative except for: Constitutional: positive for fatigue   PHYSICAL EXAMINATION: General appearance: alert, cooperative and no distress Head: Normocephalic,  without obvious abnormality, atraumatic Neck: no adenopathy, no JVD, supple, symmetrical, trachea midline and thyroid not enlarged, symmetric, no tenderness/mass/nodules Lymph nodes: Cervical, supraclavicular, and axillary nodes normal. Resp: clear to auscultation bilaterally Back: symmetric, no curvature. ROM normal. No CVA tenderness. Cardio: regular rate and rhythm, S1, S2 normal, no murmur, click, rub or gallop GI: soft, non-tender;  bowel sounds normal; no masses,  no organomegaly Extremities: extremities normal, atraumatic, no cyanosis or edema  ECOG PERFORMANCE STATUS: 1 - Symptomatic but completely ambulatory  Blood pressure 115/65, pulse (!) 112, temperature 99.5 F (37.5 C), temperature source Tympanic, resp. rate 17, height 5\' 6"  (1.676 m), weight 176 lb 11.2 oz (80.2 kg), SpO2 100 %.  LABORATORY DATA: Lab Results  Component Value Date   WBC 7.9 05/09/2020   HGB 9.4 (L) 05/09/2020   HCT 27.3 (L) 05/09/2020   MCV 111.9 (H) 05/09/2020   PLT 164 05/09/2020      Chemistry      Component Value Date/Time   NA 137 03/13/2020 0747   NA 136 06/28/2017 1236   K 4.0 03/13/2020 0747   K 4.1 06/28/2017 1236   CL 104 03/13/2020 0747   CO2 23 03/13/2020 0747   CO2 24 06/28/2017 1236   BUN 11 03/13/2020 0747   BUN 17.1 06/28/2017 1236   CREATININE 0.85 03/13/2020 0747   CREATININE 1.0 06/28/2017 1236      Component Value Date/Time   CALCIUM 9.3 03/13/2020 0747   CALCIUM 9.1 06/28/2017 1236   ALKPHOS 73 03/13/2020 0747   ALKPHOS 66 06/28/2017 1236   AST 209 (HH) 03/13/2020 0747   AST 40 (H) 06/28/2017 1236   ALT 252 (H) 03/13/2020 0747   ALT 60 (H) 06/28/2017 1236   BILITOT 2.6 (H) 03/13/2020 0747   BILITOT 2.87 (H) 06/28/2017 1236       RADIOGRAPHIC STUDIES: XR Ankle Complete Right  Result Date: 05/07/2020 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  No acute osseous changes   ASSESSMENT AND PLAN:  This is a very pleasant 49 years old white male with paroxysmal nocturnal hemoglobinuria and currently on treatment with Soliris on days 1 and 15 every 4 weeks status post 45 cycles.  He is also on the same treatment received at outside infusion center status post 19 more cycles. He also completed a course of tapered dose of prednisone. He is currently on treatment with Ultomiris status post induction dose followed by 9 cycles of maintenance therapy.  He is currently receiving his infusion  of the Ultomiris at the [place] infusion center. The patient continues to tolerate his treatment with Ultomiris fairly well. Repeat CBC today showed worsening anemia.  I will start the patient on prednisone 20 mg p.o. daily for 1 week followed by 10 mg p.o. daily for 1 week to help with any potential hemolysis at this point. He will continue his current treatment with Ultomiris. Dr. Gita Kudo suggested for the patient to receive anticoagulation but he did not give him any prescription or gave a service recommendation regarding anticoagulation.  He is currently on aspirin daily. I may consider the patient for treatment with Eliquis of Xarelto if he can afford it. I will check his lab again in 2 weeks. He will come back for follow-up visit in 2 months for evaluation and repeat blood work before the next infusion. The patient voices understanding of current disease status and treatment options and is in agreement with the current care plan. All questions were answered. The patient knows to  call the clinic with any problems, questions or concerns. We can certainly see the patient much sooner if necessary.  Disclaimer: This note was dictated with voice recognition software. Similar sounding words can inadvertently be transcribed and may not be corrected upon review.

## 2020-05-09 NOTE — Telephone Encounter (Signed)
CRITICAL VALUE STICKER  CRITICAL VALUE: Total Bili = 3.9  RECEIVER (on-site recipient of call): Yetta Glassman, [place] NOTIFIED: 05/09/20 at 8:34am  MESSENGER (representative from lab): Ulice Dash  MD NOTIFIED: Dr. Julien Nordmann  TIME OF NOTIFICATION: 8:40am  RESPONSE: Dr. Julien Nordmann indicated he has started th ept on a course of steroids.

## 2020-05-13 ENCOUNTER — Encounter: Payer: Self-pay | Admitting: Physician Assistant

## 2020-05-13 ENCOUNTER — Ambulatory Visit: Payer: BC Managed Care – PPO | Admitting: Physician Assistant

## 2020-05-13 DIAGNOSIS — M25571 Pain in right ankle and joints of right foot: Secondary | ICD-10-CM | POA: Diagnosis not present

## 2020-05-13 NOTE — Progress Notes (Signed)
Office Visit Note   Patient: Travis Mcdaniel           Date of Birth: 01-Apr-1971           MRN: [id number] Visit Date: 05/13/2020              Requested by: [name], MD [place],  [place] [id number] PCP: [name], MD  Chief Complaint  Patient presents with  . Right Ankle - Pain      HPI: This is a pleasant 49 year old gentleman who follows up today for his right ankle pain.  He has been taking Mobic once daily and using his cam walker boot.  He feels much better than a week ago.  He would like to go back to work on Wednesday .  His hematologist is aware that he is taking Mobic and is on his chart. Assessment & Plan: Visit Diagnoses: No diagnosis found.  Plan: Patient may follow-up as needed.  He may wind up out of a boot to a work shoe.  He understands the stiffer the shoe and over the ankle would be much better for him.  He will be released to light duties on Wednesday and full duties a week later  Follow-Up Instructions: No follow-ups on file.   Ortho Exam  Patient is alert, oriented, no adenopathy, well-dressed, normal affect, normal respiratory effort. Right ankle painless range of motion no swelling no tenderness with the exception of very small amount of tenderness to deep palpation over the subfibular space.  Good plantar flexion eversion inversion and dorsiflexion strength.  Imaging: No results found. No images are attached to the encounter.  Labs: Lab Results  Component Value Date   REPTSTATUS 09/07/2014 FINAL 09/06/2014   CULT NO GROWTH Performed at [facility]  09/06/2014     Lab Results  Component Value Date   ALBUMIN 4.0 05/09/2020   ALBUMIN 4.3 03/13/2020   ALBUMIN 4.3 01/30/2020    No results found for: MG No results found for: VD25OH  No results found for: PREALBUMIN CBC EXTENDED Latest Ref Rng & Units 05/09/2020 03/13/2020 01/30/2020  WBC 4.0 - 10.5 K/uL 7.9 5.2 6.7  RBC 4.22 - 5.81 MIL/uL 2.44(L)  3.21(L) 2.82(L)  HGB 13.0 - 17.0 g/dL 9.4(L) 13.1 11.3(L)  HCT 39 - 52 % 27.3(L) 36.4(L) 33.4(L)  PLT 150 - 400 K/uL 164 138(L) 155  NEUTROABS 1.7 - 7.7 K/uL 5.8 3.3 5.0  LYMPHSABS 0.7 - 4.0 K/uL 1.0 1.1 1.0     There is no height or weight on file to calculate BMI.  Orders:  No orders of the defined types were placed in this encounter.  No orders of the defined types were placed in this encounter.    Procedures: No procedures performed  Clinical Data: No additional findings.  ROS:  All other systems negative, except as noted in the HPI. Review of Systems  Objective: Vital Signs: There were no vitals taken for this visit.  Specialty Comments:  No specialty comments available.  PMFS History: Patient Active Problem List   Diagnosis Date Noted  . PNH (paroxysmal nocturnal hemoglobinuria) ([place]) 03/18/2018  . Hemolytic anemia ([place]) 03/18/2018  . Sinusitis 09/09/2016  . Encounter for antineoplastic chemotherapy 01/15/2016  . Paroxysmal nocturnal hemoglobinuria (PNH) ([place]) 11/12/2014  . Abscess 10/24/2014  . Hypertension 10/24/2014  . Hyperbilirubinemia 10/24/2014  . Anxiety 10/18/2014  . Thrombocytopenia ([place]) 10/12/2014  . Influenza A (H1N1) 09/06/2014  . Acute kidney injury ([place]) 09/05/2014  .  UTI (lower urinary tract infection) 09/05/2014  . Alcohol dependence ([place]) 09/05/2014  . Diarrhea 09/05/2014  . Absolute anemia    Past Medical History:  Diagnosis Date  . Cancer (HCC)    PNH (Bone marrow)  . Diverticulosis   . Encounter for antineoplastic chemotherapy 01/15/2016  . Hypertension     Family History  Problem Relation Age of Onset  . Alcohol abuse Brother   . Cirrhosis Brother        deceased    History reviewed. No pertinent surgical history. Social History   Occupational History  . Not on file  Tobacco Use  . Smoking status: Never Smoker  . Smokeless tobacco: Never Used  Vaping Use  . Vaping Use: Never used  Substance and Sexual Activity    . Alcohol use: Yes    Comment: odouls/ budlight weekend  . Drug use: No  . Sexual activity: Not on file

## 2020-05-14 ENCOUNTER — Telehealth: Payer: Self-pay

## 2020-05-14 NOTE — Telephone Encounter (Signed)
Pt Travis Mcdaniel regarding his return appointment for an OV and labs. Pt states he is suppose to return in 56 days.  Dr. Julien Nordmann, can you clarify your LOS for a 2 month return OV) and repeat lab orders for 05/22/20 please?

## 2020-05-15 NOTE — Telephone Encounter (Signed)
He needs repeat labs sooner because of the results seen last visit.  He can continue to keep the other appointment in 8 weeks but we will need to check his lab sooner to avoid any worsening of his condition.

## 2020-05-16 ENCOUNTER — Telehealth: Payer: Self-pay | Admitting: Internal Medicine

## 2020-05-16 NOTE — Telephone Encounter (Signed)
Scheduled appt per 1/18 sch msg - unable to reach pt - left message for patient with appt date and time

## 2020-05-20 ENCOUNTER — Other Ambulatory Visit: Payer: Self-pay

## 2020-05-20 ENCOUNTER — Inpatient Hospital Stay: Payer: BC Managed Care – PPO

## 2020-05-20 DIAGNOSIS — Z23 Encounter for immunization: Secondary | ICD-10-CM

## 2020-05-20 DIAGNOSIS — D595 Paroxysmal nocturnal hemoglobinuria [Marchiafava-Micheli]: Secondary | ICD-10-CM | POA: Diagnosis not present

## 2020-05-20 LAB — CMP (CANCER CENTER ONLY)
ALT: 88 U/L — ABNORMAL HIGH (ref 0–44)
AST: 33 U/L (ref 15–41)
Albumin: 4 g/dL (ref 3.5–5.0)
Alkaline Phosphatase: 57 U/L (ref 38–126)
Anion gap: 10 (ref 5–15)
BUN: 17 mg/dL (ref 6–20)
CO2: 21 mmol/L — ABNORMAL LOW (ref 22–32)
Calcium: 8.8 mg/dL — ABNORMAL LOW (ref 8.9–10.3)
Chloride: 104 mmol/L (ref 98–111)
Creatinine: 0.94 mg/dL (ref 0.61–1.24)
GFR, Estimated: >60 mL/min (ref >60)
Glucose, Bld: 88 mg/dL (ref 70–99)
Potassium: 3.8 mmol/L (ref 3.5–5.1)
Sodium: 135 mmol/L (ref 135–145)
Total Bilirubin: 2.3 mg/dL — ABNORMAL HIGH (ref 0.3–1.2)
Total Protein: 6.4 g/dL — ABNORMAL LOW (ref 6.5–8.1)

## 2020-05-20 LAB — CBC WITH DIFFERENTIAL (CANCER CENTER ONLY)
Abs Immature Granulocytes: 0.04 10*3/uL (ref 0.00–0.07)
Basophils Absolute: 0 10*3/uL (ref 0.0–0.1)
Basophils Relative: 1 %
Eosinophils Absolute: 0.1 10*3/uL (ref 0.0–0.5)
Eosinophils Relative: 1 %
HCT: 36.5 % — ABNORMAL LOW (ref 39.0–52.0)
Hemoglobin: 11.5 g/dL — ABNORMAL LOW (ref 13.0–17.0)
Immature Granulocytes: 1 %
Lymphocytes Relative: 16 %
Lymphs Abs: 1 10*3/uL (ref 0.7–4.0)
MCH: 37.6 pg — ABNORMAL HIGH (ref 26.0–34.0)
MCHC: 31.5 g/dL (ref 30.0–36.0)
MCV: 119.3 fL — ABNORMAL HIGH (ref 80.0–100.0)
Monocytes Absolute: 0.6 10*3/uL (ref 0.1–1.0)
Monocytes Relative: 9 %
Neutro Abs: 4.5 10*3/uL (ref 1.7–7.7)
Neutrophils Relative %: 72 %
Platelet Count: 142 10*3/uL — ABNORMAL LOW (ref 150–400)
RBC: 3.06 MIL/uL — ABNORMAL LOW (ref 4.22–5.81)
RDW: 16.9 % — ABNORMAL HIGH (ref 11.5–15.5)
WBC Count: 6.2 10*3/uL (ref 4.0–10.5)
nRBC: 0 % (ref 0.0–0.2)

## 2020-05-21 ENCOUNTER — Ambulatory Visit: Payer: BC Managed Care – PPO | Admitting: Physician Assistant

## 2020-07-04 ENCOUNTER — Inpatient Hospital Stay: Payer: BC Managed Care – PPO | Attending: Internal Medicine | Admitting: Internal Medicine

## 2020-07-04 ENCOUNTER — Inpatient Hospital Stay: Payer: BC Managed Care – PPO

## 2020-07-04 ENCOUNTER — Other Ambulatory Visit: Payer: Self-pay

## 2020-07-04 ENCOUNTER — Encounter: Payer: Self-pay | Admitting: Internal Medicine

## 2020-07-04 VITALS — BP 135/82 | HR 79 | Temp 98.3°F | Resp 18 | Ht 66.0 in | Wt 172.1 lb

## 2020-07-04 DIAGNOSIS — D595 Paroxysmal nocturnal hemoglobinuria [Marchiafava-Micheli]: Secondary | ICD-10-CM | POA: Insufficient documentation

## 2020-07-04 DIAGNOSIS — Z5111 Encounter for antineoplastic chemotherapy: Secondary | ICD-10-CM

## 2020-07-04 DIAGNOSIS — Z7982 Long term (current) use of aspirin: Secondary | ICD-10-CM | POA: Insufficient documentation

## 2020-07-04 LAB — CBC WITH DIFFERENTIAL (CANCER CENTER ONLY)
Abs Immature Granulocytes: 0.01 10*3/uL (ref 0.00–0.07)
Basophils Absolute: 0 10*3/uL (ref 0.0–0.1)
Basophils Relative: 1 %
Eosinophils Absolute: 0.1 10*3/uL (ref 0.0–0.5)
Eosinophils Relative: 1 %
HCT: 38 % — ABNORMAL LOW (ref 39.0–52.0)
Hemoglobin: 13.5 g/dL (ref 13.0–17.0)
Immature Granulocytes: 0 %
Lymphocytes Relative: 26 %
Lymphs Abs: 1.3 10*3/uL (ref 0.7–4.0)
MCH: 41.2 pg — ABNORMAL HIGH (ref 26.0–34.0)
MCHC: 35.5 g/dL (ref 30.0–36.0)
MCV: 115.9 fL — ABNORMAL HIGH (ref 80.0–100.0)
Monocytes Absolute: 0.6 10*3/uL (ref 0.1–1.0)
Monocytes Relative: 12 %
Neutro Abs: 3.2 10*3/uL (ref 1.7–7.7)
Neutrophils Relative %: 60 %
Platelet Count: 127 10*3/uL — ABNORMAL LOW (ref 150–400)
RBC: 3.28 MIL/uL — ABNORMAL LOW (ref 4.22–5.81)
RDW: 13.7 % (ref 11.5–15.5)
WBC Count: 5.2 10*3/uL (ref 4.0–10.5)
nRBC: 0 % (ref 0.0–0.2)

## 2020-07-04 LAB — CMP (CANCER CENTER ONLY)
ALT: 142 U/L — ABNORMAL HIGH (ref 0–44)
AST: 128 U/L — ABNORMAL HIGH (ref 15–41)
Albumin: 4.2 g/dL (ref 3.5–5.0)
Alkaline Phosphatase: 62 U/L (ref 38–126)
Anion gap: 9 (ref 5–15)
BUN: 10 mg/dL (ref 6–20)
CO2: 23 mmol/L (ref 22–32)
Calcium: 9.3 mg/dL (ref 8.9–10.3)
Chloride: 104 mmol/L (ref 98–111)
Creatinine: 0.87 mg/dL (ref 0.61–1.24)
GFR, Estimated: >60 mL/min (ref >60)
Glucose, Bld: 98 mg/dL (ref 70–99)
Potassium: 3.6 mmol/L (ref 3.5–5.1)
Sodium: 136 mmol/L (ref 135–145)
Total Bilirubin: 3.4 mg/dL — ABNORMAL HIGH (ref 0.3–1.2)
Total Protein: 6.9 g/dL (ref 6.5–8.1)

## 2020-07-04 LAB — LACTATE DEHYDROGENASE: LDH: 227 U/L — ABNORMAL HIGH (ref 98–192)

## 2020-07-04 NOTE — Progress Notes (Signed)
[facility] Telephone:([phone]   Fax:(336) [phone]  OFFICE PROGRESS NOTE  [name], MD [address] 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  PRIOR THERAPY:  Soliris (Eculizumab) initially with induction 600 MG IV weekly for 4 weeks and then switched to maintenance treatment 900 MG IV every 2 weeks. First dose of treatment was given on 11/21/2014. Status post 45 cycles.  After cycle #45 the patient received his treatment at outside infusion center.  Status post 16 more cycles of treatment.  This was discontinued secondary to worsening symptoms and lack of control of his disease.  CURRENT THERAPY: Ultomiris (Ravulizumab) loading dose 2700 mg/M2 followed by maintenance dose 3300 mg/M2 every 8 weeks.  First dose of treatment September 22, 2018.  He status post the induction dose of the treatment as well as 10 cycle of maintenance therapy.  The first 3 cycles of his treatment were giving at the infusion center in [facility].    INTERVAL HISTORY: Travis Mcdaniel 50 y.o. male returns to the clinic today for follow-up visit.  The patient is feeling fine today with no concerning complaints.  He denied having any fatigue or weakness.  He denied having any chest pain, shortness of breath, cough or hemoptysis.  He has no nausea, vomiting, diarrhea or constipation.  He denied having any headache or visual changes.  He has no weight loss or night sweats.  He continues to tolerate his treatment with Ultomiris fairly well.  He is here today for evaluation and repeat blood work before starting cycle #11 of his treatment.  MEDICAL HISTORY: Past Medical History:  Diagnosis Date  . Cancer (HCC)    PNH (Bone marrow)  . Diverticulosis   . Encounter for antineoplastic chemotherapy 01/15/2016  . Hypertension     ALLERGIES:  has No Known Allergies.  MEDICATIONS:   Current Outpatient Medications  Medication Sig Dispense Refill  . ALPRAZolam (XANAX) 1 MG tablet Take 1 mg by mouth 3 (three) times daily.  2  . aspirin EC 81 MG tablet Take 81 mg by mouth.    [name] Kitchen lisinopril-hydrochlorothiazide (PRINZIDE,ZESTORETIC) 20-25 MG tablet Take 1 tablet by mouth daily.    . meloxicam (MOBIC) 7.5 MG tablet Take 1 tablet (7.5 mg total) by mouth daily. 30 tablet 1  . Multiple Vitamins-Minerals (MULTIVITAMIN ADULT PO) Take by mouth.    [name] Kitchen OVER THE COUNTER MEDICATION Take 1 tablet by mouth daily. OTC potassium    . oxyCODONE (OXY IR/ROXICODONE) 5 MG immediate release tablet Take 1 tablet (5 mg total) by mouth every 4 (four) hours as needed for severe pain. 10 tablet 0  . predniSONE (DELTASONE) 10 MG tablet 2 tablet p.o. daily for 1 week and then 1 tablet p.o. daily for 1 week. 21 tablet 0  . Psyllium (METAMUCIL PO) Take 1 capsule by mouth daily.    [name] Kitchen tetrahydrozoline 0.05 % ophthalmic solution Place 1 drop into both eyes 2 (two) times daily as needed (eye irritation). (Patient not taking: Reported on 03/13/2020)     No current facility-administered medications for this visit.    SURGICAL HISTORY: No past surgical history on file.  REVIEW OF SYSTEMS:  A comprehensive review of systems was negative.   PHYSICAL EXAMINATION: General appearance: alert, cooperative and no distress Head: Normocephalic, without obvious abnormality, atraumatic Neck: no adenopathy, no JVD, supple, symmetrical, trachea midline and thyroid not enlarged, symmetric, no tenderness/mass/nodules  Lymph nodes: Cervical, supraclavicular, and axillary nodes normal. Resp: clear to auscultation bilaterally Back: symmetric, no curvature. ROM normal. No CVA tenderness. Cardio: regular rate and rhythm, S1, S2 normal, no murmur, click, rub or gallop GI: soft, non-tender; bowel sounds normal; no masses,  no organomegaly Extremities: extremities normal, atraumatic, no cyanosis or edema  ECOG PERFORMANCE STATUS: 1  - Symptomatic but completely ambulatory  Blood pressure 135/82, pulse 79, temperature 98.3 F (36.8 C), temperature source Tympanic, resp. rate 18, height 5\' 6"  (1.676 m), weight 172 lb 1.6 oz (78.1 kg), SpO2 100 %.  LABORATORY DATA: Lab Results  Component Value Date   WBC 6.2 05/20/2020   HGB 11.5 (L) 05/20/2020   HCT 36.5 (L) 05/20/2020   MCV 119.3 (H) 05/20/2020   PLT 142 (L) 05/20/2020      Chemistry      Component Value Date/Time   NA 135 05/20/2020 0743   NA 136 06/28/2017 1236   K 3.8 05/20/2020 0743   K 4.1 06/28/2017 1236   CL 104 05/20/2020 0743   CO2 21 (L) 05/20/2020 0743   CO2 24 06/28/2017 1236   BUN 17 05/20/2020 0743   BUN 17.1 06/28/2017 1236   CREATININE 0.94 05/20/2020 0743   CREATININE 1.0 06/28/2017 1236      Component Value Date/Time   CALCIUM 8.8 (L) 05/20/2020 0743   CALCIUM 9.1 06/28/2017 1236   ALKPHOS 57 05/20/2020 0743   ALKPHOS 66 06/28/2017 1236   AST 33 05/20/2020 0743   AST 40 (H) 06/28/2017 1236   ALT 88 (H) 05/20/2020 0743   ALT 60 (H) 06/28/2017 1236   BILITOT 2.3 (H) 05/20/2020 0743   BILITOT 2.87 (H) 06/28/2017 1236       RADIOGRAPHIC STUDIES: No results found.  ASSESSMENT AND PLAN:  This is a very pleasant 50 years old white male with paroxysmal nocturnal hemoglobinuria and currently on treatment with Soliris on days 1 and 15 every 4 weeks status post 45 cycles.  He is also on the same treatment received at outside infusion center status post 19 more cycles. He also completed a course of tapered dose of prednisone. He is currently on treatment with Ultomiris status post induction dose followed by 10 cycles of maintenance therapy.  He is currently receiving his infusion of the Ultomiris at the [place] infusion center. The patient continues to tolerate his treatment well with no concerning adverse effects. I recommended for him to continue his current treatment with Ultomiris with maintenance cycle #11 today.  He will come  back for follow-up visit in 2 months for evaluation and repeat blood work before the next infusion. Dr. Gita Kudo at [facility] suggested for the patient to receive anticoagulation but he did not give him any prescription or gave a service recommendation regarding anticoagulation.  He is currently on aspirin daily. I may consider the patient for treatment with Eliquis of Xarelto if he can afford it. The patient was advised to call immediately if he has any concerning symptoms in the interval. The patient voices understanding of current disease status and treatment options and is in agreement with the current care plan. All questions were answered. The patient knows to call the clinic with any problems, questions or concerns. We can certainly see the patient much sooner if necessary.  Disclaimer: This note was dictated with voice recognition software. Similar sounding words can inadvertently be transcribed and may not be corrected upon review.

## 2020-07-07 ENCOUNTER — Other Ambulatory Visit: Payer: Self-pay | Admitting: Physician Assistant

## 2020-07-09 ENCOUNTER — Telehealth: Payer: Self-pay | Admitting: Internal Medicine

## 2020-07-09 NOTE — Telephone Encounter (Signed)
Scheduled per 1/6 los. Called and spoke with pt, confirmed 3/2 appts

## 2020-08-28 ENCOUNTER — Inpatient Hospital Stay: Payer: BC Managed Care – PPO

## 2020-08-28 ENCOUNTER — Other Ambulatory Visit: Payer: Self-pay

## 2020-08-28 ENCOUNTER — Inpatient Hospital Stay: Payer: BC Managed Care – PPO | Attending: Internal Medicine | Admitting: Internal Medicine

## 2020-08-28 VITALS — BP 113/63 | HR 88 | Temp 98.9°F | Resp 17 | Ht 66.0 in | Wt 175.5 lb

## 2020-08-28 DIAGNOSIS — I10 Essential (primary) hypertension: Secondary | ICD-10-CM | POA: Diagnosis not present

## 2020-08-28 DIAGNOSIS — D595 Paroxysmal nocturnal hemoglobinuria [Marchiafava-Micheli]: Secondary | ICD-10-CM | POA: Diagnosis present

## 2020-08-28 DIAGNOSIS — Z7982 Long term (current) use of aspirin: Secondary | ICD-10-CM | POA: Insufficient documentation

## 2020-08-28 LAB — CMP (CANCER CENTER ONLY)
ALT: 131 U/L — ABNORMAL HIGH (ref 0–44)
AST: 100 U/L — ABNORMAL HIGH (ref 15–41)
Albumin: 4.2 g/dL (ref 3.5–5.0)
Alkaline Phosphatase: 63 U/L (ref 38–126)
Anion gap: 10 (ref 5–15)
BUN: 14 mg/dL (ref 6–20)
CO2: 24 mmol/L (ref 22–32)
Calcium: 8.9 mg/dL (ref 8.9–10.3)
Chloride: 102 mmol/L (ref 98–111)
Creatinine: 0.92 mg/dL (ref 0.61–1.24)
GFR, Estimated: >60 mL/min (ref >60)
Glucose, Bld: 108 mg/dL — ABNORMAL HIGH (ref 70–99)
Potassium: 3.9 mmol/L (ref 3.5–5.1)
Sodium: 136 mmol/L (ref 135–145)
Total Bilirubin: 3.4 mg/dL — ABNORMAL HIGH (ref 0.3–1.2)
Total Protein: 6.6 g/dL (ref 6.5–8.1)

## 2020-08-28 LAB — CBC WITH DIFFERENTIAL (CANCER CENTER ONLY)
Abs Immature Granulocytes: 0.01 10*3/uL (ref 0.00–0.07)
Basophils Absolute: 0 10*3/uL (ref 0.0–0.1)
Basophils Relative: 1 %
Eosinophils Absolute: 0.1 10*3/uL (ref 0.0–0.5)
Eosinophils Relative: 2 %
HCT: 36.1 % — ABNORMAL LOW (ref 39.0–52.0)
Hemoglobin: 12.3 g/dL — ABNORMAL LOW (ref 13.0–17.0)
Immature Granulocytes: 0 %
Lymphocytes Relative: 23 %
Lymphs Abs: 0.9 10*3/uL (ref 0.7–4.0)
MCH: 39.7 pg — ABNORMAL HIGH (ref 26.0–34.0)
MCHC: 34.1 g/dL (ref 30.0–36.0)
MCV: 116.5 fL — ABNORMAL HIGH (ref 80.0–100.0)
Monocytes Absolute: 0.5 10*3/uL (ref 0.1–1.0)
Monocytes Relative: 12 %
Neutro Abs: 2.4 10*3/uL (ref 1.7–7.7)
Neutrophils Relative %: 62 %
Platelet Count: 158 10*3/uL (ref 150–400)
RBC: 3.1 MIL/uL — ABNORMAL LOW (ref 4.22–5.81)
RDW: 14.3 % (ref 11.5–15.5)
WBC Count: 3.9 10*3/uL — ABNORMAL LOW (ref 4.0–10.5)
nRBC: 0 % (ref 0.0–0.2)

## 2020-08-28 LAB — LACTATE DEHYDROGENASE: LDH: 239 U/L — ABNORMAL HIGH (ref 98–192)

## 2020-08-28 NOTE — Progress Notes (Signed)
[place] Telephone:(336) [phone]   Fax:(336) [phone]  OFFICE PROGRESS NOTE  [name], MD [address] 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  PRIOR THERAPY:  Soliris (Eculizumab) initially with induction 600 MG IV weekly for 4 weeks and then switched to maintenance treatment 900 MG IV every 2 weeks. First dose of treatment was given on 11/21/2014. Status post 45 cycles.  After cycle #45 the patient received his treatment at outside infusion center.  Status post 16 more cycles of treatment.  This was discontinued secondary to worsening symptoms and lack of control of his disease.  CURRENT THERAPY: Ultomiris (Ravulizumab) loading dose 2700 mg/M2 followed by maintenance dose 3300 mg/M2 every 8 weeks.  First dose of treatment September 22, 2018.  He status post the induction dose of the treatment as well as 11 cycle of maintenance therapy.  The first 3 cycles of his treatment were giving at the infusion center in [facility].    INTERVAL HISTORY: Travis Mcdaniel 50 y.o. male returns to the clinic today for follow-up visit.  The patient is feeling fine today with no concerning complaints except for occasional fatigue.  He continues to work full-time.  He denied having any current chest pain, shortness of breath, cough or hemoptysis.  He denied having any nausea, vomiting, diarrhea or constipation.  He has no headache or visual changes.  He denied having any weight loss or night sweats.  He continues to tolerate his treatment with Ultomiris fairly well.  The patient is here today for evaluation and repeat blood work before starting cycle #12.  MEDICAL HISTORY: Past Medical History:  Diagnosis Date  . Cancer (HCC)    PNH (Bone marrow)  . Diverticulosis   . Encounter for antineoplastic chemotherapy 01/15/2016  . Hypertension     ALLERGIES:  has No Known  Allergies.  MEDICATIONS:  Current Outpatient Medications  Medication Sig Dispense Refill  . ALPRAZolam (XANAX) 1 MG tablet Take 1 mg by mouth 3 (three) times daily.  2  . aspirin EC 81 MG tablet Take 81 mg by mouth.    [name] Kitchen lisinopril-hydrochlorothiazide (PRINZIDE,ZESTORETIC) 20-25 MG tablet Take 1 tablet by mouth daily.    . meloxicam (MOBIC) 7.5 MG tablet TAKE 1 TABLET(7.5 MG) BY MOUTH DAILY 30 tablet 1  . Multiple Vitamins-Minerals (MULTIVITAMIN ADULT PO) Take by mouth.    [name] Kitchen OVER THE COUNTER MEDICATION Take 1 tablet by mouth daily. OTC potassium    . oxyCODONE (OXY IR/ROXICODONE) 5 MG immediate release tablet Take 1 tablet (5 mg total) by mouth every 4 (four) hours as needed for severe pain. (Patient not taking: Reported on 07/04/2020) 10 tablet 0  . predniSONE (DELTASONE) 10 MG tablet 2 tablet p.o. daily for 1 week and then 1 tablet p.o. daily for 1 week. (Patient not taking: Reported on 07/04/2020) 21 tablet 0  . Psyllium (METAMUCIL PO) Take 1 capsule by mouth daily.    [name] Kitchen tetrahydrozoline 0.05 % ophthalmic solution Place 1 drop into both eyes 2 (two) times daily as needed (eye irritation). (Patient not taking: Reported on 03/13/2020)     No current facility-administered medications for this visit.    SURGICAL HISTORY: No past surgical history on file.  REVIEW OF SYSTEMS:  A comprehensive review of systems was negative except for: Constitutional: positive for fatigue   PHYSICAL EXAMINATION: General appearance: alert, cooperative, fatigued and no distress Head: Normocephalic, without  obvious abnormality, atraumatic Neck: no adenopathy, no JVD, supple, symmetrical, trachea midline and thyroid not enlarged, symmetric, no tenderness/mass/nodules Lymph nodes: Cervical, supraclavicular, and axillary nodes normal. Resp: clear to auscultation bilaterally Back: symmetric, no curvature. ROM normal. No CVA tenderness. Cardio: regular rate and rhythm, S1, S2 normal, no murmur, click, rub or gallop GI:  soft, non-tender; bowel sounds normal; no masses,  no organomegaly Extremities: extremities normal, atraumatic, no cyanosis or edema  ECOG PERFORMANCE STATUS: 1 - Symptomatic but completely ambulatory  Blood pressure 113/63, pulse 88, temperature 98.9 F (37.2 C), temperature source Tympanic, resp. rate 17, height 5\' 6"  (1.676 m), weight 175 lb 8 oz (79.6 kg), SpO2 99 %.  LABORATORY DATA: Lab Results  Component Value Date   WBC 3.9 (L) 08/28/2020   HGB 12.3 (L) 08/28/2020   HCT 36.1 (L) 08/28/2020   MCV 116.5 (H) 08/28/2020   PLT 158 08/28/2020      Chemistry      Component Value Date/Time   NA 136 08/28/2020 0746   NA 136 06/28/2017 1236   K 3.9 08/28/2020 0746   K 4.1 06/28/2017 1236   CL 102 08/28/2020 0746   CO2 24 08/28/2020 0746   CO2 24 06/28/2017 1236   BUN 14 08/28/2020 0746   BUN 17.1 06/28/2017 1236   CREATININE 0.92 08/28/2020 0746   CREATININE 1.0 06/28/2017 1236      Component Value Date/Time   CALCIUM 8.9 08/28/2020 0746   CALCIUM 9.1 06/28/2017 1236   ALKPHOS 63 08/28/2020 0746   ALKPHOS 66 06/28/2017 1236   AST 100 (H) 08/28/2020 0746   AST 40 (H) 06/28/2017 1236   ALT 131 (H) 08/28/2020 0746   ALT 60 (H) 06/28/2017 1236   BILITOT 3.4 (H) 08/28/2020 0746   BILITOT 2.87 (H) 06/28/2017 1236       RADIOGRAPHIC STUDIES: No results found.  ASSESSMENT AND PLAN:  This is a very pleasant 50 years old white male with paroxysmal nocturnal hemoglobinuria and currently on treatment with Soliris on days 1 and 15 every 4 weeks status post 45 cycles.  He is also on the same treatment received at outside infusion center status post 19 more cycles. He also completed a course of tapered dose of prednisone. He is currently on treatment with Ultomiris status post induction dose followed by 11 cycles of maintenance therapy.  He is currently receiving his infusion of the Ultomiris at the [facility] infusion center. The patient continues to tolerate his treatment  well with no concerning adverse effects. His lab work is acceptable for treatment today. I recommended for him to proceed with cycle #12 today as planned. Dr. Gita Kudo at [facility] suggested for the patient to receive anticoagulation but he did not give him any prescription or gave a service recommendation regarding anticoagulation.  He is currently on aspirin daily.  The patient is also concerned about the cost of Eliquis. I will see him back for follow-up visit in 8 weeks for evaluation before the next cycle of his treatment. He was advised to call immediately if he has any concerning symptoms in the interval. The patient voices understanding of current disease status and treatment options and is in agreement with the current care plan. All questions were answered. The patient knows to call the clinic with any problems, questions or concerns. We can certainly see the patient much sooner if necessary.  Disclaimer: This note was dictated with voice recognition software. Similar sounding words can inadvertently be transcribed and may not be corrected  upon review.

## 2020-08-29 ENCOUNTER — Telehealth: Payer: Self-pay | Admitting: Internal Medicine

## 2020-08-29 NOTE — Telephone Encounter (Signed)
Scheduled per los. Called and spoke with patient. Confirmed appt 

## 2020-10-23 ENCOUNTER — Inpatient Hospital Stay: Payer: BC Managed Care – PPO | Attending: Internal Medicine | Admitting: Internal Medicine

## 2020-10-23 ENCOUNTER — Telehealth: Payer: Self-pay | Admitting: Internal Medicine

## 2020-10-23 ENCOUNTER — Inpatient Hospital Stay: Payer: BC Managed Care – PPO

## 2020-10-23 ENCOUNTER — Encounter: Payer: Self-pay | Admitting: Internal Medicine

## 2020-10-23 ENCOUNTER — Other Ambulatory Visit: Payer: Self-pay

## 2020-10-23 VITALS — BP 132/79 | HR 76 | Temp 96.8°F | Resp 18 | Wt 177.0 lb

## 2020-10-23 DIAGNOSIS — Z7982 Long term (current) use of aspirin: Secondary | ICD-10-CM | POA: Insufficient documentation

## 2020-10-23 DIAGNOSIS — D595 Paroxysmal nocturnal hemoglobinuria [Marchiafava-Micheli]: Secondary | ICD-10-CM

## 2020-10-23 DIAGNOSIS — Z9221 Personal history of antineoplastic chemotherapy: Secondary | ICD-10-CM | POA: Insufficient documentation

## 2020-10-23 DIAGNOSIS — Z5111 Encounter for antineoplastic chemotherapy: Secondary | ICD-10-CM

## 2020-10-23 DIAGNOSIS — Z79899 Other long term (current) drug therapy: Secondary | ICD-10-CM | POA: Diagnosis not present

## 2020-10-23 DIAGNOSIS — I10 Essential (primary) hypertension: Secondary | ICD-10-CM | POA: Diagnosis not present

## 2020-10-23 DIAGNOSIS — Z791 Long term (current) use of non-steroidal anti-inflammatories (NSAID): Secondary | ICD-10-CM | POA: Diagnosis not present

## 2020-10-23 LAB — CMP (CANCER CENTER ONLY)
ALT: 95 U/L — ABNORMAL HIGH (ref 0–44)
AST: 80 U/L — ABNORMAL HIGH (ref 15–41)
Albumin: 4.3 g/dL (ref 3.5–5.0)
Alkaline Phosphatase: 53 U/L (ref 38–126)
Anion gap: 11 (ref 5–15)
BUN: 15 mg/dL (ref 6–20)
CO2: 23 mmol/L (ref 22–32)
Calcium: 8.8 mg/dL — ABNORMAL LOW (ref 8.9–10.3)
Chloride: 104 mmol/L (ref 98–111)
Creatinine: 0.8 mg/dL (ref 0.61–1.24)
GFR, Estimated: >60 mL/min (ref >60)
Glucose, Bld: 109 mg/dL — ABNORMAL HIGH (ref 70–99)
Potassium: 3.8 mmol/L (ref 3.5–5.1)
Sodium: 138 mmol/L (ref 135–145)
Total Bilirubin: 2.4 mg/dL — ABNORMAL HIGH (ref 0.3–1.2)
Total Protein: 6.7 g/dL (ref 6.5–8.1)

## 2020-10-23 LAB — CBC WITH DIFFERENTIAL (CANCER CENTER ONLY)
Abs Immature Granulocytes: 0.02 10*3/uL (ref 0.00–0.07)
Basophils Absolute: 0 10*3/uL (ref 0.0–0.1)
Basophils Relative: 1 %
Eosinophils Absolute: 0.1 10*3/uL (ref 0.0–0.5)
Eosinophils Relative: 3 %
HCT: 35.4 % — ABNORMAL LOW (ref 39.0–52.0)
Hemoglobin: 12.8 g/dL — ABNORMAL LOW (ref 13.0–17.0)
Immature Granulocytes: 0 %
Lymphocytes Relative: 18 %
Lymphs Abs: 0.8 10*3/uL (ref 0.7–4.0)
MCH: 41.3 pg — ABNORMAL HIGH (ref 26.0–34.0)
MCHC: 36.2 g/dL — ABNORMAL HIGH (ref 30.0–36.0)
MCV: 114.2 fL — ABNORMAL HIGH (ref 80.0–100.0)
Monocytes Absolute: 0.5 10*3/uL (ref 0.1–1.0)
Monocytes Relative: 10 %
Neutro Abs: 3.1 10*3/uL (ref 1.7–7.7)
Neutrophils Relative %: 68 %
Platelet Count: 115 10*3/uL — ABNORMAL LOW (ref 150–400)
RBC: 3.1 MIL/uL — ABNORMAL LOW (ref 4.22–5.81)
RDW: 13.1 % (ref 11.5–15.5)
WBC Count: 4.6 10*3/uL (ref 4.0–10.5)
nRBC: 0 % (ref 0.0–0.2)

## 2020-10-23 LAB — LACTATE DEHYDROGENASE: LDH: 243 U/L — ABNORMAL HIGH (ref 98–192)

## 2020-10-23 NOTE — Telephone Encounter (Signed)
Scheduled follow-up appointment per 4/27 los. Patient is aware. 

## 2020-10-23 NOTE — Progress Notes (Signed)
[place] Telephone:(336) [phone]   Fax:([phone]  OFFICE PROGRESS NOTE  [name], MD [address] 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  PRIOR THERAPY:  Soliris (Eculizumab) initially with induction 600 MG IV weekly for 4 weeks and then switched to maintenance treatment 900 MG IV every 2 weeks. First dose of treatment was given on 11/21/2014. Status post 45 cycles.  After cycle #45 the patient received his treatment at outside infusion center.  Status post 16 more cycles of treatment.  This was discontinued secondary to worsening symptoms and lack of control of his disease.  CURRENT THERAPY: Ultomiris (Ravulizumab) loading dose 2700 mg/M2 followed by maintenance dose 3300 mg/M2 every 8 weeks.  First dose of treatment September 22, 2018.  He status post the induction dose of the treatment as well as 12 cycle of maintenance therapy.  The first 3 cycles of his treatment were given at the infusion center in [facility].    INTERVAL HISTORY: Travis Mcdaniel 50 y.o. male returns to the clinic today for follow-up visit.  The patient is feeling fine today with no concerning complaints except for fatigue.  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  He denied having any current chest pain, shortness of breath, cough or hemoptysis.  He denied having any fever or chills.  He has no nausea, vomiting, diarrhea or constipation.  He denied having any headache or visual changes.  He has no weight loss or night sweats.  He continues to tolerate his treatment with Ultomiris fairly well.  The patient is here today for evaluation and have repeat blood work.   MEDICAL HISTORY: Past Medical History:  Diagnosis Date  . Cancer (HCC)    PNH (Bone marrow)  . Diverticulosis   . Encounter for antineoplastic chemotherapy 01/15/2016  . Hypertension     ALLERGIES:  has  No Known Allergies.  MEDICATIONS:  Current Outpatient Medications  Medication Sig Dispense Refill  . ALPRAZolam (XANAX) 1 MG tablet Take 1 mg by mouth 3 (three) times daily.  2  . aspirin EC 81 MG tablet Take 81 mg by mouth.    [name] Kitchen lisinopril-hydrochlorothiazide (PRINZIDE,ZESTORETIC) 20-25 MG tablet Take 1 tablet by mouth daily.    . meloxicam (MOBIC) 7.5 MG tablet TAKE 1 TABLET(7.5 MG) BY MOUTH DAILY 30 tablet 1  . Multiple Vitamins-Minerals (MULTIVITAMIN ADULT PO) Take by mouth.    [name] Kitchen OVER THE COUNTER MEDICATION Take 1 tablet by mouth daily. OTC potassium    . predniSONE (DELTASONE) 10 MG tablet 2 tablet p.o. daily for 1 week and then 1 tablet p.o. daily for 1 week. (Patient not taking: No sig reported) 21 tablet 0  . Psyllium (METAMUCIL PO) Take 1 capsule by mouth daily.    [name] Kitchen tetrahydrozoline 0.05 % ophthalmic solution Place 1 drop into both eyes 2 (two) times daily as needed (eye irritation).     No current facility-administered medications for this visit.    SURGICAL HISTORY: No past surgical history on file.  REVIEW OF SYSTEMS:  A comprehensive review of systems was negative except for: Constitutional: positive for fatigue   PHYSICAL EXAMINATION: General appearance: alert, cooperative, fatigued and no distress Head: Normocephalic, without obvious abnormality, atraumatic Neck: no adenopathy, no JVD, supple, symmetrical, trachea midline and thyroid not enlarged, symmetric, no tenderness/mass/nodules Lymph nodes: Cervical, supraclavicular, and axillary nodes normal. Resp: clear to auscultation bilaterally Back: symmetric, no  curvature. ROM normal. No CVA tenderness. Cardio: regular rate and rhythm, S1, S2 normal, no murmur, click, rub or gallop GI: soft, non-tender; bowel sounds normal; no masses,  no organomegaly Extremities: extremities normal, atraumatic, no cyanosis or edema  ECOG PERFORMANCE STATUS: 1 - Symptomatic but completely ambulatory  Blood pressure 132/79, pulse 76,  temperature (!) 96.8 F (36 C), temperature source Tympanic, resp. rate 18, weight 177 lb (80.3 kg), SpO2 100 %.  LABORATORY DATA: Lab Results  Component Value Date   WBC 4.6 10/23/2020   HGB 12.8 (L) 10/23/2020   HCT 35.4 (L) 10/23/2020   MCV 114.2 (H) 10/23/2020   PLT 115 (L) 10/23/2020      Chemistry      Component Value Date/Time   NA 136 08/28/2020 0746   NA 136 06/28/2017 1236   K 3.9 08/28/2020 0746   K 4.1 06/28/2017 1236   CL 102 08/28/2020 0746   CO2 24 08/28/2020 0746   CO2 24 06/28/2017 1236   BUN 14 08/28/2020 0746   BUN 17.1 06/28/2017 1236   CREATININE 0.92 08/28/2020 0746   CREATININE 1.0 06/28/2017 1236      Component Value Date/Time   CALCIUM 8.9 08/28/2020 0746   CALCIUM 9.1 06/28/2017 1236   ALKPHOS 63 08/28/2020 0746   ALKPHOS 66 06/28/2017 1236   AST 100 (H) 08/28/2020 0746   AST 40 (H) 06/28/2017 1236   ALT 131 (H) 08/28/2020 0746   ALT 60 (H) 06/28/2017 1236   BILITOT 3.4 (H) 08/28/2020 0746   BILITOT 2.87 (H) 06/28/2017 1236       RADIOGRAPHIC STUDIES: No results found.  ASSESSMENT AND PLAN:  This is a very pleasant 50 years old white male with paroxysmal nocturnal hemoglobinuria and currently on treatment with Soliris on days 1 and 15 every 4 weeks status post 45 cycles.  He is also on the same treatment received at outside infusion center status post 19 more cycles. He also completed a course of tapered dose of prednisone. He is currently on treatment with Ultomiris status post induction dose followed by 12 cycles of maintenance therapy.  He is currently receiving his infusion of the Ultomiris at the [facility] infusion center. The patient continues to tolerate his treatment with Ultomiris fairly well. I recommended for him to continue his treatment as planned.  His CBC today is unremarkable except for mild anemia and thrombocytopenia.  Comprehensive metabolic panel and LDH are still pending. Dr. Gita Kudo at [facility] suggested for the patient to  receive anticoagulation but he did not give him any prescription or gave a service recommendation regarding anticoagulation.  He is currently on aspirin daily.  The patient is also concerned about the cost of Eliquis. He will come back for follow-up visit in 8 weeks for evaluation and repeat blood work. He was advised to call immediately if he has any concerning symptoms in the interval. The patient voices understanding of current disease status and treatment options and is in agreement with the current care plan. All questions were answered. The patient knows to call the clinic with any problems, questions or concerns. We can certainly see the patient much sooner if necessary.  Disclaimer: This note was dictated with voice recognition software. Similar sounding words can inadvertently be transcribed and may not be corrected upon review.

## 2020-11-01 ENCOUNTER — Other Ambulatory Visit: Payer: Self-pay

## 2020-11-01 ENCOUNTER — Inpatient Hospital Stay: Payer: BC Managed Care – PPO | Attending: Internal Medicine

## 2020-11-01 DIAGNOSIS — Z23 Encounter for immunization: Secondary | ICD-10-CM | POA: Diagnosis not present

## 2020-11-20 ENCOUNTER — Other Ambulatory Visit: Payer: Self-pay | Admitting: Physician Assistant

## 2020-12-04 ENCOUNTER — Telehealth: Payer: Self-pay | Admitting: Medical Oncology
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
## 2020-12-06 ENCOUNTER — Telehealth: Payer: Self-pay | Admitting: Medical Oncology

## 2020-12-06 NOTE — Telephone Encounter (Addendum)
[facility] infusion will be closing the [facility] .  Pt will need to dive to [facility] for infusions or  receive his Ultomiris at home.  Pt said he wants to do it at home if ok with [facility].  Is Dr Julien Nordmann ok with pt getting ultomiris at  home?

## 2020-12-09 ENCOUNTER — Telehealth: Payer: Self-pay | Admitting: Medical Oncology

## 2020-12-09 NOTE — Telephone Encounter (Signed)
[facility] -  Returned call to [place] at [facility] . She left me a VM earlier saying pt was going to get his infusion at [facility], I told her that pt told me he was going to receive it at home.

## 2020-12-11 ENCOUNTER — Other Ambulatory Visit: Payer: Self-pay | Admitting: Medical Oncology

## 2020-12-11 ENCOUNTER — Encounter: Payer: Self-pay | Admitting: Internal Medicine

## 2020-12-11 ENCOUNTER — Telehealth: Payer: Self-pay | Admitting: Medical Oncology

## 2020-12-11 NOTE — Progress Notes (Signed)
Ultomiris Orders faxed

## 2020-12-11 NOTE — Telephone Encounter (Signed)
Faxed referral /insurance /demographics,notes,immunization record, path /cytogenics  to [facility] infusion center and their Pharmacy [facility]   Ultomirs due on 12/18/20.

## 2020-12-12 ENCOUNTER — Telehealth: Payer: Self-pay | Admitting: Medical Oncology

## 2020-12-12 NOTE — Telephone Encounter (Signed)
Received request to complete Ultomiris order sheet- from [employer]  LVM for [facility] and Darrick Meigs that ULTOMIRIS order sheet completed and to please call me back to go over orders to see if anything else is needed.Spoke to [place] and  Ultomiris orders faxed to both [place].

## 2020-12-12 NOTE — Telephone Encounter (Signed)
Pt updated regarding getting tx at [facility].  Per [facility] infusion center pt still has appt at [facility] facility on 12/18/20 if [facility] does not have authorization and paperwork completed by the 06/22. I lvm with this information on pt phone and told him to keep appt at [facility] unless he hears from [facility] that they can give his Ultomiris on 12/18/20 . Then he needs to cancel with [facility].

## 2020-12-17 ENCOUNTER — Encounter: Payer: Self-pay | Admitting: Internal Medicine

## 2020-12-18 ENCOUNTER — Inpatient Hospital Stay: Payer: 59

## 2020-12-18 ENCOUNTER — Other Ambulatory Visit: Payer: Self-pay

## 2020-12-18 ENCOUNTER — Telehealth: Payer: Self-pay

## 2020-12-18 ENCOUNTER — Inpatient Hospital Stay: Payer: 59 | Attending: Internal Medicine | Admitting: Internal Medicine

## 2020-12-18 VITALS — BP 131/64 | HR 73 | Temp 97.9°F | Resp 18 | Wt 170.2 lb

## 2020-12-18 DIAGNOSIS — Z7982 Long term (current) use of aspirin: Secondary | ICD-10-CM | POA: Diagnosis not present

## 2020-12-18 DIAGNOSIS — Z5111 Encounter for antineoplastic chemotherapy: Secondary | ICD-10-CM

## 2020-12-18 DIAGNOSIS — D595 Paroxysmal nocturnal hemoglobinuria [Marchiafava-Micheli]: Secondary | ICD-10-CM | POA: Diagnosis present

## 2020-12-18 LAB — CBC WITH DIFFERENTIAL (CANCER CENTER ONLY)
Abs Immature Granulocytes: 0.01 10*3/uL (ref 0.00–0.07)
Basophils Absolute: 0 10*3/uL (ref 0.0–0.1)
Basophils Relative: 1 %
Eosinophils Absolute: 0.1 10*3/uL (ref 0.0–0.5)
Eosinophils Relative: 1 %
HCT: 31.6 % — ABNORMAL LOW (ref 39.0–52.0)
Hemoglobin: 10.9 g/dL — ABNORMAL LOW (ref 13.0–17.0)
Immature Granulocytes: 0 %
Lymphocytes Relative: 21 %
Lymphs Abs: 0.8 10*3/uL (ref 0.7–4.0)
MCH: 40.2 pg — ABNORMAL HIGH (ref 26.0–34.0)
MCHC: 34.5 g/dL (ref 30.0–36.0)
MCV: 116.6 fL — ABNORMAL HIGH (ref 80.0–100.0)
Monocytes Absolute: 0.4 10*3/uL (ref 0.1–1.0)
Monocytes Relative: 10 %
Neutro Abs: 2.5 10*3/uL (ref 1.7–7.7)
Neutrophils Relative %: 67 %
Platelet Count: 118 10*3/uL — ABNORMAL LOW (ref 150–400)
RBC: 2.71 MIL/uL — ABNORMAL LOW (ref 4.22–5.81)
RDW: 14 % (ref 11.5–15.5)
WBC Count: 3.7 10*3/uL — ABNORMAL LOW (ref 4.0–10.5)
nRBC: 0 % (ref 0.0–0.2)

## 2020-12-18 LAB — CMP (CANCER CENTER ONLY)
ALT: 74 U/L — ABNORMAL HIGH (ref 0–44)
AST: 48 U/L — ABNORMAL HIGH (ref 15–41)
Albumin: 4 g/dL (ref 3.5–5.0)
Alkaline Phosphatase: 52 U/L (ref 38–126)
Anion gap: 11 (ref 5–15)
BUN: 9 mg/dL (ref 6–20)
CO2: 22 mmol/L (ref 22–32)
Calcium: 9.1 mg/dL (ref 8.9–10.3)
Chloride: 103 mmol/L (ref 98–111)
Creatinine: 0.83 mg/dL (ref 0.61–1.24)
GFR, Estimated: >60 mL/min (ref >60)
Glucose, Bld: 106 mg/dL — ABNORMAL HIGH (ref 70–99)
Potassium: 3.8 mmol/L (ref 3.5–5.1)
Sodium: 136 mmol/L (ref 135–145)
Total Bilirubin: 2.4 mg/dL — ABNORMAL HIGH (ref 0.3–1.2)
Total Protein: 6.6 g/dL (ref 6.5–8.1)

## 2020-12-18 LAB — LACTATE DEHYDROGENASE: LDH: 188 U/L (ref 98–192)
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
## 2020-12-18 NOTE — Telephone Encounter (Signed)
I called [facility] regarding pts infusion and was advised they have to transport his tx to [facility] location and won't be able to get him in until Thursday 12/26/20. They cannot expedite pts tx delivery but will try to get him on on Tuesday or Wednesday. They state they will secure the medication order and contact the pt.  Pt is aware of this as he was present in the office during the call.

## 2020-12-20 ENCOUNTER — Telehealth: Payer: Self-pay | Admitting: Internal Medicine

## 2020-12-20 NOTE — Telephone Encounter (Signed)
Scheduled per los. Called not able to leave msg. Mailed printout

## 2021-01-01 ENCOUNTER — Other Ambulatory Visit: Payer: Self-pay | Admitting: Physician Assistant

## 2021-02-07 ENCOUNTER — Telehealth: Payer: Self-pay | Admitting: Medical Oncology

## 2021-02-07 NOTE — Telephone Encounter (Addendum)
Edwardo stated Dr [name] started him on  Paxlovid 300 mg bid x 5 days.  H is scheduled for tx next wed. Can he come in to appt on wed?

## 2021-02-07 NOTE — Telephone Encounter (Signed)
COVID home test positive-today -( Coworker tested positive for COVID  yesterday).   Tuesday- starting  sneezing, dry cough, sweating . Taking coricidin. Feels good otherwise. I told him to manage symptoms with OTC meds.    Monoclonals -is he eligible ? I instructed him to contact his PCP for this question.   Does he need to do anything else?

## 2021-02-10 ENCOUNTER — Telehealth: Payer: Self-pay | Admitting: Medical Oncology

## 2021-02-10 NOTE — Telephone Encounter (Signed)
Per Dr. Julien Nordmann, I told Travis Mcdaniel he has to wait until aug 29th before coming into  the cancer center .   He is worried because day 90 of ultomiris is aug 22nd .  He is requesting  prednisone  to cover him during the gap 8/ 22-28 until  he comes back . [name] Kitchen "I don't want to start peeing blood again".

## 2021-02-11 ENCOUNTER — Telehealth: Payer: Self-pay | Admitting: Internal Medicine

## 2021-02-11 NOTE — Telephone Encounter (Signed)
Called pt to sch appts per 8/15 sch msg. Pt declined to sch at this time stating he had already spoke to RN Diane about scheduling an infusion and wanted that scheduled before anything else. I asked if he wanted to go ahead and get it on the calendar and I would confirm about the infusion, but he still refused to sch. I sent a secure chat to RN Diane to see how we should proceed with scheduling and told the pt we would follow up with him.

## 2021-02-12 ENCOUNTER — Inpatient Hospital Stay: Payer: 59

## 2021-02-12 ENCOUNTER — Telehealth: Payer: Self-pay | Admitting: Internal Medicine

## 2021-02-12 ENCOUNTER — Telehealth: Payer: Self-pay | Admitting: Medical Oncology

## 2021-02-12 ENCOUNTER — Inpatient Hospital Stay: Payer: 59 | Admitting: Internal Medicine

## 2021-02-12 NOTE — Telephone Encounter (Signed)
Patient called to reschedule 08/17 appointment to 08/29 due to positive covid test.

## 2021-02-12 NOTE — Telephone Encounter (Signed)
Pt reported he took 2 home Covid tests today and both were negative . Apptsscheduled for next week.

## 2021-02-18 ENCOUNTER — Inpatient Hospital Stay (HOSPITAL_BASED_OUTPATIENT_CLINIC_OR_DEPARTMENT_OTHER): Payer: 59 | Admitting: Internal Medicine

## 2021-02-18 ENCOUNTER — Other Ambulatory Visit: Payer: Self-pay

## 2021-02-18 ENCOUNTER — Inpatient Hospital Stay: Payer: 59 | Attending: Internal Medicine

## 2021-02-18 VITALS — BP 139/80 | HR 84 | Temp 96.8°F | Resp 18 | Wt 169.4 lb

## 2021-02-18 DIAGNOSIS — Z7982 Long term (current) use of aspirin: Secondary | ICD-10-CM | POA: Diagnosis not present

## 2021-02-18 DIAGNOSIS — D595 Paroxysmal nocturnal hemoglobinuria [Marchiafava-Micheli]: Secondary | ICD-10-CM | POA: Diagnosis not present

## 2021-02-18 DIAGNOSIS — Z5111 Encounter for antineoplastic chemotherapy: Secondary | ICD-10-CM

## 2021-02-18 LAB — CBC WITH DIFFERENTIAL (CANCER CENTER ONLY)
Abs Immature Granulocytes: 0.02 10*3/uL (ref 0.00–0.07)
Basophils Absolute: 0 10*3/uL (ref 0.0–0.1)
Basophils Relative: 1 %
Eosinophils Absolute: 0.1 10*3/uL (ref 0.0–0.5)
Eosinophils Relative: 2 %
HCT: 34.7 % — ABNORMAL LOW (ref 39.0–52.0)
Hemoglobin: 11.8 g/dL — ABNORMAL LOW (ref 13.0–17.0)
Immature Granulocytes: 0 %
Lymphocytes Relative: 19 %
Lymphs Abs: 1.2 10*3/uL (ref 0.7–4.0)
MCH: 41 pg — ABNORMAL HIGH (ref 26.0–34.0)
MCHC: 34 g/dL (ref 30.0–36.0)
MCV: 120.5 fL — ABNORMAL HIGH (ref 80.0–100.0)
Monocytes Absolute: 0.6 10*3/uL (ref 0.1–1.0)
Monocytes Relative: 10 %
Neutro Abs: 4.2 10*3/uL (ref 1.7–7.7)
Neutrophils Relative %: 68 %
Platelet Count: 151 10*3/uL (ref 150–400)
RBC: 2.88 MIL/uL — ABNORMAL LOW (ref 4.22–5.81)
RDW: 17.2 % — ABNORMAL HIGH (ref 11.5–15.5)
WBC Count: 6.2 10*3/uL (ref 4.0–10.5)
nRBC: 0.3 % — ABNORMAL HIGH (ref 0.0–0.2)

## 2021-02-18 LAB — CMP (CANCER CENTER ONLY)
ALT: 85 U/L — ABNORMAL HIGH (ref 0–44)
AST: 57 U/L — ABNORMAL HIGH (ref 15–41)
Albumin: 4.4 g/dL (ref 3.5–5.0)
Alkaline Phosphatase: 64 U/L (ref 38–126)
Anion gap: 13 (ref 5–15)
BUN: 10 mg/dL (ref 6–20)
CO2: 24 mmol/L (ref 22–32)
Calcium: 9.5 mg/dL (ref 8.9–10.3)
Chloride: 100 mmol/L (ref 98–111)
Creatinine: 0.97 mg/dL (ref 0.61–1.24)
GFR, Estimated: >60 mL/min (ref >60)
Glucose, Bld: 123 mg/dL — ABNORMAL HIGH (ref 70–99)
Potassium: 3.8 mmol/L (ref 3.5–5.1)
Sodium: 137 mmol/L (ref 135–145)
Total Bilirubin: 3.4 mg/dL — ABNORMAL HIGH (ref 0.3–1.2)
Total Protein: 7 g/dL (ref 6.5–8.1)

## 2021-02-18 LAB — LACTATE DEHYDROGENASE: LDH: 255 U/L — ABNORMAL HIGH (ref 98–192)

## 2021-02-18 NOTE — Progress Notes (Signed)
[facility] Telephone:(336) [phone]   Fax:([phone]  OFFICE PROGRESS NOTE  [name], MD [address]. 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  PRIOR THERAPY:  Soliris (Eculizumab) initially with induction 600 MG IV weekly for 4 weeks and then switched to maintenance treatment 900 MG IV every 2 weeks. First dose of treatment was given on 11/21/2014. Status post 45 cycles.  After cycle #45 the patient received his treatment at outside infusion center.  Status post 16 more cycles of treatment.  This was discontinued secondary to worsening symptoms and lack of control of his disease.  CURRENT THERAPY: Ultomiris (Ravulizumab) loading dose 2700 mg/M2 followed by maintenance dose 3300 mg/M2 every 8 weeks.  First dose of treatment September 22, 2018.  He status post the induction dose of the treatment as well as 14 cycle of maintenance therapy.  The first 3 cycles of his treatment were given at the infusion center in [facility].    INTERVAL HISTORY: Travis Mcdaniel 50 y.o. male returns to the clinic today for follow-up visit.  The patient is feeling much better today.  He was diagnosed with COVID-19 around 2 weeks ago.  He was treated by his primary care physician with Paxlovid.  He had 3 negative test recently.  He denied having any current chest pain, shortness of breath, cough or hemoptysis.  He denied having any fever or chills.  He has no nausea, vomiting, diarrhea or constipation.  He has no headache or visual changes.  He is here today for evaluation and repeat blood work.  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  MEDICATIONS:  Current Outpatient Medications   Medication Sig Dispense Refill   ALPRAZolam (XANAX) 1 MG tablet Take 1 mg by mouth 3 (three) times daily.  2   aspirin EC 81 MG tablet Take 81 mg by mouth.     lisinopril-hydrochlorothiazide (PRINZIDE,ZESTORETIC) 20-25 MG tablet Take 1 tablet by mouth daily.     meloxicam (MOBIC) 7.5 MG tablet TAKE 1 TABLET(7.5 MG) BY MOUTH DAILY 30 tablet 1   Multiple Vitamins-Minerals (MULTIVITAMIN ADULT PO) Take by mouth.     OVER THE COUNTER MEDICATION Take 1 tablet by mouth daily. OTC potassium     predniSONE (DELTASONE) 10 MG tablet 2 tablet p.o. daily for 1 week and then 1 tablet p.o. daily for 1 week. (Patient not taking: Reported on 10/23/2020) 21 tablet 0   Psyllium (METAMUCIL PO) Take 1 capsule by mouth daily.     tetrahydrozoline 0.05 % ophthalmic solution Place 1 drop into both eyes 2 (two) times daily as needed (eye irritation).     traMADol (ULTRAM) 50 MG tablet Take 50 mg by mouth 2 (two) times daily.     No current facility-administered medications for this visit.    SURGICAL HISTORY: No past surgical history on file.  REVIEW OF SYSTEMS:  A comprehensive review of systems was negative.   PHYSICAL EXAMINATION: General appearance: alert, cooperative, fatigued, and no distress Head: Normocephalic, without obvious abnormality, atraumatic Neck: no adenopathy, no JVD, supple, symmetrical, trachea midline, and thyroid not enlarged, symmetric, no tenderness/mass/nodules Lymph nodes: Cervical, supraclavicular, and axillary nodes normal. Resp: clear to auscultation bilaterally Back: symmetric,  no curvature. ROM normal. No CVA tenderness. Cardio: regular rate and rhythm, S1, S2 normal, no murmur, click, rub or gallop GI: soft, non-tender; bowel sounds normal; no masses,  no organomegaly Extremities: extremities normal, atraumatic, no cyanosis or edema  ECOG PERFORMANCE STATUS: 1 - Symptomatic but completely ambulatory  Blood pressure 139/80, pulse 84, temperature (!) 96.8 F (36 C), temperature  source Tympanic, resp. rate 18, weight 169 lb 7 oz (76.9 kg), SpO2 100 %.  LABORATORY DATA: Lab Results  Component Value Date   WBC 6.2 02/18/2021   HGB 11.8 (L) 02/18/2021   HCT 34.7 (L) 02/18/2021   MCV 120.5 (H) 02/18/2021   PLT 151 02/18/2021      Chemistry      Component Value Date/Time   NA 136 12/18/2020 0742   NA 136 06/28/2017 1236   K 3.8 12/18/2020 0742   K 4.1 06/28/2017 1236   CL 103 12/18/2020 0742   CO2 22 12/18/2020 0742   CO2 24 06/28/2017 1236   BUN 9 12/18/2020 0742   BUN 17.1 06/28/2017 1236   CREATININE 0.83 12/18/2020 0742   CREATININE 1.0 06/28/2017 1236      Component Value Date/Time   CALCIUM 9.1 12/18/2020 0742   CALCIUM 9.1 06/28/2017 1236   ALKPHOS 52 12/18/2020 0742   ALKPHOS 66 06/28/2017 1236   AST 48 (H) 12/18/2020 0742   AST 40 (H) 06/28/2017 1236   ALT 74 (H) 12/18/2020 0742   ALT 60 (H) 06/28/2017 1236   BILITOT 2.4 (H) 12/18/2020 0742   BILITOT 2.87 (H) 06/28/2017 1236       RADIOGRAPHIC STUDIES: No results found.  ASSESSMENT AND PLAN:  This is a very pleasant 50 years old white male with paroxysmal nocturnal hemoglobinuria and currently on treatment with Soliris on days 1 and 15 every 4 weeks status post 45 cycles.  He is also on the same treatment received at outside infusion center status post 19 more cycles. He also completed a course of tapered dose of prednisone. He is currently on treatment with Ultomiris status post induction dose followed by 14 cycles of maintenance therapy.  He is currently receiving his infusion of the Ultomiris at the [facility] infusion center. He continues to tolerate his treatment well with no concerning adverse effects. CBC today showed mild anemia but otherwise normal. I recommended for the patient to proceed with cycle #15 today as planned. I will see him back for follow-up visit in 8 weeks for evaluation before starting cycle #16 of his treatment. Dr. Gita Kudo at [facility] suggested for the patient to  receive anticoagulation but he did not give him any prescription or gave a service recommendation regarding anticoagulation.  He is currently on aspirin daily.  The patient is also concerned about the cost of Eliquis. The patient was advised to call immediately if he has any other concerning symptoms in the interval. The patient voices understanding of current disease status and treatment options and is in agreement with the current care plan. All questions were answered. The patient knows to call the clinic with any problems, questions or concerns. We can certainly see the patient much sooner if necessary.  Disclaimer: This note was dictated with voice recognition software. Similar sounding words can inadvertently be transcribed and may not be corrected upon review.

## 2021-02-20 ENCOUNTER — Other Ambulatory Visit: Payer: 59

## 2021-02-20 ENCOUNTER — Ambulatory Visit: Payer: 59 | Admitting: Internal Medicine

## 2021-02-24 ENCOUNTER — Other Ambulatory Visit: Payer: 59

## 2021-02-24 ENCOUNTER — Ambulatory Visit: Payer: 59 | Admitting: Internal Medicine

## 2021-04-15 NOTE — Progress Notes (Signed)
[facility] OFFICE PROGRESS NOTE  [name], MD [address] 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  PRIOR THERAPY: Soliris (Eculizumab) initially with induction 600 MG IV weekly for 4 weeks and then switched to maintenance treatment 900 MG IV every 2 weeks. First dose of treatment was given on 11/21/2014. Status post 45 cycles.  After cycle #45 the patient received his treatment at outside infusion center.  Status post 16 more cycles of treatment.  This was discontinued secondary to worsening symptoms and lack of control of his disease.  CURRENT THERAPY: Ultomiris (Ravulizumab) loading dose 2700 mg/M2 followed by maintenance dose 3300 mg/M2 every 8 weeks.  First dose of treatment September 22, 2018.  He status post the induction dose of the treatment as well as 15 cycles of maintenance therapy.  The first 3 cycles of his treatment were given at the infusion center in [facility].    INTERVAL HISTORY: Travis SARAFIAN 50 y.o. male returns to the clinic for a follow-up visit.He denies any fever, chills, night sweats, weight loss, or fatigue. He states he normally feels bad if his hemoglobin is in the 9's or less. He states he is feeling pretty good today. He denies any chest pain, shortness of breath, cough, or hemoptysis.  He denies any nausea, vomiting, diarrhea, constipation, or abdominal pain.  He denies any headache or visual changes.  He denies any dark urine or jaundice. He is wondering about getting another booster vaccine. He had his 4th booster in June and had COVID-19 in August 2022. He is here today for evaluation and repeat blood work before proceeding with his next cycle of Ultomiris.   MEDICAL HISTORY: Past Medical History:  Diagnosis Date   Cancer ([place])     PNH (Bone marrow)   Diverticulosis    Encounter for antineoplastic chemotherapy 01/15/2016   Hypertension     ALLERGIES:  has No Known Allergies.  MEDICATIONS:  Current Outpatient Medications  Medication Sig Dispense Refill   ALPRAZolam (XANAX) 1 MG tablet Take 1 mg by mouth 3 (three) times daily.  2   aspirin EC 81 MG tablet Take 81 mg by mouth.     lisinopril-hydrochlorothiazide (PRINZIDE,ZESTORETIC) 20-25 MG tablet Take 1 tablet by mouth daily.     meloxicam (MOBIC) 7.5 MG tablet TAKE 1 TABLET(7.5 MG) BY MOUTH DAILY 30 tablet 1   Multiple Vitamins-Minerals (MULTIVITAMIN ADULT PO) Take by mouth.     OVER THE COUNTER MEDICATION Take 1 tablet by mouth daily. OTC potassium     predniSONE (DELTASONE) 10 MG tablet 2 tablet p.o. daily for 1 week and then 1 tablet p.o. daily for 1 week. (Patient not taking: Reported on 10/23/2020) 21 tablet 0   Psyllium (METAMUCIL PO) Take 1 capsule by mouth daily.     tetrahydrozoline 0.05 % ophthalmic solution Place 1 drop into both eyes 2 (two) times daily as needed (eye irritation).     traMADol (ULTRAM) 50 MG tablet Take 50 mg by mouth 2 (two) times daily.     No current facility-administered medications for this visit.    SURGICAL HISTORY: No past surgical history on file.  REVIEW OF SYSTEMS:   Review of Systems  Constitutional: Negative for appetite change, chills, fatigue, fever and unexpected weight change.  HENT: Negative for mouth sores, nosebleeds, sore throat and trouble swallowing.   Eyes:  Negative for eye problems and icterus.  Respiratory: Negative for cough, hemoptysis, shortness of breath and wheezing.   Cardiovascular: Negative for chest pain and leg swelling.  Gastrointestinal: Negative for abdominal pain, constipation, diarrhea, nausea and vomiting.  Genitourinary: Negative for bladder incontinence, difficulty urinating, dysuria, frequency and hematuria.   Musculoskeletal: Negative for back pain, gait problem, neck pain and neck  stiffness.  Skin: Negative for itching and rash.  Neurological: Negative for dizziness, extremity weakness, gait problem, headaches, light-headedness and seizures.  Hematological: Negative for adenopathy. Does not bruise/bleed easily.  Psychiatric/Behavioral: Negative for confusion, depression and sleep disturbance. The patient is not nervous/anxious.     PHYSICAL EXAMINATION:  Blood pressure 134/78, pulse 85, temperature (!) 97 F (36.1 C), temperature source Oral, resp. rate 18, weight 174 lb 1.6 oz (79 kg), SpO2 100 %.  ECOG PERFORMANCE STATUS: 1  Physical Exam  Constitutional: Oriented to person, place, and time and well-developed, well-nourished, and in no distress.  HENT:  Head: Normocephalic and atraumatic.  Mouth/Throat: Oropharynx is clear and moist. No oropharyngeal exudate.  Eyes: Conjunctivae are normal. Right eye exhibits no discharge. Left eye exhibits no discharge. No scleral icterus.  Neck: Normal range of motion. Neck supple.  Cardiovascular: Normal rate, regular rhythm, normal heart sounds and intact distal pulses.   Pulmonary/Chest: Effort normal and breath sounds normal. No respiratory distress. No wheezes. No rales.  Abdominal: Soft. Bowel sounds are normal. Exhibits no distension and no mass. There is no tenderness.  Musculoskeletal: Normal range of motion. Exhibits no edema.  Lymphadenopathy:    No cervical adenopathy.  Neurological: Alert and oriented to person, place, and time. Exhibits normal muscle tone. Gait normal. Coordination normal.  Skin: Skin is warm and dry. No rash noted. Not diaphoretic. No erythema. No pallor.  Psychiatric: Mood, memory and judgment normal.  Vitals reviewed.  LABORATORY DATA: Lab Results  Component Value Date   WBC 4.3 04/16/2021   HGB 11.9 (L) 04/16/2021   HCT 33.8 (L) 04/16/2021   MCV 116.6 (H) 04/16/2021   PLT 142 (L) 04/16/2021      Chemistry      Component Value Date/Time   NA 137 04/16/2021 0739   NA 136  06/28/2017 1236   K 3.8 04/16/2021 0739   K 4.1 06/28/2017 1236   CL 103 04/16/2021 0739   CO2 21 (L) 04/16/2021 0739   CO2 24 06/28/2017 1236   BUN 15 04/16/2021 0739   BUN 17.1 06/28/2017 1236   CREATININE 0.87 04/16/2021 0739   CREATININE 1.0 06/28/2017 1236      Component Value Date/Time   CALCIUM 9.1 04/16/2021 0739   CALCIUM 9.1 06/28/2017 1236   ALKPHOS 58 04/16/2021 0739   ALKPHOS 66 06/28/2017 1236   AST 92 (H) 04/16/2021 0739   AST 40 (H) 06/28/2017 1236   ALT 108 (H) 04/16/2021 0739   ALT 60 (H) 06/28/2017 1236   BILITOT 3.3 (H) 04/16/2021 0739   BILITOT 2.87 (H) 06/28/2017 1236       RADIOGRAPHIC STUDIES:  No results found.   ASSESSMENT/PLAN:  This is a very pleasant 50 year old Caucasian male with paroxysmal nocturnal hemoglobinuria.  He been on treatment with Solaris on days 1 and 15 every 4 weeks.  He is status post 45 cycles.  He received an additional 19 cycles an outside infusion center.  He previously completed a tapering dose of prednisone that was started on September/October 2019 for an episode of hemolytic anemia. His last prednisone taper was in November 2021.  Due to convenience and his previous treatment with Solaris interfering with his work schedule/performance, he is interested in starting Ultomiris.  His insurance requires him to receive treatment from an outside infusion center due to the facility fee here at the cancer center.  His first dose was on May 6th, 2020. status post induction dose followed by 14 cycles of maintenance therapy.  He is currently receiving his infusion of the Ultomiris at the [facility] infusion center. He continues to tolerate his treatment well with no concerning adverse effects.   Labs were reviewed which showed continued stable but persistent low level anemia, elevated bilirubin, and elevated LDH. I reviewed with Dr. Julien Nordmann. The patient is ok to proceed with the next cycle of treatment today.   We will see him back  in 8 weeks for evaluation before starting the next cycle of treatment.   I will reach out to pharmacy about if he qualifies for another COVID-19 vaccine. If he does, he would like for me to arrange for this on Friday 04/18/21  Dr. Gita Kudo at [facility] suggested for the patient to receive anticoagulation but he did not give him any prescription or gave a service recommendation regarding anticoagulation.  He is currently on aspirin daily.  The patient is also concerned about the cost of Eliquis.  The patient was advised to call immediately if she has any concerning symptoms in the interval. The patient voices understanding of current disease status and treatment options and is in agreement with the current care plan. All questions were answered. The patient knows to call the clinic with any problems, questions or concerns. We can certainly see the patient much sooner if necessary   Orders Placed This Encounter  Procedures   CBC with Differential ([facility] Only)    Standing Status:   Future    Standing Expiration Date:   04/16/2022   CMP ([place] only)    Standing Status:   Future    Standing Expiration Date:   04/16/2022   Lactate dehydrogenase (LDH)    Standing Status:   Future    Standing Expiration Date:   04/16/2022      The total time spent in the appointment was 20-29 minutes.   Rahma Meller L Aqil Goetting, [name] 04/16/21

## 2021-04-16 ENCOUNTER — Other Ambulatory Visit: Payer: Self-pay

## 2021-04-16 ENCOUNTER — Inpatient Hospital Stay: Payer: 59

## 2021-04-16 ENCOUNTER — Inpatient Hospital Stay: Payer: 59 | Admitting: Internal Medicine

## 2021-04-16 ENCOUNTER — Inpatient Hospital Stay: Payer: 59 | Admitting: Physician Assistant

## 2021-04-16 ENCOUNTER — Inpatient Hospital Stay: Payer: 59 | Attending: Internal Medicine

## 2021-04-16 VITALS — BP 134/78 | HR 85 | Temp 97.0°F | Resp 18 | Wt 174.1 lb

## 2021-04-16 DIAGNOSIS — Z23 Encounter for immunization: Secondary | ICD-10-CM | POA: Diagnosis not present

## 2021-04-16 DIAGNOSIS — D595 Paroxysmal nocturnal hemoglobinuria [Marchiafava-Micheli]: Secondary | ICD-10-CM | POA: Diagnosis present

## 2021-04-16 LAB — CBC WITH DIFFERENTIAL (CANCER CENTER ONLY)
Abs Immature Granulocytes: 0.02 10*3/uL (ref 0.00–0.07)
Basophils Absolute: 0 10*3/uL (ref 0.0–0.1)
Basophils Relative: 1 %
Eosinophils Absolute: 0.1 10*3/uL (ref 0.0–0.5)
Eosinophils Relative: 3 %
HCT: 33.8 % — ABNORMAL LOW (ref 39.0–52.0)
Hemoglobin: 11.9 g/dL — ABNORMAL LOW (ref 13.0–17.0)
Immature Granulocytes: 1 %
Lymphocytes Relative: 20 %
Lymphs Abs: 0.9 10*3/uL (ref 0.7–4.0)
MCH: 41 pg — ABNORMAL HIGH (ref 26.0–34.0)
MCHC: 35.2 g/dL (ref 30.0–36.0)
MCV: 116.6 fL — ABNORMAL HIGH (ref 80.0–100.0)
Monocytes Absolute: 0.5 10*3/uL (ref 0.1–1.0)
Monocytes Relative: 11 %
Neutro Abs: 2.8 10*3/uL (ref 1.7–7.7)
Neutrophils Relative %: 64 %
Platelet Count: 142 10*3/uL — ABNORMAL LOW (ref 150–400)
RBC: 2.9 MIL/uL — ABNORMAL LOW (ref 4.22–5.81)
RDW: 14.6 % (ref 11.5–15.5)
WBC Count: 4.3 10*3/uL (ref 4.0–10.5)
nRBC: 0 % (ref 0.0–0.2)

## 2021-04-16 LAB — CMP (CANCER CENTER ONLY)
ALT: 108 U/L — ABNORMAL HIGH (ref 0–44)
AST: 92 U/L — ABNORMAL HIGH (ref 15–41)
Albumin: 4.3 g/dL (ref 3.5–5.0)
Alkaline Phosphatase: 58 U/L (ref 38–126)
Anion gap: 13 (ref 5–15)
BUN: 15 mg/dL (ref 6–20)
CO2: 21 mmol/L — ABNORMAL LOW (ref 22–32)
Calcium: 9.1 mg/dL (ref 8.9–10.3)
Chloride: 103 mmol/L (ref 98–111)
Creatinine: 0.87 mg/dL (ref 0.61–1.24)
GFR, Estimated: >60 mL/min (ref >60)
Glucose, Bld: 108 mg/dL — ABNORMAL HIGH (ref 70–99)
Potassium: 3.8 mmol/L (ref 3.5–5.1)
Sodium: 137 mmol/L (ref 135–145)
Total Bilirubin: 3.3 mg/dL — ABNORMAL HIGH (ref 0.3–1.2)
Total Protein: 6.8 g/dL (ref 6.5–8.1)

## 2021-04-16 LAB — LACTATE DEHYDROGENASE: LDH: 263 U/L — ABNORMAL HIGH (ref 98–192)

## 2021-04-25 ENCOUNTER — Inpatient Hospital Stay: Payer: 59

## 2021-04-25 ENCOUNTER — Other Ambulatory Visit: Payer: Self-pay

## 2021-04-25 DIAGNOSIS — D595 Paroxysmal nocturnal hemoglobinuria [Marchiafava-Micheli]: Secondary | ICD-10-CM | POA: Diagnosis not present

## 2021-04-25 DIAGNOSIS — Z23 Encounter for immunization: Secondary | ICD-10-CM

## 2021-04-25 MED ORDER — INFLUENZA VAC SPLIT QUAD 0.5 ML IM SUSY
0.5000 mL | PREFILLED_SYRINGE | Freq: Once | INTRAMUSCULAR | Status: AC
  Administered 2021-04-25: 0.5 mL via INTRAMUSCULAR
  Filled 2021-04-25: qty 0.5

## 2021-04-25 NOTE — Patient Instructions (Signed)
Influenza, Adult °Influenza, also called "the flu," is a viral infection that mainly affects the respiratory tract. This includes the lungs, nose, and throat. The flu spreads easily from person to person (is contagious). It causes common cold symptoms, along with high fever and body aches. °What are the causes? °This condition is caused by the influenza virus. You can get the virus by: °Breathing in droplets that are in the air from an infected person's cough or sneeze. °Touching something that has the virus on it (has been contaminated) and then touching your mouth, nose, or eyes. °What increases the risk? °The following factors may make you more likely to get the flu: °Not washing or sanitizing your hands often. °Having close contact with many people during cold and flu season. °Touching your mouth, eyes, or nose without first washing or sanitizing your hands. °Not getting an annual flu shot. °You may have a higher risk for the flu, including serious problems, such as a lung infection (pneumonia), if you: °Are older than 65. °Are pregnant. °Have a weakened disease-fighting system (immune system). This includes people who have HIV or AIDS, are on chemotherapy, or are taking medicines that reduce (suppress) the immune system. °Have a long-term (chronic) illness, such as heart disease, kidney disease, diabetes, or lung disease. °Have a liver disorder. °Are severely overweight (morbidly obese). °Have anemia. °Have asthma. °What are the signs or symptoms? °Symptoms of this condition usually begin suddenly and last 4-14 days. These may include: °Fever and chills. °Headaches, body aches, or muscle aches. °Sore throat. °Cough. °Runny or stuffy (congested) nose. °Chest discomfort. °Poor appetite. °Weakness or fatigue. °Dizziness. °Nausea or vomiting. °How is this diagnosed? °This condition may be diagnosed based on: °Your symptoms and medical history. °A physical exam. °Swabbing your nose or throat and testing the fluid  for the influenza virus. °How is this treated? °If the flu is diagnosed early, you can be treated with antiviral medicine that is given by mouth (orally) or through an IV. This can help reduce how severe the illness is and how long it lasts. °Taking care of yourself at home can help relieve symptoms. Your health care provider may recommend: °Taking over-the-counter medicines. °Drinking plenty of fluids. °In many cases, the flu goes away on its own. If you have severe symptoms or complications, you may be treated in a hospital. °Follow these instructions at home: °Activity °Rest as needed and get plenty of sleep. °Stay home from work or school as told by your health care provider. Unless you are visiting your health care provider, avoid leaving home until your fever has been gone for 24 hours without taking medicine. °Eating and drinking °Take an oral rehydration solution (ORS). This is a drink that is sold at pharmacies and retail stores. °Drink enough fluid to keep your urine pale yellow. °Drink clear fluids in small amounts as you are able. Clear fluids include water, ice chips, fruit juice mixed with water, and low-calorie sports drinks. °Eat bland, easy-to-digest foods in small amounts as you are able. These foods include bananas, applesauce, rice, lean meats, toast, and crackers. °Avoid drinking fluids that contain a lot of sugar or caffeine, such as energy drinks, regular sports drinks, and soda. °Avoid alcohol. °Avoid spicy or fatty foods. °General instructions °  °Take over-the-counter and prescription medicines only as told by your health care provider. °Use a cool mist humidifier to add humidity to the air in your home. This can make it easier to breathe. °When using a cool mist humidifier,   clean it daily. Empty the water and replace it with clean water. °Cover your mouth and nose when you cough or sneeze. °Wash your hands with soap and water often and for at least 20 seconds, especially after you cough or  sneeze. If soap and water are not available, use alcohol-based hand sanitizer. °Keep all follow-up visits. This is important. °How is this prevented? ° °Get an annual flu shot. This is usually available in late summer, fall, or winter. Ask your health care provider when you should get your flu shot. °Avoid contact with people who are sick during cold and flu season. This is generally fall and winter. °Contact a health care provider if: °You develop new symptoms. °You have: °Chest pain. °Diarrhea. °A fever. °Your cough gets worse. °You produce more mucus. °You feel nauseous or you vomit. °Get help right away if you: °Develop shortness of breath or have difficulty breathing. °Have skin or nails that turn a bluish color. °Have severe pain or stiffness in your neck. °Develop a sudden headache or sudden pain in your face or ear. °Cannot eat or drink without vomiting. °These symptoms may represent a serious problem that is an emergency. Do not wait to see if the symptoms will go away. Get medical help right away. Call your local emergency services (911 in the [place] Do not drive yourself to the hospital. °Summary °Influenza, also called "the flu," is a viral infection that primarily affects your respiratory tract. °Symptoms of the flu usually begin suddenly and last 4-14 days. °Getting an annual flu shot is the best way to prevent getting the flu. °Stay home from work or school as told by your health care provider. Unless you are visiting your health care provider, avoid leaving home until your fever has been gone for 24 hours without taking medicine. °Keep all follow-up visits. This is important. °This information is not intended to replace advice given to you by your health care provider. Make sure you discuss any questions you have with your health care provider. °Document Revised: 02/02/2020 Document Reviewed: 02/02/2020 °Elsevier Patient Education © 2022 [employer] ° °

## 2021-04-29 ENCOUNTER — Other Ambulatory Visit: Payer: Self-pay | Admitting: Nurse Practitioner

## 2021-05-16 ENCOUNTER — Telehealth: Payer: Self-pay | Admitting: Internal Medicine

## 2021-05-16 NOTE — Telephone Encounter (Signed)
Scheduled per sch msg. Called and left msg  

## 2021-05-19 ENCOUNTER — Ambulatory Visit: Payer: 59

## 2021-05-23 ENCOUNTER — Telehealth: Payer: Self-pay | Admitting: Internal Medicine

## 2021-05-23 NOTE — Telephone Encounter (Signed)
Scheduled appointment per 11/23 scheduling message. Patient is aware.

## 2021-05-30 ENCOUNTER — Inpatient Hospital Stay: Payer: 59

## 2021-06-06 ENCOUNTER — Other Ambulatory Visit: Payer: Self-pay

## 2021-06-06 ENCOUNTER — Inpatient Hospital Stay: Payer: 59 | Attending: Internal Medicine

## 2021-06-06 DIAGNOSIS — I10 Essential (primary) hypertension: Secondary | ICD-10-CM | POA: Insufficient documentation

## 2021-06-06 DIAGNOSIS — Z23 Encounter for immunization: Secondary | ICD-10-CM | POA: Diagnosis not present

## 2021-06-06 DIAGNOSIS — D595 Paroxysmal nocturnal hemoglobinuria [Marchiafava-Micheli]: Secondary | ICD-10-CM | POA: Insufficient documentation

## 2021-06-06 NOTE — Progress Notes (Signed)
[facility] OFFICE PROGRESS NOTE  [name], MD [address] 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  PRIOR THERAPY: Soliris (Eculizumab) initially with induction 600 MG IV weekly for 4 weeks and then switched to maintenance treatment 900 MG IV every 2 weeks. First dose of treatment was given on 11/21/2014. Status post 45 cycles.  After cycle #45 the patient received his treatment at outside infusion center.  Status post 16 more cycles of treatment.  This was discontinued secondary to worsening symptoms and lack of control of his disease.  CURRENT THERAPY: Ultomiris (Ravulizumab) loading dose 2700 mg/M2 followed by maintenance dose 3300 mg/M2 every 8 weeks.  First dose of treatment September 22, 2018.  He status post the induction dose of the treatment as well as 15 cycles of maintenance therapy.  The first 3 cycles of his treatment were given at the infusion center in [facility].   INTERVAL HISTORY: Travis Mcdaniel 50 y.o. male returns to the clinic for a follow-up visit. He denies any fever, chills, night sweats, or weight loss. His fatigue is up and down. He states he normally feels bad if his hemoglobin is in the 9's or less. He denies any chest pain, cough, or hemoptysis. He sometimes has dyspnea on exertion. He denies any nausea, vomiting, diarrhea, constipation, or abdominal pain. He denies any visual changes. He sometimes has headaches that comes and goes. He denies any dark urine or jaundice. He estimates that he drinks about 0-4 alcoholic beverages per week which is cut down from daily before. He is here today for evaluation and repeat blood work before proceeding with his next cycle of Ultomiris.   MEDICAL HISTORY: Past Medical History:  Diagnosis Date    Cancer ([place])    PNH (Bone marrow)   Diverticulosis    Encounter for antineoplastic chemotherapy 01/15/2016   Hypertension     ALLERGIES:  has No Known Allergies.  MEDICATIONS:  Current Outpatient Medications  Medication Sig Dispense Refill   ALPRAZolam (XANAX) 1 MG tablet Take 1 mg by mouth 3 (three) times daily.  2   aspirin EC 81 MG tablet Take 81 mg by mouth.     lisinopril-hydrochlorothiazide (PRINZIDE,ZESTORETIC) 20-25 MG tablet Take 1 tablet by mouth daily.     meloxicam (MOBIC) 7.5 MG tablet TAKE 1 TABLET(7.5 MG) BY MOUTH DAILY 30 tablet 1   Multiple Vitamins-Minerals (MULTIVITAMIN ADULT PO) Take by mouth.     OVER THE COUNTER MEDICATION Take 1 tablet by mouth daily. OTC potassium     predniSONE (DELTASONE) 10 MG tablet 2 tablet p.o. daily for 1 week and then 1 tablet p.o. daily for 1 week. (Patient not taking: Reported on 10/23/2020) 21 tablet 0   Psyllium (METAMUCIL PO) Take 1 capsule by mouth daily.     tetrahydrozoline 0.05 % ophthalmic solution Place 1 drop into both eyes 2 (two) times daily as needed (eye irritation).     traMADol (ULTRAM) 50 MG tablet Take 50 mg by mouth 2 (two) times daily.     No current facility-administered medications for this visit.    SURGICAL HISTORY: No past surgical history on file.  REVIEW OF SYSTEMS:   Review of Systems  Constitutional: Positive for intermittent stable fatigue. Negative for appetite change, chills, fever and unexpected weight change.  HENT: Negative for mouth sores, nosebleeds, sore throat and trouble  swallowing.   Eyes: Negative for eye problems and icterus.  Respiratory: Negative for cough, hemoptysis, shortness of breath and wheezing.   Cardiovascular: Negative for chest pain and leg swelling.  Gastrointestinal: Negative for abdominal pain, constipation, diarrhea, nausea and vomiting.  Genitourinary: Negative for bladder incontinence, difficulty urinating, dysuria, frequency and hematuria.   Musculoskeletal: Negative  for back pain, gait problem, neck pain and neck stiffness.  Skin: Negative for itching and rash.  Neurological: Positive for stable intermittent headaches. Negative for dizziness, extremity weakness, gait problem, light-headedness and seizures.  Hematological: Negative for adenopathy. Does not bruise/bleed easily.  Psychiatric/Behavioral: Negative for confusion, depression and sleep disturbance. The patient is not nervous/anxious.     PHYSICAL EXAMINATION:  Blood pressure 118/68, pulse 81, temperature 97.9 F (36.6 C), temperature source Tympanic, resp. rate 19, height 5\' 6"  (1.676 m), weight 174 lb 14.4 oz (79.3 kg), SpO2 100 %.  ECOG PERFORMANCE STATUS: 1  Physical Exam  Constitutional: Oriented to person, place, and time and well-developed, well-nourished, and in no distress.  HENT:  Head: Normocephalic and atraumatic.  Mouth/Throat: Oropharynx is clear and moist. No oropharyngeal exudate.  Eyes: Conjunctivae are normal. Right eye exhibits no discharge. Left eye exhibits no discharge. No scleral icterus.  Neck: Normal range of motion. Neck supple.  Cardiovascular: Normal rate, regular rhythm, normal heart sounds and intact distal pulses.   Pulmonary/Chest: Effort normal and breath sounds normal. No respiratory distress. No wheezes. No rales.  Abdominal: Soft. Bowel sounds are normal. Exhibits no distension and no mass. There is no tenderness.  Musculoskeletal: Normal range of motion. Exhibits no edema.  Lymphadenopathy:    No cervical adenopathy.  Neurological: Alert and oriented to person, place, and time. Exhibits normal muscle tone. Gait normal. Coordination normal.  Skin: Skin is warm and dry. No rash noted. Not diaphoretic. No erythema. No pallor.  Psychiatric: Mood, memory and judgment normal.  Vitals reviewed.  LABORATORY DATA: Lab Results  Component Value Date   WBC 5.5 06/11/2021   HGB 11.0 (L) 06/11/2021   HCT 31.7 (L) 06/11/2021   MCV 114.9 (H) 06/11/2021   PLT 141  (L) 06/11/2021      Chemistry      Component Value Date/Time   NA 139 06/11/2021 0723   NA 136 06/28/2017 1236   K 4.0 06/11/2021 0723   K 4.1 06/28/2017 1236   CL 106 06/11/2021 0723   CO2 23 06/11/2021 0723   CO2 24 06/28/2017 1236   BUN 14 06/11/2021 0723   BUN 17.1 06/28/2017 1236   CREATININE 0.80 06/11/2021 0723   CREATININE 1.0 06/28/2017 1236      Component Value Date/Time   CALCIUM 9.3 06/11/2021 0723   CALCIUM 9.1 06/28/2017 1236   ALKPHOS 58 06/11/2021 0723   ALKPHOS 66 06/28/2017 1236   AST 90 (H) 06/11/2021 0723   AST 40 (H) 06/28/2017 1236   ALT 111 (H) 06/11/2021 0723   ALT 60 (H) 06/28/2017 1236   BILITOT 2.4 (H) 06/11/2021 0723   BILITOT 2.87 (H) 06/28/2017 1236       RADIOGRAPHIC STUDIES:  No results found.   ASSESSMENT/PLAN:  This is a very pleasant 50 year old Caucasian male with paroxysmal nocturnal hemoglobinuria.  He been on treatment with Solaris on days 1 and 15 every 4 weeks.  He is status post 45 cycles.  He received an additional 19 cycles an outside infusion center.  He previously completed a tapering dose of prednisone that was started on September/October 2019 for an episode of  hemolytic anemia. His last prednisone taper was in November 2021.    Due to convenience and his previous treatment with Solaris interfering with his work schedule/performance, he is interested in starting Ultomiris.  His insurance requires him to receive treatment from an outside infusion center due to the facility fee here at the cancer center.  His first dose was on May 6th, 2020. Status post induction dose followed by maintenance therapy.  He is currently receiving his infusion of the Ultomiris at the [facility] infusion center. He continues to tolerate his treatment well with no concerning adverse effects.    Labs were reviewed which showed stable anemia with a hbg of 11.0, elevated bilirubin but improved compared to his last visit at 2.4. His LFTs are mildly  elevated but stable. The patient was seen with Dr. Julien Nordmann today. The patient is ok to proceed with the next cycle of treatment today.    We will see him back in 8 weeks for evaluation before starting the next cycle of treatment.   Dr. Gita Kudo at [facility] previously suggested for the patient to receive anticoagulation but he did not give him any prescription or gave a service recommendation regarding anticoagulation.  He is currently on aspirin daily.  The patient is also concerned about the cost of Eliquis.  Discussed that the patient should be due for a screening colonoscopy since he is 50 years of age. Reviewed he should follow with his PCP to ensure he is up to date on health screenings.   The patient was advised to call immediately if he has any concerning symptoms in the interval. The patient voices understanding of current disease status and treatment options and is in agreement with the current care plan. All questions were answered. The patient knows to call the clinic with any problems, questions or concerns. We can certainly see the patient much sooner if necessary    Orders Placed This Encounter  Procedures   CBC with Differential ([place] Only)    Standing Status:   Future    Standing Expiration Date:   06/11/2022   CMP ([place] only)    Standing Status:   Future    Standing Expiration Date:   06/11/2022   Lactate dehydrogenase (LDH)    Standing Status:   Future    Standing Expiration Date:   06/11/2022      Clarnce Homan L Calieb Lichtman, [name] 06/11/21  ADDENDUM: Hematology/Oncology Attending: I had a face-to-face encounter with the patient today.  I reviewed his record, lab and recommended his care plan.  This is a very pleasant 50 years old white male with history of paroxysmal nocturnal hemoglobinuria (PNH).  The patient is currently on treatment with Ultomiris started Nov 02, 2018 after previous treatment with Solaris.  The patient has been tolerating his treatment  with Ultomiris fairly well with no concerning adverse effects. Blood work today is unremarkable except for the mild anemia and mildly elevated serum bilirubin. I recommended for the patient to continue his treatment with Ultomiris as scheduled. He will come back for follow-up visit in 8 weeks for evaluation before the next cycle of his treatment. The patient was advised to call immediately if he has any other concerning symptoms in the interval.  Disclaimer: This note was dictated with voice recognition software. Similar sounding words can inadvertently be transcribed and may be missed upon review. Eilleen Kempf, MD 06/11/21

## 2021-06-11 ENCOUNTER — Other Ambulatory Visit: Payer: Self-pay

## 2021-06-11 ENCOUNTER — Inpatient Hospital Stay: Payer: 59 | Admitting: Physician Assistant

## 2021-06-11 ENCOUNTER — Inpatient Hospital Stay: Payer: 59

## 2021-06-11 VITALS — BP 118/68 | HR 81 | Temp 97.9°F | Resp 19 | Ht 66.0 in | Wt 174.9 lb

## 2021-06-11 DIAGNOSIS — D595 Paroxysmal nocturnal hemoglobinuria [Marchiafava-Micheli]: Secondary | ICD-10-CM | POA: Diagnosis not present

## 2021-06-11 DIAGNOSIS — I10 Essential (primary) hypertension: Secondary | ICD-10-CM | POA: Diagnosis not present

## 2021-06-11 LAB — CBC WITH DIFFERENTIAL (CANCER CENTER ONLY)
Abs Immature Granulocytes: 0.03 10*3/uL (ref 0.00–0.07)
Basophils Absolute: 0 10*3/uL (ref 0.0–0.1)
Basophils Relative: 1 %
Eosinophils Absolute: 0.2 10*3/uL (ref 0.0–0.5)
Eosinophils Relative: 3 %
HCT: 31.7 % — ABNORMAL LOW (ref 39.0–52.0)
Hemoglobin: 11 g/dL — ABNORMAL LOW (ref 13.0–17.0)
Immature Granulocytes: 1 %
Lymphocytes Relative: 18 %
Lymphs Abs: 1 10*3/uL (ref 0.7–4.0)
MCH: 39.9 pg — ABNORMAL HIGH (ref 26.0–34.0)
MCHC: 34.7 g/dL (ref 30.0–36.0)
MCV: 114.9 fL — ABNORMAL HIGH (ref 80.0–100.0)
Monocytes Absolute: 0.6 10*3/uL (ref 0.1–1.0)
Monocytes Relative: 11 %
Neutro Abs: 3.7 10*3/uL (ref 1.7–7.7)
Neutrophils Relative %: 66 %
Platelet Count: 141 10*3/uL — ABNORMAL LOW (ref 150–400)
RBC: 2.76 MIL/uL — ABNORMAL LOW (ref 4.22–5.81)
RDW: 13.5 % (ref 11.5–15.5)
WBC Count: 5.5 10*3/uL (ref 4.0–10.5)
nRBC: 0.5 % — ABNORMAL HIGH (ref 0.0–0.2)

## 2021-06-11 LAB — CMP (CANCER CENTER ONLY)
ALT: 111 U/L — ABNORMAL HIGH (ref 0–44)
AST: 90 U/L — ABNORMAL HIGH (ref 15–41)
Albumin: 4.3 g/dL (ref 3.5–5.0)
Alkaline Phosphatase: 58 U/L (ref 38–126)
Anion gap: 10 (ref 5–15)
BUN: 14 mg/dL (ref 6–20)
CO2: 23 mmol/L (ref 22–32)
Calcium: 9.3 mg/dL (ref 8.9–10.3)
Chloride: 106 mmol/L (ref 98–111)
Creatinine: 0.8 mg/dL (ref 0.61–1.24)
GFR, Estimated: >60 mL/min (ref >60)
Glucose, Bld: 91 mg/dL (ref 70–99)
Potassium: 4 mmol/L (ref 3.5–5.1)
Sodium: 139 mmol/L (ref 135–145)
Total Bilirubin: 2.4 mg/dL — ABNORMAL HIGH (ref 0.3–1.2)
Total Protein: 7.1 g/dL (ref 6.5–8.1)

## 2021-06-11 LAB — LACTATE DEHYDROGENASE: LDH: 286 U/L — ABNORMAL HIGH (ref 98–192)

## 2021-08-06 ENCOUNTER — Inpatient Hospital Stay: Payer: 59 | Attending: Internal Medicine

## 2021-08-06 ENCOUNTER — Inpatient Hospital Stay (HOSPITAL_BASED_OUTPATIENT_CLINIC_OR_DEPARTMENT_OTHER): Payer: 59 | Admitting: Internal Medicine

## 2021-08-06 ENCOUNTER — Other Ambulatory Visit: Payer: Self-pay

## 2021-08-06 VITALS — BP 113/69 | HR 77 | Temp 97.6°F | Resp 17 | Ht 66.0 in | Wt 175.1 lb

## 2021-08-06 DIAGNOSIS — Z79899 Other long term (current) drug therapy: Secondary | ICD-10-CM | POA: Insufficient documentation

## 2021-08-06 DIAGNOSIS — D595 Paroxysmal nocturnal hemoglobinuria [Marchiafava-Micheli]: Secondary | ICD-10-CM | POA: Diagnosis not present

## 2021-08-06 DIAGNOSIS — D696 Thrombocytopenia, unspecified: Secondary | ICD-10-CM | POA: Insufficient documentation

## 2021-08-06 DIAGNOSIS — Z5111 Encounter for antineoplastic chemotherapy: Secondary | ICD-10-CM

## 2021-08-06 DIAGNOSIS — Z7982 Long term (current) use of aspirin: Secondary | ICD-10-CM | POA: Insufficient documentation

## 2021-08-06 LAB — CBC WITH DIFFERENTIAL (CANCER CENTER ONLY)
Abs Immature Granulocytes: 0.02 10*3/uL (ref 0.00–0.07)
Basophils Absolute: 0 10*3/uL (ref 0.0–0.1)
Basophils Relative: 1 %
Eosinophils Absolute: 0.1 10*3/uL (ref 0.0–0.5)
Eosinophils Relative: 2 %
HCT: 36.9 % — ABNORMAL LOW (ref 39.0–52.0)
Hemoglobin: 13.1 g/dL (ref 13.0–17.0)
Immature Granulocytes: 0 %
Lymphocytes Relative: 15 %
Lymphs Abs: 0.7 10*3/uL (ref 0.7–4.0)
MCH: 39.3 pg — ABNORMAL HIGH (ref 26.0–34.0)
MCHC: 35.5 g/dL (ref 30.0–36.0)
MCV: 110.8 fL — ABNORMAL HIGH (ref 80.0–100.0)
Monocytes Absolute: 0.5 10*3/uL (ref 0.1–1.0)
Monocytes Relative: 10 %
Neutro Abs: 3.4 10*3/uL (ref 1.7–7.7)
Neutrophils Relative %: 72 %
Platelet Count: 119 10*3/uL — ABNORMAL LOW (ref 150–400)
RBC: 3.33 MIL/uL — ABNORMAL LOW (ref 4.22–5.81)
RDW: 12.5 % (ref 11.5–15.5)
WBC Count: 4.7 10*3/uL (ref 4.0–10.5)
nRBC: 0 % (ref 0.0–0.2)

## 2021-08-06 LAB — CMP (CANCER CENTER ONLY)
ALT: 76 U/L — ABNORMAL HIGH (ref 0–44)
AST: 70 U/L — ABNORMAL HIGH (ref 15–41)
Albumin: 4.6 g/dL (ref 3.5–5.0)
Alkaline Phosphatase: 51 U/L (ref 38–126)
Anion gap: 8 (ref 5–15)
BUN: 21 mg/dL — ABNORMAL HIGH (ref 6–20)
CO2: 26 mmol/L (ref 22–32)
Calcium: 9.2 mg/dL (ref 8.9–10.3)
Chloride: 103 mmol/L (ref 98–111)
Creatinine: 0.85 mg/dL (ref 0.61–1.24)
GFR, Estimated: >60 mL/min (ref >60)
Glucose, Bld: 98 mg/dL (ref 70–99)
Potassium: 3.8 mmol/L (ref 3.5–5.1)
Sodium: 137 mmol/L (ref 135–145)
Total Bilirubin: 2.5 mg/dL — ABNORMAL HIGH (ref 0.3–1.2)
Total Protein: 7 g/dL (ref 6.5–8.1)

## 2021-08-06 LAB — LACTATE DEHYDROGENASE: LDH: 181 U/L (ref 98–192)

## 2021-08-06 NOTE — Progress Notes (Signed)
[place] Telephone:([phone]) [phone]   Fax:([phone]) [phone]  OFFICE PROGRESS NOTE  [name], MD [address] 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  PRIOR THERAPY:  Soliris (Eculizumab) initially with induction 600 MG IV weekly for 4 weeks and then switched to maintenance treatment 900 MG IV every 2 weeks. First dose of treatment was given on 11/21/2014. Status post 45 cycles.  After cycle #45 the patient received his treatment at outside infusion center.  Status post 16 more cycles of treatment.  This was discontinued secondary to worsening symptoms and lack of control of his disease.  CURRENT THERAPY: Ultomiris (Ravulizumab) loading dose 2700 mg/M2 followed by maintenance dose 3300 mg/M2 every 8 weeks.  First dose of treatment September 22, 2018.  He status post the induction dose of the treatment as well as 16 cycles of maintenance therapy.  The first 3 cycles of his treatment were given at the infusion center in [facility].   INTERVAL HISTORY: Travis Mcdaniel 51 y.o. male returns to the clinic today for follow-up visit.  The patient is feeling fine today with no concerning complaints.  He denied having any current chest pain, shortness of breath, cough or hemoptysis.  He denied having any fever or chills.  He has no nausea, vomiting, diarrhea or constipation.  He has no headache or visual changes.  He gained few pounds recently.  He is here today for evaluation and repeat blood work before cycle #16 of his maintenance treatment with Ultomiris.   MEDICAL HISTORY: Past Medical History:  Diagnosis Date   Cancer ([place])    PNH (Bone marrow)   Diverticulosis    Encounter for antineoplastic chemotherapy 01/15/2016   Hypertension     ALLERGIES:  has No Known Allergies.  MEDICATIONS:  Current Outpatient Medications  Medication Sig Dispense Refill    ALPRAZolam (XANAX) 1 MG tablet Take 1 mg by mouth 3 (three) times daily.  2   aspirin EC 81 MG tablet Take 81 mg by mouth.     lisinopril-hydrochlorothiazide (PRINZIDE,ZESTORETIC) 20-25 MG tablet Take 1 tablet by mouth daily.     meloxicam (MOBIC) 7.5 MG tablet TAKE 1 TABLET(7.5 MG) BY MOUTH DAILY 30 tablet 1   Multiple Vitamins-Minerals (MULTIVITAMIN ADULT PO) Take by mouth.     OVER THE COUNTER MEDICATION Take 1 tablet by mouth daily. OTC potassium     predniSONE (DELTASONE) 10 MG tablet 2 tablet p.o. daily for 1 week and then 1 tablet p.o. daily for 1 week. (Patient not taking: Reported on 10/23/2020) 21 tablet 0   Psyllium (METAMUCIL PO) Take 1 capsule by mouth daily.     tetrahydrozoline 0.05 % ophthalmic solution Place 1 drop into both eyes 2 (two) times daily as needed (eye irritation).     traMADol (ULTRAM) 50 MG tablet Take 50 mg by mouth 2 (two) times daily.     No current facility-administered medications for this visit.    SURGICAL HISTORY: No past surgical history on file.  REVIEW OF SYSTEMS:  A comprehensive review of systems was negative.   PHYSICAL EXAMINATION: General appearance: alert, cooperative, and no distress Head: Normocephalic, without obvious abnormality, atraumatic Neck: no adenopathy, no JVD, supple, symmetrical, trachea midline, and thyroid not enlarged, symmetric, no tenderness/mass/nodules Lymph nodes: Cervical, supraclavicular, and axillary nodes normal. Resp: clear to auscultation bilaterally Back: symmetric, no curvature. ROM normal. No CVA tenderness. Cardio: regular rate and  rhythm, S1, S2 normal, no murmur, click, rub or gallop GI: soft, non-tender; bowel sounds normal; no masses,  no organomegaly Extremities: extremities normal, atraumatic, no cyanosis or edema  ECOG PERFORMANCE STATUS: 1 - Symptomatic but completely ambulatory  Blood pressure 113/69, pulse 77, temperature 97.6 F (36.4 C), temperature source Axillary, resp. rate 17, height 5\' 6"   (1.676 m), weight 175 lb 1.6 oz (79.4 kg), SpO2 100 %.  LABORATORY DATA: Lab Results  Component Value Date   WBC 4.7 08/06/2021   HGB 13.1 08/06/2021   HCT 36.9 (L) 08/06/2021   MCV 110.8 (H) 08/06/2021   PLT 119 (L) 08/06/2021      Chemistry      Component Value Date/Time   NA 139 06/11/2021 0723   NA 136 06/28/2017 1236   K 4.0 06/11/2021 0723   K 4.1 06/28/2017 1236   CL 106 06/11/2021 0723   CO2 23 06/11/2021 0723   CO2 24 06/28/2017 1236   BUN 14 06/11/2021 0723   BUN 17.1 06/28/2017 1236   CREATININE 0.80 06/11/2021 0723   CREATININE 1.0 06/28/2017 1236      Component Value Date/Time   CALCIUM 9.3 06/11/2021 0723   CALCIUM 9.1 06/28/2017 1236   ALKPHOS 58 06/11/2021 0723   ALKPHOS 66 06/28/2017 1236   AST 90 (H) 06/11/2021 0723   AST 40 (H) 06/28/2017 1236   ALT 111 (H) 06/11/2021 0723   ALT 60 (H) 06/28/2017 1236   BILITOT 2.4 (H) 06/11/2021 0723   BILITOT 2.87 (H) 06/28/2017 1236       RADIOGRAPHIC STUDIES: No results found.  ASSESSMENT AND PLAN:  This is a very pleasant 51 years old white male with paroxysmal nocturnal hemoglobinuria and currently on treatment with Soliris on days 1 and 15 every 4 weeks status post 45 cycles.  He is also on the same treatment received at outside infusion center status post 19 more cycles. He also completed a course of tapered dose of prednisone. He is currently on treatment with Ultomiris status post induction dose followed by 15 cycles of maintenance therapy.  He is currently receiving his infusion of the Ultomiris at the [facility] infusion center. He continues to tolerate his treatment fairly well with no concerning adverse effects. CBC today showed normal white blood count, hemoglobin and hematocrit but low platelets.  These parameters are good for treatment.  Comprehensive metabolic panel and LDH are still pending. I recommended for the patient to proceed with cycle #16 today as planned. I will see him back for  follow-up visit in 8 weeks for evaluation and repeat blood work before starting cycle #17. Dr. Gita Kudo at [facility] suggested for the patient to receive anticoagulation but he did not give him any prescription or gave a service recommendation regarding anticoagulation.  He is currently on aspirin daily.  The patient is also concerned about the cost of Eliquis. The patient was advised to call immediately if he has any other concerning symptoms in the interval. The patient voices understanding of current disease status and treatment options and is in agreement with the current care plan. All questions were answered. The patient knows to call the clinic with any problems, questions or concerns. We can certainly see the patient much sooner if necessary.  Disclaimer: This note was dictated with voice recognition software. Similar sounding words can inadvertently be transcribed and may not be corrected upon review.

## 2021-09-29 NOTE — Progress Notes (Signed)
[place] ?OFFICE PROGRESS NOTE ? ?[name], MD ?[address] ?[place] [address] ? 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 ? ?PRIOR THERAPY: Soliris (Eculizumab) initially with induction 600 MG IV weekly for 4 weeks and then switched to maintenance treatment 900 MG IV every 2 weeks. First dose of treatment was given on 11/21/2014. Status post 45 cycles.  After cycle #45 the patient received his treatment at outside infusion center.  Status post 16 more cycles of treatment.  This was discontinued secondary to worsening symptoms and lack of control of his disease. ? ?CURRENT THERAPY: Ultomiris (Ravulizumab) loading dose 2700 mg/M2 followed by maintenance dose 3300 mg/M2 every 8 weeks.  First dose of treatment September 22, 2018.  He status post the induction dose of the treatment as well as 17 cycles of maintenance therapy.  The first 3 cycles of his treatment were given at the infusion center in [facility].  ? ?INTERVAL HISTORY: ?Travis Mcdaniel 51 y.o. male returns to the clinic for a follow-up visit. He denies any fever, chills, night sweats, or weight loss. He notes some fatigue especially the last week. He notes some arthritis. He also notes some nausea.  He has not taken an antiemetic.  He lost his job because he misses too many days of work. He is wondering if he can be on prednisone continuously. He reports some dyspnea on exertion.  He denies any chest pain, cough, or hemoptysis. He denies any vomiting, diarrhea, constipation, or abdominal pain.  He denies any dark urine or jaundice. He estimates that he drinks about alcoholic beverages only on the weekends.  He is compliant with his folic acid and iron supplement.  He saw Dr. Gita Kudo at another practice who recommended anticoagulation due to risk  of blood clots.  He is on 81 mg aspirin but Xarelto and Eliquis was too expensive for him.  He is here today for evaluation and repeat blood work before proceeding with his next cycle of Ultomiris.  ? ?MEDICAL HISTORY: ?Past Medical History:  ?Diagnosis Date  ? Cancer [facility])   ? PNH (Bone marrow)  ? Diverticulosis   ? Encounter for antineoplastic chemotherapy 01/15/2016  ? Hypertension   ? ? ?ALLERGIES:  has No Known Allergies. ? ?MEDICATIONS:  ?Current Outpatient Medications  ?Medication Sig Dispense Refill  ? apixaban (ELIQUIS) 2.5 MG TABS tablet Take 1 tablet (2.5 mg total) by mouth 2 (two) times daily. 60 tablet 2  ? ALPRAZolam (XANAX) 1 MG tablet Take 1 mg by mouth 3 (three) times daily.  2  ? aspirin EC 81 MG tablet Take 81 mg by mouth.    ? lisinopril-hydrochlorothiazide (PRINZIDE,ZESTORETIC) 20-25 MG tablet Take 1 tablet by mouth daily.    ? Multiple Vitamins-Minerals (MULTIVITAMIN ADULT PO) Take by mouth.    ? OVER THE COUNTER MEDICATION Take 1 tablet by mouth daily. OTC potassium    ? predniSONE (DELTASONE) 10 MG tablet 2 tablet p.o. daily for 1 week and then 1 tablet p.o. daily for 1 week. 21 tablet 0  ? Psyllium (METAMUCIL PO) Take 1 capsule by mouth daily.    ? tetrahydrozoline 0.05 % ophthalmic solution Place 1 drop into both eyes 2 (two) times daily as needed (eye irritation).    ? traMADol (ULTRAM) 50 MG tablet Take 50 mg by mouth 2 (two) times daily.    ? ?No current  facility-administered medications for this visit.  ? ? ?SURGICAL HISTORY: No past surgical history on file. ? ?REVIEW OF SYSTEMS:   ?Review of Systems  ?Constitutional: Positive for fatigue.  Negative for appetite change, chills, fever and unexpected weight change.  ?HENT:   Negative for mouth sores, nosebleeds, sore throat and trouble swallowing.   ?Eyes: Negative for eye problems and icterus.  ?Respiratory: Positive for occasional dyspnea on exertion.  Negative for cough, hemoptysis, and wheezing.   ?Cardiovascular: Negative for chest  pain and leg swelling.  ?Gastrointestinal: Negative for abdominal pain, constipation, diarrhea, nausea and vomiting.  ?Genitourinary: Negative for bladder incontinence, difficulty urinating, dysuria, frequency and hematuria.   ?Musculoskeletal: Positive for arthritis in lower extremities.  Negative for back pain, gait problem, neck pain and neck stiffness.  ?Skin: Negative for itching and rash.  ?Neurological: Negative for dizziness, extremity weakness, gait problem, headaches, light-headedness and seizures.  ?Hematological: Negative for adenopathy. Does not bruise/bleed easily.  ?Psychiatric/Behavioral: Negative for confusion, depression and sleep disturbance. The patient is not nervous/anxious.   ? ? ?PHYSICAL EXAMINATION:  ?Blood pressure 116/72, pulse 70, temperature 97.8 ?F (36.6 ?C), temperature source Temporal, resp. rate 17, height '5\' 6"'$  (1.676 m), weight 171 lb (77.6 kg), SpO2 100 %. ? ?ECOG PERFORMANCE STATUS: 1 ? ?Physical Exam  ?Constitutional: Oriented to person, place, and time and well-developed, well-nourished, and in no distress.  ?HENT:  ?Head: Normocephalic and atraumatic.  ?Mouth/Throat: Oropharynx is clear and moist. No oropharyngeal exudate.  ?Eyes: Conjunctivae are normal. Right eye exhibits no discharge. Left eye exhibits no discharge. No scleral icterus.  ?Neck: Normal range of motion. Neck supple.  ?Cardiovascular: Normal rate, regular rhythm, normal heart sounds and intact distal pulses.   ?Pulmonary/Chest: Effort normal and breath sounds normal. No respiratory distress. No wheezes. No rales.  ?Abdominal: Soft. Bowel sounds are normal. Exhibits no distension and no mass. There is no tenderness.  ?Musculoskeletal: Normal range of motion. Exhibits no edema.  ?Lymphadenopathy:  ?  No cervical adenopathy.  ?Neurological: Alert and oriented to person, place, and time. Exhibits normal muscle tone. Gait normal. Coordination normal.  ?Skin: Skin is warm and dry. No rash noted. Not diaphoretic. No  erythema. No pallor.  ?Psychiatric: Mood, memory and judgment normal.  ?Vitals reviewed. ? ?LABORATORY DATA: ?Lab Results  ?Component Value Date  ? WBC 4.4 10/01/2021  ? HGB 10.9 (L) 10/01/2021  ? HCT 31.8 (L) 10/01/2021  ? MCV 116.9 (H) 10/01/2021  ? PLT 151 10/01/2021  ? ? ?  Chemistry   ?   ?Component Value Date/Time  ? NA 136 10/01/2021 0735  ? NA 136 06/28/2017 1236  ? K 3.8 10/01/2021 0735  ? K 4.1 06/28/2017 1236  ? CL 103 10/01/2021 0735  ? CO2 24 10/01/2021 0735  ? CO2 24 06/28/2017 1236  ? BUN 12 10/01/2021 0735  ? BUN 17.1 06/28/2017 1236  ? CREATININE 0.96 10/01/2021 0735  ? CREATININE 1.0 06/28/2017 1236  ?    ?Component Value Date/Time  ? CALCIUM 8.8 (L) 10/01/2021 0735  ? CALCIUM 9.1 06/28/2017 1236  ? ALKPHOS 59 10/01/2021 0735  ? ALKPHOS 66 06/28/2017 1236  ? AST 72 (H) 10/01/2021 0735  ? AST 40 (H) 06/28/2017 1236  ? ALT 96 (H) 10/01/2021 0735  ? ALT 60 (H) 06/28/2017 1236  ? BILITOT 2.7 (H) 10/01/2021 0735  ? BILITOT 2.87 (H) 06/28/2017 1236  ?  ? ? ? ?RADIOGRAPHIC STUDIES: ? ?No results found. ? ? ?ASSESSMENT/PLAN:  ?This is a very pleasant  51 year old Caucasian male with paroxysmal nocturnal hemoglobinuria.  He been on treatment with Solaris on days 1 and 15 every 4 weeks.  He is status post 45 cycles.  He received an additional 19 cycles an outside infusion center.  He previously completed a tapering dose of prednisone that was started on September/October 2019 for an episode of hemolytic anemia. His last prednisone taper was in November 2021.  ?  ?Due to convenience and his previous treatment with Solaris interfering with his work schedule/performance, he is interested in starting Ultomiris.  His insurance requires him to receive treatment from an outside infusion center due to the facility fee here at the cancer center.  His first dose was on May 6th, 2020. Status post induction dose followed by maintenance therapy.  He is currently receiving his infusion of the Ultomiris at the [facility]  infusion center. ?He continues to tolerate his treatment well with no concerning adverse effects. ? ? Labs were reviewed which showed anemia with a hbg of 10.9, elevated but fairly stable/slightly incre

## 2021-10-01 ENCOUNTER — Inpatient Hospital Stay (HOSPITAL_BASED_OUTPATIENT_CLINIC_OR_DEPARTMENT_OTHER): Payer: 59 | Admitting: Physician Assistant

## 2021-10-01 ENCOUNTER — Telehealth: Payer: Self-pay

## 2021-10-01 ENCOUNTER — Other Ambulatory Visit: Payer: Self-pay

## 2021-10-01 ENCOUNTER — Inpatient Hospital Stay: Payer: 59 | Attending: Internal Medicine

## 2021-10-01 VITALS — BP 116/72 | HR 70 | Temp 97.8°F | Resp 17 | Ht 66.0 in | Wt 171.0 lb

## 2021-10-01 DIAGNOSIS — D595 Paroxysmal nocturnal hemoglobinuria [Marchiafava-Micheli]: Secondary | ICD-10-CM | POA: Diagnosis not present

## 2021-10-01 LAB — CBC WITH DIFFERENTIAL (CANCER CENTER ONLY)
Abs Immature Granulocytes: 0.02 10*3/uL (ref 0.00–0.07)
Basophils Absolute: 0 10*3/uL (ref 0.0–0.1)
Basophils Relative: 1 %
Eosinophils Absolute: 0.1 10*3/uL (ref 0.0–0.5)
Eosinophils Relative: 2 %
HCT: 31.8 % — ABNORMAL LOW (ref 39.0–52.0)
Hemoglobin: 10.9 g/dL — ABNORMAL LOW (ref 13.0–17.0)
Immature Granulocytes: 1 %
Lymphocytes Relative: 22 %
Lymphs Abs: 1 10*3/uL (ref 0.7–4.0)
MCH: 40.1 pg — ABNORMAL HIGH (ref 26.0–34.0)
MCHC: 34.3 g/dL (ref 30.0–36.0)
MCV: 116.9 fL — ABNORMAL HIGH (ref 80.0–100.0)
Monocytes Absolute: 0.5 10*3/uL (ref 0.1–1.0)
Monocytes Relative: 10 %
Neutro Abs: 2.8 10*3/uL (ref 1.7–7.7)
Neutrophils Relative %: 64 %
Platelet Count: 151 10*3/uL (ref 150–400)
RBC: 2.72 MIL/uL — ABNORMAL LOW (ref 4.22–5.81)
RDW: 15.9 % — ABNORMAL HIGH (ref 11.5–15.5)
WBC Count: 4.4 10*3/uL (ref 4.0–10.5)
nRBC: 0 % (ref 0.0–0.2)

## 2021-10-01 LAB — CMP (CANCER CENTER ONLY)
ALT: 96 U/L — ABNORMAL HIGH (ref 0–44)
AST: 72 U/L — ABNORMAL HIGH (ref 15–41)
Albumin: 4.4 g/dL (ref 3.5–5.0)
Alkaline Phosphatase: 59 U/L (ref 38–126)
Anion gap: 9 (ref 5–15)
BUN: 12 mg/dL (ref 6–20)
CO2: 24 mmol/L (ref 22–32)
Calcium: 8.8 mg/dL — ABNORMAL LOW (ref 8.9–10.3)
Chloride: 103 mmol/L (ref 98–111)
Creatinine: 0.96 mg/dL (ref 0.61–1.24)
GFR, Estimated: >60 mL/min (ref >60)
Glucose, Bld: 107 mg/dL — ABNORMAL HIGH (ref 70–99)
Potassium: 3.8 mmol/L (ref 3.5–5.1)
Sodium: 136 mmol/L (ref 135–145)
Total Bilirubin: 2.7 mg/dL — ABNORMAL HIGH (ref 0.3–1.2)
Total Protein: 6.8 g/dL (ref 6.5–8.1)

## 2021-10-01 LAB — LACTATE DEHYDROGENASE: LDH: 219 U/L — ABNORMAL HIGH (ref 98–192)

## 2021-10-01 MED ORDER — APIXABAN 2.5 MG PO TABS: 2.5000 mg | ORAL_TABLET | Freq: Two times a day (BID) | ORAL | 2 refills | Status: DC

## 2021-10-01 NOTE — Telephone Encounter (Signed)
I have called the pt and advised his LDH is 219 and Dr. Julien Nordmann has reviewed this result and does not recommend any rx at this time. ? ?Additionally, I have confirmed with the pt he has not picked up his Eliquis but when he does, he understands he will need to stop the 81 mg aspirin.  ? ?Pt expressed that he has financial harship and is not sure he will be able to afford the Eliquis. I advised the pt of the prescription assistance program offered by [facility] and would send him the link and phone number to request their $10 copay card. Pt was very excited for this information and advised he would contact them. ?

## 2021-10-02 ENCOUNTER — Ambulatory Visit: Payer: 59 | Admitting: Internal Medicine

## 2021-10-02 ENCOUNTER — Other Ambulatory Visit: Payer: 59

## 2021-10-17 ENCOUNTER — Other Ambulatory Visit: Payer: Self-pay | Admitting: Nurse Practitioner

## 2021-10-24 ENCOUNTER — Telehealth: Payer: Self-pay | Admitting: Medical Oncology

## 2021-10-24 NOTE — Telephone Encounter (Signed)
?  His next Ultomiris infusion is May 31 at 0930. ? ?Requests to move up appts with Cassie or Mohamed on May 31 to coincide with his next infusion instead of June 8th. ?

## 2021-10-27 ENCOUNTER — Telehealth: Payer: Self-pay | Admitting: Internal Medicine

## 2021-10-27 NOTE — Telephone Encounter (Signed)
Called pt to sch but unable to leave voicemail; original note an error ?

## 2021-10-27 NOTE — Telephone Encounter (Signed)
.  Called patient to schedule appointment per 5/1 inbasket, patient is aware of date and time.   ?

## 2021-10-30 ENCOUNTER — Inpatient Hospital Stay: Payer: BC Managed Care – PPO

## 2021-10-31 ENCOUNTER — Inpatient Hospital Stay: Payer: BC Managed Care – PPO

## 2021-11-03 ENCOUNTER — Telehealth: Payer: Self-pay | Admitting: Medical Oncology

## 2021-11-03 NOTE — Telephone Encounter (Signed)
New [employer] insurance card sent to be scanned to chart and faxed to [facility]. ?

## 2021-11-03 NOTE — Telephone Encounter (Signed)
I LVM for Judd to call [facility] with his new insurance and to bring [place] a copy. ?

## 2021-11-12 ENCOUNTER — Other Ambulatory Visit: Payer: Self-pay | Admitting: Internal Medicine

## 2021-11-12 ENCOUNTER — Telehealth: Payer: Self-pay | Admitting: Internal Medicine

## 2021-11-12 NOTE — Progress Notes (Signed)
OFF PATHWAY REGIMEN - Other Dx ? ?No Change  Continue With Treatment as Ordered. ? ?Original Decision Date/Time: 09/09/2018 10:36 ? ? ?[id number]:Eculizumab Loading Dose (PNH): ?  Loading dose during first 5 weeks of treatment: ?    Eculizumab  ?    Eculizumab  ? ? ?[id number]:Eculizumab Maintenance every 14 days (PNH; **2 cycles per order sheet**): ?  A cycle is every 14 days: ?    Eculizumab  ? ?**Always confirm dose/schedule in your pharmacy ordering system** ? ?Patient Characteristics: ?Intent of Therapy: ?Non-Curative / Palliative Intent, Discussed with Patient ?

## 2021-11-12 NOTE — Telephone Encounter (Signed)
.  Called patient to schedule appointment per 5/17 inbasket, patient is aware of date and time.   ?

## 2021-11-13 ENCOUNTER — Telehealth: Payer: Self-pay | Admitting: Medical Oncology

## 2021-11-13 NOTE — Telephone Encounter (Addendum)
I LVM that pt insurance authorized his  Ultomris to be given here.  Schedule message sent and pt notified.

## 2021-11-26 ENCOUNTER — Inpatient Hospital Stay: Payer: BC Managed Care – PPO

## 2021-11-26 ENCOUNTER — Ambulatory Visit: Payer: 59 | Admitting: Internal Medicine

## 2021-11-26 ENCOUNTER — Inpatient Hospital Stay: Payer: BC Managed Care – PPO | Admitting: Internal Medicine

## 2021-11-26 ENCOUNTER — Other Ambulatory Visit: Payer: 59

## 2021-11-27 ENCOUNTER — Inpatient Hospital Stay: Payer: BC Managed Care – PPO | Attending: Internal Medicine

## 2021-11-27 ENCOUNTER — Ambulatory Visit: Payer: Self-pay | Admitting: Internal Medicine

## 2021-11-27 ENCOUNTER — Telehealth: Payer: Self-pay | Admitting: Medical Oncology

## 2021-11-27 ENCOUNTER — Telehealth: Payer: Self-pay

## 2021-11-27 ENCOUNTER — Other Ambulatory Visit: Payer: Self-pay

## 2021-11-27 ENCOUNTER — Inpatient Hospital Stay: Payer: BC Managed Care – PPO

## 2021-11-27 ENCOUNTER — Inpatient Hospital Stay (HOSPITAL_BASED_OUTPATIENT_CLINIC_OR_DEPARTMENT_OTHER): Payer: BC Managed Care – PPO | Admitting: Internal Medicine

## 2021-11-27 ENCOUNTER — Other Ambulatory Visit: Payer: Self-pay | Admitting: Internal Medicine

## 2021-11-27 VITALS — BP 110/62 | HR 68 | Temp 97.7°F | Resp 18

## 2021-11-27 VITALS — BP 115/65 | HR 78 | Temp 97.1°F | Resp 18 | Wt 169.4 lb

## 2021-11-27 DIAGNOSIS — D595 Paroxysmal nocturnal hemoglobinuria [Marchiafava-Micheli]: Secondary | ICD-10-CM

## 2021-11-27 DIAGNOSIS — R7402 Elevation of levels of lactic acid dehydrogenase (LDH): Secondary | ICD-10-CM | POA: Insufficient documentation

## 2021-11-27 DIAGNOSIS — Z5111 Encounter for antineoplastic chemotherapy: Secondary | ICD-10-CM | POA: Diagnosis not present

## 2021-11-27 LAB — CBC WITH DIFFERENTIAL (CANCER CENTER ONLY)
Abs Immature Granulocytes: 0.02 10*3/uL (ref 0.00–0.07)
Basophils Absolute: 0 10*3/uL (ref 0.0–0.1)
Basophils Relative: 0 %
Eosinophils Absolute: 0.1 10*3/uL (ref 0.0–0.5)
Eosinophils Relative: 1 %
HCT: 32.4 % — ABNORMAL LOW (ref 39.0–52.0)
Hemoglobin: 11.6 g/dL — ABNORMAL LOW (ref 13.0–17.0)
Immature Granulocytes: 0 %
Lymphocytes Relative: 16 %
Lymphs Abs: 0.9 10*3/uL (ref 0.7–4.0)
MCH: 41 pg — ABNORMAL HIGH (ref 26.0–34.0)
MCHC: 35.8 g/dL (ref 30.0–36.0)
MCV: 114.5 fL — ABNORMAL HIGH (ref 80.0–100.0)
Monocytes Absolute: 0.6 10*3/uL (ref 0.1–1.0)
Monocytes Relative: 11 %
Neutro Abs: 4.1 10*3/uL (ref 1.7–7.7)
Neutrophils Relative %: 72 %
Platelet Count: 137 10*3/uL — ABNORMAL LOW (ref 150–400)
RBC: 2.83 MIL/uL — ABNORMAL LOW (ref 4.22–5.81)
RDW: 14.5 % (ref 11.5–15.5)
WBC Count: 5.7 10*3/uL (ref 4.0–10.5)
nRBC: 0 % (ref 0.0–0.2)

## 2021-11-27 LAB — CMP (CANCER CENTER ONLY)
ALT: 84 U/L — ABNORMAL HIGH (ref 0–44)
AST: 48 U/L — ABNORMAL HIGH (ref 15–41)
Albumin: 4.7 g/dL (ref 3.5–5.0)
Alkaline Phosphatase: 59 U/L (ref 38–126)
Anion gap: 9 (ref 5–15)
BUN: 18 mg/dL (ref 6–20)
CO2: 26 mmol/L (ref 22–32)
Calcium: 9.7 mg/dL (ref 8.9–10.3)
Chloride: 101 mmol/L (ref 98–111)
Creatinine: 1.1 mg/dL (ref 0.61–1.24)
GFR, Estimated: >60 mL/min (ref >60)
Glucose, Bld: 102 mg/dL — ABNORMAL HIGH (ref 70–99)
Potassium: 3.8 mmol/L (ref 3.5–5.1)
Sodium: 136 mmol/L (ref 135–145)
Total Bilirubin: 4.1 mg/dL (ref 0.3–1.2)
Total Protein: 7.1 g/dL (ref 6.5–8.1)

## 2021-11-27 LAB — LACTATE DEHYDROGENASE: LDH: 211 U/L — ABNORMAL HIGH (ref 98–192)

## 2021-11-27 MED ORDER — SODIUM CHLORIDE 0.9 % IV SOLN
3300.0000 mg | Freq: Once | INTRAVENOUS | Status: AC
  Administered 2021-11-27: 3300 mg via INTRAVENOUS
  Filled 2021-11-27: qty 33

## 2021-11-27 MED ORDER — SODIUM CHLORIDE 0.9 % IV SOLN: Freq: Once | INTRAVENOUS | Status: AC

## 2021-11-27 MED ORDER — METHYLPREDNISOLONE 4 MG PO TBPK: ORAL_TABLET | ORAL | 0 refills | Status: DC

## 2021-11-27 NOTE — Progress Notes (Signed)
Pt declined to stay for 1 hour wait post infusion. VSS. No complaints at time of discharge.

## 2021-11-27 NOTE — Telephone Encounter (Signed)
I have called the pt and advised as indicated. Pt expressed understanding.

## 2021-11-27 NOTE — Telephone Encounter (Signed)
Mailed application for pt assistance to pt for Eloquis.

## 2021-11-27 NOTE — Telephone Encounter (Signed)
Message sent to [facility] for pt assistance for eloquis

## 2021-11-27 NOTE — Progress Notes (Signed)
[place] Telephone:([phone]) [phone]   Fax:([phone]) [phone]  OFFICE PROGRESS NOTE  [name], MD [address] 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  PRIOR THERAPY:  Soliris (Eculizumab) initially with induction 600 MG IV weekly for 4 weeks and then switched to maintenance treatment 900 MG IV every 2 weeks. First dose of treatment was given on 11/21/2014. Status post 45 cycles.  After cycle #45 the patient received his treatment at outside infusion center.  Status post 16 more cycles of treatment.  This was discontinued secondary to worsening symptoms and lack of control of his disease.  CURRENT THERAPY: Ultomiris (Ravulizumab) loading dose 2700 mg/M2 followed by maintenance dose 3300 mg/M2 every 8 weeks.  First dose of treatment September 22, 2018.  He status post the induction dose of the treatment as well as 17 cycles of maintenance therapy.  The first 3 cycles of his treatment were given at the infusion center in [facility].  Starting from cycle #18 the patient will receive his treatment at the [facility].  INTERVAL HISTORY: Travis Mcdaniel 51 y.o. male returns to the clinic today for follow-up visit.  The patient is feeling fine today with no concerning complaints except for intermittent fatigue.  He denied having any current chest pain, shortness of breath, cough or hemoptysis.  He denied having any fever or chills.  He has no nausea, vomiting, diarrhea or constipation.  He has no headache or visual changes.  He denied having any significant weight loss or night sweats.  He had denied having any bleeding, bruises or ecchymosis.  He has some issue with the infusion center in [place] with significant lack of payment and they declined to give him the infusion anymore.  The patient has his insurance approved his infusion to be done at the  [facility] health cancer center and he is here today for evaluation before starting cycle #18.  He was also advised to be on treatment with Eliquis for the DVT prophylaxis.  The patient mentioned that it is too expensive for him and he wonders if he just can continue taking aspirin without Eliquis because he cannot afford it.   MEDICAL HISTORY: Past Medical History:  Diagnosis Date   Cancer ([place])    PNH (Bone marrow)   Diverticulosis    Encounter for antineoplastic chemotherapy 01/15/2016   Hypertension     ALLERGIES:  has No Known Allergies.  MEDICATIONS:  Current Outpatient Medications  Medication Sig Dispense Refill   ALPRAZolam (XANAX) 1 MG tablet Take 1 mg by mouth 3 (three) times daily.  2   apixaban (ELIQUIS) 2.5 MG TABS tablet Take 1 tablet (2.5 mg total) by mouth 2 (two) times daily. 60 tablet 2   aspirin EC 81 MG tablet Take 81 mg by mouth.     lisinopril-hydrochlorothiazide (PRINZIDE,ZESTORETIC) 20-25 MG tablet Take 1 tablet by mouth daily.     Multiple Vitamins-Minerals (MULTIVITAMIN ADULT PO) Take by mouth.     OVER THE COUNTER MEDICATION Take 1 tablet by mouth daily. OTC potassium     predniSONE (DELTASONE) 10 MG tablet 2 tablet p.o. daily for 1 week and then 1 tablet p.o. daily for 1 week. 21 tablet 0   Psyllium (METAMUCIL PO) Take 1 capsule by mouth daily.     tetrahydrozoline 0.05 % ophthalmic solution Place 1 drop into both eyes 2 (two) times daily as needed (eye  irritation).     traMADol (ULTRAM) 50 MG tablet Take 50 mg by mouth 2 (two) times daily.     No current facility-administered medications for this visit.    SURGICAL HISTORY: No past surgical history on file.  REVIEW OF SYSTEMS:  Constitutional: positive for fatigue Eyes: negative Ears, nose, mouth, throat, and face: negative Respiratory: negative Cardiovascular: negative Gastrointestinal: negative Genitourinary:negative Integument/breast: negative Hematologic/lymphatic:  negative Musculoskeletal:negative Neurological: negative Behavioral/Psych: negative Endocrine: negative Allergic/Immunologic: negative   PHYSICAL EXAMINATION: General appearance: alert, cooperative, fatigued, and no distress Head: Normocephalic, without obvious abnormality, atraumatic Neck: no adenopathy, no JVD, supple, symmetrical, trachea midline, and thyroid not enlarged, symmetric, no tenderness/mass/nodules Lymph nodes: Cervical, supraclavicular, and axillary nodes normal. Resp: clear to auscultation bilaterally Back: symmetric, no curvature. ROM normal. No CVA tenderness. Cardio: regular rate and rhythm, S1, S2 normal, no murmur, click, rub or gallop GI: soft, non-tender; bowel sounds normal; no masses,  no organomegaly Extremities: extremities normal, atraumatic, no cyanosis or edema Neurologic: Alert and oriented X 3, normal strength and tone. Normal symmetric reflexes. Normal coordination and gait  ECOG PERFORMANCE STATUS: 1 - Symptomatic but completely ambulatory  Blood pressure 115/65, pulse 78, temperature (!) 97.1 F (36.2 C), temperature source Tympanic, resp. rate 18, weight 169 lb 7 oz (76.9 kg), SpO2 99 %.  LABORATORY DATA: Lab Results  Component Value Date   WBC 4.4 10/01/2021   HGB 10.9 (L) 10/01/2021   HCT 31.8 (L) 10/01/2021   MCV 116.9 (H) 10/01/2021   PLT 151 10/01/2021      Chemistry      Component Value Date/Time   NA 136 10/01/2021 0735   NA 136 06/28/2017 1236   K 3.8 10/01/2021 0735   K 4.1 06/28/2017 1236   CL 103 10/01/2021 0735   CO2 24 10/01/2021 0735   CO2 24 06/28/2017 1236   BUN 12 10/01/2021 0735   BUN 17.1 06/28/2017 1236   CREATININE 0.96 10/01/2021 0735   CREATININE 1.0 06/28/2017 1236      Component Value Date/Time   CALCIUM 8.8 (L) 10/01/2021 0735   CALCIUM 9.1 06/28/2017 1236   ALKPHOS 59 10/01/2021 0735   ALKPHOS 66 06/28/2017 1236   AST 72 (H) 10/01/2021 0735   AST 40 (H) 06/28/2017 1236   ALT 96 (H) 10/01/2021 0735    ALT 60 (H) 06/28/2017 1236   BILITOT 2.7 (H) 10/01/2021 0735   BILITOT 2.87 (H) 06/28/2017 1236       RADIOGRAPHIC STUDIES: No results found.  ASSESSMENT AND PLAN:  This is a very pleasant 51 years old white male with paroxysmal nocturnal hemoglobinuria and currently on treatment with Soliris on days 1 and 15 every 4 weeks status post 45 cycles.  He is also on the same treatment received at outside infusion center status post 19 more cycles. He also completed a course of tapered dose of prednisone. He is currently on treatment with Ultomiris status post induction dose followed by 17 cycles of maintenance therapy.  He was receiving his treatment at the [facility] infusion center but because of significant lack of payment they declined to give him the infusions there anymore. The patient was able to get his insurance to cover his infusion with Ultomiris at the [facility] and he will receive those #18 in the infusion center at the cancer center today. For the DVT prophylaxis, he was advised by Dr. Gita Kudo at [facility] to start treatment with Eliquis but the patient mentioned that it is so  expensive for him and he will not be able to afford it.  I recommended for him to continue on aspirin daily for now until we find some resources to cover his Eliquis. The patient will come back for follow-up visit in 8 weeks for evaluation before the next cycle of his treatment. He was advised to call immediately if he has any other concerning symptoms in the interval. The patient voices understanding of current disease status and treatment options and is in agreement with the current care plan. All questions were answered. The patient knows to call the clinic with any problems, questions or concerns. We can certainly see the patient much sooner if necessary.  Disclaimer: This note was dictated with voice recognition software. Similar sounding words can inadvertently be transcribed and may  not be corrected upon review.

## 2021-11-27 NOTE — Patient Instructions (Signed)
[facility] ONCOLOGY  Discharge Instructions: Thank you for choosing [facility] to provide your oncology and hematology care.   If you have a lab appointment with the [facility], please go directly to the [facility] and check in at the registration area.   Wear comfortable clothing and clothing appropriate for easy access to any Portacath or PICC line.   We strive to give you quality time with your provider. You may need to reschedule your appointment if you arrive late (15 or more minutes).  Arriving late affects you and other patients whose appointments are after yours.  Also, if you miss three or more appointments without notifying the office, you may be dismissed from the clinic at the provider's discretion.      For prescription refill requests, have your pharmacy contact our office and allow 72 hours for refills to be completed.    Today you received the following chemotherapy and/or immunotherapy agents: Ultomiris.       To help prevent nausea and vomiting after your treatment, we encourage you to take your nausea medication as directed.  BELOW ARE SYMPTOMS THAT SHOULD BE REPORTED IMMEDIATELY: *FEVER GREATER THAN 100.4 F (38 C) OR HIGHER *CHILLS OR SWEATING *NAUSEA AND VOMITING THAT IS NOT CONTROLLED WITH YOUR NAUSEA MEDICATION *UNUSUAL SHORTNESS OF BREATH *UNUSUAL BRUISING OR BLEEDING *URINARY PROBLEMS (pain or burning when urinating, or frequent urination) *BOWEL PROBLEMS (unusual diarrhea, constipation, pain near the anus) TENDERNESS IN MOUTH AND THROAT WITH OR WITHOUT PRESENCE OF ULCERS (sore throat, sores in mouth, or a toothache) UNUSUAL RASH, SWELLING OR PAIN  UNUSUAL VAGINAL DISCHARGE OR ITCHING   Items with * indicate a potential emergency and should be followed up as soon as possible or go to the Emergency Department if any problems should occur.  Please show the CHEMOTHERAPY ALERT CARD or IMMUNOTHERAPY ALERT CARD at check-in  to the Emergency Department and triage nurse.  Should you have questions after your visit or need to cancel or reschedule your appointment, please contact [facility]  Dept: [phone]  and follow the prompts.  Office hours are 8:00 a.m. to 4:30 p.m. Monday - Friday. Please note that voicemails left after 4:00 p.m. may not be returned until the following business day.  We are closed weekends and major holidays. You have access to a nurse at all times for urgent questions. Please call the main number to the clinic Dept: [phone] and follow the prompts.   For any non-urgent questions, you may also contact your provider using MyChart. We now offer e-Visits for anyone 91 and older to request care online for non-urgent symptoms. For details visit [email or web address].   Also download the MyChart app! Go to the app store, search "MyChart", open the app, select [facility], and log in with your MyChart username and password.  Due to Covid, a mask is required upon entering the hospital/clinic. If you do not have a mask, one will be given to you upon arrival. For doctor visits, patients may have 1 support person aged 36 or older with them. For treatment visits, patients cannot have anyone with them due to current Covid guidelines and our immunocompromised population.

## 2021-11-27 NOTE — Telephone Encounter (Signed)
CRITICAL VALUE STICKER  CRITICAL VALUE: Tatal Bili  RECEIVER (on-site recipient of call): Yetta Glassman, CMA  DATE & TIME NOTIFIED: 11/27/21 9:11am  MESSENGER (representative from lab): Verdis Frederickson  MD NOTIFIED: Julien Nordmann  TIME OF NOTIFICATION: 11/27/21 9:20am  RESPONSE: Per Dr. Julien Nordmann, he will send a rx for steriods.

## 2021-12-04 ENCOUNTER — Inpatient Hospital Stay: Payer: BC Managed Care – PPO

## 2021-12-04 ENCOUNTER — Telehealth: Payer: Self-pay | Admitting: Medical Oncology

## 2021-12-04 ENCOUNTER — Inpatient Hospital Stay: Payer: BC Managed Care – PPO | Admitting: Internal Medicine

## 2021-12-04 ENCOUNTER — Encounter: Payer: Self-pay | Admitting: Internal Medicine

## 2021-12-04 NOTE — Telephone Encounter (Signed)
Pt feels better now after Prednisone. Next appt confirmed with pt.  Pt instructed to call for any concerns .

## 2022-01-08 ENCOUNTER — Telehealth: Payer: Self-pay | Admitting: Internal Medicine

## 2022-01-08 ENCOUNTER — Telehealth: Payer: Self-pay | Admitting: Medical Oncology

## 2022-01-08 NOTE — Telephone Encounter (Signed)
"   My eyes are yellow and my urine is dark . I feel  fine". Schedule message sent for Lab appt tomorrow.

## 2022-01-08 NOTE — Telephone Encounter (Signed)
.  Called patient to schedule appointment per 7/13 inbasket, patient is aware of date and time.   

## 2022-01-09 ENCOUNTER — Other Ambulatory Visit: Payer: Self-pay

## 2022-01-09 ENCOUNTER — Other Ambulatory Visit: Payer: Self-pay | Admitting: Medical Oncology

## 2022-01-09 ENCOUNTER — Inpatient Hospital Stay: Payer: BC Managed Care – PPO | Attending: Internal Medicine

## 2022-01-09 ENCOUNTER — Inpatient Hospital Stay (HOSPITAL_BASED_OUTPATIENT_CLINIC_OR_DEPARTMENT_OTHER): Payer: BC Managed Care – PPO | Admitting: Physician Assistant

## 2022-01-09 ENCOUNTER — Other Ambulatory Visit: Payer: Self-pay | Admitting: Internal Medicine

## 2022-01-09 ENCOUNTER — Telehealth: Payer: Self-pay

## 2022-01-09 VITALS — BP 128/77 | HR 100 | Temp 97.7°F | Resp 17 | Wt 171.3 lb

## 2022-01-09 DIAGNOSIS — D595 Paroxysmal nocturnal hemoglobinuria [Marchiafava-Micheli]: Secondary | ICD-10-CM | POA: Insufficient documentation

## 2022-01-09 DIAGNOSIS — M25572 Pain in left ankle and joints of left foot: Secondary | ICD-10-CM

## 2022-01-09 DIAGNOSIS — Z7901 Long term (current) use of anticoagulants: Secondary | ICD-10-CM | POA: Diagnosis not present

## 2022-01-09 DIAGNOSIS — E860 Dehydration: Secondary | ICD-10-CM | POA: Insufficient documentation

## 2022-01-09 LAB — CBC WITH DIFFERENTIAL (CANCER CENTER ONLY)
Abs Immature Granulocytes: 0.03 10*3/uL (ref 0.00–0.07)
Basophils Absolute: 0 10*3/uL (ref 0.0–0.1)
Basophils Relative: 1 %
Eosinophils Absolute: 0.1 10*3/uL (ref 0.0–0.5)
Eosinophils Relative: 1 %
HCT: 26.5 % — ABNORMAL LOW (ref 39.0–52.0)
Hemoglobin: 9.8 g/dL — ABNORMAL LOW (ref 13.0–17.0)
Immature Granulocytes: 0 %
Lymphocytes Relative: 12 %
Lymphs Abs: 0.9 10*3/uL (ref 0.7–4.0)
MCH: 41.5 pg — ABNORMAL HIGH (ref 26.0–34.0)
MCHC: 37 g/dL — ABNORMAL HIGH (ref 30.0–36.0)
MCV: 112.3 fL — ABNORMAL HIGH (ref 80.0–100.0)
Monocytes Absolute: 0.8 10*3/uL (ref 0.1–1.0)
Monocytes Relative: 11 %
Neutro Abs: 5.3 10*3/uL (ref 1.7–7.7)
Neutrophils Relative %: 75 %
Platelet Count: 145 10*3/uL — ABNORMAL LOW (ref 150–400)
RBC: 2.36 MIL/uL — ABNORMAL LOW (ref 4.22–5.81)
RDW: 13.4 % (ref 11.5–15.5)
WBC Count: 7.1 10*3/uL (ref 4.0–10.5)
nRBC: 0 % (ref 0.0–0.2)

## 2022-01-09 LAB — CMP (CANCER CENTER ONLY)
ALT: 103 U/L — ABNORMAL HIGH (ref 0–44)
AST: 79 U/L — ABNORMAL HIGH (ref 15–41)
Albumin: 4.6 g/dL (ref 3.5–5.0)
Alkaline Phosphatase: 58 U/L (ref 38–126)
Anion gap: 9 (ref 5–15)
BUN: 24 mg/dL — ABNORMAL HIGH (ref 6–20)
CO2: 26 mmol/L (ref 22–32)
Calcium: 9.6 mg/dL (ref 8.9–10.3)
Chloride: 100 mmol/L (ref 98–111)
Creatinine: 1.01 mg/dL (ref 0.61–1.24)
GFR, Estimated: >60 mL/min (ref >60)
Glucose, Bld: 109 mg/dL — ABNORMAL HIGH (ref 70–99)
Potassium: 3.6 mmol/L (ref 3.5–5.1)
Sodium: 135 mmol/L (ref 135–145)
Total Bilirubin: 5.2 mg/dL (ref 0.3–1.2)
Total Protein: 7.4 g/dL (ref 6.5–8.1)

## 2022-01-09 LAB — LACTATE DEHYDROGENASE: LDH: 295 U/L — ABNORMAL HIGH (ref 98–192)

## 2022-01-09 MED ORDER — OXYCODONE HCL 5 MG PO TABS: 5.0000 mg | ORAL_TABLET | Freq: Four times a day (QID) | ORAL | 0 refills | Status: AC | PRN

## 2022-01-09 MED ORDER — PREDNISONE 10 MG PO TABS: ORAL_TABLET | ORAL | 0 refills | Status: DC

## 2022-01-09 NOTE — Progress Notes (Signed)
Symptom Management Consult note [place]    Patient Care Team: [name], MD as PCP - General (Internal Medicine)    Name of the patient: Travis Mcdaniel  [id number]  Apr 20, 1971   Date of visit: 01/09/2022    Chief complaint/ Reason for visit- ankle pain, dehydration  Oncology History   No history exists.    Current Therapy: Ravulizumab     Interval history- Travis Mcdaniel is a 51 y.o. with oncologic history of [place] presenting to [facility] today with chief complaint of left ankle pain and dehydration.  He reports he has had left ankle pain x3 days.  Patient works in Biomedical scientist and states he has been pushing a heavy lawnmower up a steep hill and might have strained something in his ankle otherwise denies any obvious injury that he can remember.  He noticed that there was swelling to his ankle when he woke up this morning.  He has a boot from a previous Ortho injury that he put onto his ankle to help.  He is describing the pain as severe and throbbing.  He rates the pain 10 out of 10 in severity.  Pain is localized to the ankle.  Patient has been taking Tylenol and ibuprofen for pain although he knows he is not supposed to because of his medical history.  Patient is also concerned for dehydration over the last week.  He reports yesterday his urine was dark orange in color.  He drank at least a gallon of water and reports this morning his urine was clear in color.  He also admits to his lips feeling dry.  He is also endorsing fatigue after a very strenuous week at work because he was understaffed.  He works as a Development worker, international aid and has been out in the sun in the extreme heat.  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  He denies any fever, chills, easy bruising or bleeding, headache, shortness of breath, abdominal pain, back pain, numbness, weakness or tingling.      ROS  All other systems are reviewed and are negative for acute change  except as noted in the HPI.    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  Social History   Socioeconomic History   Marital status: Single    Spouse name: Not on file   Number of children: Not on file   Years of education: Not on file   Highest education level: Not on file  Occupational History   Not on file  Tobacco Use   Smoking status: Never   Smokeless tobacco: Never  Vaping Use   Vaping Use: Never used  Substance and Sexual Activity   Alcohol use: Yes    Comment: odouls/ budlight weekend   Drug use: No   Sexual activity: Not on file  Other Topics Concern   Not on file  Social History Narrative   Not on file   Social Determinants of Health   Financial Resource Strain: Not on file  Food Insecurity: Not on file  Transportation Needs: Not on file  Physical Activity: Not on file  Stress: Not on file  Social Connections: Not on file  Intimate Partner Violence: Not on file  Family History  Problem Relation Age of Onset   Alcohol abuse Brother    Cirrhosis Brother        deceased     Current Outpatient Medications:    oxyCODONE (OXY IR/ROXICODONE) 5 MG immediate release tablet, Take 1 tablet (5 mg total) by mouth every 6 (six) hours as needed for up to 5 days for severe pain., Disp: 20 tablet, Rfl: 0   ALPRAZolam (XANAX) 1 MG tablet, Take 1 mg by mouth 3 (three) times daily., Disp: , Rfl: 2   apixaban (ELIQUIS) 2.5 MG TABS tablet, Take 1 tablet (2.5 mg total) by mouth 2 (two) times daily., Disp: 60 tablet, Rfl: 2   aspirin EC 81 MG tablet, Take 81 mg by mouth., Disp: , Rfl:    lisinopril-hydrochlorothiazide (PRINZIDE,ZESTORETIC) 20-25 MG tablet, Take 1 tablet by mouth daily., Disp: , Rfl:    Multiple Vitamins-Minerals (MULTIVITAMIN ADULT PO), Take by mouth., Disp: , Rfl:     OVER THE COUNTER MEDICATION, Take 1 tablet by mouth daily. OTC potassium, Disp: , Rfl:    predniSONE (DELTASONE) 10 MG tablet, 7 tab po qd x 4 days, 5 tab po qd x 4 days, 3 tab po qd x 4 days, 2 tab po qd x 4 days and then 1 tab po qd x 4 days, Disp: 72 tablet, Rfl: 0   Psyllium (METAMUCIL PO), Take 1 capsule by mouth daily., Disp: , Rfl:    tetrahydrozoline 0.05 % ophthalmic solution, Place 1 drop into both eyes 2 (two) times daily as needed (eye irritation)., Disp: , Rfl:    traMADol (ULTRAM) 50 MG tablet, Take 50 mg by mouth 2 (two) times daily., Disp: , Rfl:   PHYSICAL EXAM: ECOG FS:1 - Symptomatic but completely ambulatory    Vitals:   01/09/22 0942  BP: 128/77  Pulse: 100  Resp: 17  Temp: 97.7 F (36.5 C)  TempSrc: Oral  SpO2: 100%  Weight: 171 lb 4.8 oz (77.7 kg)   Physical Exam Vitals and nursing note reviewed.  Constitutional:      Appearance: He is well-developed. He is not ill-appearing or toxic-appearing.  HENT:     Head: Normocephalic and atraumatic.     Right Ear: External ear normal.     Left Ear: External ear normal.     Nose: Nose normal.     Mouth/Throat:     Mouth: Mucous membranes are moist.     Pharynx: Oropharynx is clear.  Eyes:     General: Scleral icterus present.        Right eye: No discharge.        Left eye: No discharge.     Conjunctiva/sclera: Conjunctivae normal.  Neck:     Vascular: No JVD.  Cardiovascular:     Rate and Rhythm: Normal rate and regular rhythm.     Pulses: Normal pulses.          Dorsalis pedis pulses are 2+ on the right side and 2+ on the left side.     Heart sounds: Normal heart sounds.  Pulmonary:     Effort: Pulmonary effort is normal.     Breath sounds: Normal breath sounds.  Abdominal:     General: There is no distension.     Palpations: Abdomen is soft. There is no mass.     Tenderness: There is no abdominal tenderness. There is no guarding or rebound.     Hernia: No hernia is present.  Musculoskeletal:  General: Normal range of motion.     Cervical back: Normal range of motion.     Right lower leg: No edema.     Left lower leg: No edema.     Comments: Full ROM of left hip and knee. There is swelling and tenderness over the left lateral malleolus. No overt deformity. No tenderness over the medial aspect of the ankle. The fifth metatarsal is not tender. The ankle joint is intact without excessive opening on stressing. Wiggling toes without difficulty. Good pedal pulse and cap refill of all toes.   Skin:    General: Skin is warm and dry.  Neurological:     Mental Status: He is oriented to person, place, and time.     GCS: GCS eye subscore is 4. GCS verbal subscore is 5. GCS motor subscore is 6.     Comments: Fluent speech, no facial droop.  Psychiatric:        Behavior: Behavior normal.        LABORATORY DATA: I have reviewed the data as listed    Latest Ref Rng & Units 01/09/2022    7:49 AM 11/27/2021    8:19 AM 10/01/2021    7:35 AM  CBC  WBC 4.0 - 10.5 K/uL 7.1  5.7  4.4   Hemoglobin 13.0 - 17.0 g/dL 9.8  11.6  10.9   Hematocrit 39.0 - 52.0 % 26.5  32.4  31.8   Platelets 150 - 400 K/uL 145  137  151         Latest Ref Rng & Units 01/09/2022    7:49 AM 11/27/2021    8:19 AM 10/01/2021    7:35 AM  CMP  Glucose 70 - 99 mg/dL 109  102  107   BUN 6 - 20 mg/dL '24  18  12   '$ Creatinine 0.61 - 1.24 mg/dL 1.01  1.10  0.96   Sodium 135 - 145 mmol/L 135  136  136   Potassium 3.5 - 5.1 mmol/L 3.6  3.8  3.8   Chloride 98 - 111 mmol/L 100  101  103   CO2 22 - 32 mmol/L '26  26  24   '$ Calcium 8.9 - 10.3 mg/dL 9.6  9.7  8.8   Total Protein 6.5 - 8.1 g/dL 7.4  7.1  6.8   Total Bilirubin 0.3 - 1.2 mg/dL 5.2  4.1  2.7   Alkaline Phos 38 - 126 U/L 58  59  59   AST 15 - 41 U/L 79  48  72   ALT 0 - 44 U/L 103  84  96        RADIOGRAPHIC STUDIES (from last 24 hours if applicable) I have personally reviewed the radiological images as listed and agreed with the findings in the report. No  results found.     ASSESSMENT & PLAN: Patient is a 51 y.o. male  with oncologic history of paroxysmal nocturnal hemoglobinuria followed by Dr. Earlie Server.  I have viewed most recent oncology note and lab work.   #) Ankle pain-left lower extremity is neurovascular intact.  Patient has tenderness and exam is suggestive of an ankle sprain versus fracture.  Offered patient x-ray for further evaluation of this.  He wants to hold off at this time and proceed with symptomatic care including Ace wrap and elevation and using crutches to be nonweightbearing.  Patient knows if pain worsens he should follow-up with Ortho care who he has seen in the past.  I did  send prescription for oxycodone to patient's pharmacy.  He has taken tramadol in the past with out any pain relief.  Patient should avoid Tylenol which limited narcotics that I could prescribe. I have reviewed the PDMP during this encounter. Patient knows to follow up with pcp for pain medication if needed in the future.  #) Paroxysmal Nocturnal Hemoglobinuria-CBC today with anemia 9.8 and thrombocytopenia with a platelet count of 126.  Baseline hemoglobin appears to be between 10 and 11.  CMP shows elevated AST 79, ALT 103 and total bilirubin 5.2.  Patient had lab work approximately 1 month ago and liver enzymes were 48 and 84 respectively with a total bili of 4.1.  I discussed lab results with oncologist Dr. Earlie Server who has already prescribed a steroid taper for the patient. Next appointment with oncologist is 01/21/22.  I offered patient IV fluids however he would rather continue to to increase fluid intake at home as he has no difficulty drinking water.  He is off work this weekend and plans to rest and hydrate.  Strict ED precautions discussed should symptoms worsen.    Visit Diagnosis: 1. PNH (paroxysmal nocturnal hemoglobinuria) (HCC)   2. Acute left ankle pain      No orders of the defined types were placed in this encounter.   All  questions were answered. The patient knows to call the clinic with any problems, questions or concerns. No barriers to learning was detected.  I have spent a total of 30 minutes minutes of face-to-face and non-face-to-face time, preparing to see the patient, obtaining and/or reviewing separately obtained history, performing a medically appropriate examination, counseling and educating the patient, ordering tests, documenting clinical information in the electronic health record, and care coordination (communications with other health care professionals or caregivers).    Thank you for allowing me to participate in the care of this patient.    Barrie Folk, [name] Department of Hematology/Oncology [facility] at [facility] Phone: [phone]  [phone]    01/09/2022 5:05 PM

## 2022-01-09 NOTE — Telephone Encounter (Signed)
Critical Lab reported for pt.today.  T. Bili 5.2  Notified providers Cassie Heilingoetter, [name], Dr. Julien Nordmann, and Abelina Bachelor, RN.

## 2022-01-09 NOTE — Progress Notes (Signed)
Abnormal labs -Pt presented for labs for PNH. He is feeling bad today . He has a " , gout flare up" in his left foot, he reports he is tired . His eyes -conjunctiva and are mildly icteric and he states he is tired . His lips are dry and he reports he is very thirsty.  [facility] provider appt today.

## 2022-01-12 ENCOUNTER — Telehealth: Payer: Self-pay | Admitting: Medical Oncology

## 2022-01-12 ENCOUNTER — Other Ambulatory Visit: Payer: Self-pay | Admitting: Medical Oncology

## 2022-01-12 DIAGNOSIS — D595 Paroxysmal nocturnal hemoglobinuria [Marchiafava-Micheli]: Secondary | ICD-10-CM

## 2022-01-12 MED ORDER — APIXABAN 2.5 MG PO TABS: 2.5000 mg | ORAL_TABLET | Freq: Two times a day (BID) | ORAL | 2 refills | Status: DC

## 2022-01-12 NOTE — Telephone Encounter (Signed)
LVM to return my call °

## 2022-01-20 ENCOUNTER — Other Ambulatory Visit: Payer: Self-pay | Admitting: Medical Oncology

## 2022-01-20 DIAGNOSIS — D595 Paroxysmal nocturnal hemoglobinuria [Marchiafava-Micheli]: Secondary | ICD-10-CM

## 2022-01-21 ENCOUNTER — Inpatient Hospital Stay: Payer: BC Managed Care – PPO

## 2022-01-21 ENCOUNTER — Inpatient Hospital Stay: Payer: BC Managed Care – PPO | Admitting: Internal Medicine

## 2022-01-21 ENCOUNTER — Other Ambulatory Visit: Payer: Self-pay

## 2022-01-21 VITALS — BP 137/79 | HR 64 | Temp 98.1°F | Resp 15 | Wt 169.1 lb

## 2022-01-21 VITALS — BP 114/63 | HR 66 | Temp 98.3°F | Resp 16

## 2022-01-21 DIAGNOSIS — D595 Paroxysmal nocturnal hemoglobinuria [Marchiafava-Micheli]: Secondary | ICD-10-CM

## 2022-01-21 DIAGNOSIS — Z5111 Encounter for antineoplastic chemotherapy: Secondary | ICD-10-CM | POA: Diagnosis not present

## 2022-01-21 LAB — CBC WITH DIFFERENTIAL (CANCER CENTER ONLY)
Abs Immature Granulocytes: 0.04 10*3/uL (ref 0.00–0.07)
Basophils Absolute: 0 10*3/uL (ref 0.0–0.1)
Basophils Relative: 0 %
Eosinophils Absolute: 0 10*3/uL (ref 0.0–0.5)
Eosinophils Relative: 0 %
HCT: 36.7 % — ABNORMAL LOW (ref 39.0–52.0)
Hemoglobin: 12.1 g/dL — ABNORMAL LOW (ref 13.0–17.0)
Immature Granulocytes: 1 %
Lymphocytes Relative: 25 %
Lymphs Abs: 1.7 10*3/uL (ref 0.7–4.0)
MCH: 39.3 pg — ABNORMAL HIGH (ref 26.0–34.0)
MCHC: 33 g/dL (ref 30.0–36.0)
MCV: 119.2 fL — ABNORMAL HIGH (ref 80.0–100.0)
Monocytes Absolute: 0.6 10*3/uL (ref 0.1–1.0)
Monocytes Relative: 9 %
Neutro Abs: 4.4 10*3/uL (ref 1.7–7.7)
Neutrophils Relative %: 65 %
Platelet Count: 142 10*3/uL — ABNORMAL LOW (ref 150–400)
RBC: 3.08 MIL/uL — ABNORMAL LOW (ref 4.22–5.81)
RDW: 16.4 % — ABNORMAL HIGH (ref 11.5–15.5)
WBC Count: 6.9 10*3/uL (ref 4.0–10.5)
nRBC: 0 % (ref 0.0–0.2)

## 2022-01-21 LAB — CMP (CANCER CENTER ONLY)
ALT: 61 U/L — ABNORMAL HIGH (ref 0–44)
AST: 32 U/L (ref 15–41)
Albumin: 4.4 g/dL (ref 3.5–5.0)
Alkaline Phosphatase: 53 U/L (ref 38–126)
Anion gap: 10 (ref 5–15)
BUN: 17 mg/dL (ref 6–20)
CO2: 24 mmol/L (ref 22–32)
Calcium: 8.9 mg/dL (ref 8.9–10.3)
Chloride: 102 mmol/L (ref 98–111)
Creatinine: 0.94 mg/dL (ref 0.61–1.24)
GFR, Estimated: >60 mL/min (ref >60)
Glucose, Bld: 116 mg/dL — ABNORMAL HIGH (ref 70–99)
Potassium: 3.5 mmol/L (ref 3.5–5.1)
Sodium: 136 mmol/L (ref 135–145)
Total Bilirubin: 2.8 mg/dL — ABNORMAL HIGH (ref 0.3–1.2)
Total Protein: 6.6 g/dL (ref 6.5–8.1)

## 2022-01-21 LAB — LACTATE DEHYDROGENASE: LDH: 183 U/L (ref 98–192)

## 2022-01-21 MED ORDER — SODIUM CHLORIDE 0.9 % IV SOLN: Freq: Once | INTRAVENOUS | Status: AC

## 2022-01-21 MED ORDER — SODIUM CHLORIDE 0.9 % IV SOLN
3300.0000 mg | Freq: Once | INTRAVENOUS | Status: AC
  Administered 2022-01-21: 3300 mg via INTRAVENOUS
  Filled 2022-01-21: qty 33

## 2022-01-21 NOTE — Progress Notes (Signed)
Pt declined to be observed for 60 minutes post Ultomiris infusion. Pt tolerated trtmt well w/out incident. VSS at discharge.  Ambulatory to lobby in stable condition w/ no complaints.[name] Kitchen

## 2022-01-21 NOTE — Patient Instructions (Signed)
[facility]  Discharge Instructions: Thank you for choosing [facility] to provide your oncology and hematology care.   If you have a lab appointment with the [facility], please go directly to the [facility] and check in at the registration area.   Wear comfortable clothing and clothing appropriate for easy access to any Portacath or PICC line.   We strive to give you quality time with your provider. You may need to reschedule your appointment if you arrive late (15 or more minutes).  Arriving late affects you and other patients whose appointments are after yours.  Also, if you miss three or more appointments without notifying the office, you may be dismissed from the clinic at the provider's discretion.      For prescription refill requests, have your pharmacy contact our office and allow 72 hours for refills to be completed.    Today you received the following chemotherapy and/or immunotherapy agents: Ultomiris       To help prevent nausea and vomiting after your treatment, we encourage you to take your nausea medication as directed.  BELOW ARE SYMPTOMS THAT SHOULD BE REPORTED IMMEDIATELY: *FEVER GREATER THAN 100.4 F (38 C) OR HIGHER *CHILLS OR SWEATING *NAUSEA AND VOMITING THAT IS NOT CONTROLLED WITH YOUR NAUSEA MEDICATION *UNUSUAL SHORTNESS OF BREATH *UNUSUAL BRUISING OR BLEEDING *URINARY PROBLEMS (pain or burning when urinating, or frequent urination) *BOWEL PROBLEMS (unusual diarrhea, constipation, pain near the anus) TENDERNESS IN MOUTH AND THROAT WITH OR WITHOUT PRESENCE OF ULCERS (sore throat, sores in mouth, or a toothache) UNUSUAL RASH, SWELLING OR PAIN  UNUSUAL VAGINAL DISCHARGE OR ITCHING   Items with * indicate a potential emergency and should be followed up as soon as possible or go to the Emergency Department if any problems should occur.  Please show the CHEMOTHERAPY ALERT CARD or IMMUNOTHERAPY ALERT CARD at check-in to  the Emergency Department and triage nurse.  Should you have questions after your visit or need to cancel or reschedule your appointment, please contact [facility]  Dept: [phone]  and follow the prompts.  Office hours are 8:00 a.m. to 4:30 p.m. Monday - Friday. Please note that voicemails left after 4:00 p.m. may not be returned until the following business day.  We are closed weekends and major holidays. You have access to a nurse at all times for urgent questions. Please call the main number to the clinic Dept: [phone] and follow the prompts.   For any non-urgent questions, you may also contact your provider using MyChart. We now offer e-Visits for anyone 51 and older to request care online for non-urgent symptoms. For details visit [email or web address].   Also download the MyChart app! Go to the app store, search "MyChart", open the app, select [facility], and log in with your MyChart username and password.  Masks are optional in the cancer centers. If you would like for your care team to wear a mask while they are taking care of you, please let them know. For doctor visits, patients may have with them one support person who is at least 53 years old. At this time, visitors are not allowed in the infusion area.

## 2022-01-21 NOTE — Progress Notes (Signed)
[facility] [phone]   Fax:([phone]  OFFICE PROGRESS NOTE  [name], MD [address] 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  PRIOR THERAPY:  Soliris (Eculizumab) initially with induction 600 MG IV weekly for 4 weeks and then switched to maintenance treatment 900 MG IV every 2 weeks. First dose of treatment was given on 11/21/2014. Status post 45 cycles.  After cycle #45 the patient received his treatment at outside infusion center.  Status post 16 more cycles of treatment.  This was discontinued secondary to worsening symptoms and lack of control of his disease.  CURRENT THERAPY: Ultomiris (Ravulizumab) loading dose 2700 mg/M2 followed by maintenance dose 3300 mg/M2 every 8 weeks.  First dose of treatment September 22, 2018.  He status post the induction dose of the treatment as well as 18 cycles of maintenance therapy.  The first 3 cycles of his treatment were given at the infusion center in [facility].  Starting from cycle #18 the patient will receive his treatment at the [facility].  INTERVAL HISTORY: Travis Mcdaniel 51 y.o. male returns to the clinic today for follow-up visit.  The patient is feeling fine today with no concerning complaints.  He had an episode of significant anemia and jaundice last week.  He was seen at the symptom management I started him on steroid.  He is feeling much better today.  He denied having any current chest pain, shortness of breath, cough or hemoptysis.  He denied having any fever or chills.  He has no nausea, vomiting, diarrhea or constipation.  He is here today for evaluation before starting cycle #19 of his treatment.   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  MEDICATIONS:  Current Outpatient Medications  Medication Sig Dispense Refill   ALPRAZolam (XANAX) 1 MG tablet Take 1 mg by mouth 3 (three) times daily.  2   apixaban (ELIQUIS) 2.5 MG TABS tablet Take 1 tablet (2.5 mg total) by mouth 2 (two) times daily. 60 tablet 2   aspirin EC 81 MG tablet Take 81 mg by mouth.     lisinopril-hydrochlorothiazide (PRINZIDE,ZESTORETIC) 20-25 MG tablet Take 1 tablet by mouth daily.     Multiple Vitamins-Minerals (MULTIVITAMIN ADULT PO) Take by mouth.     OVER THE COUNTER MEDICATION Take 1 tablet by mouth daily. OTC potassium     predniSONE (DELTASONE) 10 MG tablet 7 tab po qd x 4 days, 5 tab po qd x 4 days, 3 tab po qd x 4 days, 2 tab po qd x 4 days and then 1 tab po qd x 4 days 72 tablet 0   Psyllium (METAMUCIL PO) Take 1 capsule by mouth daily.     tetrahydrozoline 0.05 % ophthalmic solution Place 1 drop into both eyes 2 (two) times daily as needed (eye irritation).     traMADol (ULTRAM) 50 MG tablet Take 50 mg by mouth 2 (two) times daily.     No current facility-administered medications for this visit.    SURGICAL HISTORY: No past surgical history on file.  REVIEW OF SYSTEMS:  A comprehensive review of systems was negative except for: Constitutional: positive for fatigue   PHYSICAL EXAMINATION:  General appearance: alert, cooperative, fatigued, and no distress Head: Normocephalic, without obvious abnormality, atraumatic Neck: no adenopathy, no JVD, supple, symmetrical, trachea midline, and thyroid not enlarged, symmetric, no tenderness/mass/nodules Lymph nodes: Cervical, supraclavicular, and axillary nodes normal. Resp: clear to auscultation bilaterally Back: symmetric, no curvature. ROM normal. No CVA tenderness. Cardio: regular rate and rhythm, S1, S2 normal, no murmur, click, rub or gallop GI: soft, non-tender; bowel sounds normal; no masses,  no organomegaly Extremities:  extremities normal, atraumatic, no cyanosis or edema  ECOG PERFORMANCE STATUS: 1 - Symptomatic but completely ambulatory  Blood pressure 137/79, pulse 64, temperature 98.1 F (36.7 C), temperature source Oral, resp. rate 15, weight 169 lb 1.6 oz (76.7 kg), SpO2 100 %.  LABORATORY DATA: Lab Results  Component Value Date   WBC 6.9 01/21/2022   HGB 12.1 (L) 01/21/2022   HCT 36.7 (L) 01/21/2022   MCV 119.2 (H) 01/21/2022   PLT 142 (L) 01/21/2022      Chemistry      Component Value Date/Time   NA 135 01/09/2022 0749   NA 136 06/28/2017 1236   K 3.6 01/09/2022 0749   K 4.1 06/28/2017 1236   CL 100 01/09/2022 0749   CO2 26 01/09/2022 0749   CO2 24 06/28/2017 1236   BUN 24 (H) 01/09/2022 0749   BUN 17.1 06/28/2017 1236   CREATININE 1.01 01/09/2022 0749   CREATININE 1.0 06/28/2017 1236      Component Value Date/Time   CALCIUM 9.6 01/09/2022 0749   CALCIUM 9.1 06/28/2017 1236   ALKPHOS 58 01/09/2022 0749   ALKPHOS 66 06/28/2017 1236   AST 79 (H) 01/09/2022 0749   AST 40 (H) 06/28/2017 1236   ALT 103 (H) 01/09/2022 0749   ALT 60 (H) 06/28/2017 1236   BILITOT 5.2 (HH) 01/09/2022 0749   BILITOT 2.87 (H) 06/28/2017 1236       RADIOGRAPHIC STUDIES: No results found.  ASSESSMENT AND PLAN:  This is a very pleasant 51 years old white male with paroxysmal nocturnal hemoglobinuria and currently on treatment with Soliris on days 1 and 15 every 4 weeks status post 45 cycles.  He is also on the same treatment received at outside infusion center status post 19 more cycles. He also completed a course of tapered dose of prednisone. He is currently on treatment with Ultomiris status post induction dose followed by 18 cycles of maintenance therapy.  He was receiving his treatment at the [facility] infusion center but because of significant lack of payment they declined to give him the infusions there anymore.  Starting from cycle #18 he received his treatment at the [facility] in [place]. He tolerated the last cycle of his treatment well but he has breakthrough with significant anemia and jaundice last week.  The patient was treated with steroids and he is feeling much better.  His number today has improved. I recommended for him to proceed with cycle #19 today as planned. For the DVT prophylaxis, he started treatment with Eliquis 2.5 mg p.o. twice daily. The patient will come back for follow-up visit in 8 weeks for evaluation before the next cycle of his treatment.  He is wondering about reducing the frequency of his treatment to 7 weeks to avoid any breakthrough but this is not currently indicated and I will check with pharmacy and insurance to see if this is doable. The patient was advised to call immediately if he has any other concerning symptoms in the interval. The patient voices understanding of  current disease status and treatment options and is in agreement with the current care plan. All questions were answered. The patient knows to call the clinic with any problems, questions or concerns. We can certainly see the patient much sooner if necessary.  Disclaimer: This note was dictated with voice recognition software. Similar sounding words can inadvertently be transcribed and may not be corrected upon review.

## 2022-01-21 NOTE — Addendum Note (Signed)
Addended by: Ardeen Garland on: 01/21/2022 10:23 AM   Modules accepted: Orders

## 2022-02-05 ENCOUNTER — Other Ambulatory Visit: Payer: 59

## 2022-03-05 ENCOUNTER — Other Ambulatory Visit: Payer: 59

## 2022-03-17 ENCOUNTER — Ambulatory Visit (INDEPENDENT_AMBULATORY_CARE_PROVIDER_SITE_OTHER): Payer: BC Managed Care – PPO | Admitting: Physician Assistant

## 2022-03-17 ENCOUNTER — Encounter: Payer: Self-pay | Admitting: Physician Assistant

## 2022-03-17 ENCOUNTER — Ambulatory Visit (INDEPENDENT_AMBULATORY_CARE_PROVIDER_SITE_OTHER): Payer: BC Managed Care – PPO

## 2022-03-17 DIAGNOSIS — M25572 Pain in left ankle and joints of left foot: Secondary | ICD-10-CM

## 2022-03-17 MED ORDER — OXYCODONE HCL 5 MG PO CAPS: 5.0000 mg | ORAL_CAPSULE | Freq: Three times a day (TID) | ORAL | 0 refills | Status: DC | PRN

## 2022-03-17 NOTE — Progress Notes (Signed)
Office Visit Note   Patient: Travis Mcdaniel           Date of Birth: 03-04-1971           MRN: [id number] Visit Date: 03/17/2022              Requested by: [name], [place],  [place] [address] PCP: [name], MD  Chief Complaint  Patient presents with   Left Ankle - Pain      HPI: Mr. Travis Mcdaniel is a pleasant 51 year old gentleman who I have seen for difficulties with his right ankle in the past.  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  He is 2 months status post having a twisting injury to his ankle.  He was pushing a heavy 250 pound mower up a hill when it released and rolled backwards.  He braced his left ankle and his ankle twisted down into a hole.  He did feel a pop.  He did try self treating this with elevation ice and it did get slightly better.  Unfortunately this was short-lived and he is now in a cam walker boot which provides little comfort to him.  He cannot take anti-inflammatories because of his blood disease.  He has not gotten any better in 2 months and still is requiring crutches.  No previous injury   Assessment & Plan: Visit Diagnoses:  1. Pain in left ankle and joints of left foot     Injury was now 2 months ago and he has had no improvement.  He has pain when he tries to bear weight.  He is exquisitely tender over the peroneal tendons and over the deltoid.  He is very weak with resisted external rotation and cannot do internal rotation.  I am concerned about an injury to these tendon and ligamentous structures.  His x-ray is negative for any acute fracture he is tender in the posterior ankle where by x-ray he has an os trigonum.  Also has some tenderness over the distal Achilles though it is intact.  Because of the length of time and no improvement I recommended an MRI.  I will review this when it is available.  In the meantime he will remain in his cam walker boot and use crutches as  needed.  Because he is having a lot of pain I did prescribe him a few oxycodone.  He understands that I will not give him a refill on this.  Unfortunately he cannot use anti-inflammatories because of his blood disease.  He was given tramadol by his primary care provider but he is found this unhelpful.  Continue to ice and elevate  Follow-Up Instructions: Return after MRI.   Ortho Exam  Patient is alert, oriented, no adenopathy, well-dressed, normal affect, normal respiratory effort. Examination of his left foot he has a palpable pulse.  No soft tissue swelling in the foot minor in the ankle.  He has no tenderness to palpation with manipulation of the midfoot and forefoot.  He has no significant tenderness over the medial or lateral malleoli.  He does have tenderness over the deltoid.  Also tenderness over the peroneal tendon.  He has ability to return externally rotate though tender.  He resists internal rotation secondary to pain.  No significant posterior impingement findings.  He is able to flex and extend his ankle with fairly good strength.  No tenderness of the proximal fibula or the compartments of the  lower leg  Imaging: No results found. No images are attached to the encounter.  Labs: Lab Results  Component Value Date   REPTSTATUS 09/07/2014 FINAL 09/06/2014   CULT NO GROWTH Performed at [facility]  09/06/2014     Lab Results  Component Value Date   ALBUMIN 4.4 01/21/2022   ALBUMIN 4.6 01/09/2022   ALBUMIN 4.7 11/27/2021    No results found for: "MG" No results found for: "VD25OH"  No results found for: "PREALBUMIN"    Latest Ref Rng & Units 01/21/2022    7:37 AM 01/09/2022    7:49 AM 11/27/2021    8:19 AM  CBC EXTENDED  WBC 4.0 - 10.5 K/uL 6.9  7.1  5.7   RBC 4.22 - 5.81 MIL/uL 3.08  2.36  2.83   Hemoglobin 13.0 - 17.0 g/dL 12.1  9.8  11.6   HCT 39.0 - 52.0 % 36.7  26.5  32.4   Platelets 150 - 400 K/uL 142  145  137   NEUT# 1.7 - 7.7 K/uL 4.4  5.3  4.1    Lymph# 0.7 - 4.0 K/uL 1.7  0.9  0.9      There is no height or weight on file to calculate BMI.  Orders:  Orders Placed This Encounter  Procedures   XR Ankle Complete Left   MR Ankle Left w/o contrast   Meds ordered this encounter  Medications   oxycodone (OXY-IR) 5 MG capsule    Sig: Take 1 capsule (5 mg total) by mouth every 8 (eight) hours as needed.    Dispense:  30 capsule    Refill:  0     Procedures: No procedures performed  Clinical Data: No additional findings.  ROS:  All other systems negative, except as noted in the HPI. Review of Systems  Objective: Vital Signs: There were no vitals taken for this visit.  Specialty Comments:  No specialty comments available.  PMFS History: Patient Active Problem List   Diagnosis Date Noted   PNH (paroxysmal nocturnal hemoglobinuria) (HCC) 03/18/2018   Hemolytic anemia (HCC) 03/18/2018   Sinusitis 09/09/2016   Encounter for antineoplastic chemotherapy 01/15/2016   Paroxysmal nocturnal hemoglobinuria (PNH) (HCC) 11/12/2014   Abscess 10/24/2014   Hypertension 10/24/2014   Hyperbilirubinemia 10/24/2014   Anxiety 10/18/2014   Thrombocytopenia ([place]) 10/12/2014   Influenza A (H1N1) 09/06/2014   Acute kidney injury ([place]) 09/05/2014   UTI (lower urinary tract infection) 09/05/2014   Alcohol dependence ([place]) 09/05/2014   Diarrhea 09/05/2014   Absolute anemia    Past Medical History:  Diagnosis Date   Cancer ([place])    PNH (Bone marrow)   Diverticulosis    Encounter for antineoplastic chemotherapy 01/15/2016   Hypertension     Family History  Problem Relation Age of Onset   Alcohol abuse Brother    Cirrhosis Brother        deceased    No past surgical history on file. Social History   Occupational History   Not on file  Tobacco Use   Smoking status: Never   Smokeless tobacco: Never  Vaping Use   Vaping Use: Never used  Substance and Sexual Activity   Alcohol use: Yes    Comment: odouls/ budlight  weekend   Drug use: No   Sexual activity: Not on file

## 2022-03-18 ENCOUNTER — Inpatient Hospital Stay (HOSPITAL_BASED_OUTPATIENT_CLINIC_OR_DEPARTMENT_OTHER): Payer: BC Managed Care – PPO | Admitting: Internal Medicine

## 2022-03-18 ENCOUNTER — Inpatient Hospital Stay: Payer: BC Managed Care – PPO

## 2022-03-18 ENCOUNTER — Telehealth: Payer: Self-pay

## 2022-03-18 ENCOUNTER — Inpatient Hospital Stay: Payer: BC Managed Care – PPO | Attending: Internal Medicine

## 2022-03-18 ENCOUNTER — Other Ambulatory Visit: Payer: Self-pay

## 2022-03-18 ENCOUNTER — Encounter: Payer: Self-pay | Admitting: Internal Medicine

## 2022-03-18 VITALS — BP 128/69 | HR 78 | Temp 97.9°F | Resp 15 | Wt 173.2 lb

## 2022-03-18 DIAGNOSIS — Z7901 Long term (current) use of anticoagulants: Secondary | ICD-10-CM | POA: Diagnosis not present

## 2022-03-18 DIAGNOSIS — D595 Paroxysmal nocturnal hemoglobinuria [Marchiafava-Micheli]: Secondary | ICD-10-CM | POA: Insufficient documentation

## 2022-03-18 DIAGNOSIS — Z5111 Encounter for antineoplastic chemotherapy: Secondary | ICD-10-CM | POA: Diagnosis not present

## 2022-03-18 LAB — CBC WITH DIFFERENTIAL (CANCER CENTER ONLY)
Abs Immature Granulocytes: 0.02 10*3/uL (ref 0.00–0.07)
Basophils Absolute: 0 10*3/uL (ref 0.0–0.1)
Basophils Relative: 0 %
Eosinophils Absolute: 0 10*3/uL (ref 0.0–0.5)
Eosinophils Relative: 1 %
HCT: 27.2 % — ABNORMAL LOW (ref 39.0–52.0)
Hemoglobin: 9.9 g/dL — ABNORMAL LOW (ref 13.0–17.0)
Immature Granulocytes: 0 %
Lymphocytes Relative: 12 %
Lymphs Abs: 0.9 10*3/uL (ref 0.7–4.0)
MCH: 41.4 pg — ABNORMAL HIGH (ref 26.0–34.0)
MCHC: 36.4 g/dL — ABNORMAL HIGH (ref 30.0–36.0)
MCV: 113.8 fL — ABNORMAL HIGH (ref 80.0–100.0)
Monocytes Absolute: 0.7 10*3/uL (ref 0.1–1.0)
Monocytes Relative: 10 %
Neutro Abs: 5.4 10*3/uL (ref 1.7–7.7)
Neutrophils Relative %: 77 %
Platelet Count: 129 10*3/uL — ABNORMAL LOW (ref 150–400)
RBC: 2.39 MIL/uL — ABNORMAL LOW (ref 4.22–5.81)
RDW: 14 % (ref 11.5–15.5)
WBC Count: 7.1 10*3/uL (ref 4.0–10.5)
nRBC: 0 % (ref 0.0–0.2)

## 2022-03-18 LAB — CMP (CANCER CENTER ONLY)
ALT: 71 U/L — ABNORMAL HIGH (ref 0–44)
AST: 53 U/L — ABNORMAL HIGH (ref 15–41)
Albumin: 4.4 g/dL (ref 3.5–5.0)
Alkaline Phosphatase: 53 U/L (ref 38–126)
Anion gap: 8 (ref 5–15)
BUN: 21 mg/dL — ABNORMAL HIGH (ref 6–20)
CO2: 27 mmol/L (ref 22–32)
Calcium: 9 mg/dL (ref 8.9–10.3)
Chloride: 99 mmol/L (ref 98–111)
Creatinine: 1.13 mg/dL (ref 0.61–1.24)
GFR, Estimated: >60 mL/min (ref >60)
Glucose, Bld: 109 mg/dL — ABNORMAL HIGH (ref 70–99)
Potassium: 3.7 mmol/L (ref 3.5–5.1)
Sodium: 134 mmol/L — ABNORMAL LOW (ref 135–145)
Total Bilirubin: 4.2 mg/dL (ref 0.3–1.2)
Total Protein: 6.8 g/dL (ref 6.5–8.1)

## 2022-03-18 LAB — LACTATE DEHYDROGENASE: LDH: 247 U/L — ABNORMAL HIGH (ref 98–192)

## 2022-03-18 MED ORDER — PREDNISONE 10 MG PO TABS: ORAL_TABLET | ORAL | 0 refills | Status: DC

## 2022-03-18 MED ORDER — SODIUM CHLORIDE 0.9 % IV SOLN: Freq: Once | INTRAVENOUS | Status: AC

## 2022-03-18 MED ORDER — SODIUM CHLORIDE 0.9 % IV SOLN
3300.0000 mg | Freq: Once | INTRAVENOUS | Status: AC
  Administered 2022-03-18: 3300 mg via INTRAVENOUS
  Filled 2022-03-18: qty 33

## 2022-03-18 NOTE — Patient Instructions (Signed)
[facility]  Discharge Instructions: Thank you for choosing [facility] to provide your oncology and hematology care.   If you have a lab appointment with the Cancer Center, please go directly to the Cancer Center and check in at the registration area.   Wear comfortable clothing and clothing appropriate for easy access to any Portacath or PICC line.   We strive to give you quality time with your provider. You may need to reschedule your appointment if you arrive late (15 or more minutes).  Arriving late affects you and other patients whose appointments are after yours.  Also, if you miss three or more appointments without notifying the office, you may be dismissed from the clinic at the provider's discretion.      For prescription refill requests, have your pharmacy contact our office and allow 72 hours for refills to be completed.    Today you received the following chemotherapy and/or immunotherapy agents ultomiris      To help prevent nausea and vomiting after your treatment, we encourage you to take your nausea medication as directed.  BELOW ARE SYMPTOMS THAT SHOULD BE REPORTED IMMEDIATELY: *FEVER GREATER THAN 100.4 F (38 C) OR HIGHER *CHILLS OR SWEATING *NAUSEA AND VOMITING THAT IS NOT CONTROLLED WITH YOUR NAUSEA MEDICATION *UNUSUAL SHORTNESS OF BREATH *UNUSUAL BRUISING OR BLEEDING *URINARY PROBLEMS (pain or burning when urinating, or frequent urination) *BOWEL PROBLEMS (unusual diarrhea, constipation, pain near the anus) TENDERNESS IN MOUTH AND THROAT WITH OR WITHOUT PRESENCE OF ULCERS (sore throat, sores in mouth, or a toothache) UNUSUAL RASH, SWELLING OR PAIN  UNUSUAL VAGINAL DISCHARGE OR ITCHING   Items with * indicate a potential emergency and should be followed up as soon as possible or go to the Emergency Department if any problems should occur.  Please show the CHEMOTHERAPY ALERT CARD or IMMUNOTHERAPY ALERT CARD at check-in to  the Emergency Department and triage nurse.  Should you have questions after your visit or need to cancel or reschedule your appointment, please contact [facility]  Dept: [phone]  and follow the prompts.  Office hours are 8:00 a.m. to 4:30 p.m. Monday - Friday. Please note that voicemails left after 4:00 p.m. may not be returned until the following business day.  We are closed weekends and major holidays. You have access to a nurse at all times for urgent questions. Please call the main number to the clinic Dept: [phone] and follow the prompts.   For any non-urgent questions, you may also contact your provider using MyChart. We now offer e-Visits for anyone 18 and older to request care online for non-urgent symptoms. For details visit [email or web address].   Also download the MyChart app! Go to the app store, search "MyChart", open the app, select [facility], and log in with your MyChart username and password.  Masks are optional in the cancer centers. If you would like for your care team to wear a mask while they are taking care of you, please let them know. You may have one support person who is at least 53 years old accompany you for your appointments. 

## 2022-03-18 NOTE — Telephone Encounter (Signed)
CRITICAL VALUE STICKER  CRITICAL VALUE: Total Bili = 4.2  RECEIVER (on-site recipient of call): Yetta Glassman, CMA  DATE & TIME NOTIFIED: 03/18/22 at 9:35am  MESSENGER (representative from lab): Heather  MD NOTIFIED: [facility]  TIME OF NOTIFICATION: 03/18/22 at 9:37am  RESPONSE: Notification given directly to Dr. Julien Nordmann who was actively seeing the pt at the time of this notification.

## 2022-03-18 NOTE — Progress Notes (Signed)
[facility] Telephone:([phone]   [phone]  OFFICE PROGRESS NOTE  [name], MD [address] [name] [address]  DIAGNOSIS: Paroxysmal nocturnal hemoglobinuria presented initially as hemolytic anemia diagnosed in June 2016.  PRIOR THERAPY:  Soliris (Eculizumab) initially with induction 600 MG IV weekly for 4 weeks and then switched to maintenance treatment 900 MG IV every 2 weeks. First dose of treatment was given on 11/21/2014. Status post 45 cycles.  After cycle #45 the patient received his treatment at outside infusion center.  Status post 16 more cycles of treatment.  This was discontinued secondary to worsening symptoms and lack of control of his disease.  CURRENT THERAPY: Ultomiris (Ravulizumab) loading dose 2700 mg/M2 followed by maintenance dose 3300 mg/M2 every 8 weeks.  First dose of treatment September 22, 2018.  He status post the induction dose of the treatment as well as 19 cycles of maintenance therapy.  The first 3 cycles of his treatment were given at the infusion center in [facility].  Starting from cycle #18 the patient will receive his treatment at the [facility].  INTERVAL HISTORY: Travis Mcdaniel 51 y.o. male returns to the clinic today for follow-up visit.  The patient is complaining of increasing fatigue recently.  He also has some torn ligaments in the left foot and probably would be considered for surgical intervention soon by his orthopedic surgeon.  He denied having any chest pain, shortness of breath, cough or hemoptysis.  He has no nausea, vomiting, diarrhea or constipation.  He has no headache or visual changes.  He has no recent weight loss or night sweats.  He is here today for evaluation before starting cycle #20 of his maintenance treatment with Ultomiris.   MEDICAL HISTORY: Past Medical History:  Diagnosis Date   Cancer ([place])    PNH (Bone marrow)   Diverticulosis    Encounter  for antineoplastic chemotherapy 01/15/2016   Hypertension     ALLERGIES:  has No Known Allergies.  MEDICATIONS:  Current Outpatient Medications  Medication Sig Dispense Refill   ALPRAZolam (XANAX) 1 MG tablet Take 1 mg by mouth 3 (three) times daily.  2   apixaban (ELIQUIS) 2.5 MG TABS tablet Take 1 tablet (2.5 mg total) by mouth 2 (two) times daily. 60 tablet 2   lisinopril-hydrochlorothiazide (PRINZIDE,ZESTORETIC) 20-25 MG tablet Take 1 tablet by mouth daily.     Multiple Vitamins-Minerals (MULTIVITAMIN ADULT PO) Take by mouth.     OVER THE COUNTER MEDICATION Take 1 tablet by mouth daily. OTC potassium     oxycodone (OXY-IR) 5 MG capsule Take 1 capsule (5 mg total) by mouth every 8 (eight) hours as needed. 30 capsule 0   predniSONE (DELTASONE) 10 MG tablet 7 tab po qd x 4 days, 5 tab po qd x 4 days, 3 tab po qd x 4 days, 2 tab po qd x 4 days and then 1 tab po qd x 4 days 72 tablet 0   Psyllium (METAMUCIL PO) Take 1 capsule by mouth daily.     tetrahydrozoline 0.05 % ophthalmic solution Place 1 drop into both eyes 2 (two) times daily as needed (eye irritation).     traMADol (ULTRAM) 50 MG tablet Take 50 mg by mouth 2 (two) times daily.     No current facility-administered medications for this visit.    SURGICAL HISTORY: No past surgical history on file.  REVIEW OF SYSTEMS:  A comprehensive review of systems  was negative except for: Constitutional: positive for fatigue   PHYSICAL EXAMINATION: General appearance: alert, cooperative, fatigued, icteric, and no distress Head: Normocephalic, without obvious abnormality, atraumatic Neck: no adenopathy, no JVD, supple, symmetrical, trachea midline, and thyroid not enlarged, symmetric, no tenderness/mass/nodules Lymph nodes: Cervical, supraclavicular, and axillary nodes normal. Resp: clear to auscultation bilaterally Back: symmetric, no curvature. ROM normal. No CVA tenderness. Cardio: regular rate and rhythm, S1, S2 normal, no murmur, click,  rub or gallop GI: soft, non-tender; bowel sounds normal; no masses,  no organomegaly Extremities: extremities normal, atraumatic, no cyanosis or edema  ECOG PERFORMANCE STATUS: 1 - Symptomatic but completely ambulatory  Blood pressure 128/69, pulse 78, temperature 97.9 F (36.6 C), temperature source Oral, resp. rate 15, weight 173 lb 3.2 oz (78.6 kg), SpO2 100 %.  LABORATORY DATA: Lab Results  Component Value Date   WBC 7.1 03/18/2022   HGB 9.9 (L) 03/18/2022   HCT 27.2 (L) 03/18/2022   MCV 113.8 (H) 03/18/2022   PLT 129 (L) 03/18/2022      Chemistry      Component Value Date/Time   NA 136 01/21/2022 0737   NA 136 06/28/2017 1236   K 3.5 01/21/2022 0737   K 4.1 06/28/2017 1236   CL 102 01/21/2022 0737   CO2 24 01/21/2022 0737   CO2 24 06/28/2017 1236   BUN 17 01/21/2022 0737   BUN 17.1 06/28/2017 1236   CREATININE 0.94 01/21/2022 0737   CREATININE 1.0 06/28/2017 1236      Component Value Date/Time   CALCIUM 8.9 01/21/2022 0737   CALCIUM 9.1 06/28/2017 1236   ALKPHOS 53 01/21/2022 0737   ALKPHOS 66 06/28/2017 1236   AST 32 01/21/2022 0737   AST 40 (H) 06/28/2017 1236   ALT 61 (H) 01/21/2022 0737   ALT 60 (H) 06/28/2017 1236   BILITOT 2.8 (H) 01/21/2022 0737   BILITOT 2.87 (H) 06/28/2017 1236       RADIOGRAPHIC STUDIES: XR Ankle Complete Left  Result Date: 03/17/2022 Three-view radiographs of his left ankle reviewed today.  Well-maintained alignment through the mortise.  He does have a small anterior osteophyte of the tibia.  He also has a very small insertional spur of the Achilles posteriorly.  No acute fractures noted.  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  Cannot appreciate any acute fractures   ASSESSMENT AND PLAN:  This is a very pleasant 51 years old white male with paroxysmal nocturnal hemoglobinuria and currently on treatment with Soliris on days 1 and 15 every 4 weeks status post 45 cycles.  He is also on the same treatment received at outside infusion center  status post 19 more cycles. He also completed a course of tapered dose of prednisone. He is currently on treatment with Ultomiris status post induction dose followed by 19 cycles of maintenance therapy.  He was receiving his treatment at the [facility] infusion center but because of significant lack of payment they declined to give him the infusions there anymore.  Starting from cycle #18 he received his treatment at the [facility] in [place]. The patient has been tolerating his treatment well with no concerning adverse effects. I recommended for him to proceed with cycle #20 today as planned. For the hemolysis and anemia and elevated bilirubin, I will start him on prednisone 20 mg p.o. daily for 1 week followed by 10 mg p.o. daily for 1 week followed by 5 mg p.o. daily for 1 week.  I will repeat his CBC, comprehensive metabolic  panel and LDH in 4 weeks. For the DVT prophylaxis, he started treatment with Eliquis 2.5 mg p.o. twice daily. The patient will come back for follow-up visit in 8 weeks for evaluation with the start of the next cycle of his treatment. He was advised to call immediately if he has any other concerning symptoms in the interval. The patient voices understanding of current disease status and treatment options and is in agreement with the current care plan. All questions were answered. The patient knows to call the clinic with any problems, questions or concerns. We can certainly see the patient much sooner if necessary.  Disclaimer: This note was dictated with voice recognition software. Similar sounding words can inadvertently be transcribed and may not be corrected upon review.

## 2022-03-24 ENCOUNTER — Telehealth: Payer: Self-pay | Admitting: Physician Assistant

## 2022-03-24 NOTE — Telephone Encounter (Signed)
Called pt 1X and left a vm for pt to call and set an MRI Review appt with PA Persons after 03/30/22

## 2022-03-30 ENCOUNTER — Other Ambulatory Visit: Payer: BC Managed Care – PPO

## 2022-03-30 ENCOUNTER — Telehealth: Payer: Self-pay | Admitting: Physician Assistant

## 2022-03-30 NOTE — Telephone Encounter (Signed)
I left voicemail for patient advising. 

## 2022-03-30 NOTE — Telephone Encounter (Signed)
Pt called requesting a call from PA Persons. Pt states he had to cancel his MRI due to insurance not paying  for it and he cant afford the out of pocket cost. Pt state he would like to discuss next option. Please call pt at [phone].

## 2022-04-02 ENCOUNTER — Ambulatory Visit: Payer: BC Managed Care – PPO | Admitting: Physician Assistant

## 2022-04-02 ENCOUNTER — Inpatient Hospital Stay: Payer: BC Managed Care – PPO

## 2022-04-02 ENCOUNTER — Encounter: Payer: Self-pay | Admitting: Physician Assistant

## 2022-04-02 ENCOUNTER — Ambulatory Visit (INDEPENDENT_AMBULATORY_CARE_PROVIDER_SITE_OTHER): Payer: BC Managed Care – PPO | Admitting: Physician Assistant

## 2022-04-02 DIAGNOSIS — M542 Cervicalgia: Secondary | ICD-10-CM | POA: Diagnosis not present

## 2022-04-02 DIAGNOSIS — M25572 Pain in left ankle and joints of left foot: Secondary | ICD-10-CM | POA: Diagnosis not present

## 2022-04-02 MED ORDER — METHYLPREDNISOLONE 4 MG PO TBPK: ORAL_TABLET | ORAL | 0 refills | Status: DC

## 2022-04-02 MED ORDER — OXYCODONE HCL 5 MG PO CAPS: 5.0000 mg | ORAL_CAPSULE | Freq: Two times a day (BID) | ORAL | 0 refills | Status: DC | PRN

## 2022-04-02 NOTE — Progress Notes (Signed)
Office Visit Note   Patient: Travis Mcdaniel           Date of Birth: [birth year]           MRN: [id number] Visit Date: 04/02/2022              Requested by: [name], [place] [place],  [place] [address] PCP: [name], MD   Assessment & Plan: Visit Diagnoses:  1. Cervicalgia   2. Pain in left ankle and joints of left foot     Plan: Mr. Reggio follows up today for his left ankle pain.  He incurred this pain when he had stopped a lawnmowing machine from rolling downhill and had a twisting flexion injury of his ankle.  I examined him in the past and the pain has been acutely along the lateral and peroneal tendons and over the medial side of the ankle.  I think more than likely had an injury to the peroneal tendons and to the deltoid.  That being said he is doing better today and is wearing a regular shoe.  He now has the ability to evert his ankle.  He still has tenderness to palpation over the peroneal tendons and the deltoid but has no evidence of subluxation of the peroneal tendons.  He was to get an MRI but the co-pay was quite expensive.  Since he is getting better he would like to hold off for now.  I do think he is slowly improving.  I did caution him against being on any any uneven ground and to wear a supportive work boot when he is at work.  I told him if he decides to have an MRI he certainly can call here and I will order that for him.  I gave him 1 final refill of Percocet and he has agreed to only take this at the most twice a day and not every day  Follow-Up Instructions: Return if symptoms worsen or fail to improve.   Orders:  Orders Placed This Encounter  Procedures   MR Cervical Spine w/o contrast   Meds ordered this encounter  Medications   DISCONTD: methylPREDNISolone (MEDROL DOSEPAK) 4 MG TBPK tablet    Sig: Take as directed    Dispense:  21 tablet    Refill:  0   oxycodone (OXY-IR) 5 MG capsule    Sig: Take 1 capsule (5 mg total) by  mouth every 12 (twelve) hours as needed.    Dispense:  20 capsule    Refill:  0      Procedures: No procedures performed   Clinical Data: No additional findings.   Subjective: Chief Complaint  Patient presents with   Left Ankle - Pain    HPI pleasant 51 year old gentleman follow-up on his left ankle pain he is feeling better  Review of Systems  All other systems reviewed and are negative.    Objective: Vital Signs: There were no vitals taken for this visit.  Physical Exam Constitutional:      Appearance: Normal appearance.  Pulmonary:     Effort: Pulmonary effort is normal.  Skin:    General: Skin is warm and dry.  Neurological:     Mental Status: He is alert.     Ortho Exam Examination of his left ankle he has no swelling no ecchymosis.  No muscle atrophy.  He does good plantarflexion with slight reproduction of symptoms over the peroneal tendons.  He has improved eversion strength.  Cannot  appreciate any subluxation of the peroneal tendons.  He still has some tenderness over the deltoid.  Pulses are intact no tenderness through the midfoot no swelling foot is warm Specialty Comments:  No specialty comments available.  Imaging: No results found.   PMFS History: Patient Active Problem List   Diagnosis Date Noted   Pain in left ankle and joints of left foot 04/02/2022   PNH (paroxysmal nocturnal hemoglobinuria) (HCC) 03/18/2018   Hemolytic anemia (HCC) 03/18/2018   Sinusitis 09/09/2016   Encounter for antineoplastic chemotherapy 01/15/2016   Paroxysmal nocturnal hemoglobinuria (PNH) (HCC) 11/12/2014   Abscess 10/24/2014   Hypertension 10/24/2014   Hyperbilirubinemia 10/24/2014   Anxiety 10/18/2014   Thrombocytopenia ([place]) 10/12/2014   Influenza A (H1N1) 09/06/2014   Acute kidney injury ([place]) 09/05/2014   UTI (lower urinary tract infection) 09/05/2014   Alcohol dependence ([place]) 09/05/2014   Diarrhea 09/05/2014   Absolute anemia    Past Medical  History:  Diagnosis Date   Cancer ([place])    PNH (Bone marrow)   Diverticulosis    Encounter for antineoplastic chemotherapy 01/15/2016   Hypertension     Family History  Problem Relation Age of Onset   Alcohol abuse Brother    Cirrhosis Brother        deceased    History reviewed. No pertinent surgical history. Social History   Occupational History   Not on file  Tobacco Use   Smoking status: Never   Smokeless tobacco: Never  Vaping Use   Vaping Use: Never used  Substance and Sexual Activity   Alcohol use: Yes    Comment: odouls/ budlight weekend   Drug use: No   Sexual activity: Not on file

## 2022-04-03 ENCOUNTER — Ambulatory Visit: Payer: BC Managed Care – PPO | Admitting: Physician Assistant

## 2022-04-05 ENCOUNTER — Other Ambulatory Visit: Payer: Self-pay | Admitting: Internal Medicine

## 2022-04-06 ENCOUNTER — Encounter: Payer: Self-pay | Admitting: Internal Medicine

## 2022-04-16 ENCOUNTER — Other Ambulatory Visit: Payer: BC Managed Care – PPO

## 2022-04-27 ENCOUNTER — Telehealth: Payer: Self-pay | Admitting: Physician Assistant

## 2022-04-27 ENCOUNTER — Other Ambulatory Visit: Payer: Self-pay | Admitting: Physician Assistant

## 2022-04-27 ENCOUNTER — Telehealth: Payer: Self-pay

## 2022-04-27 MED ORDER — OXYCODONE HCL 5 MG PO CAPS: 5.0000 mg | ORAL_CAPSULE | Freq: Two times a day (BID) | ORAL | 0 refills | Status: DC | PRN

## 2022-04-27 NOTE — Telephone Encounter (Signed)
Patient called asked if Rx (Oxycodone) can be sent to [facility] on [address]. The number to contact patient is [phone]

## 2022-04-27 NOTE — Telephone Encounter (Signed)
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

## 2022-04-27 NOTE — Telephone Encounter (Signed)
Pt called requesting a call back from PA persons. Pt states he has a couple medical questions before he commits to having an MRI. Please call pt at [phone].

## 2022-04-27 NOTE — Telephone Encounter (Signed)
No new order needs to be placed, pt can call to reschedule.

## 2022-04-27 NOTE — Telephone Encounter (Signed)
Travis Mcdaniel with [facility] pharmacy, called stating that a new Rx is needed for Oxycodonetablets, instead of the capsules.  Cb# [phone].  Please advise.  Thank you.

## 2022-05-07 ENCOUNTER — Other Ambulatory Visit: Payer: 59

## 2022-05-08 ENCOUNTER — Other Ambulatory Visit: Payer: Self-pay | Admitting: Physician Assistant

## 2022-05-08 ENCOUNTER — Telehealth: Payer: Self-pay | Admitting: Physician Assistant

## 2022-05-08 MED ORDER — OXYCODONE HCL 5 MG PO TABS: 5.0000 mg | ORAL_TABLET | ORAL | 0 refills | Status: DC | PRN

## 2022-05-11 NOTE — Progress Notes (Signed)
[facility] OFFICE PROGRESS NOTE  Travis Reach, MD [address] 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   PRIOR THERAPY: Soliris (Eculizumab) initially with induction 600 MG IV weekly for 4 weeks and then switched to maintenance treatment 900 MG IV every 2 weeks. First dose of treatment was given on 11/21/2014. Status post 45 cycles.  After cycle #45 the patient received his treatment at outside [facility].  Status post 16 more cycles of treatment.  This was discontinued secondary to worsening symptoms and lack of control of his disease.   CURRENT THERAPY: Ultomiris (Ravulizumab) loading dose 2700 mg/M2 followed by maintenance dose 3300 mg/M2 every 8 weeks.  First dose of treatment September 22, 2018.  He status post the induction dose of the treatment as well as 19 cycles of maintenance therapy.  The first 3 cycles of his treatment were given at the [facility] in [facility].  Starting from cycle #18 the patient will receive his treatment at the [facility].   INTERVAL HISTORY: Travis Mcd[facility] 51 y.o. male returns to the clinic today for a follow-up visit.  The patient was last seen on 03/18/22.  At that time, the patient's labs showed evidence of hemolysis.  The patient was started on prednisone.  Also since last being seen, he states his energy is about the same.  He still has fatigue and low energy.  He denies any fever, chills, night sweats, or weight loss.  Denies any recent nausea.  He reports some dyspnea on exertion, such as climbing the stairs.  He denies any chest pain, cough, or hemoptysis. He denies any vomiting, diarrhea, constipation, or abdominal pain.  He denies any dark urine or jaundice.  Last week the patient was taking Tylenol due to pain bilateral ankle pain for which she is seeing orthopedics.  When he was taking Tylenol last  week, the patient states his girlfriend noticed that his eyes appeared yellow.  The patient reports that he thinks it is secondary to ligaments; however, he cannot afford to have an MRI.  He is compliant with his folic acid and iron supplement.  He is currently on a blood thinner for DVT prophylaxis.   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  MEDICATIONS:  Current Outpatient Medications  Medication Sig Dispense Refill   ALPRAZolam (XANAX) 1 MG tablet Take 1 mg by mouth 3 (three) times daily.  2   lisinopril-hydrochlorothiazide (PRINZIDE,ZESTORETIC) 20-25 MG tablet Take 1 tablet by mouth daily.     Multiple Vitamins-Minerals (MULTIVITAMIN ADULT PO) Take by mouth.     OVER THE COUNTER MEDICATION Take 1 tablet by mouth daily. OTC potassium     oxyCODONE (OXY IR/ROXICODONE) 5 MG immediate release tablet Take 1 tablet (5 mg total) by mouth every 4 (four) hours as needed for severe pain. 20 tablet 0   predniSONE (DELTASONE) 10 MG tablet Take 1 tablet (10 mg total) by mouth daily with breakfast. 14 tablet 0   Psyllium (METAMUCIL PO) Take 1 capsule by mouth daily.     tetrahydrozoline 0.05 % ophthalmic solution Place 1 drop into both eyes 2 (two) times daily as needed (eye irritation).     traMADol (ULTRAM) 50 MG  tablet Take 50 mg by mouth 2 (two) times daily.     apixaban (ELIQUIS) 2.5 MG TABS tablet Take 1 tablet (2.5 mg total) by mouth 2 (two) times daily. 60 tablet 2   No current facility-administered medications for this visit.    SURGICAL HISTORY: No past surgical history on file.  REVIEW OF SYSTEMS:   Review of Systems  Constitutional: Positive for fatigue. Negative for appetite change, chills, fever and unexpected weight change.  HENT: Negative for mouth sores, nosebleeds, sore throat and trouble  swallowing.   Eyes: Negative for eye problems and icterus.  Respiratory: Positive for dyspnea on exertion.  Negative for cough, hemoptysis, and wheezing.   Cardiovascular: Negative for chest pain and leg swelling.  Gastrointestinal: Negative for abdominal pain, constipation, diarrhea, nausea and vomiting.  Genitourinary: Negative for bladder incontinence, difficulty urinating, dysuria, frequency and hematuria.   Musculoskeletal: Negative for back pain, gait problem, neck pain and neck stiffness.  Skin: Negative for itching and rash.  Neurological: Negative for dizziness, extremity weakness, gait problem, headaches, light-headedness and seizures.  Hematological: Negative for adenopathy. Does not bruise/bleed easily.  Psychiatric/Behavioral: Negative for confusion, depression and sleep disturbance. The patient is not nervous/anxious.     PHYSICAL EXAMINATION:  Blood pressure 125/68, pulse (!) 116, temperature 99.8 F (37.7 C), temperature source Oral, resp. rate 16, weight 172 lb (78 kg), SpO2 100 %.  ECOG PERFORMANCE STATUS: 1  Physical Exam  Constitutional: Oriented to person, place, and time and well-developed, well-nourished, and in no distress.  HENT:  Head: Normocephalic and atraumatic.  Mouth/Throat: Oropharynx is clear and moist. No oropharyngeal exudate.  Eyes: Conjunctivae are normal. Right eye exhibits no discharge. Left eye exhibits no discharge. No scleral icterus.  Neck: Normal range of motion. Neck supple.  Cardiovascular: Normal rate, regular rhythm, normal heart sounds and intact distal pulses.   Pulmonary/Chest: Effort normal and breath sounds normal. No respiratory distress. No wheezes. No rales.  Abdominal: Soft. Bowel sounds are normal. Exhibits no distension and no mass. There is no tenderness.  Musculoskeletal: Normal range of motion. Exhibits no edema.  Lymphadenopathy:    No cervical adenopathy.  Neurological: Alert and oriented to person, place, and time.  Exhibits normal muscle tone. Gait normal. Coordination normal.  Skin: Skin is warm and dry. No rash noted. Not diaphoretic. No erythema. No pallor.  Psychiatric: Mood, memory and judgment normal.  Vitals reviewed.  LABORATORY DATA: Lab Results  Component Value Date   WBC 6.2 05/14/2022   HGB 9.3 (L) 05/14/2022   HCT 28.3 (L) 05/14/2022   MCV 120.9 (H) 05/14/2022   PLT 193 05/14/2022      Chemistry      Component Value Date/Time   NA 135 05/14/2022 0747   NA 136 06/28/2017 1236   K 3.7 05/14/2022 0747   K 4.1 06/28/2017 1236   CL 101 05/14/2022 0747   CO2 24 05/14/2022 0747   CO2 24 06/28/2017 1236   BUN 11 05/14/2022 0747   BUN 17.1 06/28/2017 1236   CREATININE 1.01 05/14/2022 0747   CREATININE 1.0 06/28/2017 1236      Component Value Date/Time   CALCIUM 9.5 05/14/2022 0747   CALCIUM 9.1 06/28/2017 1236   ALKPHOS 55 05/14/2022 0747   ALKPHOS 66 06/28/2017 1236   AST 97 (H) 05/14/2022 0747   AST 40 (H) 06/28/2017 1236   ALT 134 (H) 05/14/2022 0747   ALT 60 (H) 06/28/2017 1236   BILITOT 3.3 (H) 05/14/2022 0747   BILITOT 2.87 (H)  06/28/2017 1236       RADIOGRAPHIC STUDIES:  No results found.   ASSESSMENT/PLAN:  This is a very pleasant 51 year old Caucasian male with paroxysmal nocturnal hemoglobinuria.  He been on treatment with Solaris on days 1 and 15 every 4 weeks.  He is status post 45 cycles.  He received an additional 19 cycles an outside [facility].  He previously completed a tapering dose of prednisone that was started on September/October 2019 for an episode of hemolytic anemia. His last prednisone taper was in November 2021.    Due to convenience and his previous treatment with Solaris interfering with his work schedule/performance, he was interested in starting Ultomiris.  His insurance requires him to receive treatment from an outside infusion [facility] due to the facility fee here at the [facility].  His first dose was on May 6th, 2020. Status post  induction dose followed by maintenance therapy.  He is currently receiving his infusion of the Ultomiris at the [facility]. tarting from cycle #18 he received his treatment at the [facility] in [place].   He continues to tolerate his treatment well with no concerning adverse effects  The patient was seen with Dr. Julien Nordmann. Labs were reviewed. His Hbg is 9.3. Bili elevated at 3.3. His LDH is elevated.  Dr. Julien Nordmann recommends that he started on prednisone 10 mg p.o. daily for the next 2 weeks.  We will check his labs back in approximately 1 month. We will check his B12 at hsi next lab visit. The patient is taking folic acid.   Recommend that he proceed with cycle #21 today's schedule.  For DVT prophylaxis, he will continue on Eliquis 2.5 mg twice daily.  I sent a refill to the pharmacy.  We will see him back for follow-up visit in 8 weeks for evaluation repeat blood work before undergoing cycle #22.   Dr. Julien Nordmann discussed that if no improvement, he may consider switching his treatment.  The patient was advised to call immediately if he has any concerning symptoms in the interval. The patient voices understanding of current disease status and treatment options and is in agreement with the current care plan. All questions were answered. The patient knows to call the clinic with any problems, questions or concerns. We can certainly see the patient much sooner if necessary         Orders Placed This Encounter  Procedures   CBC with Differential ([facility] Only)    Standing Status:   Standing    Number of Occurrences:   2    Standing Expiration Date:   05/15/2023   CMP ([facility] only)    Standing Status:   Standing    Number of Occurrences:   2    Standing Expiration Date:   05/15/2023   Lactate dehydrogenase (LDH)    Standing Status:   Standing    Number of Occurrences:   2    Standing Expiration Date:   05/15/2023   Vitamin B12     Standing Status:   Future    Standing Expiration Date:   05/15/2023      Jenessa Gillingham L Karolina Zamor, [name] 05/14/22  ADDENDUM: Hematology/Oncology Attending: I had a face-to-face encounter with the patient today.  I reviewed his record, lab and recommended his care plan.  This is a very pleasant 51 years old white male with history of paroxysmal nocturnal hemoglobinuria (PNH).  The patient started treatment initially with Solaris but this was discontinued after lack  of efficacy in his condition.  He changed treatment to Ultomiris every 8 weeks and has been tolerating this treatment much better but most recently has been complaining of increasing fatigue and weakness in his CBC today showed hemoglobin of 9.3. He continues to have mildly elevated LDH and serum bilirubin. I recommended for the patient to proceed with his treatment with Ultomiris today as planned. I also recommend for the patient to continue on prednisone 10 mg p.o. daily for the next 2 weeks. We will repeat his blood work in 1 months for evaluation and close monitoring of his condition. For the hypertension he will continue on his blood pressure medication as prescribed by Dr. [name]. He will come back for follow-up visit in 8 weeks for evaluation before the next cycle of his treatment. The patient was advised to call immediately if he has any other concerning symptoms in the interval. Disclaimer: This note was dictated with voice recognition software. Similar sounding words can inadvertently be transcribed and may be missed upon review. Eilleen Kempf, MD

## 2022-05-14 ENCOUNTER — Other Ambulatory Visit: Payer: Self-pay

## 2022-05-14 ENCOUNTER — Other Ambulatory Visit: Payer: BC Managed Care – PPO

## 2022-05-14 ENCOUNTER — Inpatient Hospital Stay: Payer: BC Managed Care – PPO | Attending: Internal Medicine

## 2022-05-14 ENCOUNTER — Ambulatory Visit: Payer: BC Managed Care – PPO

## 2022-05-14 ENCOUNTER — Ambulatory Visit: Payer: BC Managed Care – PPO | Admitting: Physician Assistant

## 2022-05-14 ENCOUNTER — Inpatient Hospital Stay: Payer: BC Managed Care – PPO | Admitting: Physician Assistant

## 2022-05-14 ENCOUNTER — Inpatient Hospital Stay: Payer: BC Managed Care – PPO

## 2022-05-14 VITALS — BP 125/68 | HR 116 | Temp 99.8°F | Resp 16 | Wt 172.0 lb

## 2022-05-14 VITALS — BP 103/60 | HR 87 | Temp 98.3°F | Resp 18

## 2022-05-14 DIAGNOSIS — R7402 Elevation of levels of lactic acid dehydrogenase (LDH): Secondary | ICD-10-CM | POA: Insufficient documentation

## 2022-05-14 DIAGNOSIS — Z7901 Long term (current) use of anticoagulants: Secondary | ICD-10-CM | POA: Diagnosis not present

## 2022-05-14 DIAGNOSIS — D595 Paroxysmal nocturnal hemoglobinuria [Marchiafava-Micheli]: Secondary | ICD-10-CM

## 2022-05-14 DIAGNOSIS — Z5111 Encounter for antineoplastic chemotherapy: Secondary | ICD-10-CM

## 2022-05-14 LAB — CBC WITH DIFFERENTIAL (CANCER CENTER ONLY)
Abs Immature Granulocytes: 0.11 10*3/uL — ABNORMAL HIGH (ref 0.00–0.07)
Basophils Absolute: 0 10*3/uL (ref 0.0–0.1)
Basophils Relative: 1 %
Eosinophils Absolute: 0.1 10*3/uL (ref 0.0–0.5)
Eosinophils Relative: 1 %
HCT: 28.3 % — ABNORMAL LOW (ref 39.0–52.0)
Hemoglobin: 9.3 g/dL — ABNORMAL LOW (ref 13.0–17.0)
Immature Granulocytes: 2 %
Lymphocytes Relative: 17 %
Lymphs Abs: 1 10*3/uL (ref 0.7–4.0)
MCH: 39.7 pg — ABNORMAL HIGH (ref 26.0–34.0)
MCHC: 32.9 g/dL (ref 30.0–36.0)
MCV: 120.9 fL — ABNORMAL HIGH (ref 80.0–100.0)
Monocytes Absolute: 0.4 10*3/uL (ref 0.1–1.0)
Monocytes Relative: 7 %
Neutro Abs: 4.5 10*3/uL (ref 1.7–7.7)
Neutrophils Relative %: 72 %
Platelet Count: 193 10*3/uL (ref 150–400)
RBC: 2.34 MIL/uL — ABNORMAL LOW (ref 4.22–5.81)
RDW: 17.2 % — ABNORMAL HIGH (ref 11.5–15.5)
WBC Count: 6.2 10*3/uL (ref 4.0–10.5)
nRBC: 1.1 % — ABNORMAL HIGH (ref 0.0–0.2)

## 2022-05-14 LAB — CMP (CANCER CENTER ONLY)
ALT: 134 U/L — ABNORMAL HIGH (ref 0–44)
AST: 97 U/L — ABNORMAL HIGH (ref 15–41)
Albumin: 4.5 g/dL (ref 3.5–5.0)
Alkaline Phosphatase: 55 U/L (ref 38–126)
Anion gap: 10 (ref 5–15)
BUN: 11 mg/dL (ref 6–20)
CO2: 24 mmol/L (ref 22–32)
Calcium: 9.5 mg/dL (ref 8.9–10.3)
Chloride: 101 mmol/L (ref 98–111)
Creatinine: 1.01 mg/dL (ref 0.61–1.24)
GFR, Estimated: >60 mL/min (ref >60)
Glucose, Bld: 128 mg/dL — ABNORMAL HIGH (ref 70–99)
Potassium: 3.7 mmol/L (ref 3.5–5.1)
Sodium: 135 mmol/L (ref 135–145)
Total Bilirubin: 3.3 mg/dL — ABNORMAL HIGH (ref 0.3–1.2)
Total Protein: 7.2 g/dL (ref 6.5–8.1)

## 2022-05-14 LAB — LACTATE DEHYDROGENASE: LDH: 226 U/L — ABNORMAL HIGH (ref 98–192)

## 2022-05-14 MED ORDER — APIXABAN 2.5 MG PO TABS: 2.5000 mg | ORAL_TABLET | Freq: Two times a day (BID) | ORAL | 2 refills | Status: DC

## 2022-05-14 MED ORDER — SODIUM CHLORIDE 0.9 % IV SOLN: Freq: Once | INTRAVENOUS | Status: AC

## 2022-05-14 MED ORDER — SODIUM CHLORIDE 0.9 % IV SOLN
3300.0000 mg | Freq: Once | INTRAVENOUS | Status: AC
  Administered 2022-05-14: 3300 mg via INTRAVENOUS
  Filled 2022-05-14: qty 33

## 2022-05-14 MED ORDER — PREDNISONE 10 MG PO TABS: 10.0000 mg | ORAL_TABLET | Freq: Every day | ORAL | 0 refills | Status: DC

## 2022-05-14 NOTE — Patient Instructions (Signed)
[facility]  Discharge Instructions: Thank you for choosing [facility] to provide your oncology and hematology care.   If you have a lab appointment with the [facility], please go directly to the [facility] and check in at the registration area.   Wear comfortable clothing and clothing appropriate for easy access to any Portacath or PICC line.   We strive to give you quality time with your provider. You may need to reschedule your appointment if you arrive late (15 or more minutes).  Arriving late affects you and other patients whose appointments are after yours.  Also, if you miss three or more appointments without notifying the office, you may be dismissed from the clinic at the provider's discretion.      For prescription refill requests, have your pharmacy contact our office and allow 72 hours for refills to be completed.    Today you received the following chemotherapy and/or immunotherapy agents ultomiris      To help prevent nausea and vomiting after your treatment, we encourage you to take your nausea medication as directed.  BELOW ARE SYMPTOMS THAT SHOULD BE REPORTED IMMEDIATELY: *FEVER GREATER THAN 100.4 F (38 C) OR HIGHER *CHILLS OR SWEATING *NAUSEA AND VOMITING THAT IS NOT CONTROLLED WITH YOUR NAUSEA MEDICATION *UNUSUAL SHORTNESS OF BREATH *UNUSUAL BRUISING OR BLEEDING *URINARY PROBLEMS (pain or burning when urinating, or frequent urination) *BOWEL PROBLEMS (unusual diarrhea, constipation, pain near the anus) TENDERNESS IN MOUTH AND THROAT WITH OR WITHOUT PRESENCE OF ULCERS (sore throat, sores in mouth, or a toothache) UNUSUAL RASH, SWELLING OR PAIN  UNUSUAL VAGINAL DISCHARGE OR ITCHING   Items with * indicate a potential emergency and should be followed up as soon as possible or go to the Emergency Department if any problems should occur.  Please show the CHEMOTHERAPY ALERT CARD or IMMUNOTHERAPY ALERT CARD at check-in to  the Emergency Department and triage nurse.  Should you have questions after your visit or need to cancel or reschedule your appointment, please contact [facility]  Dept: [phone]  and follow the prompts.  Office hours are 8:00 a.m. to 4:30 p.m. Monday - Friday. Please note that voicemails left after 4:00 p.m. may not be returned until the following business day.  We are closed weekends and major holidays. You have access to a nurse at all times for urgent questions. Please call the main number to the clinic Dept: [phone] and follow the prompts.   For any non-urgent questions, you may also contact your provider using MyChart. We now offer e-Visits for anyone 2 and older to request care online for non-urgent symptoms. For details visit [email or web address].   Also download the MyChart app! Go to the app store, search "MyChart", open the app, select [facility], and log in with your MyChart username and password.  Masks are optional in the cancer centers. If you would like for your care team to wear a mask while they are taking care of you, please let them know. You may have one support person who is at least 53 years old accompany you for your appointments.

## 2022-05-14 NOTE — Progress Notes (Signed)
Patient declines to stay for observation following Ultomiris infusion. He reports he has had this infusion many times without incident. No concerns at this time. VSS at discharge.

## 2022-05-15 ENCOUNTER — Encounter: Payer: Self-pay | Admitting: Internal Medicine

## 2022-06-03 ENCOUNTER — Telehealth: Payer: Self-pay | Admitting: Internal Medicine

## 2022-06-03 NOTE — Telephone Encounter (Signed)
Called patient regarding all upcoming appointments, patient is notified. 

## 2022-06-04 ENCOUNTER — Other Ambulatory Visit: Payer: 59

## 2022-06-11 ENCOUNTER — Inpatient Hospital Stay: Payer: BC Managed Care – PPO | Attending: Internal Medicine

## 2022-06-11 ENCOUNTER — Telehealth: Payer: Self-pay | Admitting: Physician Assistant

## 2022-06-11 ENCOUNTER — Other Ambulatory Visit: Payer: BC Managed Care – PPO

## 2022-06-11 ENCOUNTER — Encounter: Payer: Self-pay | Admitting: Physician Assistant

## 2022-06-11 DIAGNOSIS — D595 Paroxysmal nocturnal hemoglobinuria [Marchiafava-Micheli]: Secondary | ICD-10-CM | POA: Insufficient documentation

## 2022-06-11 DIAGNOSIS — R7402 Elevation of levels of lactic acid dehydrogenase (LDH): Secondary | ICD-10-CM | POA: Diagnosis not present

## 2022-06-11 LAB — CBC WITH DIFFERENTIAL (CANCER CENTER ONLY)
Abs Immature Granulocytes: 0.04 10*3/uL (ref 0.00–0.07)
Basophils Absolute: 0 10*3/uL (ref 0.0–0.1)
Basophils Relative: 0 %
Eosinophils Absolute: 0 10*3/uL (ref 0.0–0.5)
Eosinophils Relative: 0 %
HCT: 32.6 % — ABNORMAL LOW (ref 39.0–52.0)
Hemoglobin: 11.4 g/dL — ABNORMAL LOW (ref 13.0–17.0)
Immature Granulocytes: 1 %
Lymphocytes Relative: 12 %
Lymphs Abs: 0.9 10*3/uL (ref 0.7–4.0)
MCH: 39.6 pg — ABNORMAL HIGH (ref 26.0–34.0)
MCHC: 35 g/dL (ref 30.0–36.0)
MCV: 113.2 fL — ABNORMAL HIGH (ref 80.0–100.0)
Monocytes Absolute: 0.7 10*3/uL (ref 0.1–1.0)
Monocytes Relative: 10 %
Neutro Abs: 5.4 10*3/uL (ref 1.7–7.7)
Neutrophils Relative %: 77 %
Platelet Count: 145 10*3/uL — ABNORMAL LOW (ref 150–400)
RBC: 2.88 MIL/uL — ABNORMAL LOW (ref 4.22–5.81)
RDW: 13.7 % (ref 11.5–15.5)
WBC Count: 7 10*3/uL (ref 4.0–10.5)
nRBC: 0 % (ref 0.0–0.2)

## 2022-06-11 LAB — CMP (CANCER CENTER ONLY)
ALT: 83 U/L — ABNORMAL HIGH (ref 0–44)
AST: 57 U/L — ABNORMAL HIGH (ref 15–41)
Albumin: 4.3 g/dL (ref 3.5–5.0)
Alkaline Phosphatase: 53 U/L (ref 38–126)
Anion gap: 8 (ref 5–15)
BUN: 11 mg/dL (ref 6–20)
CO2: 27 mmol/L (ref 22–32)
Calcium: 9.7 mg/dL (ref 8.9–10.3)
Chloride: 101 mmol/L (ref 98–111)
Creatinine: 0.81 mg/dL (ref 0.61–1.24)
GFR, Estimated: >60 mL/min (ref >60)
Glucose, Bld: 133 mg/dL — ABNORMAL HIGH (ref 70–99)
Potassium: 3.8 mmol/L (ref 3.5–5.1)
Sodium: 136 mmol/L (ref 135–145)
Total Bilirubin: 3.4 mg/dL — ABNORMAL HIGH (ref 0.3–1.2)
Total Protein: 6.6 g/dL (ref 6.5–8.1)

## 2022-06-11 LAB — LACTATE DEHYDROGENASE: LDH: 201 U/L — ABNORMAL HIGH (ref 98–192)

## 2022-06-11 LAB — VITAMIN B12: Vitamin B-12: 702 pg/mL (ref 180–914)

## 2022-06-11 NOTE — Telephone Encounter (Signed)
I talked to pt and he wants a refill on Meloxicam and oxycodone. Can't have MRI till January. Can he also have a handicap.

## 2022-06-11 NOTE — Telephone Encounter (Signed)
done

## 2022-06-11 NOTE — Telephone Encounter (Signed)
Pt called requesting a call back from PA Audrea Muscat he states he need to speak to her about his upcoming MRI. Please cal pt at [phone]

## 2022-06-12 ENCOUNTER — Ambulatory Visit: Payer: BC Managed Care – PPO | Admitting: Physician Assistant

## 2022-06-14 ENCOUNTER — Other Ambulatory Visit: Payer: Self-pay | Admitting: Internal Medicine

## 2022-06-14 DIAGNOSIS — D595 Paroxysmal nocturnal hemoglobinuria [Marchiafava-Micheli]: Secondary | ICD-10-CM

## 2022-07-07 ENCOUNTER — Telehealth: Payer: Self-pay | Admitting: Medical Oncology

## 2022-07-07 NOTE — Telephone Encounter (Signed)
Eloquis pt assistance application left up front for pt to pick up.

## 2022-07-07 NOTE — Telephone Encounter (Signed)
Cannot afford to buy Eloquis -$500. He will pick up pt assistance application on Thursday. He will start a daily low dose aspirin today . Application left up front for pt pickup.

## 2022-07-09 ENCOUNTER — Ambulatory Visit: Payer: BC Managed Care – PPO | Admitting: Internal Medicine

## 2022-07-09 ENCOUNTER — Ambulatory Visit: Payer: BC Managed Care – PPO

## 2022-07-09 ENCOUNTER — Telehealth: Payer: Self-pay

## 2022-07-09 ENCOUNTER — Inpatient Hospital Stay: Payer: BC Managed Care – PPO

## 2022-07-09 ENCOUNTER — Inpatient Hospital Stay: Payer: BC Managed Care – PPO | Attending: Internal Medicine

## 2022-07-09 ENCOUNTER — Inpatient Hospital Stay: Payer: BC Managed Care – PPO | Admitting: Internal Medicine

## 2022-07-09 ENCOUNTER — Other Ambulatory Visit: Payer: Self-pay

## 2022-07-09 ENCOUNTER — Other Ambulatory Visit: Payer: BC Managed Care – PPO

## 2022-07-09 ENCOUNTER — Other Ambulatory Visit: Payer: Self-pay | Admitting: Physician Assistant

## 2022-07-09 DIAGNOSIS — D649 Anemia, unspecified: Secondary | ICD-10-CM | POA: Insufficient documentation

## 2022-07-09 DIAGNOSIS — Z7982 Long term (current) use of aspirin: Secondary | ICD-10-CM | POA: Insufficient documentation

## 2022-07-09 DIAGNOSIS — D595 Paroxysmal nocturnal hemoglobinuria [Marchiafava-Micheli]: Secondary | ICD-10-CM | POA: Diagnosis present

## 2022-07-09 DIAGNOSIS — D696 Thrombocytopenia, unspecified: Secondary | ICD-10-CM | POA: Insufficient documentation

## 2022-07-09 DIAGNOSIS — M25571 Pain in right ankle and joints of right foot: Secondary | ICD-10-CM | POA: Diagnosis not present

## 2022-07-09 LAB — CMP (CANCER CENTER ONLY)
ALT: 109 U/L — ABNORMAL HIGH (ref 0–44)
AST: 76 U/L — ABNORMAL HIGH (ref 15–41)
Albumin: 4.4 g/dL (ref 3.5–5.0)
Alkaline Phosphatase: 48 U/L (ref 38–126)
Anion gap: 10 (ref 5–15)
BUN: 17 mg/dL (ref 6–20)
CO2: 23 mmol/L (ref 22–32)
Calcium: 9.3 mg/dL (ref 8.9–10.3)
Chloride: 100 mmol/L (ref 98–111)
Creatinine: 0.86 mg/dL (ref 0.61–1.24)
GFR, Estimated: >60 mL/min (ref >60)
Glucose, Bld: 135 mg/dL — ABNORMAL HIGH (ref 70–99)
Potassium: 3.6 mmol/L (ref 3.5–5.1)
Sodium: 133 mmol/L — ABNORMAL LOW (ref 135–145)
Total Bilirubin: 4.6 mg/dL (ref 0.3–1.2)
Total Protein: 7.1 g/dL (ref 6.5–8.1)

## 2022-07-09 LAB — CBC WITH DIFFERENTIAL (CANCER CENTER ONLY)
Abs Immature Granulocytes: 0.02 10*3/uL (ref 0.00–0.07)
Basophils Absolute: 0 10*3/uL (ref 0.0–0.1)
Basophils Relative: 0 %
Eosinophils Absolute: 0.1 10*3/uL (ref 0.0–0.5)
Eosinophils Relative: 1 %
HCT: 31.2 % — ABNORMAL LOW (ref 39.0–52.0)
Hemoglobin: 11.1 g/dL — ABNORMAL LOW (ref 13.0–17.0)
Immature Granulocytes: 0 %
Lymphocytes Relative: 15 %
Lymphs Abs: 1.1 10*3/uL (ref 0.7–4.0)
MCH: 40.5 pg — ABNORMAL HIGH (ref 26.0–34.0)
MCHC: 35.6 g/dL (ref 30.0–36.0)
MCV: 113.9 fL — ABNORMAL HIGH (ref 80.0–100.0)
Monocytes Absolute: 0.9 10*3/uL (ref 0.1–1.0)
Monocytes Relative: 12 %
Neutro Abs: 5.2 10*3/uL (ref 1.7–7.7)
Neutrophils Relative %: 72 %
Platelet Count: 149 10*3/uL — ABNORMAL LOW (ref 150–400)
RBC: 2.74 MIL/uL — ABNORMAL LOW (ref 4.22–5.81)
RDW: 14.1 % (ref 11.5–15.5)
WBC Count: 7.2 10*3/uL (ref 4.0–10.5)
nRBC: 0 % (ref 0.0–0.2)

## 2022-07-09 LAB — LACTATE DEHYDROGENASE: LDH: 276 U/L — ABNORMAL HIGH (ref 98–192)

## 2022-07-09 MED ORDER — SODIUM CHLORIDE 0.9 % IV SOLN: Freq: Once | INTRAVENOUS | Status: AC

## 2022-07-09 MED ORDER — SODIUM CHLORIDE 0.9 % IV SOLN
3300.0000 mg | Freq: Once | INTRAVENOUS | Status: AC
  Administered 2022-07-09: 3300 mg via INTRAVENOUS
  Filled 2022-07-09: qty 33

## 2022-07-09 MED ORDER — PREDNISONE 10 MG PO TABS: 10.0000 mg | ORAL_TABLET | Freq: Every day | ORAL | 0 refills | Status: DC

## 2022-07-09 MED ORDER — ACETAMINOPHEN 325 MG PO TABS
650.0000 mg | ORAL_TABLET | Freq: Once | ORAL | Status: AC
  Administered 2022-07-09: 650 mg via ORAL
  Filled 2022-07-09: qty 2

## 2022-07-09 NOTE — Progress Notes (Addendum)
Per Dr. Julien Nordmann , it is ok to treat pt today with [facility] Ravulizumab -CWVZ and heart rate of 115.

## 2022-07-09 NOTE — Telephone Encounter (Signed)
CRITICAL VALUE STICKER  CRITICAL VALUE: TOTAL BILIRUBIN: 4.6  RECEIVER (on-site recipient of call): Kristine Tiley P. LPN  DATE & TIME NOTIFIED: 07/09/2022 8:50 am   MESSENGER (representative from lab): Janett Billow   MD NOTIFIED: Cassandra Heilingoetter, [name]

## 2022-07-09 NOTE — Progress Notes (Signed)
[facility] Telephone:([phone]   Fax:([phone]  OFFICE PROGRESS NOTE  [name], MD [address] 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  PRIOR THERAPY:  Soliris (Eculizumab) initially with induction 600 MG IV weekly for 4 weeks and then switched to maintenance treatment 900 MG IV every 2 weeks. First dose of treatment was given on 11/21/2014. Status post 45 cycles.  After cycle #45 the patient received his treatment at outside infusion center.  Status post 16 more cycles of treatment.  This was discontinued secondary to worsening symptoms and lack of control of his disease.  CURRENT THERAPY: Ultomiris (Ravulizumab) loading dose 2700 mg/M2 followed by maintenance dose 3300 mg/M2 every 8 weeks.  First dose of treatment September 22, 2018.  He status post the induction dose of the treatment as well as 21 cycles of maintenance therapy.  The first 3 cycles of his treatment were given at the infusion center [facility].  Starting from cycle #18 the patient will receive his treatment at the [facility].  INTERVAL HISTORY: Travis Mcd[facility] 52 y.o. male returns to the clinic today for follow-up visit.  The patient continues to complain of pain in the right ankle and knees.  He is followed by orthopedic surgery and has been receiving pain medication from them but he was trying to save money and receive pain medication from [place] today which I declined.  He denied having any current chest pain, shortness of breath, cough or hemoptysis.  He denied having any fever or chills.  He has no nausea, vomiting, diarrhea or constipation.  He has no headache or visual changes.  He has been tolerating his treatment with Ultomiris fairly well.  He is here for evaluation before starting cycle #22 of his treatment.  He did stop taking his Eliquis because he could  not pay for it.  He is currently taking aspirin as a replacement.   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  MEDICATIONS:  Current Outpatient Medications  Medication Sig Dispense Refill   ALPRAZolam (XANAX) 1 MG tablet Take 1 mg by mouth 3 (three) times daily.  2   apixaban (ELIQUIS) 2.5 MG TABS tablet Take 1 tablet (2.5 mg total) by mouth 2 (two) times daily. 60 tablet 2   lisinopril-hydrochlorothiazide (PRINZIDE,ZESTORETIC) 20-25 MG tablet Take 1 tablet by mouth daily.     Multiple Vitamins-Minerals (MULTIVITAMIN ADULT PO) Take by mouth.     OVER THE COUNTER MEDICATION Take 1 tablet by mouth daily. OTC potassium     oxyCODONE (OXY IR/ROXICODONE) 5 MG immediate release tablet Take 1 tablet (5 mg total) by mouth every 4 (four) hours as needed for severe pain. 20 tablet 0   predniSONE (DELTASONE) 10 MG tablet Take 1 tablet (10 mg total) by mouth daily with breakfast. 14 tablet 0   Psyllium (METAMUCIL PO) Take 1 capsule by mouth daily.     tetrahydrozoline 0.05 % ophthalmic solution Place 1 drop into both eyes 2 (two) times daily as needed (eye irritation).     traMADol (ULTRAM) 50 MG tablet Take 50 mg by mouth 2 (two) times daily.     No current facility-administered medications for this  visit.    SURGICAL HISTORY: No past surgical history on file.  REVIEW OF SYSTEMS:  A comprehensive review of systems was negative except for: Constitutional: positive for fatigue Musculoskeletal: positive for arthralgias   PHYSICAL EXAMINATION: General appearance: alert, cooperative, fatigued, icteric, and no distress Head: Normocephalic, without obvious abnormality, atraumatic Neck: no adenopathy, no JVD, supple, symmetrical, trachea midline, and thyroid not enlarged, symmetric, no tenderness/mass/nodules Lymph nodes:  Cervical, supraclavicular, and axillary nodes normal. Resp: clear to auscultation bilaterally Back: symmetric, no curvature. ROM normal. No CVA tenderness. Cardio: regular rate and rhythm, S1, S2 normal, no murmur, click, rub or gallop GI: soft, non-tender; bowel sounds normal; no masses,  no organomegaly Extremities: extremities normal, atraumatic, no cyanosis or edema  ECOG PERFORMANCE STATUS: 1 - Symptomatic but completely ambulatory  Blood pressure 132/73, pulse (!) 123, temperature 98.4 F (36.9 C), temperature source Oral, resp. rate 15, weight 173 lb 12.8 oz (78.8 kg), SpO2 100 %.  LABORATORY DATA: Lab Results  Component Value Date   WBC 7.0 06/11/2022   HGB 11.4 (L) 06/11/2022   HCT 32.6 (L) 06/11/2022   MCV 113.2 (H) 06/11/2022   PLT 145 (L) 06/11/2022      Chemistry      Component Value Date/Time   NA 136 06/11/2022 0732   NA 136 06/28/2017 1236   K 3.8 06/11/2022 0732   K 4.1 06/28/2017 1236   CL 101 06/11/2022 0732   CO2 27 06/11/2022 0732   CO2 24 06/28/2017 1236   BUN 11 06/11/2022 0732   BUN 17.1 06/28/2017 1236   CREATININE 0.81 06/11/2022 0732   CREATININE 1.0 06/28/2017 1236      Component Value Date/Time   CALCIUM 9.7 06/11/2022 0732   CALCIUM 9.1 06/28/2017 1236   ALKPHOS 53 06/11/2022 0732   ALKPHOS 66 06/28/2017 1236   AST 57 (H) 06/11/2022 0732   AST 40 (H) 06/28/2017 1236   ALT 83 (H) 06/11/2022 0732   ALT 60 (H) 06/28/2017 1236   BILITOT 3.4 (H) 06/11/2022 0732   BILITOT 2.87 (H) 06/28/2017 1236       RADIOGRAPHIC STUDIES: No results found.  ASSESSMENT AND PLAN:  This is a very pleasant 52 years old white male with paroxysmal nocturnal hemoglobinuria and currently on treatment with Soliris on days 1 and 15 every 4 weeks status post 45 cycles.  He is also on the same treatment received at outside infusion center status post 19 more cycles. He also completed a course of tapered dose of prednisone. He is currently on treatment with  Ultomiris status post induction dose followed by 21 cycles of maintenance therapy.  He was receiving his treatment at the [facility] infusion center but because of significant lack of payment they declined to give him the infusions there anymore.  Starting from cycle #18 he received his treatment at the [facility] in [place]. The patient has been tolerating this treatment well with no concerning adverse effects. Repeat CBC today showed mild anemia and thrombocytopenia.  Comprehensive metabolic panel and LDH are still pending. I recommended for the patient to continue his treatment with Ultomiris and he will proceed with cycle #22 today. I will see him back for follow-up visit in 8 weeks for evaluation before the next cycle of his treatment. For the deep venous thrombosis, he is currently out of Eliquis.  He will apply for patient assistance to receive the medication but for the time being he will continue on aspirin daily. For the arthralgia and  pain in his ankle and knees, he will follow by orthopedic surgery.  I explained to the patient that I will not be able to refill his pain medication. He was advised to call immediately if he has any other concerning symptoms in the interval.  The patient voices understanding of current disease status and treatment options and is in agreement with the current care plan. All questions were answered. The patient knows to call the clinic with any problems, questions or concerns. We can certainly see the patient much sooner if necessary.  Disclaimer: This note was dictated with voice recognition software. Similar sounding words can inadvertently be transcribed and may not be corrected upon review.

## 2022-07-12 ENCOUNTER — Other Ambulatory Visit: Payer: Self-pay

## 2022-07-12 ENCOUNTER — Encounter (HOSPITAL_COMMUNITY): Payer: Self-pay | Admitting: Emergency Medicine

## 2022-07-12 ENCOUNTER — Emergency Department (HOSPITAL_COMMUNITY)
Admission: EM | Admit: 2022-07-12 | Discharge: 2022-07-12 | Discharge status: Against Medical Advice | Payer: BC Managed Care – PPO | Attending: Emergency Medicine | Admitting: Emergency Medicine

## 2022-07-12 DIAGNOSIS — R630 Anorexia: Secondary | ICD-10-CM | POA: Diagnosis not present

## 2022-07-12 DIAGNOSIS — R197 Diarrhea, unspecified: Secondary | ICD-10-CM | POA: Insufficient documentation

## 2022-07-12 DIAGNOSIS — R112 Nausea with vomiting, unspecified: Secondary | ICD-10-CM | POA: Insufficient documentation

## 2022-07-12 DIAGNOSIS — Z5321 Procedure and treatment not carried out due to patient leaving prior to being seen by health care provider: Secondary | ICD-10-CM | POA: Insufficient documentation

## 2022-07-12 LAB — CBC WITH DIFFERENTIAL/PLATELET
Abs Immature Granulocytes: 0.05 10*3/uL (ref 0.00–0.07)
Basophils Absolute: 0.1 10*3/uL (ref 0.0–0.1)
Basophils Relative: 1 %
Eosinophils Absolute: 0 10*3/uL (ref 0.0–0.5)
Eosinophils Relative: 0 %
HCT: 34 % — ABNORMAL LOW (ref 39.0–52.0)
Hemoglobin: 11.5 g/dL — ABNORMAL LOW (ref 13.0–17.0)
Immature Granulocytes: 1 %
Lymphocytes Relative: 9 %
Lymphs Abs: 0.7 10*3/uL (ref 0.7–4.0)
MCH: 38.2 pg — ABNORMAL HIGH (ref 26.0–34.0)
MCHC: 33.8 g/dL (ref 30.0–36.0)
MCV: 113 fL — ABNORMAL HIGH (ref 80.0–100.0)
Monocytes Absolute: 0.7 10*3/uL (ref 0.1–1.0)
Monocytes Relative: 8 %
Neutro Abs: 6.9 10*3/uL (ref 1.7–7.7)
Neutrophils Relative %: 81 %
Platelets: 232 10*3/uL (ref 150–400)
RBC: 3.01 MIL/uL — ABNORMAL LOW (ref 4.22–5.81)
RDW: 14.1 % (ref 11.5–15.5)
WBC: 8.4 10*3/uL (ref 4.0–10.5)
nRBC: 0.5 % — ABNORMAL HIGH (ref 0.0–0.2)

## 2022-07-12 LAB — LIPASE, BLOOD: Lipase: 42 U/L (ref 11–51)

## 2022-07-12 LAB — URINALYSIS, ROUTINE W REFLEX MICROSCOPIC
Bilirubin Urine: NEGATIVE
Glucose, UA: NEGATIVE mg/dL
Hgb urine dipstick: NEGATIVE
Ketones, ur: NEGATIVE mg/dL
Leukocytes,Ua: NEGATIVE
Nitrite: NEGATIVE
Protein, ur: NEGATIVE mg/dL
Specific Gravity, Urine: 1.006 (ref 1.005–1.030)
pH: 6 (ref 5.0–8.0)

## 2022-07-12 LAB — COMPREHENSIVE METABOLIC PANEL
ALT: 204 U/L — ABNORMAL HIGH (ref 0–44)
AST: 147 U/L — ABNORMAL HIGH (ref 15–41)
Albumin: 4.5 g/dL (ref 3.5–5.0)
Alkaline Phosphatase: 57 U/L (ref 38–126)
Anion gap: 11 (ref 5–15)
BUN: 12 mg/dL (ref 6–20)
CO2: 21 mmol/L — ABNORMAL LOW (ref 22–32)
Calcium: 9.4 mg/dL (ref 8.9–10.3)
Chloride: 97 mmol/L — ABNORMAL LOW (ref 98–111)
Creatinine, Ser: 0.87 mg/dL (ref 0.61–1.24)
GFR, Estimated: >60 mL/min (ref >60)
Glucose, Bld: 117 mg/dL — ABNORMAL HIGH (ref 70–99)
Potassium: 3.9 mmol/L (ref 3.5–5.1)
Sodium: 129 mmol/L — ABNORMAL LOW (ref 135–145)
Total Bilirubin: 3.1 mg/dL — ABNORMAL HIGH (ref 0.3–1.2)
Total Protein: 7.9 g/dL (ref 6.5–8.1)

## 2022-07-12 LAB — LACTIC ACID, PLASMA: Lactic Acid, Venous: 1 mmol/L (ref 0.5–1.9)

## 2022-07-12 NOTE — ED Provider Triage Note (Signed)
Emergency Medicine Provider Triage Evaluation Note  Travis Mcdaniel , a 52 y.o. male  was evaluated in triage.  Pt complains of nausea, vomiting, diarrhea, lack of appetite for around 3 days.  Reports he ate some bad [place] food and was worried about food poisoning, but continues to have nausea, lack of appetite and thought that it would be resolved at this time.  Patient with PNH, received monoclonal antibody infusion, concerned about dehydration, anemia, or other abnormality today..  Review of Systems  Positive: NVD, lack of appetite Negative: Abdominal pain, fever  Physical Exam  BP 121/76 (BP Location: Left Arm)   Pulse (!) 115   Temp 97.9 F (36.6 C) (Oral)   Resp 18   SpO2 95%  Gen:   Awake, no distress   Resp:  Normal effort  MSK:   Moves extremities without difficulty  Other:  Patient tachycardic with normal rhythm in triage, he has slight scleral icterus which she reports is somewhat close to his baseline, no overt jaundice noted, no focal tenderness to palpation of the abdomen  Medical Decision Making  Medically screening exam initiated at 12:42 PM.  Appropriate orders placed.  Thomes Lolling was informed that the remainder of the evaluation will be completed by another provider, this initial triage assessment does not replace that evaluation, and the importance of remaining in the ED until their evaluation is complete.  Workup initiated in triage    Anselmo Pickler, [place] 07/12/22 1244

## 2022-07-12 NOTE — ED Triage Notes (Addendum)
52 Yo male presents c/o of nausea, vomiting and diarrhea x 3 days. Pt states he has been experiencing 2-3 episodes of loose stool every day since Friday. Pt reports vomiting subsided yesterday, but nausea is still present. Pt denies abdominal pain or fever. Otherwise no other complaints at this time

## 2022-07-12 NOTE — ED Notes (Signed)
No answer times 3 , walked through waiting room unable to find pt

## 2022-07-12 NOTE — ED Notes (Signed)
Called for pt in ED lobby for lab collection- no answer x1. [employer]

## 2022-08-06 ENCOUNTER — Inpatient Hospital Stay: Payer: BC Managed Care – PPO

## 2022-08-12 ENCOUNTER — Other Ambulatory Visit: Payer: Self-pay

## 2022-08-12 ENCOUNTER — Encounter: Payer: Self-pay | Admitting: Internal Medicine

## 2022-08-12 ENCOUNTER — Inpatient Hospital Stay: Payer: BC Managed Care – PPO | Attending: Internal Medicine

## 2022-08-12 DIAGNOSIS — D595 Paroxysmal nocturnal hemoglobinuria [Marchiafava-Micheli]: Secondary | ICD-10-CM | POA: Diagnosis present

## 2022-08-12 LAB — CMP (CANCER CENTER ONLY)
ALT: 87 U/L — ABNORMAL HIGH (ref 0–44)
AST: 71 U/L — ABNORMAL HIGH (ref 15–41)
Albumin: 4.4 g/dL (ref 3.5–5.0)
Alkaline Phosphatase: 57 U/L (ref 38–126)
Anion gap: 8 (ref 5–15)
BUN: 12 mg/dL (ref 6–20)
CO2: 27 mmol/L (ref 22–32)
Calcium: 9.5 mg/dL (ref 8.9–10.3)
Chloride: 101 mmol/L (ref 98–111)
Creatinine: 0.87 mg/dL (ref 0.61–1.24)
GFR, Estimated: >60 mL/min (ref >60)
Glucose, Bld: 111 mg/dL — ABNORMAL HIGH (ref 70–99)
Potassium: 3.7 mmol/L (ref 3.5–5.1)
Sodium: 136 mmol/L (ref 135–145)
Total Bilirubin: 3.2 mg/dL — ABNORMAL HIGH (ref 0.3–1.2)
Total Protein: 7 g/dL (ref 6.5–8.1)

## 2022-08-12 LAB — CBC WITH DIFFERENTIAL (CANCER CENTER ONLY)
Abs Immature Granulocytes: 0.02 10*3/uL (ref 0.00–0.07)
Basophils Absolute: 0 10*3/uL (ref 0.0–0.1)
Basophils Relative: 1 %
Eosinophils Absolute: 0.1 10*3/uL (ref 0.0–0.5)
Eosinophils Relative: 1 %
HCT: 32.2 % — ABNORMAL LOW (ref 39.0–52.0)
Hemoglobin: 10.8 g/dL — ABNORMAL LOW (ref 13.0–17.0)
Immature Granulocytes: 0 %
Lymphocytes Relative: 18 %
Lymphs Abs: 1.2 10*3/uL (ref 0.7–4.0)
MCH: 38.7 pg — ABNORMAL HIGH (ref 26.0–34.0)
MCHC: 33.5 g/dL (ref 30.0–36.0)
MCV: 115.4 fL — ABNORMAL HIGH (ref 80.0–100.0)
Monocytes Absolute: 0.7 10*3/uL (ref 0.1–1.0)
Monocytes Relative: 11 %
Neutro Abs: 4.4 10*3/uL (ref 1.7–7.7)
Neutrophils Relative %: 69 %
Platelet Count: 156 10*3/uL (ref 150–400)
RBC: 2.79 MIL/uL — ABNORMAL LOW (ref 4.22–5.81)
RDW: 17.4 % — ABNORMAL HIGH (ref 11.5–15.5)
WBC Count: 6.4 10*3/uL (ref 4.0–10.5)
nRBC: 0 % (ref 0.0–0.2)

## 2022-08-12 LAB — LACTATE DEHYDROGENASE: LDH: 223 U/L — ABNORMAL HIGH (ref 98–192)

## 2022-08-26 ENCOUNTER — Telehealth: Payer: Self-pay | Admitting: Medical Oncology

## 2022-08-26 NOTE — Telephone Encounter (Signed)
Pt received an out of pocket bill for $8000 for DOS 11/27/2021.[employer] copay program denied the charge because " there was a delay in filing for copay ". [employer] may be able to  put in an exception to approve it. [place] program # [id number].

## 2022-09-03 ENCOUNTER — Inpatient Hospital Stay: Payer: BC Managed Care – PPO | Attending: Internal Medicine

## 2022-09-03 ENCOUNTER — Other Ambulatory Visit: Payer: Self-pay

## 2022-09-03 ENCOUNTER — Inpatient Hospital Stay: Payer: BC Managed Care – PPO

## 2022-09-03 ENCOUNTER — Ambulatory Visit: Payer: BC Managed Care – PPO

## 2022-09-03 ENCOUNTER — Ambulatory Visit: Payer: BC Managed Care – PPO | Admitting: Internal Medicine

## 2022-09-03 ENCOUNTER — Inpatient Hospital Stay (HOSPITAL_BASED_OUTPATIENT_CLINIC_OR_DEPARTMENT_OTHER): Payer: BC Managed Care – PPO | Admitting: Internal Medicine

## 2022-09-03 ENCOUNTER — Other Ambulatory Visit: Payer: BC Managed Care – PPO

## 2022-09-03 ENCOUNTER — Encounter: Payer: Self-pay | Admitting: Medical Oncology

## 2022-09-03 VITALS — BP 120/74 | HR 94 | Temp 98.6°F | Resp 18 | Wt 175.5 lb

## 2022-09-03 DIAGNOSIS — D595 Paroxysmal nocturnal hemoglobinuria [Marchiafava-Micheli]: Secondary | ICD-10-CM

## 2022-09-03 LAB — CBC WITH DIFFERENTIAL (CANCER CENTER ONLY)
Abs Immature Granulocytes: 0.01 10*3/uL (ref 0.00–0.07)
Basophils Absolute: 0.1 10*3/uL (ref 0.0–0.1)
Basophils Relative: 1 %
Eosinophils Absolute: 0.1 10*3/uL (ref 0.0–0.5)
Eosinophils Relative: 2 %
HCT: 37.4 % — ABNORMAL LOW (ref 39.0–52.0)
Hemoglobin: 13.6 g/dL (ref 13.0–17.0)
Immature Granulocytes: 0 %
Lymphocytes Relative: 14 %
Lymphs Abs: 1 10*3/uL (ref 0.7–4.0)
MCH: 40.7 pg — ABNORMAL HIGH (ref 26.0–34.0)
MCHC: 36.4 g/dL — ABNORMAL HIGH (ref 30.0–36.0)
MCV: 112 fL — ABNORMAL HIGH (ref 80.0–100.0)
Monocytes Absolute: 0.7 10*3/uL (ref 0.1–1.0)
Monocytes Relative: 10 %
Neutro Abs: 5.2 10*3/uL (ref 1.7–7.7)
Neutrophils Relative %: 73 %
Platelet Count: 128 10*3/uL — ABNORMAL LOW (ref 150–400)
RBC: 3.34 MIL/uL — ABNORMAL LOW (ref 4.22–5.81)
RDW: 14.1 % (ref 11.5–15.5)
WBC Count: 7 10*3/uL (ref 4.0–10.5)
nRBC: 0 % (ref 0.0–0.2)

## 2022-09-03 LAB — CMP (CANCER CENTER ONLY)
ALT: 165 U/L — ABNORMAL HIGH (ref 0–44)
AST: 128 U/L — ABNORMAL HIGH (ref 15–41)
Albumin: 4.5 g/dL (ref 3.5–5.0)
Alkaline Phosphatase: 57 U/L (ref 38–126)
Anion gap: 11 (ref 5–15)
BUN: 17 mg/dL (ref 6–20)
CO2: 23 mmol/L (ref 22–32)
Calcium: 9.1 mg/dL (ref 8.9–10.3)
Chloride: 100 mmol/L (ref 98–111)
Creatinine: 1.05 mg/dL (ref 0.61–1.24)
GFR, Estimated: >60 mL/min (ref >60)
Glucose, Bld: 122 mg/dL — ABNORMAL HIGH (ref 70–99)
Potassium: 3.9 mmol/L (ref 3.5–5.1)
Sodium: 134 mmol/L — ABNORMAL LOW (ref 135–145)
Total Bilirubin: 3.5 mg/dL — ABNORMAL HIGH (ref 0.3–1.2)
Total Protein: 7.2 g/dL (ref 6.5–8.1)

## 2022-09-03 LAB — LACTATE DEHYDROGENASE: LDH: 240 U/L — ABNORMAL HIGH (ref 98–192)

## 2022-09-03 MED ORDER — SODIUM CHLORIDE 0.9 % IV SOLN
3300.0000 mg | Freq: Once | INTRAVENOUS | Status: AC
  Administered 2022-09-03: 3300 mg via INTRAVENOUS
  Filled 2022-09-03: qty 33

## 2022-09-03 MED ORDER — SODIUM CHLORIDE 0.9 % IV SOLN: Freq: Once | INTRAVENOUS | Status: AC

## 2022-09-03 NOTE — Progress Notes (Signed)
Patient seen by MD today  Vitals are within treatment parameters.  Labs reviewed: and are not within treatment parameters.Per Dr. Julien Nordmann it is ok to treat pt today with total bilirubin of 3.5, AST of 128, ALT 165.  Per physician team, patient is ready for treatment and there are NO modifications to the treatment plan.

## 2022-09-03 NOTE — Progress Notes (Signed)
Patient declined to stay for one hour post observation.

## 2022-09-03 NOTE — Progress Notes (Signed)
[facility] [phone]   [phone]  OFFICE PROGRESS NOTE  [name], MD [address] 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  PRIOR THERAPY:  Soliris (Eculizumab) initially with induction 600 MG IV weekly for 4 weeks and then switched to maintenance treatment 900 MG IV every 2 weeks. First dose of treatment was given on 11/21/2014. Status post 45 cycles.  After cycle #45 the patient received his treatment at outside infusion center.  Status post 16 more cycles of treatment.  This was discontinued secondary to worsening symptoms and lack of control of his disease.  CURRENT THERAPY: Ultomiris (Ravulizumab) loading dose 2700 mg/M2 followed by maintenance dose 3300 mg/M2 every 8 weeks.  First dose of treatment September 22, 2018.  He status post the induction dose of the treatment as well as 22 cycles of maintenance therapy.  The first 3 cycles of his treatment were given at the infusion center in [facility]  Starting from cycle #18 the patient will receive his treatment at the [facility].  INTERVAL HISTORY: Travis Mcdaniel 52 y.o. male returns to the clinic today for follow-up visit.  The patient is feeling fine today with no concerning complaints except for arthralgia.  He denied having any current chest pain, shortness of breath, cough or hemoptysis.  He has no nausea, vomiting, diarrhea or constipation.  He has no headache or visual changes.  He denied having any recent weight loss or night sweats.  He has no bleeding, bruises or ecchymosis.  He is here today for evaluation before starting cycle #23 of his treatment with Ultomiris.  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  MEDICATIONS:  Current Outpatient Medications  Medication Sig Dispense Refill   ALPRAZolam (XANAX) 1 MG tablet Take 1 mg by mouth 3 (three) times daily.  2   apixaban (ELIQUIS) 2.5 MG TABS tablet Take 1 tablet (2.5 mg total) by mouth 2 (two) times daily. 60 tablet 2   lisinopril-hydrochlorothiazide (PRINZIDE,ZESTORETIC) 20-25 MG tablet Take 1 tablet by mouth daily.     Multiple Vitamins-Minerals (MULTIVITAMIN ADULT PO) Take by mouth.     OVER THE COUNTER MEDICATION Take 1 tablet by mouth daily. OTC potassium     oxyCODONE (OXY IR/ROXICODONE) 5 MG immediate release tablet Take 1 tablet (5 mg total) by mouth every 4 (four) hours as needed for severe pain. 20 tablet 0   predniSONE (DELTASONE) 10 MG tablet Take 1 tablet (10 mg total) by mouth daily with breakfast. 14 tablet 0   Psyllium (METAMUCIL PO) Take 1 capsule by mouth daily.     tetrahydrozoline 0.05 % ophthalmic solution Place 1 drop into both eyes 2 (two) times daily as needed (eye irritation).     traMADol (ULTRAM) 50 MG tablet Take 50 mg by mouth 2 (two) times daily.     No current facility-administered medications for this visit.    SURGICAL HISTORY: No past surgical history on file.  REVIEW OF SYSTEMS:  A comprehensive review of systems was negative except for: Musculoskeletal: positive for arthralgias   PHYSICAL EXAMINATION: General appearance: alert, cooperative, fatigued, icteric, and no distress Head: Normocephalic, without obvious abnormality, atraumatic Neck: no adenopathy, no  JVD, supple, symmetrical, trachea midline, and thyroid not enlarged, symmetric, no tenderness/mass/nodules Lymph nodes: Cervical, supraclavicular, and axillary nodes normal. Resp: clear to auscultation bilaterally Back: symmetric, no curvature. ROM normal. No CVA tenderness. Cardio: regular rate and rhythm, S1, S2 normal, no murmur, click, rub or gallop GI: soft, non-tender; bowel sounds normal; no masses,   no organomegaly Extremities: extremities normal, atraumatic, no cyanosis or edema  ECOG PERFORMANCE STATUS: 1 - Symptomatic but completely ambulatory  Blood pressure 120/74, pulse 94, temperature 98.6 F (37 C), temperature source Oral, resp. rate 18, weight 175 lb 8 oz (79.6 kg), SpO2 99 %.  LABORATORY DATA: Lab Results  Component Value Date   WBC 7.0 09/03/2022   HGB 13.6 09/03/2022   HCT 37.4 (L) 09/03/2022   MCV 112.0 (H) 09/03/2022   PLT 128 (L) 09/03/2022      Chemistry      Component Value Date/Time   NA 136 08/12/2022 0839   NA 136 06/28/2017 1236   K 3.7 08/12/2022 0839   K 4.1 06/28/2017 1236   CL 101 08/12/2022 0839   CO2 27 08/12/2022 0839   CO2 24 06/28/2017 1236   BUN 12 08/12/2022 0839   BUN 17.1 06/28/2017 1236   CREATININE 0.87 08/12/2022 0839   CREATININE 1.0 06/28/2017 1236      Component Value Date/Time   CALCIUM 9.5 08/12/2022 0839   CALCIUM 9.1 06/28/2017 1236   ALKPHOS 57 08/12/2022 0839   ALKPHOS 66 06/28/2017 1236   AST 71 (H) 08/12/2022 0839   AST 40 (H) 06/28/2017 1236   ALT 87 (H) 08/12/2022 0839   ALT 60 (H) 06/28/2017 1236   BILITOT 3.2 (H) 08/12/2022 0839   BILITOT 2.87 (H) 06/28/2017 1236       RADIOGRAPHIC STUDIES: No results found.  ASSESSMENT AND PLAN:  This is a very pleasant 52 years old white male with paroxysmal nocturnal hemoglobinuria and currently on treatment with Soliris on days 1 and 15 every 4 weeks status post 45 cycles.  He is also on the same treatment received at outside infusion center status post 19 more cycles. He also completed a course of tapered dose of prednisone. He is currently on treatment with Ultomiris status post induction dose followed by 22 cycles of maintenance therapy.  He was receiving his treatment at the [facility] infusion center but because of significant lack of payment they declined to give him the infusions there anymore.  Starting from cycle #18 he received his treatment at the [facility] in [place]. The patient has been tolerating his treatment well with no concerning adverse effects. I recommended for him to proceed with cycle #23 today as planned. I will see him back for follow-up visit in 8 weeks for evaluation before cycle #24. For the deep venous thrombosis, he is currently out of Eliquis.  He will apply for patient assistance to receive the medication but for the time being he will continue on aspirin daily. He was advised to call immediately if he has any concerning symptoms in the interval.  The patient voices understanding of current disease status and treatment options and is in agreement with the current care plan. All questions were answered. The patient knows to call the clinic with any problems, questions or concerns. We can certainly see the patient much sooner if necessary.  Disclaimer: This note was dictated with voice recognition software. Similar sounding words can inadvertently be transcribed and may not be corrected upon review.

## 2022-09-04 ENCOUNTER — Ambulatory Visit: Payer: BC Managed Care – PPO | Admitting: Orthopaedic Surgery

## 2022-09-21 ENCOUNTER — Telehealth: Payer: Self-pay | Admitting: Internal Medicine

## 2022-09-21 NOTE — Telephone Encounter (Signed)
Called patient regarding May appointments, left a voicemail. ?

## 2022-09-23 ENCOUNTER — Other Ambulatory Visit: Payer: Self-pay | Admitting: Physician Assistant

## 2022-09-23 DIAGNOSIS — D595 Paroxysmal nocturnal hemoglobinuria [Marchiafava-Micheli]: Secondary | ICD-10-CM

## 2022-10-26 NOTE — Progress Notes (Unsigned)
[name], 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   PRIOR THERAPY: Soliris (Eculizumab) initially with induction 600 MG IV weekly for 4 weeks and then switched to maintenance treatment 900 MG IV every 2 weeks. First dose of treatment was given on 11/21/2014. Status post 45 cycles.  After cycle #45 the patient received his treatment at outside infusion center.  Status post 16 more cycles of treatment.  This was discontinued secondary to worsening symptoms and lack of control of his disease.   CURRENT THERAPY: Ultomiris (Ravulizumab) loading dose 2700 mg/M2 followed by maintenance dose 3300 mg/M2 every 8 weeks.  First dose of treatment September 22, 2018.  He status post the induction dose of the treatment as well as 19 cycles of maintenance therapy.  The first 3 cycles of his treatment were given at the infusion center in [facility].  Starting from cycle #18 the patient will receive his treatment at the [facility] cancer [facility].   INTERVAL HISTORY: Travis Mcdaniel 52 y.o. male returns  to the clinic today for a follow-up visit.  The patient was last seen on 09/03/22.  The patient's LFTs are elevated today.  He does have a history of alcohol abuse although he stopped drinking alcohol for 20 days.  He had a friend visiting over the weekend and drink approximately 4-5 alcoholic beverages Friday through Sunday.  He also has been struggling with arthritis in his knee and states he was told he needs a knee replacement and he has been taking approximately 1000 mg of Tylenol daily for 4 days last week.    Since last being seen, he states his energy is about the same., he could tell he had some more fatigue starting last week. He denies any fever, chills, night sweats, or weight loss.  Denies any recent nausea.  He reports some  dyspnea on exertion, such as climbing the stairs.  He denies any chest pain, cough, or hemoptysis. He denies any vomiting, diarrhea, constipation, or abdominal pain.  He denies any dark urine or jaundice.  He is compliant with his folic acid and iron supplement.  He is currently on a blood thinner for DVT prophylaxis. He reports he was never diagnosed with cirrhosis or liver disease. He is here for evaluation and repeat blood work before starting cycle #24.    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  MEDICATIONS:  Current Outpatient Medications  Medication Sig Dispense Refill   allopurinol (ZYLOPRIM) 100 MG tablet Take 100 mg by mouth daily.     ALPRAZolam (XANAX) 1 MG tablet Take 1 mg by mouth 3 (three) times daily.  2   ELIQUIS 2.5 MG TABS tablet TAKE 1 TABLET BY MOUTH TWICE A DAY 60 tablet 2   lisinopril-hydrochlorothiazide (PRINZIDE,ZESTORETIC) 20-25 MG tablet Take 1 tablet by mouth daily.     meloxicam (MOBIC) 7.5 MG tablet Take 15 mg by mouth daily.     Multiple Vitamins-Minerals (MULTIVITAMIN ADULT PO) Take by mouth.     OVER THE COUNTER MEDICATION Take 1 tablet by mouth daily. OTC potassium     oxyCODONE (OXY IR/ROXICODONE) 5 MG immediate release tablet Take 1 tablet (5  mg total) by mouth every 4 (four) hours as needed for severe pain. 20 tablet 0   predniSONE (DELTASONE) 10 MG tablet Take 1 tablet (10 mg total) by mouth daily with breakfast. 14 tablet 0   Psyllium (METAMUCIL PO) Take 1 capsule by mouth daily.     tetrahydrozoline 0.05 % ophthalmic solution Place 1 drop into both eyes 2 (two) times daily as needed (eye irritation).     traMADol (ULTRAM) 50 MG tablet Take 50 mg by mouth 2 (two) times daily.     No current facility-administered medications for this visit.    SURGICAL HISTORY: No past  surgical history on file.  REVIEW OF SYSTEMS:   Review of Systems  Constitutional: Positive for fatigue. Negative for appetite change, chills, fever and unexpected weight change.  HENT:  Negative for mouth sores, nosebleeds, sore throat and trouble swallowing.   Eyes: Positive for mild yellowing of the eyes Respiratory: Positive for dyspnea on exertion. Negative for cough, hemoptysis, and wheezing.   Cardiovascular: Negative for chest pain and leg swelling.  Gastrointestinal: Negative for abdominal pain, constipation, diarrhea, nausea and vomiting.  Genitourinary: Negative for bladder incontinence, difficulty urinating, dysuria, frequency and hematuria.   Musculoskeletal: Negative for back pain, gait problem, neck pain and neck stiffness.  Skin: Negative for itching and rash.  Neurological: Negative for dizziness, extremity weakness, gait problem, headaches, light-headedness and seizures.  Hematological: Negative for adenopathy. Does not bruise/bleed easily.  Psychiatric/Behavioral: Negative for confusion, depression and sleep disturbance. The patient is not nervous/anxious.     PHYSICAL EXAMINATION:  Blood pressure 114/66, pulse 85, temperature 98.3 F (36.8 C), resp. rate 20, weight 176 lb (79.8 kg), SpO2 100 %.  ECOG PERFORMANCE STATUS: 1  Physical Exam  Constitutional: Oriented to person, place, and time and well-developed, well-nourished, and in no distress.  HENT:  Head: Normocephalic and atraumatic.  Mouth/Throat: Oropharynx is clear and moist. No oropharyngeal exudate.  Eyes: Positive for some yellowing of the sclera. Conjunctivae are normal. Right eye exhibits no discharge. Left eye exhibits no discharge. No scleral icterus.  Neck: Normal range of motion. Neck supple.  Cardiovascular: Normal rate, regular rhythm, normal heart sounds and intact distal pulses.   Pulmonary/Chest: Effort normal and breath sounds normal. No respiratory distress. No wheezes. No rales.  Abdominal:  Soft. Bowel sounds are normal. Exhibits no distension and no mass. There is no tenderness.  Musculoskeletal: Normal range of motion. Exhibits no edema.  Lymphadenopathy:    No cervical adenopathy.  Neurological: Alert and oriented to person, place, and time. Exhibits normal muscle tone. Gait normal. Coordination normal.  Skin: Skin is warm and dry. No rash noted. Not diaphoretic. No erythema. No pallor.  Psychiatric: Mood, memory and judgment normal.  Vitals reviewed.  LABORATORY DATA: Lab Results  Component Value Date   WBC 4.7 10/29/2022   HGB 13.3 10/29/2022   HCT 36.2 (L) 10/29/2022   MCV 115.3 (H) 10/29/2022   PLT 121 (L) 10/29/2022      Chemistry      Component Value Date/Time   NA 135 10/29/2022 0819   NA 136 06/28/2017 1236   K 4.0 10/29/2022 0819   K 4.1 06/28/2017 1236   CL 102 10/29/2022 0819   CO2 25 10/29/2022 0819   CO2 24 06/28/2017 1236   BUN 13 10/29/2022 0819   BUN 17.1 06/28/2017 1236   CREATININE 0.92 10/29/2022 0819   CREATININE 1.0 06/28/2017 1236      Component Value Date/Time   CALCIUM 9.4 10/29/2022  8657   CALCIUM 9.1 06/28/2017 1236   ALKPHOS 55 10/29/2022 0819   ALKPHOS 66 06/28/2017 1236   AST 195 (HH) 10/29/2022 0819   AST 40 (H) 06/28/2017 1236   ALT 224 (H) 10/29/2022 0819   ALT 60 (H) 06/28/2017 1236   BILITOT 3.0 (H) 10/29/2022 0819   BILITOT 2.87 (H) 06/28/2017 1236       RADIOGRAPHIC STUDIES:  No results found.   ASSESSMENT/PLAN:  This is a very pleasant 52 year old Caucasian male with paroxysmal nocturnal hemoglobinuria.  He been on treatment with Solaris on days 1 and 15 every 4 weeks.  He is status post 45 cycles.  He received an additional 19 cycles an outside infusion center.  He previously completed a tapering dose of prednisone that was started on September/October 2019 for an episode of hemolytic anemia. His last prednisone taper was in November 2021.    Due to convenience and his previous treatment with Solaris  interfering with his work schedule/performance, he was interested in starting Ultomiris.  His insurance requires him to receive treatment from an outside infusion center due to the facility fee here at the cancer center.  His first dose was on May 6th, 2020. Status post induction dose followed by maintenance therapy.  He is currently receiving his infusion of the Ultomiris at the [facility]. tarting from cycle #18 he received his treatment at the [facility] health cancer [facility] in [place].    He continues to tolerate his treatment well with no concerning adverse effects   Labs were reviewed and I reviewed with Dr. Arbutus Ped. His Hbg is 13.3. Bili elevated at 3.0. His LDH is 243.  His AST and ALT are elevated at 195 and 224 respectively which is likely related to the alcohol and tylenol. Dr. Arbutus Ped recommends that he proceed with treatment as planned with his LFTs. No dose adjustments necessary.     For DVT prophylaxis, he will continue on Eliquis 2.5 mg twice daily.  I sent a refill to the pharmacy.   We will see him back for follow-up visit in 8 weeks for evaluation repeat blood work before undergoing cycle #25  Dr. Arbutus Ped will defer decision to assess for cirrhosis to his PCP. I did talk to the patient about considering talking to his PCP about cirrhosis screening given his history of alcohol abuse and mild thrombocytopenia and elevated LFTs. He has not had any imaging in a few years. Discussed to avoid alcohol and tylenol with his LFTs. He may want to talk to his orthopedic surgeon to discuss his options for his arthritis so he does not need to take as much mobic/tylenol. He is not supposed to take a lot of NSAIDs due to his PNH. The patient denies abdominal pain, nausea, vomiting, fevers, or changes in bowel habits.   The patient was advised to call immediately if she has any concerning symptoms in the interval. The patient voices understanding of current disease status and treatment  options and is in agreement with the current care plan. All questions were answered. The patient knows to call the clinic with any problems, questions or concerns. We can certainly see the patient much sooner if necessary .              No orders of the defined types were placed in this encounter.   The total time spent in the appointment was 30-39 minutes.   Vanisha Whiten L Lexxus Underhill, [name] 10/29/22

## 2022-10-28 ENCOUNTER — Other Ambulatory Visit: Payer: Self-pay | Admitting: Physician Assistant

## 2022-10-28 DIAGNOSIS — D598 Other acquired hemolytic anemias: Secondary | ICD-10-CM

## 2022-10-29 ENCOUNTER — Inpatient Hospital Stay: Payer: BC Managed Care – PPO

## 2022-10-29 ENCOUNTER — Other Ambulatory Visit: Payer: Self-pay

## 2022-10-29 ENCOUNTER — Inpatient Hospital Stay (HOSPITAL_BASED_OUTPATIENT_CLINIC_OR_DEPARTMENT_OTHER): Payer: BC Managed Care – PPO | Admitting: Physician Assistant

## 2022-10-29 ENCOUNTER — Inpatient Hospital Stay: Payer: BC Managed Care – PPO | Attending: Internal Medicine

## 2022-10-29 ENCOUNTER — Telehealth: Payer: Self-pay

## 2022-10-29 VITALS — BP 117/80 | HR 71 | Resp 18

## 2022-10-29 VITALS — BP 114/66 | HR 85 | Temp 98.3°F | Resp 20 | Wt 176.0 lb

## 2022-10-29 DIAGNOSIS — F1020 Alcohol dependence, uncomplicated: Secondary | ICD-10-CM

## 2022-10-29 DIAGNOSIS — D595 Paroxysmal nocturnal hemoglobinuria [Marchiafava-Micheli]: Secondary | ICD-10-CM

## 2022-10-29 DIAGNOSIS — R7989 Other specified abnormal findings of blood chemistry: Secondary | ICD-10-CM | POA: Diagnosis not present

## 2022-10-29 DIAGNOSIS — D598 Other acquired hemolytic anemias: Secondary | ICD-10-CM

## 2022-10-29 LAB — CBC WITH DIFFERENTIAL (CANCER CENTER ONLY)
Abs Immature Granulocytes: 0.03 10*3/uL (ref 0.00–0.07)
Basophils Absolute: 0 10*3/uL (ref 0.0–0.1)
Basophils Relative: 1 %
Eosinophils Absolute: 0.1 10*3/uL (ref 0.0–0.5)
Eosinophils Relative: 2 %
HCT: 36.2 % — ABNORMAL LOW (ref 39.0–52.0)
Hemoglobin: 13.3 g/dL (ref 13.0–17.0)
Immature Granulocytes: 1 %
Lymphocytes Relative: 19 %
Lymphs Abs: 0.9 10*3/uL (ref 0.7–4.0)
MCH: 42.4 pg — ABNORMAL HIGH (ref 26.0–34.0)
MCHC: 36.7 g/dL — ABNORMAL HIGH (ref 30.0–36.0)
MCV: 115.3 fL — ABNORMAL HIGH (ref 80.0–100.0)
Monocytes Absolute: 0.5 10*3/uL (ref 0.1–1.0)
Monocytes Relative: 11 %
Neutro Abs: 3.2 10*3/uL (ref 1.7–7.7)
Neutrophils Relative %: 66 %
Platelet Count: 121 10*3/uL — ABNORMAL LOW (ref 150–400)
RBC: 3.14 MIL/uL — ABNORMAL LOW (ref 4.22–5.81)
RDW: 13.2 % (ref 11.5–15.5)
WBC Count: 4.7 10*3/uL (ref 4.0–10.5)
nRBC: 0 % (ref 0.0–0.2)

## 2022-10-29 LAB — CMP (CANCER CENTER ONLY)
ALT: 224 U/L — ABNORMAL HIGH (ref 0–44)
AST: 195 U/L (ref 15–41)
Albumin: 4.5 g/dL (ref 3.5–5.0)
Alkaline Phosphatase: 55 U/L (ref 38–126)
Anion gap: 8 (ref 5–15)
BUN: 13 mg/dL (ref 6–20)
CO2: 25 mmol/L (ref 22–32)
Calcium: 9.4 mg/dL (ref 8.9–10.3)
Chloride: 102 mmol/L (ref 98–111)
Creatinine: 0.92 mg/dL (ref 0.61–1.24)
GFR, Estimated: >60 mL/min (ref >60)
Glucose, Bld: 144 mg/dL — ABNORMAL HIGH (ref 70–99)
Potassium: 4 mmol/L (ref 3.5–5.1)
Sodium: 135 mmol/L (ref 135–145)
Total Bilirubin: 3 mg/dL — ABNORMAL HIGH (ref 0.3–1.2)
Total Protein: 7 g/dL (ref 6.5–8.1)

## 2022-10-29 LAB — LACTATE DEHYDROGENASE: LDH: 243 U/L — ABNORMAL HIGH (ref 98–192)

## 2022-10-29 MED ORDER — SODIUM CHLORIDE 0.9 % IV SOLN
3300.0000 mg | Freq: Once | INTRAVENOUS | Status: AC
  Administered 2022-10-29: 3300 mg via INTRAVENOUS
  Filled 2022-10-29: qty 33

## 2022-10-29 MED ORDER — SODIUM CHLORIDE 0.9 % IV SOLN: Freq: Once | INTRAVENOUS | Status: AC

## 2022-10-29 NOTE — Telephone Encounter (Signed)
CRITICAL VALUE STICKER  CRITICAL VALUE:  AST 195  RECEIVER (on-site recipient of call): Wyoma Genson P. LPN   DATE & TIME NOTIFIED: 10/29/2022 916 am  MESSENGER (representative from lab): Shanda Bumps  MD NOTIFIED: C. Heilingoetter [name]  TIME OF NOTIFICATION: 920 am

## 2022-10-29 NOTE — Progress Notes (Signed)
Per Cassie PA, ok to treat today with elevated LFTs and Bilirubin.   Pt declined to stay for 1 hour observation period post infusion. VSS. No complaints at time of discharge.

## 2022-10-29 NOTE — Patient Instructions (Signed)
[facility]  Discharge Instructions: Thank you for choosing [facility] to provide your oncology and hematology care.   If you have a lab appointment with the Cancer Center, please go directly to the Cancer Center and check in at the registration area.   Wear comfortable clothing and clothing appropriate for easy access to any Portacath or PICC line.   We strive to give you quality time with your provider. You may need to reschedule your appointment if you arrive late (15 or more minutes).  Arriving late affects you and other patients whose appointments are after yours.  Also, if you miss three or more appointments without notifying the office, you may be dismissed from the clinic at the provider's discretion.      For prescription refill requests, have your pharmacy contact our office and allow 72 hours for refills to be completed.    Today you received the following chemotherapy and/or immunotherapy agents: Ultomiris.      To help prevent nausea and vomiting after your treatment, we encourage you to take your nausea medication as directed.  BELOW ARE SYMPTOMS THAT SHOULD BE REPORTED IMMEDIATELY: *FEVER GREATER THAN 100.4 F (38 C) OR HIGHER *CHILLS OR SWEATING *NAUSEA AND VOMITING THAT IS NOT CONTROLLED WITH YOUR NAUSEA MEDICATION *UNUSUAL SHORTNESS OF BREATH *UNUSUAL BRUISING OR BLEEDING *URINARY PROBLEMS (pain or burning when urinating, or frequent urination) *BOWEL PROBLEMS (unusual diarrhea, constipation, pain near the anus) TENDERNESS IN MOUTH AND THROAT WITH OR WITHOUT PRESENCE OF ULCERS (sore throat, sores in mouth, or a toothache) UNUSUAL RASH, SWELLING OR PAIN  UNUSUAL VAGINAL DISCHARGE OR ITCHING   Items with * indicate a potential emergency and should be followed up as soon as possible or go to the Emergency Department if any problems should occur.  Please show the CHEMOTHERAPY ALERT CARD or IMMUNOTHERAPY ALERT CARD at  check-in to the Emergency Department and triage nurse.  Should you have questions after your visit or need to cancel or reschedule your appointment, please contact [facility]  Dept: [phone]  and follow the prompts.  Office hours are 8:00 a.m. to 4:30 p.m. Monday - Friday. Please note that voicemails left after 4:00 p.m. may not be returned until the following business day.  We are closed weekends and major holidays. You have access to a nurse at all times for urgent questions. Please call the main number to the clinic Dept: [phone] and follow the prompts.   For any non-urgent questions, you may also contact your provider using MyChart. We now offer e-Visits for anyone 33 and older to request care online for non-urgent symptoms. For details visit [email or web address].   Also download the MyChart app! Go to the app store, search "MyChart", open the app, select [facility], and log in with your MyChart username and password.

## 2022-10-30 ENCOUNTER — Telehealth: Payer: Self-pay | Admitting: Hematology

## 2022-10-30 NOTE — Telephone Encounter (Signed)
Reached out to patient this AM to change his appointments to early morning per IB message, was told to change it back and not put it at 7:30 as requested per Cassie.

## 2022-11-04 ENCOUNTER — Telehealth: Payer: Self-pay | Admitting: Internal Medicine

## 2022-11-04 NOTE — Telephone Encounter (Signed)
Called patient regarding June appointments, left a voicemail. 

## 2022-12-04 ENCOUNTER — Other Ambulatory Visit: Payer: Self-pay | Admitting: Internal Medicine

## 2022-12-04 DIAGNOSIS — D595 Paroxysmal nocturnal hemoglobinuria [Marchiafava-Micheli]: Secondary | ICD-10-CM

## 2022-12-21 ENCOUNTER — Other Ambulatory Visit: Payer: Self-pay | Admitting: *Deleted

## 2022-12-21 DIAGNOSIS — D595 Paroxysmal nocturnal hemoglobinuria [Marchiafava-Micheli]: Secondary | ICD-10-CM

## 2022-12-23 ENCOUNTER — Inpatient Hospital Stay: Payer: BC Managed Care – PPO

## 2022-12-23 ENCOUNTER — Inpatient Hospital Stay (HOSPITAL_BASED_OUTPATIENT_CLINIC_OR_DEPARTMENT_OTHER): Payer: BC Managed Care – PPO | Admitting: Internal Medicine

## 2022-12-23 ENCOUNTER — Other Ambulatory Visit: Payer: Self-pay

## 2022-12-23 ENCOUNTER — Inpatient Hospital Stay: Payer: BC Managed Care – PPO | Attending: Internal Medicine

## 2022-12-23 ENCOUNTER — Other Ambulatory Visit: Payer: BC Managed Care – PPO

## 2022-12-23 ENCOUNTER — Ambulatory Visit: Payer: BC Managed Care – PPO

## 2022-12-23 ENCOUNTER — Ambulatory Visit: Payer: BC Managed Care – PPO | Admitting: Internal Medicine

## 2022-12-23 VITALS — BP 115/71 | HR 84 | Temp 98.0°F | Resp 18 | Wt 177.1 lb

## 2022-12-23 VITALS — BP 118/69 | HR 74 | Resp 17

## 2022-12-23 DIAGNOSIS — D595 Paroxysmal nocturnal hemoglobinuria [Marchiafava-Micheli]: Secondary | ICD-10-CM | POA: Insufficient documentation

## 2022-12-23 DIAGNOSIS — Z7901 Long term (current) use of anticoagulants: Secondary | ICD-10-CM | POA: Diagnosis not present

## 2022-12-23 DIAGNOSIS — Z86718 Personal history of other venous thrombosis and embolism: Secondary | ICD-10-CM | POA: Insufficient documentation

## 2022-12-23 DIAGNOSIS — M25572 Pain in left ankle and joints of left foot: Secondary | ICD-10-CM | POA: Insufficient documentation

## 2022-12-23 DIAGNOSIS — Z79620 Long term (current) use of immunosuppressive biologic: Secondary | ICD-10-CM | POA: Insufficient documentation

## 2022-12-23 DIAGNOSIS — Z7982 Long term (current) use of aspirin: Secondary | ICD-10-CM | POA: Diagnosis not present

## 2022-12-23 LAB — CMP (CANCER CENTER ONLY)
ALT: 99 U/L — ABNORMAL HIGH (ref 0–44)
AST: 83 U/L — ABNORMAL HIGH (ref 15–41)
Albumin: 4.1 g/dL (ref 3.5–5.0)
Alkaline Phosphatase: 57 U/L (ref 38–126)
Anion gap: 9 (ref 5–15)
BUN: 10 mg/dL (ref 6–20)
CO2: 25 mmol/L (ref 22–32)
Calcium: 9.1 mg/dL (ref 8.9–10.3)
Chloride: 101 mmol/L (ref 98–111)
Creatinine: 0.84 mg/dL (ref 0.61–1.24)
GFR, Estimated: >60 mL/min (ref >60)
Glucose, Bld: 112 mg/dL — ABNORMAL HIGH (ref 70–99)
Potassium: 3.7 mmol/L (ref 3.5–5.1)
Sodium: 135 mmol/L (ref 135–145)
Total Bilirubin: 2.8 mg/dL — ABNORMAL HIGH (ref 0.3–1.2)
Total Protein: 7.2 g/dL (ref 6.5–8.1)

## 2022-12-23 LAB — CBC WITH DIFFERENTIAL (CANCER CENTER ONLY)
Abs Immature Granulocytes: 0.02 10*3/uL (ref 0.00–0.07)
Basophils Absolute: 0 10*3/uL (ref 0.0–0.1)
Basophils Relative: 0 %
Eosinophils Absolute: 0.1 10*3/uL (ref 0.0–0.5)
Eosinophils Relative: 2 %
HCT: 34.3 % — ABNORMAL LOW (ref 39.0–52.0)
Hemoglobin: 11.7 g/dL — ABNORMAL LOW (ref 13.0–17.0)
Immature Granulocytes: 0 %
Lymphocytes Relative: 18 %
Lymphs Abs: 0.8 10*3/uL (ref 0.7–4.0)
MCH: 39.8 pg — ABNORMAL HIGH (ref 26.0–34.0)
MCHC: 34.1 g/dL (ref 30.0–36.0)
MCV: 116.7 fL — ABNORMAL HIGH (ref 80.0–100.0)
Monocytes Absolute: 0.4 10*3/uL (ref 0.1–1.0)
Monocytes Relative: 8 %
Neutro Abs: 3.3 10*3/uL (ref 1.7–7.7)
Neutrophils Relative %: 72 %
Platelet Count: 116 10*3/uL — ABNORMAL LOW (ref 150–400)
RBC: 2.94 MIL/uL — ABNORMAL LOW (ref 4.22–5.81)
RDW: 13.7 % (ref 11.5–15.5)
WBC Count: 4.6 10*3/uL (ref 4.0–10.5)
nRBC: 0 % (ref 0.0–0.2)

## 2022-12-23 LAB — LACTATE DEHYDROGENASE: LDH: 216 U/L — ABNORMAL HIGH (ref 98–192)

## 2022-12-23 MED ORDER — SODIUM CHLORIDE 0.9 % IV SOLN
3300.0000 mg | Freq: Once | INTRAVENOUS | Status: AC
  Administered 2022-12-23: 3300 mg via INTRAVENOUS
  Filled 2022-12-23: qty 33

## 2022-12-23 MED ORDER — SODIUM CHLORIDE 0.9 % IV SOLN: Freq: Once | INTRAVENOUS | Status: AC

## 2022-12-23 MED ORDER — PREDNISONE 10 MG PO TABS: 10.0000 mg | ORAL_TABLET | Freq: Every day | ORAL | 0 refills | Status: DC

## 2022-12-23 NOTE — Progress Notes (Signed)
[facility] Telephone:([phone]   Fax:([phone]  OFFICE PROGRESS NOTE  [name], MD [address] 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  PRIOR THERAPY:  Soliris (Eculizumab) initially with induction 600 MG IV weekly for 4 weeks and then switched to maintenance treatment 900 MG IV every 2 weeks. First dose of treatment was given on 11/21/2014. Status post 45 cycles.  After cycle #45 the patient received his treatment at outside infusion center.  Status post 16 more cycles of treatment.  This was discontinued secondary to worsening symptoms and lack of control of his disease.  CURRENT THERAPY: Ultomiris (Ravulizumab) loading dose 2700 mg/M2 followed by maintenance dose 3300 mg/M2 every 8 weeks.  First dose of treatment September 22, 2018.  He status post the induction dose of the treatment as well as 24 cycles of maintenance therapy.  The first 3 cycles of his treatment were given at the infusion center in [facility].  Starting from cycle #18 the patient will receive his treatment at the [facility] health cancer Center.  INTERVAL HISTORY: Travis Mcdaniel 52 y.o. male returns to the clinic today for follow-up visit.  The patient is feeling fine today with no concerning complaints except for left ankle pain.  He was evaluated by orthopedic surgery and they requested MRI but he could not afford the copayment of $950.  He denied having any current chest pain, shortness of breath, cough or hemoptysis.  He has no nausea, vomiting, diarrhea or constipation.  He has no headache or visual changes.  He is trying to lose weight and according to him he quit drinking alcohol and taking ibuprofen for few weeks now.  He is here for evaluation before starting cycle #25 of Ultomiris.  MEDICAL HISTORY: Past Medical History:  Diagnosis Date   Cancer (HCC)    PNH (Bone  marrow)   Diverticulosis    Encounter for antineoplastic chemotherapy 01/15/2016   Hypertension     ALLERGIES:  has No Known Allergies.  MEDICATIONS:  Current Outpatient Medications  Medication Sig Dispense Refill   allopurinol (ZYLOPRIM) 100 MG tablet Take 100 mg by mouth daily.     ALPRAZolam (XANAX) 1 MG tablet Take 1 mg by mouth 3 (three) times daily.  2   ELIQUIS 2.5 MG TABS tablet TAKE 1 TABLET BY MOUTH TWICE A DAY 60 tablet 2   lisinopril-hydrochlorothiazide (PRINZIDE,ZESTORETIC) 20-25 MG tablet Take 1 tablet by mouth daily.     meloxicam (MOBIC) 7.5 MG tablet Take 15 mg by mouth daily.     Multiple Vitamins-Minerals (MULTIVITAMIN ADULT PO) Take by mouth.     OVER THE COUNTER MEDICATION Take 1 tablet by mouth daily. OTC potassium     oxyCODONE (OXY IR/ROXICODONE) 5 MG immediate release tablet Take 1 tablet (5 mg total) by mouth every 4 (four) hours as needed for severe pain. 20 tablet 0   predniSONE (DELTASONE) 10 MG tablet Take 1 tablet (10 mg total) by mouth daily with breakfast. 14 tablet 0   Psyllium (METAMUCIL PO) Take 1 capsule by mouth daily.     tetrahydrozoline 0.05 % ophthalmic solution Place 1 drop into both eyes 2 (two) times daily as needed (eye irritation).     traMADol (ULTRAM) 50 MG tablet Take 50 mg by mouth 2 (two) times daily.     No current facility-administered medications for this visit.    SURGICAL HISTORY: No  past surgical history on file.  REVIEW OF SYSTEMS:  A comprehensive review of systems was negative except for: Constitutional: positive for fatigue Musculoskeletal: positive for arthralgias   PHYSICAL EXAMINATION: General appearance: alert, cooperative, fatigued, icteric, and no distress Head: Normocephalic, without obvious abnormality, atraumatic Neck: no adenopathy, no JVD, supple, symmetrical, trachea midline, and thyroid not enlarged, symmetric, no tenderness/mass/nodules Lymph nodes: Cervical, supraclavicular, and axillary nodes  normal. Resp: clear to auscultation bilaterally Back: symmetric, no curvature. ROM normal. No CVA tenderness. Cardio: regular rate and rhythm, S1, S2 normal, no murmur, click, rub or gallop GI: soft, non-tender; bowel sounds normal; no masses,  no organomegaly Extremities: extremities normal, atraumatic, no cyanosis or edema  ECOG PERFORMANCE STATUS: 1 - Symptomatic but completely ambulatory  Blood pressure 115/71, pulse 84, temperature 98 F (36.7 C), temperature source Oral, resp. rate 18, weight 177 lb 1.6 oz (80.3 kg), SpO2 100 %.  LABORATORY DATA: Lab Results  Component Value Date   WBC 4.6 12/23/2022   HGB 11.7 (L) 12/23/2022   HCT 34.3 (L) 12/23/2022   MCV 116.7 (H) 12/23/2022   PLT 116 (L) 12/23/2022      Chemistry      Component Value Date/Time   NA 135 10/29/2022 0819   NA 136 06/28/2017 1236   K 4.0 10/29/2022 0819   K 4.1 06/28/2017 1236   CL 102 10/29/2022 0819   CO2 25 10/29/2022 0819   CO2 24 06/28/2017 1236   BUN 13 10/29/2022 0819   BUN 17.1 06/28/2017 1236   CREATININE 0.92 10/29/2022 0819   CREATININE 1.0 06/28/2017 1236      Component Value Date/Time   CALCIUM 9.4 10/29/2022 0819   CALCIUM 9.1 06/28/2017 1236   ALKPHOS 55 10/29/2022 0819   ALKPHOS 66 06/28/2017 1236   AST 195 (HH) 10/29/2022 0819   AST 40 (H) 06/28/2017 1236   ALT 224 (H) 10/29/2022 0819   ALT 60 (H) 06/28/2017 1236   BILITOT 3.0 (H) 10/29/2022 0819   BILITOT 2.87 (H) 06/28/2017 1236       RADIOGRAPHIC STUDIES: No results found.  ASSESSMENT AND PLAN:  This is a very pleasant 52 years old white male with paroxysmal nocturnal hemoglobinuria and currently on treatment with Soliris on days 1 and 15 every 4 weeks status post 45 cycles.  He is also on the same treatment received at outside infusion center status post 19 more cycles. He also completed a course of tapered dose of prednisone. He is currently on treatment with Ultomiris status post induction dose followed by 24  cycles of maintenance therapy.  He was receiving his treatment at the [facility] infusion center but because of significant lack of payment they declined to give him the infusions there anymore.  Starting from cycle #18 he received his treatment at the [facility] health cancer Center in [place]. The patient has been tolerating his treatment was Ultomiris fairly well. Repeat CBC today showed mild anemia with hemoglobin of 11.7 and hematocrit 34.3 with low platelets count of 116,000. Comprehensive metabolic panel and LDH are still pending. I recommended for the patient to continue his treatment with Ultomiris today as planned. I will also start the patient on prednisone 10 mg p.o. daily for the next 2 weeks to help with his mild hemolysis as well as the arthralgia in his left ankle. For the deep venous thrombosis, he is currently out of Eliquis.  He will apply for patient assistance to receive the medication but for the time being he will continue  on aspirin daily. The patient will come back for follow-up visit in 8 weeks for evaluation before the next cycle of his treatment. He was advised to call immediately if he has any other concerning symptoms in the interval.  The patient voices understanding of current disease status and treatment options and is in agreement with the current care plan. All questions were answered. The patient knows to call the clinic with any problems, questions or concerns. We can certainly see the patient much sooner if necessary.  Disclaimer: This note was dictated with voice recognition software. Similar sounding words can inadvertently be transcribed and may not be corrected upon review.

## 2022-12-23 NOTE — Patient Instructions (Signed)
[facility]  Discharge Instructions: Thank you for choosing [facility] to provide your oncology and hematology care.   If you have a lab appointment with the Cancer Center, please go directly to the Cancer Center and check in at the registration area.   Wear comfortable clothing and clothing appropriate for easy access to any Portacath or PICC line.   We strive to give you quality time with your provider. You may need to reschedule your appointment if you arrive late (15 or more minutes).  Arriving late affects you and other patients whose appointments are after yours.  Also, if you miss three or more appointments without notifying the office, you may be dismissed from the clinic at the provider's discretion.      For prescription refill requests, have your pharmacy contact our office and allow 72 hours for refills to be completed.    Today you received the following chemotherapy and/or immunotherapy agents: Ultomiris.      To help prevent nausea and vomiting after your treatment, we encourage you to take your nausea medication as directed.  BELOW ARE SYMPTOMS THAT SHOULD BE REPORTED IMMEDIATELY: *FEVER GREATER THAN 100.4 F (38 C) OR HIGHER *CHILLS OR SWEATING *NAUSEA AND VOMITING THAT IS NOT CONTROLLED WITH YOUR NAUSEA MEDICATION *UNUSUAL SHORTNESS OF BREATH *UNUSUAL BRUISING OR BLEEDING *URINARY PROBLEMS (pain or burning when urinating, or frequent urination) *BOWEL PROBLEMS (unusual diarrhea, constipation, pain near the anus) TENDERNESS IN MOUTH AND THROAT WITH OR WITHOUT PRESENCE OF ULCERS (sore throat, sores in mouth, or a toothache) UNUSUAL RASH, SWELLING OR PAIN  UNUSUAL VAGINAL DISCHARGE OR ITCHING   Items with * indicate a potential emergency and should be followed up as soon as possible or go to the Emergency Department if any problems should occur.  Please show the CHEMOTHERAPY ALERT CARD or IMMUNOTHERAPY ALERT CARD at  check-in to the Emergency Department and triage nurse.  Should you have questions after your visit or need to cancel or reschedule your appointment, please contact [facility]  Dept: [phone]  and follow the prompts.  Office hours are 8:00 a.m. to 4:30 p.m. Monday - Friday. Please note that voicemails left after 4:00 p.m. may not be returned until the following business day.  We are closed weekends and major holidays. You have access to a nurse at all times for urgent questions. Please call the main number to the clinic Dept: [phone] and follow the prompts.   For any non-urgent questions, you may also contact your provider using MyChart. We now offer e-Visits for anyone 18 and older to request care online for non-urgent symptoms. For details visit [email or web address].   Also download the MyChart app! Go to the app store, search "MyChart", open the app, select [place], and log in with your MyChart username and password.   

## 2022-12-23 NOTE — Addendum Note (Signed)
Addended by: Charma Igo on: 12/23/2022 08:43 AM   Modules accepted: Orders

## 2022-12-23 NOTE — Progress Notes (Signed)
Pt declined to stay for post observation, discharged with VSS, ambulatory to lobby

## 2022-12-24 ENCOUNTER — Ambulatory Visit: Payer: BC Managed Care – PPO

## 2022-12-24 ENCOUNTER — Other Ambulatory Visit: Payer: BC Managed Care – PPO

## 2022-12-24 ENCOUNTER — Ambulatory Visit: Payer: BC Managed Care – PPO | Admitting: Internal Medicine

## 2022-12-27 ENCOUNTER — Other Ambulatory Visit: Payer: Self-pay | Admitting: Physician Assistant

## 2022-12-27 DIAGNOSIS — D595 Paroxysmal nocturnal hemoglobinuria [Marchiafava-Micheli]: Secondary | ICD-10-CM

## 2023-01-12 ENCOUNTER — Emergency Department (HOSPITAL_COMMUNITY): Payer: BC Managed Care – PPO

## 2023-01-12 ENCOUNTER — Telehealth: Payer: Self-pay

## 2023-01-12 ENCOUNTER — Encounter (HOSPITAL_COMMUNITY): Payer: Self-pay

## 2023-01-12 ENCOUNTER — Telehealth: Payer: Self-pay | Admitting: *Deleted

## 2023-01-12 ENCOUNTER — Observation Stay (HOSPITAL_COMMUNITY)
Admission: EM | Admit: 2023-01-12 | Discharge: 2023-01-13 | Disposition: A | Discharge status: Home / Self Care | Payer: BC Managed Care – PPO | Attending: Family Medicine | Admitting: Family Medicine

## 2023-01-12 ENCOUNTER — Other Ambulatory Visit: Payer: Self-pay

## 2023-01-12 ENCOUNTER — Inpatient Hospital Stay: Payer: BC Managed Care – PPO | Admitting: Physician Assistant

## 2023-01-12 ENCOUNTER — Inpatient Hospital Stay: Payer: BC Managed Care – PPO | Attending: Internal Medicine

## 2023-01-12 VITALS — BP 144/84 | HR 100 | Temp 98.4°F | Resp 18 | Wt 176.3 lb

## 2023-01-12 DIAGNOSIS — F419 Anxiety disorder, unspecified: Secondary | ICD-10-CM | POA: Diagnosis not present

## 2023-01-12 DIAGNOSIS — R7989 Other specified abnormal findings of blood chemistry: Secondary | ICD-10-CM

## 2023-01-12 DIAGNOSIS — R17 Unspecified jaundice: Secondary | ICD-10-CM | POA: Diagnosis present

## 2023-01-12 DIAGNOSIS — I10 Essential (primary) hypertension: Secondary | ICD-10-CM | POA: Diagnosis not present

## 2023-01-12 DIAGNOSIS — R109 Unspecified abdominal pain: Secondary | ICD-10-CM | POA: Diagnosis not present

## 2023-01-12 DIAGNOSIS — Z79899 Other long term (current) drug therapy: Secondary | ICD-10-CM | POA: Insufficient documentation

## 2023-01-12 DIAGNOSIS — D696 Thrombocytopenia, unspecified: Secondary | ICD-10-CM | POA: Diagnosis not present

## 2023-01-12 DIAGNOSIS — D595 Paroxysmal nocturnal hemoglobinuria [Marchiafava-Micheli]: Secondary | ICD-10-CM

## 2023-01-12 DIAGNOSIS — Z85830 Personal history of malignant neoplasm of bone: Secondary | ICD-10-CM | POA: Insufficient documentation

## 2023-01-12 DIAGNOSIS — Z7901 Long term (current) use of anticoagulants: Secondary | ICD-10-CM | POA: Diagnosis not present

## 2023-01-12 LAB — BILIRUBIN, FRACTIONATED(TOT/DIR/INDIR)
Bilirubin, Direct: 12.8 mg/dL — ABNORMAL HIGH (ref 0.0–0.2)
Indirect Bilirubin: 8.7 mg/dL — ABNORMAL HIGH (ref 0.3–0.9)
Total Bilirubin: 21.5 mg/dL (ref 0.3–1.2)

## 2023-01-12 LAB — CBC WITH DIFFERENTIAL (CANCER CENTER ONLY)
Abs Immature Granulocytes: 0.03 10*3/uL (ref 0.00–0.07)
Basophils Absolute: 0.1 10*3/uL (ref 0.0–0.1)
Basophils Relative: 1 %
Eosinophils Absolute: 0 10*3/uL (ref 0.0–0.5)
Eosinophils Relative: 0 %
HCT: 41 % (ref 39.0–52.0)
Hemoglobin: 15.1 g/dL (ref 13.0–17.0)
Immature Granulocytes: 0 %
Lymphocytes Relative: 7 %
Lymphs Abs: 0.6 10*3/uL — ABNORMAL LOW (ref 0.7–4.0)
MCH: 39.7 pg — ABNORMAL HIGH (ref 26.0–34.0)
MCHC: 36.8 g/dL — ABNORMAL HIGH (ref 30.0–36.0)
MCV: 107.9 fL — ABNORMAL HIGH (ref 80.0–100.0)
Monocytes Absolute: 0.8 10*3/uL (ref 0.1–1.0)
Monocytes Relative: 9 %
Neutro Abs: 6.9 10*3/uL (ref 1.7–7.7)
Neutrophils Relative %: 83 %
Platelet Count: 94 10*3/uL — ABNORMAL LOW (ref 150–400)
RBC: 3.8 MIL/uL — ABNORMAL LOW (ref 4.22–5.81)
RDW: 13 % (ref 11.5–15.5)
WBC Count: 8.4 10*3/uL (ref 4.0–10.5)
nRBC: 0 % (ref 0.0–0.2)

## 2023-01-12 LAB — RETICULOCYTES
Immature Retic Fract: 14.8 % (ref 2.3–15.9)
RBC.: 3.69 MIL/uL — ABNORMAL LOW (ref 4.22–5.81)
Retic Count, Absolute: 206.3 10*3/uL — ABNORMAL HIGH (ref 19.0–186.0)
Retic Ct Pct: 5.6 % — ABNORMAL HIGH (ref 0.4–3.1)

## 2023-01-12 LAB — CMP (CANCER CENTER ONLY)
ALT: 163 U/L — ABNORMAL HIGH (ref 0–44)
AST: 169 U/L — ABNORMAL HIGH (ref 15–41)
Albumin: 4.4 g/dL (ref 3.5–5.0)
Alkaline Phosphatase: 89 U/L (ref 38–126)
Anion gap: 12 (ref 5–15)
BUN: 8 mg/dL (ref 6–20)
CO2: 23 mmol/L (ref 22–32)
Calcium: 9.5 mg/dL (ref 8.9–10.3)
Chloride: 98 mmol/L (ref 98–111)
Creatinine: 1.19 mg/dL (ref 0.61–1.24)
GFR, Estimated: >60 mL/min (ref >60)
Glucose, Bld: 110 mg/dL — ABNORMAL HIGH (ref 70–99)
Potassium: 3.5 mmol/L (ref 3.5–5.1)
Sodium: 133 mmol/L — ABNORMAL LOW (ref 135–145)
Total Bilirubin: 21.6 mg/dL (ref 0.3–1.2)
Total Protein: 6.9 g/dL (ref 6.5–8.1)

## 2023-01-12 LAB — CBC
HCT: 41.3 % (ref 39.0–52.0)
Hemoglobin: 14.6 g/dL (ref 13.0–17.0)
MCH: 39.4 pg — ABNORMAL HIGH (ref 26.0–34.0)
MCHC: 35.4 g/dL (ref 30.0–36.0)
MCV: 111.3 fL — ABNORMAL HIGH (ref 80.0–100.0)
Platelets: 83 10*3/uL — ABNORMAL LOW (ref 150–400)
RBC: 3.71 MIL/uL — ABNORMAL LOW (ref 4.22–5.81)
RDW: 13.2 % (ref 11.5–15.5)
WBC: 6.8 10*3/uL (ref 4.0–10.5)
nRBC: 0 % (ref 0.0–0.2)

## 2023-01-12 LAB — PROTIME-INR
INR: 1.1 (ref 0.8–1.2)
Prothrombin Time: 14.1 seconds (ref 11.4–15.2)

## 2023-01-12 LAB — IRON AND TIBC
Iron: 94 ug/dL (ref 45–182)
Saturation Ratios: 35 % (ref 17.9–39.5)
TIBC: 266 ug/dL (ref 250–450)
UIBC: 172 ug/dL

## 2023-01-12 LAB — SAMPLE TO BLOOD BANK

## 2023-01-12 LAB — BILIRUBIN, DIRECT: Bilirubin, Direct: 14.9 mg/dL — ABNORMAL HIGH (ref 0.0–0.2)

## 2023-01-12 LAB — FERRITIN: Ferritin: 4479 ng/mL — ABNORMAL HIGH (ref 24–336)

## 2023-01-12 LAB — LACTATE DEHYDROGENASE: LDH: 269 U/L — ABNORMAL HIGH (ref 98–192)

## 2023-01-12 LAB — LIPASE, BLOOD: Lipase: 121 U/L — ABNORMAL HIGH (ref 11–51)

## 2023-01-12 LAB — ETHANOL: Alcohol, Ethyl (B): <10 mg/dL (ref <10)

## 2023-01-12 MED ORDER — FERROUS SULFATE 325 (65 FE) MG PO TABS
325.0000 mg | ORAL_TABLET | Freq: Every day | ORAL | Status: DC
  Administered 2023-01-13: 325 mg via ORAL
  Filled 2023-01-12: qty 1

## 2023-01-12 MED ORDER — PSYLLIUM 95 % PO PACK
1.0000 | PACK | Freq: Every day | ORAL | Status: DC
  Administered 2023-01-13: 1 via ORAL
  Filled 2023-01-12: qty 1

## 2023-01-12 MED ORDER — ONDANSETRON HCL 4 MG PO TABS: 4.0000 mg | ORAL_TABLET | Freq: Four times a day (QID) | ORAL | Status: DC | PRN

## 2023-01-12 MED ORDER — HYDROCHLOROTHIAZIDE 25 MG PO TABS
25.0000 mg | ORAL_TABLET | Freq: Every day | ORAL | Status: DC
  Administered 2023-01-13: 25 mg via ORAL
  Filled 2023-01-12: qty 1

## 2023-01-12 MED ORDER — APIXABAN 2.5 MG PO TABS
2.5000 mg | ORAL_TABLET | Freq: Two times a day (BID) | ORAL | Status: DC
  Administered 2023-01-13: 2.5 mg via ORAL
  Filled 2023-01-12: qty 1

## 2023-01-12 MED ORDER — IOHEXOL 300 MG/ML  SOLN
100.0000 mL | Freq: Once | INTRAMUSCULAR | Status: AC | PRN
  Administered 2023-01-12: 100 mL via INTRAVENOUS

## 2023-01-12 MED ORDER — PREDNISONE 20 MG PO TABS
80.0000 mg | ORAL_TABLET | Freq: Every day | ORAL | Status: DC
  Administered 2023-01-12 – 2023-01-13 (×2): 80 mg via ORAL
  Filled 2023-01-12 (×2): qty 4

## 2023-01-12 MED ORDER — OXYCODONE HCL 5 MG PO TABS
5.0000 mg | ORAL_TABLET | ORAL | Status: DC | PRN
  Administered 2023-01-12: 5 mg via ORAL
  Filled 2023-01-12: qty 1

## 2023-01-12 MED ORDER — ALPRAZOLAM 0.5 MG PO TABS
1.0000 mg | ORAL_TABLET | Freq: Three times a day (TID) | ORAL | Status: DC | PRN
  Administered 2023-01-13 (×2): 1 mg via ORAL
  Filled 2023-01-12 (×2): qty 2

## 2023-01-12 MED ORDER — SODIUM CHLORIDE 0.9 % IV SOLN: 250.0000 mL | INTRAVENOUS | Status: DC | PRN

## 2023-01-12 MED ORDER — ONDANSETRON HCL 4 MG/2ML IJ SOLN: 4.0000 mg | Freq: Four times a day (QID) | INTRAMUSCULAR | Status: DC | PRN

## 2023-01-12 MED ORDER — OXYCODONE-ACETAMINOPHEN 5-325 MG PO TABS
1.0000 | ORAL_TABLET | Freq: Once | ORAL | Status: DC
  Filled 2023-01-12: qty 1

## 2023-01-12 MED ORDER — LISINOPRIL-HYDROCHLOROTHIAZIDE 20-25 MG PO TABS: 1.0000 | ORAL_TABLET | Freq: Every day | ORAL | Status: DC

## 2023-01-12 MED ORDER — LISINOPRIL 20 MG PO TABS
20.0000 mg | ORAL_TABLET | Freq: Every day | ORAL | Status: DC
  Administered 2023-01-13: 20 mg via ORAL
  Filled 2023-01-12: qty 1

## 2023-01-12 MED ORDER — FOLIC ACID 1 MG PO TABS
1.0000 mg | ORAL_TABLET | Freq: Every day | ORAL | Status: DC
  Administered 2023-01-13: 1 mg via ORAL
  Filled 2023-01-12: qty 1

## 2023-01-12 MED ORDER — NAPHAZOLINE-GLYCERIN 0.012-0.25 % OP SOLN: 1.0000 [drp] | Freq: Four times a day (QID) | OPHTHALMIC | Status: DC | PRN

## 2023-01-12 MED ORDER — ADULT MULTIVITAMIN W/MINERALS CH
1.0000 | ORAL_TABLET | Freq: Every day | ORAL | Status: DC
  Filled 2023-01-12: qty 1

## 2023-01-12 MED ORDER — APIXABAN 2.5 MG PO TABS: 2.5000 mg | ORAL_TABLET | Freq: Two times a day (BID) | ORAL | Status: DC

## 2023-01-12 MED ORDER — SODIUM CHLORIDE 0.9% FLUSH: 3.0000 mL | INTRAVENOUS | Status: DC | PRN

## 2023-01-12 MED ORDER — SODIUM CHLORIDE 0.9% FLUSH
3.0000 mL | Freq: Two times a day (BID) | INTRAVENOUS | Status: DC
  Administered 2023-01-12 – 2023-01-13 (×2): 3 mL via INTRAVENOUS

## 2023-01-12 MED ORDER — ALPRAZOLAM 0.5 MG PO TABS
1.0000 mg | ORAL_TABLET | Freq: Once | ORAL | Status: AC
  Administered 2023-01-12: 1 mg via ORAL
  Filled 2023-01-12: qty 4

## 2023-01-12 NOTE — Assessment & Plan Note (Signed)
Continue lisinopril hydrochlorothiazide. ?

## 2023-01-12 NOTE — Progress Notes (Signed)
Symptom Management Consult Note [facility]    Patient Care Team: [name], MD as PCP - General (Internal Medicine)    Name / MRN / DOB: Travis Mcdaniel  [id number]  29-Jan-1971   Date of visit: 01/12/2023   Chief Complaint/Reason for visit: jaundice   Current Therapy: Ravulizumab.  Per last oncology note: Ultomiris (Ravulizumab) loading dose 2700 mg/M2 followed by maintenance dose 3300 mg/M2 every 8 weeks.  First dose of treatment September 22, 2018.  He status post the induction dose of the treatment as well as 24 cycles of maintenance therapy.  The first 3 cycles of his treatment were given at the infusion center in [facility].  Starting from cycle #18 the patient will receive his treatment at the [facility].    ASSESSMENT & PLAN: Patient is a 52 y.o. male  with history of Paroxysmal nocturnal hemoglobinuria followed by Dr. Arbutus Ped.  I have viewed most recent oncology note and lab work.    #Paroxysmal nocturnal hemoglobinuria  - Next appointment with oncologist is 02/17/23.  #Jaundice -Patient is afebrile, HDS. He is jaundice, TTP to epigastric and RUQ.  -CMP significant for new t. Bili elevation 21.6 (2.8 x 2 weeks ago) and elevated liver enzymes AST 169 and ALT 163 (88 and 99 x 2 weeks ago). Recent Binge drinking per patient. -CBC with 15.1.  With normal hemoglobin feel that this is less likely related to his PNH.  Concern for abdominal pathology.. -Patient is hesitant for evaluation although is agreeable.  Report given to accepting ED RN.   Shared visit with Dr. Shirline Frees.   Heme/Onc History: Oncology History   No history exists.      Interval history-: Travis Mcdaniel is a 52 y.o. male with pertinent history of paroxysmal not internal hemoglobinuria presenting to [facility] today with chief complaint of jaundice.  Presents unaccompanied to clinic.  Patient states he noticed that his eyes were yellow x 3 days ago.  He  started to have epigastric pain yesterday.  It is constant.  It radiates to his right upper quadrant.  He describes it as feeling like he ate spicy food.  This is a new pain for him.  He rates pain 5 out of 10 in severity.  He noticed yesterday that his urine was dark and he was concerned about peeing blood.  Denies any dysuria.  Patient does not take NSAIDs and occasionally takes Tylenol.  He did take 500 mg yesterday.  Patient denies any fever.  He does endorse decreased appetite. His bowel movements have been normal.  He admits to drinking alcohol over the Fourth of July holiday weekend.    ROS  All other systems are reviewed and are negative for acute change except as noted in the HPI.    No Known Allergies   Past Medical History:  Diagnosis Date   Cancer (HCC)    PNH (Bone marrow)   Diverticulosis    Encounter for antineoplastic chemotherapy 01/15/2016   Hypertension      No past surgical history on file.  Social History   Socioeconomic History   Marital status: Single    Spouse name: Not on file   Number of children: Not on file   Years of education: Not on file   Highest education level: Not on file  Occupational History   Not on file  Tobacco Use   Smoking status: Never   Smokeless tobacco: Never  Vaping Use  Vaping status: Never Used  Substance and Sexual Activity   Alcohol use: Yes    Comment: odouls/ [employer]light weekend   Drug use: No   Sexual activity: Not on file  Other Topics Concern   Not on file  Social History Narrative   Not on file   Social Determinants of Health   Financial Resource Strain: Not on file  Food Insecurity: Not on file  Transportation Needs: Not on file  Physical Activity: Not on file  Stress: Not on file  Social Connections: Not on file  Intimate Partner Violence: Not on file    Family History  Problem Relation Age of Onset   Alcohol abuse Brother    Cirrhosis Brother        deceased     Current Outpatient Medications:     allopurinol (ZYLOPRIM) 100 MG tablet, Take 100 mg by mouth daily., Disp: , Rfl:    ALPRAZolam (XANAX) 1 MG tablet, Take 1 mg by mouth 3 (three) times daily., Disp: , Rfl: 2   ELIQUIS 2.5 MG TABS tablet, TAKE 1 TABLET BY MOUTH TWICE A DAY, Disp: 60 tablet, Rfl: 2   lisinopril-hydrochlorothiazide (PRINZIDE,ZESTORETIC) 20-25 MG tablet, Take 1 tablet by mouth daily., Disp: , Rfl:    Multiple Vitamins-Minerals (MULTIVITAMIN ADULT PO), Take by mouth., Disp: , Rfl:    OVER THE COUNTER MEDICATION, Take 1 tablet by mouth daily. OTC potassium, Disp: , Rfl:    predniSONE (DELTASONE) 10 MG tablet, Take 1 tablet (10 mg total) by mouth daily with breakfast. (Patient not taking: Reported on 12/23/2022), Disp: 15 tablet, Rfl: 0   Psyllium (METAMUCIL PO), Take 1 capsule by mouth daily., Disp: , Rfl:    tetrahydrozoline 0.05 % ophthalmic solution, Place 1 drop into both eyes 2 (two) times daily as needed (eye irritation)., Disp: , Rfl:    traMADol (ULTRAM) 50 MG tablet, Take 50 mg by mouth 2 (two) times daily., Disp: , Rfl:   PHYSICAL EXAM: ECOG FS:1 - Symptomatic but completely ambulatory    Vitals:   01/12/23 1251  BP: (!) 144/84  Pulse: 100  Resp: 18  Temp: 98.4 F (36.9 C)  TempSrc: Oral  SpO2: 97%  Weight: 176 lb 4.8 oz (80 kg)   Physical Exam Vitals and nursing note reviewed.  Constitutional:      Appearance: He is not ill-appearing or toxic-appearing.  HENT:     Head: Normocephalic.  Eyes:     General: Scleral icterus present.     Conjunctiva/sclera: Conjunctivae normal.  Cardiovascular:     Rate and Rhythm: Normal rate and regular rhythm.     Pulses: Normal pulses.     Heart sounds: Normal heart sounds.  Pulmonary:     Effort: Pulmonary effort is normal.     Breath sounds: Normal breath sounds.  Abdominal:     General: There is no distension.     Tenderness: There is abdominal tenderness in the right upper quadrant and epigastric area.  Musculoskeletal:     Cervical back:  Normal range of motion.  Skin:    General: Skin is warm and dry.     Coloration: Skin is jaundiced.  Neurological:     Mental Status: He is alert.        LABORATORY DATA: I have reviewed the data as listed    Latest Ref Rng & Units 01/12/2023   11:50 AM 12/23/2022    7:23 AM 10/29/2022    8:19 AM  CBC  WBC 4.0 - 10.5 K/uL  8.4  4.6  4.7   Hemoglobin 13.0 - 17.0 g/dL 16.1  09.6  04.5   Hematocrit 39.0 - 52.0 % 41.0  34.3  36.2   Platelets 150 - 400 K/uL 94  116  121         Latest Ref Rng & Units 01/12/2023   11:50 AM 12/23/2022    7:23 AM 10/29/2022    8:19 AM  CMP  Glucose 70 - 99 mg/dL 409  811  914   BUN 6 - 20 mg/dL 8  10  13    Creatinine 0.61 - 1.24 mg/dL 7.82  9.56  2.13   Sodium 135 - 145 mmol/L 133  135  135   Potassium 3.5 - 5.1 mmol/L 3.5  3.7  4.0   Chloride 98 - 111 mmol/L 98  101  102   CO2 22 - 32 mmol/L 23  25  25    Calcium 8.9 - 10.3 mg/dL 9.5  9.1  9.4   Total Protein 6.5 - 8.1 g/dL 6.9  7.2  7.0   Total Bilirubin 0.3 - 1.2 mg/dL 08.6  2.8  3.0   Alkaline Phos 38 - 126 U/L 89  57  55   AST 15 - 41 U/L 169  83  195   ALT 0 - 44 U/L 163  99  224        RADIOGRAPHIC STUDIES (from last 24 hours if applicable) I have personally reviewed the radiological images as listed and agreed with the findings in the report. No results found.      Visit Diagnosis: 1. PNH (paroxysmal nocturnal hemoglobinuria) (HCC)   2. Elevated LFTs   3. Total bilirubin, elevated   4. Abdominal pain, unspecified abdominal location      No orders of the defined types were placed in this encounter.   All questions were answered. The patient knows to call the clinic with any problems, questions or concerns. No barriers to learning was detected.  A total of more than 30 minutes were spent on this encounter with face-to-face time and non-face-to-face time, including preparing to see the patient, ordering tests , counseling the patient and coordination of care as outlined above.     Thank you for allowing me to participate in the care of this patient.    Shanon Ace, [name] Department of Hematology/Oncology [facility] Phone: [phone]  Fax:(336) [phone]    01/12/2023 2:13 PM  ADDENDUM: Hematology/Oncology Attending: I had a face-to-face encounter with the patient today.  I reviewed his record, lab and recommended his care plan.  This is a very pleasant 52 years old white male with history of paroxysmal nocturnal hemoglobinuria (PNH.).  The patient status post treatment with Soliris initially before switching to Ultomiris every 8 weeks.  His last dose was given on 12/23/2022.  He was also given steroid for the last 2 weeks because of mild anemia. He presented to the clinic today with significant fatigue and weakness as well as jaundice started 3 days ago.  The patient has been drinking alcohol during the weekend.  When he presented to the clinic today for evaluation, repeat CBC showed normal hemoglobin of 12.1 and hematocrit 41.0 with low platelets count of 94,000 but comprehensive metabolic panel showed significant increase in his total bilirubin of 21.6 compared to 2.8 less than 2 weeks ago.  LDH is still pending.  The patient also is complaining of abdominal pain more on the right upper  quadrant. I had a lengthy discussion with the patient today about his condition.  I strongly recommend for him to go immediately to the emergency department for additional investigation of his hyperbilirubinemia which cannot be completely explained by hemolysis since his hemoglobin and hematocrit are within the normal range but LDH is still pending.  He also has increase of his liver enzymes. He will need imaging studies of his abdomen to rule out any obstructive lesion but if there is no clear etiology for his hyperbilirubinemia, we may start the patient on high-dose steroid with prednisone 1 Mg/KG daily for at least 1 week and then start  tapering his dose slowly over the next several weeks. If the patient gets admitted to the hospital, I will see him for reevaluation and any additional recommendation if needed. He will come back for follow-up visit in 2 weeks for evaluation and repeat blood work. He was advised to call if he has any other concerning issues in the interval. The total time spent in the appointment was 30 minutes. Disclaimer: This note was dictated with voice recognition software. Similar sounding words can inadvertently be transcribed and may be missed upon review. Lajuana Matte, MD

## 2023-01-12 NOTE — Telephone Encounter (Signed)
Pt states he is having pain in his left side, eyes are yellowing and he is "Peeing blood". States he crashed after stopping the steroids. Appt made for him to come in today for labs. CBC, CMP and Sample to blood bank ordered.

## 2023-01-12 NOTE — Telephone Encounter (Signed)
LM that he will be seen in [facility] after labs

## 2023-01-12 NOTE — Telephone Encounter (Signed)
CRITICAL VALUE STICKER  CRITICAL VALUE: TOTAL BILIRUBIN-21.6  RECEIVER (on-site recipient of call): Preesha Benjamin P. LPN  DATE & TIME NOTIFIED: 01/12/23 1241 pm   MESSENGER (representative from lab): Mindi Junker  MD NOTIFIED: Dr. Arbutus Ped

## 2023-01-12 NOTE — ED Notes (Signed)
ED TO INPATIENT HANDOFF REPORT  ED Nurse Name and Phone #: Linus Orn Name/Age/Gender Travis Mcdaniel 52 y.o. male Room/Bed: WA25/WA25  Code Status   Code Status: Prior  Home/SNF/Other Home Patient oriented to: self, place, time, and situation Is this baseline? 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 Yesterday noticed hematuria and yellow eyes. Today Bili 21.6.   Allergies Allergies  Allergen Reactions   Nsaids Other (See Comments)    Due to the type of Cancer the Patient has, he can't take NSAIDS. Bone Marrow Cancer    Level of Care/Admitting Diagnosis ED Disposition     ED Disposition  Admit   Condition  --   Comment  Hospital Area: [facility] [[id number]]  Level of Care: Progressive [102]  Admit to Progressive based on following criteria: MULTISYSTEM THREATS such as stable sepsis, metabolic/electrolyte imbalance with or without encephalopathy that is responding to early treatment.  May place patient in observation at [facility] or [facility] if equivalent level of care is available:: No  Covid Evaluation: Asymptomatic - no recent exposure (last 10 days) testing not required  Diagnosis: Hyperbilirubinemia [742595]  Admitting Physician: Orland Mustard [[id number]]  Attending Physician: Orland Mustard [[id number]]          B Medical/Surgery History Past Medical History:  Diagnosis Date   Cancer (HCC)    PNH (Bone marrow)   Diverticulosis    Encounter for antineoplastic chemotherapy 01/15/2016   Hypertension    History reviewed. No pertinent surgical history.   A IV Location/Drains/Wounds Patient Lines/Drains/Airways Status     Active Line/Drains/Airways     Name Placement date Placement time Site Days   Peripheral IV 01/12/23 20 G Left Antecubital 01/12/23  1352  Antecubital  less than 1             Intake/Output Last 24 hours No intake or output data in the 24 hours ending 01/12/23 1759  Labs/Imaging Results for orders placed or performed during the hospital encounter of 01/12/23 (from the past 48 hour(s))  Lactate dehydrogenase     Status: Abnormal   Collection Time: 01/12/23  2:30 PM  Result Value Ref Range   LDH 269 (H) 98 - 192 U/L    Comment: HEMOLYSIS AT THIS LEVEL MAY AFFECT RESULT Performed at [facility], [address], [place], [place] [address]   Bilirubin, direct     Status: Abnormal   Collection Time: 01/12/23  2:30 PM  Result Value Ref Range   Bilirubin, Direct 14.9 (H) 0.0 - 0.2 mg/dL    Comment: RESULT CONFIRMED BY MANUAL DILUTION Performed at [facility], [address] [place], [place] [address]   Protime-INR     Status: None   Collection Time: 01/12/23  2:30 PM  Result Value Ref Range   Prothrombin Time 14.1 11.4 - 15.2 seconds   INR 1.1 0.8 - 1.2    Comment: (NOTE) INR goal varies based on device and disease states. Performed at [facility], [address], [place], [place] [address]    CT ABDOMEN PELVIS W CONTRAST  Result Date: 01/12/2023 CLINICAL DATA:  Biliary obstruction suspected. Elevated bilirubin. New onset left abdominal and back pain. Hematuria. Bilirubin 21.6. EXAM: CT ABDOMEN AND PELVIS WITH CONTRAST TECHNIQUE: Multidetector CT imaging of the abdomen and pelvis was performed using  the standard protocol following bolus administration of intravenous contrast. RADIATION DOSE REDUCTION: This exam was performed according to the departmental dose-optimization program which includes automated exposure control, adjustment of the mA and/or kV according to patient size and/or use of iterative reconstruction technique. CONTRAST:  OMNIPAQUE IOHEXOL 300 MG/ML  SOLN COMPARISON:  09/05/2014 FINDINGS: Lower chest: Lung bases are clear. Hepatobiliary: Diffuse fatty infiltration  of the liver. A few scattered subcentimeter cysts are present, mildly enlarged since prior study. Imaging features are consistent with benign cysts and no imaging follow-up is indicated. The gallbladder and bile ducts are unremarkable. No intra or extrahepatic bile duct dilatation. Pancreas: Unremarkable. No pancreatic ductal dilatation or surrounding inflammatory changes. Spleen: Normal in size without focal abnormality. Adrenals/Urinary Tract: Adrenal glands are unremarkable. Kidneys are normal, without renal calculi, focal lesion, or hydronephrosis. Bladder is unremarkable. Stomach/Bowel: Stomach, small bowel, and colon are not abnormally distended. No wall thickening or inflammatory changes. Colonic diverticulosis without evidence of acute diverticulitis. Appendix is normal. Vascular/Lymphatic: Aortic atherosclerosis. No enlarged abdominal or pelvic lymph nodes. Reproductive: Prostate is unremarkable. Other: No abdominal wall hernia or abnormality. No abdominopelvic ascites. Musculoskeletal: Degenerative changes in the spine. No destructive bone lesions. IMPRESSION: 1. Mild diffuse fatty infiltration of the liver. 2. No intra or extrahepatic bile duct dilatation. No obstructing masses or stones demonstrated. 3. Aortic atherosclerosis. Electronically Signed   By: Burman Nieves M.D.   On: 01/12/2023 15:43    Pending Labs Unresulted Labs (From admission, onward)    None       Vitals/Pain Today's Vitals   01/12/23 1403 01/12/23 1406  BP:  (!) 175/90  Pulse:  92  Resp:  16  Temp:  97.9 F (36.6 C)  TempSrc:  Oral  SpO2:  96%  PainSc: 6      Isolation Precautions No active isolations  Medications Medications  predniSONE (DELTASONE) tablet 80 mg (has no administration in time range)  oxyCODONE (Oxy IR/ROXICODONE) immediate release tablet 5 mg (has no administration in time range)  ALPRAZolam (XANAX) tablet 1 mg (1 mg Oral Given 01/12/23 1437)  iohexol (OMNIPAQUE) 300 MG/ML solution 100  mL (100 mLs Intravenous Contrast Given 01/12/23 1447)    Mobility walks     Focused Assessments Cardiac Assessment Handoff:    No results found for: "CKTOTAL", "CKMB", "CKMBINDEX", "TROPONINI" Lab Results  Component Value Date   DDIMER 1.15 (H) 09/06/2014   Does the Patient currently have chest pain? No    R Recommendations: See Admitting Provider Note  Report given to:   Additional Notes: aaox4, ambulates

## 2023-01-12 NOTE — Assessment & Plan Note (Signed)
Continue xanax 1mg  TID PRN

## 2023-01-12 NOTE — ED Provider Notes (Signed)
Patient received in signout.  Sent from oncology service today.  Patient was admitted to the hospitalist with concern for new onset jaundice and elevated bilirubin levels.  Per my review of oncologist note from Dr Gwenyth Bouillon as PA addendum, we will initiate the patient on steroids 1 mg/kg daily for 7 days, oncology will follow along consultation in the hospital.  There is no clear obstructive pathology noted on CT imaging in the ED.  Admitted to Dr Mickey Farber hospitalist.   Terald Sleeper, MD 01/12/23 1736

## 2023-01-12 NOTE — Assessment & Plan Note (Addendum)
Recent baseline around 120 Platelets of 94K today INR wnl  Hold eliquis tonight Trend

## 2023-01-12 NOTE — H&P (Signed)
History and Physical    Patient: Travis Mcdaniel MVH:[id number] DOB: [birth year] DOA: 01/12/2023 DOS: the patient was seen and examined on 01/12/2023 PCP: [name], MD  Patient coming from:  cancer center  - lives alone. Ambulates independently    Chief Complaint: jaundice   HPI: Travis Mcdaniel is a 52 y.o. male with medical history significant of HTN, paroxysmal nocturnal hemoglobinuria followed by Dr. Arbutus Ped who was seen at cancer center today and was sent over to ED due to jaundice and elevated bilirubin. He states he noticed his eyes were getting yellow yesterday.  He also noticed his urine was orange/rusty color yesterday. When he woke up he noticed his skin turning yellow today. He had some discomfort in his epigastric area and his appetite has been poor. He had one episode of vomiting yesterday. Denies any nausea now. Denies any sick contacts.    Denies any fever/chills, vision changes/headaches, chest pain or palpitations, shortness of breath or cough, V/D, dysuria or leg swelling.    He does not smoke and drinks socially   ER Course:  afebrile, bp: 175/90, HR: 92, RR: 16, oxygen: 96%RA Pertinent labs: MCV: 107, platelets: 94, AST: 169, ALT: 163, t.bili: 21.6, LDH: 269,  Direct bili: 14.9, INR: 1.1  Ct abdo/pelvis:  Mild diffuse fatty infiltration of the liver. 2. No intra or extrahepatic bile duct dilatation. No obstructing masses or stones demonstrated. 3. Aortic atherosclerosis. In ED: given xanax and oxycodone. TRH asked to admit.    Review of Systems: As mentioned in the history of present illness. All other systems reviewed and are negative. Past Medical History:  Diagnosis Date   Cancer (HCC)    PNH (Bone marrow)   Diverticulosis    Encounter for antineoplastic chemotherapy 01/15/2016   Hypertension    History reviewed. No pertinent surgical history. Social History:  reports that he has never smoked. He has never used smokeless tobacco. He reports  current alcohol use. He reports that he does not use drugs.  Allergies  Allergen Reactions   Nsaids Other (See Comments)    Due to the type of Cancer the Patient has, he can't take NSAIDS. Bone Marrow Cancer    Family History  Problem Relation Age of Onset   Alcohol abuse Brother    Cirrhosis Brother        deceased    Prior to Admission medications   Medication Sig Start Date End Date Taking? Authorizing Provider  ALPRAZolam Prudy Feeler) 1 MG tablet Take 1 mg by mouth 3 (three) times daily. 10/09/15  Yes [provider]  ELIQUIS 2.5 MG TABS tablet TAKE 1 TABLET BY MOUTH TWICE A DAY 12/28/22  Yes Heilingoetter, Cassandra L, [name]  ferrous sulfate 325 (65 FE) MG EC tablet Take 325 mg by mouth daily.   Yes [provider]  folic acid (FOLVITE) 1 MG tablet Take 1 mg by mouth daily.   Yes [provider]  lisinopril-hydrochlorothiazide (PRINZIDE,ZESTORETIC) 20-25 MG tablet Take 1 tablet by mouth daily.   Yes [provider]  Multiple Vitamins-Minerals (MULTIVITAMIN ADULT PO) Take by mouth.   Yes [provider]  Potassium 99 MG TABS Take 1 tablet by mouth daily.   Yes [provider]  Psyllium (METAMUCIL PO) Take 1 capsule by mouth daily.   Yes [provider]  tetrahydrozoline 0.05 % ophthalmic solution Place 1 drop into both eyes 2 (two) times daily as needed (eye irritation).   Yes [provider]    Physical Exam:  Vitals:   01/12/23 1406 01/12/23 1811 01/12/23 1834 01/12/23 1847  BP: (!) 175/90 (!) 156/117 (!) 147/84 (!) 147/84  Pulse: 92 90 97 97  Resp: 16 16 13 13   Temp: 97.9 F (36.6 C) 98.8 F (37.1 C) 98.3 F (36.8 C) 98 F (36.7 C)  TempSrc: Oral Oral Oral Oral  SpO2: 96%  99%   Weight:    80 kg  Height:    5\' 9"  (1.753 m)   General:  Appears calm and comfortable and is in NAD.  Eyes:  PERRL, EOMI, normal lids, iris. Scleral icterus  ENT:  grossly normal hearing, lips & tongue, mmm; appropriate  dentition Neck:  no LAD, masses or thyromegaly; no carotid bruits Cardiovascular:  RRR, no m/r/g. No LE edema.  Respiratory:   CTA bilaterally with no wheezes/rales/rhonchi.  Normal respiratory effort. Abdomen:  soft, NT, ND, NABS Back:   normal alignment, no CVAT Skin:  no rash or induration seen on limited exam. Jaundice whole body  Musculoskeletal:  grossly normal tone BUE/BLE, good ROM, no bony abnormality Lower extremity:  No LE edema.  Limited foot exam with no ulcerations.  2+ distal pulses. Psychiatric:  grossly normal mood and affect, speech fluent and appropriate, AOx3 Neurologic:  CN 2-12 grossly intact, moves all extremities in coordinated fashion, sensation intact   Radiological Exams on Admission: Independently reviewed - see discussion in A/P where applicable  CT ABDOMEN PELVIS W CONTRAST  Result Date: 01/12/2023 CLINICAL DATA:  Biliary obstruction suspected. Elevated bilirubin. New onset left abdominal and back pain. Hematuria. Bilirubin 21.6. EXAM: CT ABDOMEN AND PELVIS WITH CONTRAST TECHNIQUE: Multidetector CT imaging of the abdomen and pelvis was performed using the standard protocol following bolus administration of intravenous contrast. RADIATION DOSE REDUCTION: This exam was performed according to the departmental dose-optimization program which includes automated exposure control, adjustment of the mA and/or kV according to patient size and/or use of iterative reconstruction technique. CONTRAST:  OMNIPAQUE IOHEXOL 300 MG/ML  SOLN COMPARISON:  09/05/2014 FINDINGS: Lower chest: Lung bases are clear. Hepatobiliary: Diffuse fatty infiltration of the liver. A few scattered subcentimeter cysts are present, mildly enlarged since prior study. Imaging features are consistent with benign cysts and no imaging follow-up is indicated. The gallbladder and bile ducts are unremarkable. No intra or extrahepatic bile duct dilatation. Pancreas: Unremarkable. No pancreatic ductal  dilatation or surrounding inflammatory changes. Spleen: Normal in size without focal abnormality. Adrenals/Urinary Tract: Adrenal glands are unremarkable. Kidneys are normal, without renal calculi, focal lesion, or hydronephrosis. Bladder is unremarkable. Stomach/Bowel: Stomach, small bowel, and colon are not abnormally distended. No wall thickening or inflammatory changes. Colonic diverticulosis without evidence of acute diverticulitis. Appendix is normal. Vascular/Lymphatic: Aortic atherosclerosis. No enlarged abdominal or pelvic lymph nodes. Reproductive: Prostate is unremarkable. Other: No abdominal wall hernia or abnormality. No abdominopelvic ascites. Musculoskeletal: Degenerative changes in the spine. No destructive bone lesions. IMPRESSION: 1. Mild diffuse fatty infiltration of the liver. 2. No intra or extrahepatic bile duct dilatation. No obstructing masses or stones demonstrated. 3. Aortic atherosclerosis. Electronically Signed   By: Burman Nieves M.D.   On: 01/12/2023 15:43      Labs on Admission: I have personally reviewed the available labs and imaging studies at the time of the admission.  Pertinent labs:   MCV: 10,  platelets: 94,  AST: 169,  ALT: 163,  t.bili: 21.6,  LDH: 269,  Direct bili: 14.9,  INR: 1.1   Assessment and Plan: Principal Problem:   Hyperbilirubinemia Active Problems:  Paroxysmal nocturnal hemoglobinuria (PNH) (HCC)   Elevated LFTs   Thrombocytopenia (HCC)   Hypertension   Anxiety    Assessment and Plan: * Hyperbilirubinemia 52 year old male presenting to ED after being seen at cancer center and found to be jaundiced with elevated bilirubin level >20 -obs to med-surg -abdominal pain resolved, was epigastric lipase pending -CT abdomen/pelvis: no acute finding specifically hepatic or bilary duct/system. No obstruction. Normal spleen  -concern related to PNH and hemolysis. Direct bili elevated. Check indirect, haptoglobin and reticu count. LDH  elevated  -hematology following, has recommended 1mg /kg prednisone and they will follow  -hgb stable, repeat tonight -hold eliquis tonight in case of hemolysis -type and screen -trend -if no improvement, consider GI consult   Paroxysmal nocturnal hemoglobinuria (PNH) (HCC) -Followed by Dr. Sofie Hartigan and [facility]  - Currently on Ultomiris (Ravulizumab) loading dose 2700 mg/M2 followed by maintenance dose 3300 mg/M2 every 8 weeks.  First dose of treatment September 22, 2018.  He status post the induction dose of the treatment as well as 24 cycles of maintenance therapy. Last received infusion on 12/23/22.  -concern ? Hemolysis. No obstruction seen on imaging  -hematology following, recommended 1mg /kg prednisone   Elevated LFTs Chronically elevated, he states worse if he has even a little bit of alcohol Baseline line 70-160 Both AST/ALT at upper limits CT abdomen with fatty liver, but no other abnormal fidings Check hepatitis panel  INR wnl  No abdominal pain  If continues to trend upward, consider GI consult   Thrombocytopenia (HCC) Recent baseline around 120 Platelets of 94K today INR wnl  Hold eliquis tonight Trend   Hypertension Continue lisinopril-hydrochlorothiazide   Anxiety Continue xanax 1mg  TID PRN      Advance Care Planning:   Code Status: Full Code   Consults: oncology: Dr. Arbutus Ped   DVT Prophylaxis: eliquis (start tomorrow)  Family Communication: none   Severity of Illness: The appropriate patient status for this patient is OBSERVATION. Observation status is judged to be reasonable and necessary in order to provide the required intensity of service to ensure the patient's safety. The patient's presenting symptoms, physical exam findings, and initial radiographic and laboratory data in the context of their medical condition is felt to place them at decreased risk for further clinical deterioration. Furthermore, it is anticipated that the patient will be medically stable  for discharge from the hospital within 2 midnights of admission.   Author: Orland Mustard, MD 01/12/2023 7:41 PM  For on call review [email or web address].

## 2023-01-12 NOTE — ED Triage Notes (Signed)
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 Yesterday noticed hematuria and yellow eyes. Today Bili 21.6.

## 2023-01-12 NOTE — Assessment & Plan Note (Signed)
52 year old male presenting to ED after being seen at cancer center and found to be jaundiced with elevated bilirubin level >20 -obs to med-surg -abdominal pain resolved, was epigastric lipase pending -CT abdomen/pelvis: no acute finding specifically hepatic or bilary duct/system. No obstruction. Normal spleen  -concern related to PNH and hemolysis. Direct bili elevated. Check indirect, haptoglobin and reticu count. LDH elevated  -hematology following, has recommended 1mg /kg prednisone and they will follow  -hgb stable, repeat tonight -hold eliquis tonight in case of hemolysis -type and screen -trend -if no improvement, consider GI consult

## 2023-01-12 NOTE — Assessment & Plan Note (Signed)
Chronically elevated, he states worse if he has even a little bit of alcohol Baseline line 70-160 Both AST/ALT at upper limits CT abdomen with fatty liver, but no other abnormal fidings Check hepatitis panel  INR wnl  No abdominal pain  If continues to trend upward, consider GI consult

## 2023-01-12 NOTE — Assessment & Plan Note (Addendum)
-  Followed by Dr. Sofie Hartigan and [facility]  - Currently on Ultomiris (Ravulizumab) loading dose 2700 mg/M2 followed by maintenance dose 3300 mg/M2 every 8 weeks.  First dose of treatment September 22, 2018.  He status post the induction dose of the treatment as well as 24 cycles of maintenance therapy. Last received infusion on 12/23/22.  -concern ? Hemolysis. No obstruction seen on imaging  -hematology following, recommended 1mg /kg prednisone

## 2023-01-12 NOTE — ED Provider Notes (Signed)
[place] EMERGENCY DEPARTMENT AT [facility] Provider Note   CSN: [id number] Arrival date & time: 01/12/23  1358     History  Chief Complaint  Patient presents with   Abdominal Pain    CAGE Travis Mcdaniel is a 52 y.o. male.  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  Patient states he woke up this morning and noted that he was jaundiced, his sclera was yellow, his legs appeared yellow and he was voiding dark urine.  He called oncology, they set up for an immediate lab and noted that his bilirubin was over 21 and he was advised to come to the emergency room.  He has not had hyperbilirubinemia before.  Patient used to drink alcohol heavily, but has not been doing so for several years now.  Home Medications Prior to Admission medications   Medication Sig Start Date End Date Taking? Authorizing Provider  ALPRAZolam Prudy Feeler) 1 MG tablet Take 1 mg by mouth 3 (three) times daily. 10/09/15  Yes [provider]  ELIQUIS 2.5 MG TABS tablet TAKE 1 TABLET BY MOUTH TWICE A DAY 12/28/22  Yes Heilingoetter, Cassandra L, [name]  ferrous sulfate 325 (65 FE) MG EC tablet Take 325 mg by mouth daily.   Yes [provider]  folic acid (FOLVITE) 1 MG tablet Take 1 mg by mouth daily.   Yes [provider]  lisinopril-hydrochlorothiazide (PRINZIDE,ZESTORETIC) 20-25 MG tablet Take 1 tablet by mouth daily.   Yes [provider]  Multiple Vitamins-Minerals (MULTIVITAMIN ADULT PO) Take by mouth.   Yes [provider]  Potassium 99 MG TABS Take 1 tablet by mouth daily.   Yes [provider]  Psyllium (METAMUCIL PO) Take 1 capsule by mouth daily.   Yes [provider]  tetrahydrozoline 0.05 % ophthalmic solution Place 1 drop into both eyes 2 (two) times  daily as needed (eye irritation).   Yes [provider]      Allergies    Nsaids    Review of Systems   Review of Systems  All other systems reviewed and are negative.   Physical Exam Updated Vital Signs BP (!) 175/90 (BP Location: Right Arm)   Pulse 92   Temp 97.9 F (36.6 C) (Oral)   Resp 16   SpO2 96%  Physical Exam Vitals and nursing note reviewed.  Constitutional:      Appearance: He is well-developed.  HENT:     Head: Atraumatic.  Cardiovascular:     Rate and Rhythm: Normal rate.  Pulmonary:     Effort: Pulmonary effort is normal.  Abdominal:     Tenderness: There is no abdominal tenderness.  Musculoskeletal:     Cervical back: Neck supple.  Skin:    General: Skin is warm.     Coloration: Skin is jaundiced.  Neurological:     Mental Status: He is alert and oriented to person, place, and time.     ED Results / Procedures / Treatments   Labs (all labs ordered are listed, but only abnormal results are displayed) Labs Reviewed  LACTATE DEHYDROGENASE - Abnormal; Notable for the following components:      Result Value   LDH 269 (*)    All other components within normal limits  BILIRUBIN, DIRECT - Abnormal; Notable for the following components:   Bilirubin,  Direct 14.9 (*)    All other components within normal limits  PROTIME-INR    EKG None  Radiology CT ABDOMEN PELVIS W CONTRAST  Result Date: 01/12/2023 CLINICAL DATA:  Biliary obstruction suspected. Elevated bilirubin. New onset left abdominal and back pain. Hematuria. Bilirubin 21.6. EXAM: CT ABDOMEN AND PELVIS WITH CONTRAST TECHNIQUE: Multidetector CT imaging of the abdomen and pelvis was performed using the standard protocol following bolus administration of intravenous contrast. RADIATION DOSE REDUCTION: This exam was performed according to the departmental dose-optimization program which includes automated exposure control, adjustment of the mA and/or kV according to patient size and/or use  of iterative reconstruction technique. CONTRAST:  OMNIPAQUE IOHEXOL 300 MG/ML  SOLN COMPARISON:  09/05/2014 FINDINGS: Lower chest: Lung bases are clear. Hepatobiliary: Diffuse fatty infiltration of the liver. A few scattered subcentimeter cysts are present, mildly enlarged since prior study. Imaging features are consistent with benign cysts and no imaging follow-up is indicated. The gallbladder and bile ducts are unremarkable. No intra or extrahepatic bile duct dilatation. Pancreas: Unremarkable. No pancreatic ductal dilatation or surrounding inflammatory changes. Spleen: Normal in size without focal abnormality. Adrenals/Urinary Tract: Adrenal glands are unremarkable. Kidneys are normal, without renal calculi, focal lesion, or hydronephrosis. Bladder is unremarkable. Stomach/Bowel: Stomach, small bowel, and colon are not abnormally distended. No wall thickening or inflammatory changes. Colonic diverticulosis without evidence of acute diverticulitis. Appendix is normal. Vascular/Lymphatic: Aortic atherosclerosis. No enlarged abdominal or pelvic lymph nodes. Reproductive: Prostate is unremarkable. Other: No abdominal wall hernia or abnormality. No abdominopelvic ascites. Musculoskeletal: Degenerative changes in the spine. No destructive bone lesions. IMPRESSION: 1. Mild diffuse fatty infiltration of the liver. 2. No intra or extrahepatic bile duct dilatation. No obstructing masses or stones demonstrated. 3. Aortic atherosclerosis. Electronically Signed   By: Burman Nieves M.D.   On: 01/12/2023 15:43    Procedures .Critical Care  Performed by: Derwood Kaplan, MD Authorized by: Derwood Kaplan, MD   Critical care provider statement:    Critical care time (minutes):  38   Critical care was necessary to treat or prevent imminent or life-threatening deterioration of the following conditions: Hyperbilirubinemia, bilirubin over 21.   Critical care was time spent personally by me on the following  activities:  Development of treatment plan with patient or surrogate, discussions with consultants, evaluation of patient's response to treatment, examination of patient, ordering and review of laboratory studies, ordering and review of radiographic studies, ordering and performing treatments and interventions, pulse oximetry, re-evaluation of patient's condition and review of old charts     Medications Ordered in ED Medications  oxyCODONE-acetaminophen (PERCOCET/ROXICET) 5-325 MG per tablet 1 tablet (1 tablet Oral Not Given 01/12/23 1646)  predniSONE (DELTASONE) tablet 80 mg (has no administration in time range)  ALPRAZolam (XANAX) tablet 1 mg (1 mg Oral Given 01/12/23 1437)  iohexol (OMNIPAQUE) 300 MG/ML solution 100 mL (100 mLs Intravenous Contrast Given 01/12/23 1447)    ED Course/ Medical Decision Making/ A&P Clinical Course as of 01/12/23 1736  Tue Jan 12, 2023  1736 Oncology recommends that patient be started on high-dose steroids.  They will see the patient. [AN]    Clinical Course User Index [AN] Derwood Kaplan, MD                             Medical Decision Making Amount and/or Complexity of Data Reviewed Labs: ordered. Radiology: ordered.  Risk Prescription drug management. Decision regarding hospitalization.   52 year old with history of paroxysmal  nocturnal hemoglobinuria comes in with chief complaint of abnormal labs.  Patient is jaundiced.  He has no other significant past medical history.  He used to drink heavily, but has stopped in over 10 years now.  It appears that he has had hyperbilirubinemia in the past, but not to this level.  He has had hemolytic anemia as well.    Final Clinical Impression(s) / ED Diagnoses Final diagnoses:  Hyperbilirubinemia    Rx / DC Orders ED Discharge Orders     None         Derwood Kaplan, MD 01/12/23 1736

## 2023-01-13 DIAGNOSIS — D696 Thrombocytopenia, unspecified: Secondary | ICD-10-CM | POA: Diagnosis not present

## 2023-01-13 DIAGNOSIS — I10 Essential (primary) hypertension: Secondary | ICD-10-CM

## 2023-01-13 DIAGNOSIS — D595 Paroxysmal nocturnal hemoglobinuria [Marchiafava-Micheli]: Secondary | ICD-10-CM | POA: Diagnosis not present

## 2023-01-13 DIAGNOSIS — R7989 Other specified abnormal findings of blood chemistry: Secondary | ICD-10-CM

## 2023-01-13 LAB — CBC
HCT: 41.6 % (ref 39.0–52.0)
Hemoglobin: 14.3 g/dL (ref 13.0–17.0)
MCH: 38.6 pg — ABNORMAL HIGH (ref 26.0–34.0)
MCHC: 34.4 g/dL (ref 30.0–36.0)
MCV: 112.4 fL — ABNORMAL HIGH (ref 80.0–100.0)
Platelets: 95 10*3/uL — ABNORMAL LOW (ref 150–400)
RBC: 3.7 MIL/uL — ABNORMAL LOW (ref 4.22–5.81)
RDW: 13 % (ref 11.5–15.5)
WBC: 4.8 10*3/uL (ref 4.0–10.5)
nRBC: 0 % (ref 0.0–0.2)

## 2023-01-13 LAB — COMPREHENSIVE METABOLIC PANEL
ALT: 146 U/L — ABNORMAL HIGH (ref 0–44)
AST: 98 U/L — ABNORMAL HIGH (ref 15–41)
Albumin: 4 g/dL (ref 3.5–5.0)
Alkaline Phosphatase: 78 U/L (ref 38–126)
Anion gap: 10 (ref 5–15)
BUN: 12 mg/dL (ref 6–20)
CO2: 25 mmol/L (ref 22–32)
Calcium: 9 mg/dL (ref 8.9–10.3)
Chloride: 96 mmol/L — ABNORMAL LOW (ref 98–111)
Creatinine, Ser: 0.87 mg/dL (ref 0.61–1.24)
GFR, Estimated: >60 mL/min (ref >60)
Glucose, Bld: 145 mg/dL — ABNORMAL HIGH (ref 70–99)
Potassium: 4 mmol/L (ref 3.5–5.1)
Sodium: 131 mmol/L — ABNORMAL LOW (ref 135–145)
Total Bilirubin: 10.2 mg/dL — ABNORMAL HIGH (ref 0.3–1.2)
Total Protein: 7.5 g/dL (ref 6.5–8.1)

## 2023-01-13 LAB — HIV ANTIBODY (ROUTINE TESTING W REFLEX): HIV Screen 4th Generation wRfx: NONREACTIVE

## 2023-01-13 LAB — PROTIME-INR
INR: 1.1 (ref 0.8–1.2)
Prothrombin Time: 13.9 seconds (ref 11.4–15.2)

## 2023-01-13 LAB — HEPATITIS PANEL, ACUTE
HCV Ab: NONREACTIVE
Hep A IgM: NONREACTIVE
Hep B C IgM: NONREACTIVE
Hepatitis B Surface Ag: NONREACTIVE

## 2023-01-13 MED ORDER — PREDNISONE 20 MG PO TABS: ORAL_TABLET | ORAL | 0 refills | Status: DC

## 2023-01-13 NOTE — Discharge Summary (Signed)
Physician Discharge Summary   Patient: Travis Mcdaniel MRN: [id number] DOB: 12-Apr-1971  Admit date:     01/12/2023  Discharge date: 01/13/23  Discharge Physician: Meredeth Ide   PCP: [name], MD   Recommendations at discharge:   Follow-up oncology in 1 week  Discharge Diagnoses: Principal Problem:   Hyperbilirubinemia Active Problems:   Paroxysmal nocturnal hemoglobinuria (PNH) (HCC)   Elevated LFTs   Thrombocytopenia (HCC)   Hypertension   Anxiety  Resolved Problems:   * No resolved hospital problems. *  Hospital Course:   52 y.o. male with medical history significant of HTN, paroxysmal nocturnal hemoglobinuria followed by Dr. Arbutus Ped who was seen at cancer center today and was sent over to ED due to jaundice and elevated bilirubin. He states he noticed his eyes were getting yellow yesterday.  He also noticed his urine was orange/rusty color yesterday. When he woke up he noticed his skin turning yellow today. He had some discomfort in his epigastric area and his appetite has been poor. He had one episode of vomiting yesterday. Denies any nausea now. Denies any sick contacts.     Assessment and Plan:  * Hyperbilirubinemia 52 year old male presenting to ED after being seen at cancer center and found to be jaundiced with elevated bilirubin level >20  -CT abdomen/pelvis: no acute finding specifically hepatic or bilary duct/system. No obstruction. Normal spleen  -concern related to PNH and hemolysis.  Total bilirubin elevated at 21.6, improved to 10.2 after starting prednisone.  Indirect bilirubin 8.7, direct bilirubin 12.8 yesterday.  LDH 269, haptoglobin pending . -hematology following, had recommended 1mg /kg prednisone  -Called and discussed with Dr. Arbutus Ped -Will discharge on prednisone taper, 80 mg daily for a week, 60 mg daily for a week, then 40 mg daily for a week then 20 mg daily for a week then 10 mg daily for a week then 5 mg daily for a week and then  stop -Follow-up oncology as outpatient   Paroxysmal nocturnal hemoglobinuria (PNH) (HCC) -Followed by Dr. Sofie Hartigan and [facility]  - Currently on Ultomiris (Ravulizumab) loading dose 2700 mg/M2 followed by maintenance dose 3300 mg/M2 every 8 weeks.  First dose of treatment September 22, 2018.  He status post the induction dose of the treatment as well as 24 cycles of maintenance therapy. Last received infusion on 12/23/22.  -Eliquis restarted in the hospital  Elevated LFTs Chronically elevated, he states worse if he has even a little bit of alcohol Baseline line 70-160 Both AST/ALT at upper limits CT abdomen with fatty liver, but no other abnormal fidings Check hepatitis panel ; nonreactive INR wnl  No abdominal pain  Likely in setting of alcohol use, counseled against alcohol use  Thrombocytopenia (HCC) Recent baseline around 120 Platelets of 94K today INR wnl    Hypertension Continue lisinopril-hydrochlorothiazide   Anxiety Continue xanax 1mg  TID PRN          Consultants: Oncology Procedures performed:   Disposition: Home Diet recommendation:  Discharge Diet Orders (From admission, onward)     Start     Ordered   01/13/23 0000  Diet - low sodium heart healthy        01/13/23 1109           Regular diet DISCHARGE MEDICATION: Allergies as of 01/13/2023       Reactions   Nsaids Other (See Comments)   Due to the type of Cancer the Patient has, he can't take NSAIDS. Bone Marrow Cancer  Medication List     TAKE these medications    ALPRAZolam 1 MG tablet Commonly known as: XANAX Take 1 mg by mouth 3 (three) times daily.   Eliquis 2.5 MG Tabs tablet Generic drug: apixaban TAKE 1 TABLET BY MOUTH TWICE A DAY   ferrous sulfate 325 (65 FE) MG EC tablet Take 325 mg by mouth daily.   folic acid 1 MG tablet Commonly known as: FOLVITE Take 1 mg by mouth daily.   lisinopril-hydrochlorothiazide 20-25 MG tablet Commonly known as: ZESTORETIC Take 1 tablet  by mouth daily.   METAMUCIL PO Take 1 capsule by mouth daily.   MULTIVITAMIN ADULT PO Take by mouth.   Potassium 99 MG Tabs Take 1 tablet by mouth daily.   predniSONE 20 MG tablet Commonly known as: DELTASONE Prednisone 80 mg po daily x 1 week then Prednisone 60 mg po daily x 1 week then Prednisone 40 mg po daily x 1 week then Prednisone 20 mg daily x 1 week then Prednisone 10 mg po daily x 1 week then Prednisone 5 mg po daily x 1 week then stop.. Start taking on: January 14, 2023   tetrahydrozoline 0.05 % ophthalmic solution Place 1 drop into both eyes 2 (two) times daily as needed (eye irritation).        Discharge Exam: Filed Weights   01/12/23 1847  Weight: 80 kg   General-appears in no acute distress Heart-S1-S2, regular, no murmur auscultated Lungs-clear to auscultation bilaterally, no wheezing or crackles auscultated Abdomen-soft, nontender, no organomegaly Extremities-no edema in the lower extremities Neuro-alert, oriented x3, no focal deficit noted  Condition at discharge: good  The results of significant diagnostics from this hospitalization (including imaging, microbiology, ancillary and laboratory) are listed below for reference.   Imaging Studies: CT ABDOMEN PELVIS W CONTRAST  Result Date: 01/12/2023 CLINICAL DATA:  Biliary obstruction suspected. Elevated bilirubin. New onset left abdominal and back pain. Hematuria. Bilirubin 21.6. EXAM: CT ABDOMEN AND PELVIS WITH CONTRAST TECHNIQUE: Multidetector CT imaging of the abdomen and pelvis was performed using the standard protocol following bolus administration of intravenous contrast. RADIATION DOSE REDUCTION: This exam was performed according to the departmental dose-optimization program which includes automated exposure control, adjustment of the mA and/or kV according to patient size and/or use of iterative reconstruction technique. CONTRAST:  OMNIPAQUE IOHEXOL 300 MG/ML  SOLN COMPARISON:  09/05/2014 FINDINGS:  Lower chest: Lung bases are clear. Hepatobiliary: Diffuse fatty infiltration of the liver. A few scattered subcentimeter cysts are present, mildly enlarged since prior study. Imaging features are consistent with benign cysts and no imaging follow-up is indicated. The gallbladder and bile ducts are unremarkable. No intra or extrahepatic bile duct dilatation. Pancreas: Unremarkable. No pancreatic ductal dilatation or surrounding inflammatory changes. Spleen: Normal in size without focal abnormality. Adrenals/Urinary Tract: Adrenal glands are unremarkable. Kidneys are normal, without renal calculi, focal lesion, or hydronephrosis. Bladder is unremarkable. Stomach/Bowel: Stomach, small bowel, and colon are not abnormally distended. No wall thickening or inflammatory changes. Colonic diverticulosis without evidence of acute diverticulitis. Appendix is normal. Vascular/Lymphatic: Aortic atherosclerosis. No enlarged abdominal or pelvic lymph nodes. Reproductive: Prostate is unremarkable. Other: No abdominal wall hernia or abnormality. No abdominopelvic ascites. Musculoskeletal: Degenerative changes in the spine. No destructive bone lesions. IMPRESSION: 1. Mild diffuse fatty infiltration of the liver. 2. No intra or extrahepatic bile duct dilatation. No obstructing masses or stones demonstrated. 3. Aortic atherosclerosis. Electronically Signed   By: Burman Nieves M.D.   On: 01/12/2023 15:43    Microbiology: Results  for orders placed or performed in visit on 02/27/15  TECHNOLOGIST REVIEW     Status: None   Collection Time: 02/27/15  8:03 AM  Result Value Ref Range Status   Technologist Review Rare nRBC present  Final    Labs: CBC: Recent Labs  Lab 01/12/23 1150 01/12/23 2047 01/13/23 0451  WBC 8.4 6.8 4.8  NEUTROABS 6.9  --   --   HGB 15.1 14.6 14.3  HCT 41.0 41.3 41.6  MCV 107.9* 111.3* 112.4*  PLT 94* 83* 95*   Basic Metabolic Panel: Recent Labs  Lab 01/12/23 1150 01/13/23 0451  NA 133* 131*   K 3.5 4.0  CL 98 96*  CO2 23 25  GLUCOSE 110* 145*  BUN 8 12  CREATININE 1.19 0.87  CALCIUM 9.5 9.0   Liver Function Tests: Recent Labs  Lab 01/12/23 1150 01/12/23 2047 01/13/23 0451  AST 169*  --  98*  ALT 163*  --  146*  ALKPHOS 89  --  78  BILITOT 21.6* 21.5* 10.2*  PROT 6.9  --  7.5  ALBUMIN 4.4  --  4.0   CBG: No results for input(s): "GLUCAP" in the last 168 hours.  Discharge time spent: greater than 30 minutes.  Signed: Meredeth Ide, MD [facility] 01/13/2023

## 2023-01-13 NOTE — Plan of Care (Signed)
   Problem: Education: Goal: Knowledge of General Education information will improve Description: Including pain rating scale, medication(s)/side effects and non-pharmacologic comfort measures Outcome: Progressing   Problem: Clinical Measurements: Goal: Cardiovascular complication will be avoided Outcome: Progressing   Problem: Activity: Goal: Risk for activity intolerance will decrease Outcome: Progressing   Problem: Nutrition: Goal: Adequate nutrition will be maintained Outcome: Progressing   Problem: Coping: Goal: Level of anxiety will decrease Outcome: Progressing   Problem: Elimination: Goal: Will not experience complications related to bowel motility Outcome: Progressing

## 2023-01-13 NOTE — TOC Transition Note (Signed)
Transition of Care [facility]) - CM/SW Discharge Note   Patient Details  Name: Travis Mcdaniel MRN: [id number] Date of Birth: Mar 06, 1971  Transition of Care [facility]) CM/SW Contact:  Lanier Clam, RN Phone Number: 01/13/2023, 11:21 AM   Clinical Narrative:   d/c home no needs or orders.    Final next level of care: Home/Self Care Barriers to Discharge: No Barriers Identified   Patient Goals and CMS Choice      Discharge Placement                         Discharge Plan and Services Additional resources added to the After Visit Summary for                                       Social Determinants of Health (SDOH) Interventions SDOH Screenings   Food Insecurity: No Food Insecurity (01/12/2023)  Housing: Low Risk  (01/12/2023)  Transportation Needs: No Transportation Needs (01/12/2023)  Utilities: Not At Risk (01/12/2023)  Tobacco Use: Low Risk  (01/12/2023)     Readmission Risk Interventions     No data to display

## 2023-01-14 LAB — HAPTOGLOBIN: Haptoglobin: <10 mg/dL — ABNORMAL LOW (ref 29–370)

## 2023-01-15 ENCOUNTER — Other Ambulatory Visit: Payer: Self-pay | Admitting: *Deleted

## 2023-01-20 ENCOUNTER — Telehealth: Payer: Self-pay

## 2023-01-20 ENCOUNTER — Other Ambulatory Visit: Payer: Self-pay

## 2023-01-20 ENCOUNTER — Inpatient Hospital Stay: Payer: BC Managed Care – PPO

## 2023-01-20 DIAGNOSIS — D595 Paroxysmal nocturnal hemoglobinuria [Marchiafava-Micheli]: Secondary | ICD-10-CM | POA: Diagnosis present

## 2023-01-20 LAB — CBC WITH DIFFERENTIAL (CANCER CENTER ONLY)
Abs Immature Granulocytes: 0.07 10*3/uL (ref 0.00–0.07)
Basophils Absolute: 0 10*3/uL (ref 0.0–0.1)
Basophils Relative: 0 %
Eosinophils Absolute: 0 10*3/uL (ref 0.0–0.5)
Eosinophils Relative: 0 %
HCT: 36.1 % — ABNORMAL LOW (ref 39.0–52.0)
Hemoglobin: 12.9 g/dL — ABNORMAL LOW (ref 13.0–17.0)
Immature Granulocytes: 1 %
Lymphocytes Relative: 6 %
Lymphs Abs: 0.4 10*3/uL — ABNORMAL LOW (ref 0.7–4.0)
MCH: 39 pg — ABNORMAL HIGH (ref 26.0–34.0)
MCHC: 35.7 g/dL (ref 30.0–36.0)
MCV: 109.1 fL — ABNORMAL HIGH (ref 80.0–100.0)
Monocytes Absolute: 0.1 10*3/uL (ref 0.1–1.0)
Monocytes Relative: 1 %
Neutro Abs: 6.4 10*3/uL (ref 1.7–7.7)
Neutrophils Relative %: 92 %
Platelet Count: 166 10*3/uL (ref 150–400)
RBC: 3.31 MIL/uL — ABNORMAL LOW (ref 4.22–5.81)
RDW: 13 % (ref 11.5–15.5)
WBC Count: 7 10*3/uL (ref 4.0–10.5)
nRBC: 0 % (ref 0.0–0.2)

## 2023-01-20 LAB — CMP (CANCER CENTER ONLY)
ALT: 258 U/L — ABNORMAL HIGH (ref 0–44)
AST: 96 U/L — ABNORMAL HIGH (ref 15–41)
Albumin: 4.4 g/dL (ref 3.5–5.0)
Alkaline Phosphatase: 62 U/L (ref 38–126)
Anion gap: 8 (ref 5–15)
BUN: 19 mg/dL (ref 6–20)
CO2: 26 mmol/L (ref 22–32)
Calcium: 9.4 mg/dL (ref 8.9–10.3)
Chloride: 101 mmol/L (ref 98–111)
Creatinine: 0.91 mg/dL (ref 0.61–1.24)
GFR, Estimated: >60 mL/min (ref >60)
Glucose, Bld: 105 mg/dL — ABNORMAL HIGH (ref 70–99)
Potassium: 3.8 mmol/L (ref 3.5–5.1)
Sodium: 135 mmol/L (ref 135–145)
Total Bilirubin: 4.1 mg/dL (ref 0.3–1.2)
Total Protein: 6.5 g/dL (ref 6.5–8.1)

## 2023-01-20 NOTE — Telephone Encounter (Signed)
CRITICAL VALUE STICKER  CRITICAL VALUE:  TOTAL BILIRUBIN - 4.1  RECEIVER (on-site recipient of call): Kare Dado P. LPN   DATE & TIME NOTIFIED: 01/20/23 1203p  MESSENGER (representative from lab): Skeet Simmer  MD NOTIFIED: Dr. Arbutus Ped

## 2023-02-14 NOTE — Progress Notes (Unsigned)
[facility] OFFICE PROGRESS NOTE  [name], MD [address] 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   PRIOR THERAPY: Soliris (Eculizumab) initially with induction 600 MG IV weekly for 4 weeks and then switched to maintenance treatment 900 MG IV every 2 weeks. First dose of treatment was given on 11/21/2014. Status post 45 cycles.  After cycle #45 the patient received his treatment at outside infusion center.  Status post 16 more cycles of treatment.  This was discontinued secondary to worsening symptoms and lack of control of his disease.   CURRENT THERAPY: Ultomiris (Ravulizumab) loading dose 2700 mg/M2 followed by maintenance dose 3300 mg/M2 every 8 weeks.  First dose of treatment September 22, 2018.  He status post the induction dose of the treatment. The first 3 cycles of his treatment were given at the infusion center in [facility].  He has received 8 cycles at the  [facility] health cancer Center.   INTERVAL HISTORY: Travis Mcdaniel 52 y.o. male returns to the clinic for a follow-up visit.  The patient was last seen on 12/23/22. He is currently undergoing treatment with Ultomiris every 2 months. In the interval since last being seen, he presented to the [facility] with the chief complaint of jaundice. He had significantly elevated bilirubin of 21.6 compared to 2.8 on 12/23/22. His AST and ALT were elevated at 169 and 163 respectively. His Hbg was suprisingly normal at 15.1. His LDH was elevated, but only slightly elevated compared to his baseline at 269. He was subsequently sent to the ER for further evaluation. CT abdomen/pelvis: no acute finding specifically hepatic or bilary duct/system. No obstruction. Normal spleen. 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 He was started on prednisone. He is currently on *** mg. ***he completed this on ***prednisone taper,  80 mg daily for a week, 60 mg daily for a week, then 40 mg daily for a week then 20 mg daily for a week then 10 mg daily for a week then 5 mg daily for a week and then stop ***  Since last being seen, he is feeling ***. he states his energy is about the same., he could tell he had some more fatigue starting last week. He denies any fever, chills, night sweats, or weight loss.  Denies any recent nausea.  He reports some dyspnea on exertion, such as climbing the stairs.  He denies any chest pain, cough, or hemoptysis. He denies any vomiting, diarrhea, constipation, or abdominal pain.  He denies any dark urine or jaundice.  He is compliant with his folic acid and iron supplement.  He is currently on a blood thinner for DVT prophylaxis. He reports he was never diagnosed with cirrhosis or liver disease. He is here for evaluation and repeat blood work before starting his next cycle of treatment.      MEDICAL HISTORY: Past Medical History:  Diagnosis Date   Cancer (HCC)    PNH (Bone marrow)   Diverticulosis    Encounter for antineoplastic chemotherapy 01/15/2016   Hypertension     ALLERGIES:  is allergic to nsaids.  MEDICATIONS:  Current Outpatient Medications  Medication Sig Dispense Refill   ALPRAZolam (XANAX) 1 MG tablet Take 1 mg by mouth 3 (three) times daily.  2   ELIQUIS 2.5 MG TABS tablet TAKE 1 TABLET BY MOUTH TWICE A DAY 60 tablet 2   ferrous sulfate 325 (65 FE)  MG EC tablet Take 325 mg by mouth daily.     folic acid (FOLVITE) 1 MG tablet Take 1 mg by mouth daily.     lisinopril-hydrochlorothiazide (PRINZIDE,ZESTORETIC) 20-25 MG tablet Take 1 tablet by mouth daily.     Multiple Vitamins-Minerals (MULTIVITAMIN ADULT PO) Take by mouth.     Potassium 99 MG TABS Take 1 tablet by mouth daily.     predniSONE (DELTASONE) 20 MG tablet Prednisone 80 mg po daily x 1 week then Prednisone 60 mg po daily x 1 week then Prednisone 40 mg po daily x 1 week then Prednisone 20 mg daily x 1 week then  Prednisone 10 mg po daily x 1 week then Prednisone 5 mg po daily x 1 week then stop.. 75 tablet 0   Psyllium (METAMUCIL PO) Take 1 capsule by mouth daily.     tetrahydrozoline 0.05 % ophthalmic solution Place 1 drop into both eyes 2 (two) times daily as needed (eye irritation).     No current facility-administered medications for this visit.    SURGICAL HISTORY: No past surgical history on file.  REVIEW OF SYSTEMS:   Review of Systems  Constitutional: Negative for appetite change, chills, fatigue, fever and unexpected weight change.  HENT:   Negative for mouth sores, nosebleeds, sore throat and trouble swallowing.   Eyes: Negative for eye problems and icterus.  Respiratory: Negative for cough, hemoptysis, shortness of breath and wheezing.   Cardiovascular: Negative for chest pain and leg swelling.  Gastrointestinal: Negative for abdominal pain, constipation, diarrhea, nausea and vomiting.  Genitourinary: Negative for bladder incontinence, difficulty urinating, dysuria, frequency and hematuria.   Musculoskeletal: Negative for back pain, gait problem, neck pain and neck stiffness.  Skin: Negative for itching and rash.  Neurological: Negative for dizziness, extremity weakness, gait problem, headaches, light-headedness and seizures.  Hematological: Negative for adenopathy. Does not bruise/bleed easily.  Psychiatric/Behavioral: Negative for confusion, depression and sleep disturbance. The patient is not nervous/anxious.     PHYSICAL EXAMINATION:  There were no vitals taken for this visit.  ECOG PERFORMANCE STATUS: {CHL ONC ECOG [id number]  Physical Exam  Constitutional: Oriented to person, place, and time and well-developed, well-nourished, and in no distress. No distress.  HENT:  Head: Normocephalic and atraumatic.  Mouth/Throat: Oropharynx is clear and moist. No oropharyngeal exudate.  Eyes: Conjunctivae are normal. Right eye exhibits no discharge. Left eye exhibits no discharge.  No scleral icterus.  Neck: Normal range of motion. Neck supple.  Cardiovascular: Normal rate, regular rhythm, normal heart sounds and intact distal pulses.   Pulmonary/Chest: Effort normal and breath sounds normal. No respiratory distress. No wheezes. No rales.  Abdominal: Soft. Bowel sounds are normal. Exhibits no distension and no mass. There is no tenderness.  Musculoskeletal: Normal range of motion. Exhibits no edema.  Lymphadenopathy:    No cervical adenopathy.  Neurological: Alert and oriented to person, place, and time. Exhibits normal muscle tone. Gait normal. Coordination normal.  Skin: Skin is warm and dry. No rash noted. Not diaphoretic. No erythema. No pallor.  Psychiatric: Mood, memory and judgment normal.  Vitals reviewed.  LABORATORY DATA: Lab Results  Component Value Date   WBC 7.0 01/20/2023   HGB 12.9 (L) 01/20/2023   HCT 36.1 (L) 01/20/2023   MCV 109.1 (H) 01/20/2023   PLT 166 01/20/2023      Chemistry      Component Value Date/Time   NA 135 01/20/2023 1111   NA 136 06/28/2017 1236   K 3.8 01/20/2023 1111  K 4.1 06/28/2017 1236   CL 101 01/20/2023 1111   CO2 26 01/20/2023 1111   CO2 24 06/28/2017 1236   BUN 19 01/20/2023 1111   BUN 17.1 06/28/2017 1236   CREATININE 0.91 01/20/2023 1111   CREATININE 1.0 06/28/2017 1236      Component Value Date/Time   CALCIUM 9.4 01/20/2023 1111   CALCIUM 9.1 06/28/2017 1236   ALKPHOS 62 01/20/2023 1111   ALKPHOS 66 06/28/2017 1236   AST 96 (H) 01/20/2023 1111   AST 40 (H) 06/28/2017 1236   ALT 258 (H) 01/20/2023 1111   ALT 60 (H) 06/28/2017 1236   BILITOT 4.1 (HH) 01/20/2023 1111   BILITOT 2.87 (H) 06/28/2017 1236       RADIOGRAPHIC STUDIES:  No results found.   ASSESSMENT/PLAN:  This is a very pleasant 52 year old Caucasian male with paroxysmal nocturnal hemoglobinuria.  He been on treatment with Solaris on days 1 and 15 every 4 weeks.  He is status post 45 cycles.  He received an additional 19 cycles an  outside infusion center.  He previously completed a tapering dose of prednisone that was started on September/October 2019 for an episode of hemolytic anemia. His last prednisone taper was in November 2021.    Due to convenience and his previous treatment with Solaris interfering with his work schedule/performance, he was interested in starting Ultomiris.  His insurance requires him to receive treatment from an outside infusion center due to the facility fee here at the cancer center.  His first dose was on May 6th, 2020. Status post induction dose followed by maintenance therapy.  He is currently receiving his infusion of the Ultomiris at the [place] infusion center. tarting from cycle #18 he received his treatment at the [facility] health [facility] Center in [place].    He continues to tolerate his treatment well with no concerning adverse effects   He recently had an episode of jaundice felt to be secondary to his PNH. He was placed on a prednisone taper which he cpmpleted on ***.   Labs were reviewed and I reviewed with Dr. Arbutus Ped. His Hbg is *** Bili elevated at 3.0. His LDH is ***.  His AST and ALT are elevated at *** and *** respectively which is likely related to the alcohol and tylenol. Dr. Arbutus Ped recommends that he proceed with treatment as planned with his LFTs. No dose adjustments necessary.   For DVT prophylaxis, he will continue on Eliquis 2.5 mg twice daily.  I sent a refill to the pharmacy.   We will see him back for follow-up visit in 8 weeks for evaluation repeat blood work before undergoing cycle #27  The patient was advised to call immediately if he has any concerning symptoms in the interval. The patient voices understanding of current disease status and treatment options and is in agreement with the current care plan. All questions were answered. The patient knows to call the clinic with any problems, questions or concerns. We can certainly see the patient much sooner if  necessary      No orders of the defined types were placed in this encounter.    I spent {CHL ONC TIME VISIT - YQMVH:[id number]} counseling the patient face to face. The total time spent in the appointment was {CHL ONC TIME VISIT - UXLKG:[id number]}.  Lamia Mariner L Shaney Deckman, [name] 02/14/23

## 2023-02-16 ENCOUNTER — Other Ambulatory Visit: Payer: Self-pay

## 2023-02-16 DIAGNOSIS — D598 Other acquired hemolytic anemias: Secondary | ICD-10-CM

## 2023-02-17 ENCOUNTER — Ambulatory Visit: Payer: BC Managed Care – PPO | Admitting: Internal Medicine

## 2023-02-17 ENCOUNTER — Inpatient Hospital Stay (HOSPITAL_BASED_OUTPATIENT_CLINIC_OR_DEPARTMENT_OTHER): Payer: BC Managed Care – PPO | Admitting: Physician Assistant

## 2023-02-17 ENCOUNTER — Inpatient Hospital Stay: Payer: BC Managed Care – PPO

## 2023-02-17 ENCOUNTER — Inpatient Hospital Stay: Payer: BC Managed Care – PPO | Attending: Internal Medicine

## 2023-02-17 DIAGNOSIS — D595 Paroxysmal nocturnal hemoglobinuria [Marchiafava-Micheli]: Secondary | ICD-10-CM | POA: Diagnosis present

## 2023-02-17 DIAGNOSIS — D598 Other acquired hemolytic anemias: Secondary | ICD-10-CM

## 2023-02-17 DIAGNOSIS — Z7952 Long term (current) use of systemic steroids: Secondary | ICD-10-CM | POA: Diagnosis not present

## 2023-02-17 DIAGNOSIS — Z7901 Long term (current) use of anticoagulants: Secondary | ICD-10-CM | POA: Diagnosis not present

## 2023-02-17 DIAGNOSIS — K760 Fatty (change of) liver, not elsewhere classified: Secondary | ICD-10-CM | POA: Insufficient documentation

## 2023-02-17 LAB — CBC WITH DIFFERENTIAL (CANCER CENTER ONLY)
Abs Immature Granulocytes: 0.05 10*3/uL (ref 0.00–0.07)
Basophils Absolute: 0 10*3/uL (ref 0.0–0.1)
Basophils Relative: 0 %
Eosinophils Absolute: 0 10*3/uL (ref 0.0–0.5)
Eosinophils Relative: 0 %
HCT: 41.4 % (ref 39.0–52.0)
Hemoglobin: 14.6 g/dL (ref 13.0–17.0)
Immature Granulocytes: 1 %
Lymphocytes Relative: 22 %
Lymphs Abs: 1.6 10*3/uL (ref 0.7–4.0)
MCH: 38 pg — ABNORMAL HIGH (ref 26.0–34.0)
MCHC: 35.3 g/dL (ref 30.0–36.0)
MCV: 107.8 fL — ABNORMAL HIGH (ref 80.0–100.0)
Monocytes Absolute: 0.6 10*3/uL (ref 0.1–1.0)
Monocytes Relative: 9 %
Neutro Abs: 5 10*3/uL (ref 1.7–7.7)
Neutrophils Relative %: 68 %
Platelet Count: 110 10*3/uL — ABNORMAL LOW (ref 150–400)
RBC: 3.84 MIL/uL — ABNORMAL LOW (ref 4.22–5.81)
RDW: 12.5 % (ref 11.5–15.5)
WBC Count: 7.4 10*3/uL (ref 4.0–10.5)
nRBC: 0 % (ref 0.0–0.2)

## 2023-02-17 LAB — CMP (CANCER CENTER ONLY)
ALT: 91 U/L — ABNORMAL HIGH (ref 0–44)
AST: 63 U/L — ABNORMAL HIGH (ref 15–41)
Albumin: 4.4 g/dL (ref 3.5–5.0)
Alkaline Phosphatase: 49 U/L (ref 38–126)
Anion gap: 9 (ref 5–15)
BUN: 19 mg/dL (ref 6–20)
CO2: 28 mmol/L (ref 22–32)
Calcium: 9.4 mg/dL (ref 8.9–10.3)
Chloride: 102 mmol/L (ref 98–111)
Creatinine: 1.04 mg/dL (ref 0.61–1.24)
GFR, Estimated: >60 mL/min (ref >60)
Glucose, Bld: 112 mg/dL — ABNORMAL HIGH (ref 70–99)
Potassium: 3.5 mmol/L (ref 3.5–5.1)
Sodium: 139 mmol/L (ref 135–145)
Total Bilirubin: 2.8 mg/dL — ABNORMAL HIGH (ref 0.3–1.2)
Total Protein: 7 g/dL (ref 6.5–8.1)

## 2023-02-17 LAB — LACTATE DEHYDROGENASE: LDH: 185 U/L (ref 98–192)

## 2023-02-17 MED ORDER — SODIUM CHLORIDE 0.9 % IV SOLN
3300.0000 mg | Freq: Once | INTRAVENOUS | Status: AC
  Administered 2023-02-17: 3300 mg via INTRAVENOUS
  Filled 2023-02-17: qty 33

## 2023-02-17 MED ORDER — SODIUM CHLORIDE 0.9 % IV SOLN: Freq: Once | INTRAVENOUS | Status: AC

## 2023-02-17 MED ORDER — PREDNISONE 5 MG PO TABS
5.0000 mg | ORAL_TABLET | Freq: Every day | ORAL | 0 refills | Status: DC
Start: 2023-02-17 — End: 2023-03-18

## 2023-02-17 NOTE — Patient Instructions (Signed)
[facility]  Discharge Instructions: Thank you for choosing [facility] to provide your oncology and hematology care.   If you have a lab appointment with the Cancer Center, please go directly to the Cancer Center and check in at the registration area.   Wear comfortable clothing and clothing appropriate for easy access to any Portacath or PICC line.   We strive to give you quality time with your provider. You may need to reschedule your appointment if you arrive late (15 or more minutes).  Arriving late affects you and other patients whose appointments are after yours.  Also, if you miss three or more appointments without notifying the office, you may be dismissed from the clinic at the provider's discretion.      For prescription refill requests, have your pharmacy contact our office and allow 72 hours for refills to be completed.    Today you received the following chemotherapy and/or immunotherapy agents: Ultomiris.      To help prevent nausea and vomiting after your treatment, we encourage you to take your nausea medication as directed.  BELOW ARE SYMPTOMS THAT SHOULD BE REPORTED IMMEDIATELY: *FEVER GREATER THAN 100.4 F (38 C) OR HIGHER *CHILLS OR SWEATING *NAUSEA AND VOMITING THAT IS NOT CONTROLLED WITH YOUR NAUSEA MEDICATION *UNUSUAL SHORTNESS OF BREATH *UNUSUAL BRUISING OR BLEEDING *URINARY PROBLEMS (pain or burning when urinating, or frequent urination) *BOWEL PROBLEMS (unusual diarrhea, constipation, pain near the anus) TENDERNESS IN MOUTH AND THROAT WITH OR WITHOUT PRESENCE OF ULCERS (sore throat, sores in mouth, or a toothache) UNUSUAL RASH, SWELLING OR PAIN  UNUSUAL VAGINAL DISCHARGE OR ITCHING   Items with * indicate a potential emergency and should be followed up as soon as possible or go to the Emergency Department if any problems should occur.  Please show the CHEMOTHERAPY ALERT CARD or IMMUNOTHERAPY ALERT CARD at  check-in to the Emergency Department and triage nurse.  Should you have questions after your visit or need to cancel or reschedule your appointment, please contact [facility]  Dept: [phone]  and follow the prompts.  Office hours are 8:00 a.m. to 4:30 p.m. Monday - Friday. Please note that voicemails left after 4:00 p.m. may not be returned until the following business day.  We are closed weekends and major holidays. You have access to a nurse at all times for urgent questions. Please call the main number to the clinic Dept: [phone] and follow the prompts.   For any non-urgent questions, you may also contact your provider using MyChart. We now offer e-Visits for anyone 18 and older to request care online for non-urgent symptoms. For details visit [email or web address].   Also download the MyChart app! Go to the app store, search "MyChart", open the app, select [place], and log in with your MyChart username and password.   

## 2023-02-18 ENCOUNTER — Ambulatory Visit: Payer: BC Managed Care – PPO | Admitting: Physician Assistant

## 2023-03-12 NOTE — Progress Notes (Unsigned)
[facility] OFFICE PROGRESS NOTE  [name], MD [address] 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   PRIOR THERAPY: Soliris (Eculizumab) initially with induction 600 MG IV weekly for 4 weeks and then switched to maintenance treatment 900 MG IV every 2 weeks. First dose of treatment was given on 11/21/2014. Status post 45 cycles.  After cycle #45 the patient received his treatment at outside infusion center.  Status post 16 more cycles of treatment.  This was discontinued secondary to worsening symptoms and lack of control of his disease.   CURRENT THERAPY: Ultomiris (Ravulizumab) loading dose 2700 mg/M2 followed by maintenance dose 3300 mg/M2 every 8 weeks.  First dose of treatment September 22, 2018.  He status post the induction dose of the treatment. The first 3 cycles of his treatment were given at the infusion center in [facility].  He has received 8 cycles at the  [facility] health cancer [facility].   INTERVAL HISTORY: Travis Mcdaniel 52 y.o. male returns to the clinic today for a follow-up visit.  The patient was last seen by Dr. Arbutus Ped and myself on 02/17/2023.  At his last appointment, the patient had concerns related to his current treatment and prednisone.  The patient was interested in considering treatment with Pegcetacoplan.  Dr. Arbutus Ped explained that he would consider changing treatment should his current treatment with Ultomiris fail. The patient also wanted to be on prednisone due to concerns about exacerbations in his PNH.  Therefore, he was started on low-dose prednisone 5 mg p.o. daily for now and we would revisit his labs in 1 month.  We also let him know, that we be happy to refer him to a tertiary center such as [facility] or [facility] to meet with a PNH expert.  The patient is wanting to continue his treatment here for the remainder of the year.  Since starting  prednisone, the patient is feeling well.  Of course his energy is not what it used to be or to his PNH diagnosis but his energy is better than when he was seen last month.  He reports his urine has been clear.  Denies any jaundice. Denies any fever, chills, or night sweats.  He is trying to lose weight since a repeat scan from the hospital showed fatty liver disease and he cut back on his sugar intake. He lost about 2 lbs since last being seen.  He denies any recent nausea.  He may have dyspnea on exertion on baseline if he goes up several steps.  Denies any cough, hemoptysis, or chest pain.  Denies any vomiting, diarrhea, constipation, or abdominal pain.  He has some chronic back pain can [place] to arthritis.  He is going to talk to his PCP about his arthritis.  He is compliant with his folic acid and iron supplement.  He is on his blood thinner for DVT prophylaxis.  He is here today for evaluation and repeat blood work.     MEDICAL HISTORY: Past Medical History:  Diagnosis Date   Cancer (HCC)    PNH (Bone marrow)   Diverticulosis    Encounter for antineoplastic chemotherapy 01/15/2016   Hypertension     ALLERGIES:  is allergic to nsaids.  MEDICATIONS:  Current Outpatient Medications  Medication Sig Dispense Refill   ALPRAZolam (XANAX) 1 MG tablet Take 1 mg by mouth 3 (three) times daily.  2   ELIQUIS 2.5 MG  TABS tablet TAKE 1 TABLET BY MOUTH TWICE A DAY 60 tablet 2   ferrous sulfate 325 (65 FE) MG EC tablet Take 325 mg by mouth daily.     folic acid (FOLVITE) 1 MG tablet Take 1 mg by mouth daily.     lisinopril-hydrochlorothiazide (PRINZIDE,ZESTORETIC) 20-25 MG tablet Take 1 tablet by mouth daily.     Multiple Vitamins-Minerals (MULTIVITAMIN ADULT PO) Take by mouth.     Potassium 99 MG TABS Take 1 tablet by mouth daily.     predniSONE (DELTASONE) 5 MG tablet Take 1 tablet (5 mg total) by mouth daily with breakfast. 30 tablet 0   Psyllium (METAMUCIL PO) Take 1 capsule by mouth daily.      tetrahydrozoline 0.05 % ophthalmic solution Place 1 drop into both eyes 2 (two) times daily as needed (eye irritation).     No current facility-administered medications for this visit.    SURGICAL HISTORY: No past surgical history on file.  REVIEW OF SYSTEMS:   Review of Systems  Constitutional: Positive for stable/improved fatigue. Negative for appetite change, chills, fever and unexpected weight change.  HENT: Negative for mouth sores, nosebleeds, sore throat and trouble swallowing.   Eyes: Negative for eye problems and icterus.  Respiratory: Negative for cough, hemoptysis, shortness of breath and wheezing.   Cardiovascular: Negative for chest pain and leg swelling.  Gastrointestinal: Negative for abdominal pain, constipation, diarrhea, nausea and vomiting.  Genitourinary: Negative for bladder incontinence, difficulty urinating, dysuria, frequency and hematuria.   Musculoskeletal: Positive for chronic back pain. Negative for gait problem, neck pain and neck stiffness.  Skin: Negative for itching and rash.  Neurological: Negative for dizziness, extremity weakness, gait problem, headaches, light-headedness and seizures.  Hematological: Negative for adenopathy. Does not bruise/bleed easily.  Psychiatric/Behavioral: Negative for confusion, depression and sleep disturbance. The patient is not nervous/anxious.     PHYSICAL EXAMINATION:  Blood pressure 120/67, pulse 66, temperature 97.9 F (36.6 C), temperature source Oral, resp. rate 13, weight 172 lb 1.6 oz (78.1 kg), SpO2 100%.  ECOG PERFORMANCE STATUS: 1  Physical Exam  Constitutional: Oriented to person, place, and time and well-developed, well-nourished, and in no distress.  HENT:  Head: Normocephalic and atraumatic.  Mouth/Throat: Oropharynx is clear and moist. No oropharyngeal exudate.  Eyes: Conjunctivae are normal. Right eye exhibits no discharge. Left eye exhibits no discharge. No scleral icterus.  Neck: Normal range of  motion. Neck supple.  Cardiovascular: Normal rate, regular rhythm, normal heart sounds and intact distal pulses.   Pulmonary/Chest: Effort normal and breath sounds normal. No respiratory distress. No wheezes. No rales.  Abdominal: Soft. Bowel sounds are normal. Exhibits no distension and no mass. There is no tenderness.  Musculoskeletal: Normal range of motion. Exhibits no edema.  Lymphadenopathy:    No cervical adenopathy.  Neurological: Alert and oriented to person, place, and time. Exhibits normal muscle tone. Gait normal. Coordination normal.  Skin: Skin is warm and dry. No rash noted. Not diaphoretic. No erythema. No pallor.  Psychiatric: Mood, memory and judgment normal.  Vitals reviewed.  LABORATORY DATA: Lab Results  Component Value Date   WBC 5.5 03/18/2023   HGB 12.1 (L) 03/18/2023   HCT 34.6 (L) 03/18/2023   MCV 112.0 (H) 03/18/2023   PLT 142 (L) 03/18/2023      Chemistry      Component Value Date/Time   NA 139 02/17/2023 0727   NA 136 06/28/2017 1236   K 3.5 02/17/2023 0727   K 4.1 06/28/2017 1236  CL 102 02/17/2023 0727   CO2 28 02/17/2023 0727   CO2 24 06/28/2017 1236   BUN 19 02/17/2023 0727   BUN 17.1 06/28/2017 1236   CREATININE 1.04 02/17/2023 0727   CREATININE 1.0 06/28/2017 1236      Component Value Date/Time   CALCIUM 9.4 02/17/2023 0727   CALCIUM 9.1 06/28/2017 1236   ALKPHOS 49 02/17/2023 0727   ALKPHOS 66 06/28/2017 1236   AST 63 (H) 02/17/2023 0727   AST 40 (H) 06/28/2017 1236   ALT 91 (H) 02/17/2023 0727   ALT 60 (H) 06/28/2017 1236   BILITOT 2.8 (H) 02/17/2023 0727   BILITOT 2.87 (H) 06/28/2017 1236       RADIOGRAPHIC STUDIES:  No results found.   ASSESSMENT/PLAN:  This is a very pleasant 52 year old Caucasian male with paroxysmal nocturnal hemoglobinuria.  He been on treatment with Solaris on days 1 and 15 every 4 weeks.  He is status post 45 cycles.  He received an additional 19 cycles an outside infusion center.  He previously  completed a tapering dose of prednisone that was started on September/October 2019 for an episode of hemolytic anemia. His last prednisone taper was in November 2021.    Due to convenience and his previous treatment with Solaris interfering with his work schedule/performance, he was interested in starting Ultomiris.  His insurance requires him to receive treatment from an outside infusion center due to the facility fee here at the cancer center.  His first dose was on May 6th, 2020. Status post induction dose followed by maintenance therapy.  He is currently receiving his infusion of the Ultomiris at the [place] infusion center. tarting from cycle #18 he received his treatment at the [facility] in [place].    He continues to tolerate his treatment well with no concerning adverse effects   At his last appointment, the patient had some concerns and was interested in considering treatment with Pegcetacoplan.  No Mohamed discussed that he would consider the patient for treatment if he had failure of Ultomiris.  Dr. Arbutus Ped also offered a referral to [facility] or [facility] or [facility] for second opinion which the patient declined and would like to continue his care here for the remainder of the year.  The patient was started on low-dose prednisone.  He is feeling well at this time.  He is here today for evaluation repeat blood work.  The patient was seen with Dr. Arbutus Ped.  His labs today show slightly worsening mild anemia with a hemoglobin of 12.1.  His urine is fairly stable at 2.9.  His AST and ALT are slightly elevated 48 and 65 respectively.  The patient also does have fatty liver disease.  He will continue on the same treatment at the same dose.  Continue the patient on 5 mg of prednisone.  I recommended that he start taking Prilosec.  We will see him back for follow-up visit in 1 month for evaluation repeat blood work before continuing his treatment with Ultomiris.  He  will continue taking Eliquis for thrombosis prophylaxis.  We also discussed his fatty liver disease today and the importance of seeing his PCP for risk factor management.  The patient was advised to call immediately if she has any concerning symptoms in the interval. The patient voices understanding of current disease status and treatment options and is in agreement with the current care plan. All questions were answered. The patient knows to call the clinic with any problems, questions or concerns.  We can certainly see the patient much sooner if necessary        Orders Placed This Encounter  Procedures   CBC with Differential (Cancer Center Only)    Standing Status:   Future    Standing Expiration Date:   03/17/2024   CMP (Cancer Center only)    Standing Status:   Future    Standing Expiration Date:   03/17/2024   Lactate dehydrogenase (LDH)    Standing Status:   Future    Standing Expiration Date:   03/17/2024      Hilaria Titsworth L Jazlynne Milliner, [name] 03/18/23  ADDENDUM: Hematology/Oncology Attending: I had a face-to-face encounter with the patient today.  I reviewed his record, lab and recommended his care plan.  This is a 52 years old white male with history of paroxysmal nocturnal hemoglobinuria diagnosed in June 2016.  He was treated initially with Solaris status post 45 cycles at the [facility] followed by 16 cycles at outside infusion center because of insurance coverage issues.  The patient had frequent episodes of hemolytic anemia and he was switched to Ultomiris on September 22, 2018 and has been tolerating this treatment fairly well.  He is status post 11 cycles.  He has been tolerating the treatment well except for few episodes of hemolysis and he is currently on prednisone 5 mg p.o. daily and tolerating it fairly well.  He returns back to work on as-needed basis.  He is here today for evaluation and repeat blood work. His blood work today is acceptable based on his  condition with decrease in hemoglobin to 12.1 and hematocrit 34.6 and improvement of his platelet count to 142,000.  He has stable LDH which is normal at 185.  Comprehensive metabolic panel showed total bilirubin of 2.9 with slightly elevated liver enzymes. I recommended for the patient to continue his current treatment with Ultomiris with the same dose for now. If he has worsening of his symptoms in the future and frequent episode of hemolytic anemia, we will consider him for treatment with Pegcetacoplan. He will continue his current treatment with Eliquis for the deep venous thrombosis prophylaxis. He will come back for follow-up visit in 4 weeks for evaluation before starting cycle #12. He was advised to call immediately if he has any other concerning symptoms in the interval.

## 2023-03-18 ENCOUNTER — Inpatient Hospital Stay: Payer: BC Managed Care – PPO | Attending: Internal Medicine | Admitting: Physician Assistant

## 2023-03-18 ENCOUNTER — Inpatient Hospital Stay: Payer: BC Managed Care – PPO

## 2023-03-18 VITALS — BP 120/67 | HR 66 | Temp 97.9°F | Resp 13 | Wt 172.1 lb

## 2023-03-18 DIAGNOSIS — Z7952 Long term (current) use of systemic steroids: Secondary | ICD-10-CM | POA: Diagnosis not present

## 2023-03-18 DIAGNOSIS — Z7901 Long term (current) use of anticoagulants: Secondary | ICD-10-CM | POA: Insufficient documentation

## 2023-03-18 DIAGNOSIS — D595 Paroxysmal nocturnal hemoglobinuria [Marchiafava-Micheli]: Secondary | ICD-10-CM

## 2023-03-18 DIAGNOSIS — K760 Fatty (change of) liver, not elsewhere classified: Secondary | ICD-10-CM | POA: Insufficient documentation

## 2023-03-18 LAB — CMP (CANCER CENTER ONLY)
ALT: 65 U/L — ABNORMAL HIGH (ref 0–44)
AST: 48 U/L — ABNORMAL HIGH (ref 15–41)
Albumin: 4.3 g/dL (ref 3.5–5.0)
Alkaline Phosphatase: 53 U/L (ref 38–126)
Anion gap: 8 (ref 5–15)
BUN: 16 mg/dL (ref 6–20)
CO2: 28 mmol/L (ref 22–32)
Calcium: 9.1 mg/dL (ref 8.9–10.3)
Chloride: 101 mmol/L (ref 98–111)
Creatinine: 0.97 mg/dL (ref 0.61–1.24)
GFR, Estimated: >60 mL/min (ref >60)
Glucose, Bld: 85 mg/dL (ref 70–99)
Potassium: 3.9 mmol/L (ref 3.5–5.1)
Sodium: 137 mmol/L (ref 135–145)
Total Bilirubin: 2.9 mg/dL — ABNORMAL HIGH (ref 0.3–1.2)
Total Protein: 6.7 g/dL (ref 6.5–8.1)

## 2023-03-18 LAB — CBC WITH DIFFERENTIAL (CANCER CENTER ONLY)
Abs Immature Granulocytes: 0.03 10*3/uL (ref 0.00–0.07)
Basophils Absolute: 0 10*3/uL (ref 0.0–0.1)
Basophils Relative: 1 %
Eosinophils Absolute: 0.1 10*3/uL (ref 0.0–0.5)
Eosinophils Relative: 1 %
HCT: 34.6 % — ABNORMAL LOW (ref 39.0–52.0)
Hemoglobin: 12.1 g/dL — ABNORMAL LOW (ref 13.0–17.0)
Immature Granulocytes: 1 %
Lymphocytes Relative: 24 %
Lymphs Abs: 1.3 10*3/uL (ref 0.7–4.0)
MCH: 39.2 pg — ABNORMAL HIGH (ref 26.0–34.0)
MCHC: 35 g/dL (ref 30.0–36.0)
MCV: 112 fL — ABNORMAL HIGH (ref 80.0–100.0)
Monocytes Absolute: 0.6 10*3/uL (ref 0.1–1.0)
Monocytes Relative: 11 %
Neutro Abs: 3.5 10*3/uL (ref 1.7–7.7)
Neutrophils Relative %: 62 %
Platelet Count: 142 10*3/uL — ABNORMAL LOW (ref 150–400)
RBC: 3.09 MIL/uL — ABNORMAL LOW (ref 4.22–5.81)
RDW: 14.6 % (ref 11.5–15.5)
WBC Count: 5.5 10*3/uL (ref 4.0–10.5)
nRBC: 0 % (ref 0.0–0.2)

## 2023-03-18 LAB — LACTATE DEHYDROGENASE: LDH: 185 U/L (ref 98–192)

## 2023-03-18 MED ORDER — PREDNISONE 5 MG PO TABS
5.0000 mg | ORAL_TABLET | Freq: Every day | ORAL | 0 refills | Status: DC
Start: 2023-03-18 — End: 2023-04-16

## 2023-04-14 ENCOUNTER — Inpatient Hospital Stay: Payer: BC Managed Care – PPO

## 2023-04-14 ENCOUNTER — Inpatient Hospital Stay (HOSPITAL_BASED_OUTPATIENT_CLINIC_OR_DEPARTMENT_OTHER): Payer: BC Managed Care – PPO | Admitting: Internal Medicine

## 2023-04-14 ENCOUNTER — Inpatient Hospital Stay: Payer: BC Managed Care – PPO | Attending: Internal Medicine

## 2023-04-14 DIAGNOSIS — Z86718 Personal history of other venous thrombosis and embolism: Secondary | ICD-10-CM | POA: Insufficient documentation

## 2023-04-14 DIAGNOSIS — Z7901 Long term (current) use of anticoagulants: Secondary | ICD-10-CM | POA: Diagnosis not present

## 2023-04-14 DIAGNOSIS — D595 Paroxysmal nocturnal hemoglobinuria [Marchiafava-Micheli]: Secondary | ICD-10-CM

## 2023-04-14 DIAGNOSIS — Z7952 Long term (current) use of systemic steroids: Secondary | ICD-10-CM | POA: Insufficient documentation

## 2023-04-14 DIAGNOSIS — I10 Essential (primary) hypertension: Secondary | ICD-10-CM | POA: Diagnosis not present

## 2023-04-14 LAB — CBC WITH DIFFERENTIAL (CANCER CENTER ONLY)
Abs Immature Granulocytes: 0.04 10*3/uL (ref 0.00–0.07)
Basophils Absolute: 0 10*3/uL (ref 0.0–0.1)
Basophils Relative: 1 %
Eosinophils Absolute: 0.1 10*3/uL (ref 0.0–0.5)
Eosinophils Relative: 2 %
HCT: 33.1 % — ABNORMAL LOW (ref 39.0–52.0)
Hemoglobin: 11.7 g/dL — ABNORMAL LOW (ref 13.0–17.0)
Immature Granulocytes: 1 %
Lymphocytes Relative: 24 %
Lymphs Abs: 1.1 10*3/uL (ref 0.7–4.0)
MCH: 40.6 pg — ABNORMAL HIGH (ref 26.0–34.0)
MCHC: 35.3 g/dL (ref 30.0–36.0)
MCV: 114.9 fL — ABNORMAL HIGH (ref 80.0–100.0)
Monocytes Absolute: 0.4 10*3/uL (ref 0.1–1.0)
Monocytes Relative: 9 %
Neutro Abs: 3.1 10*3/uL (ref 1.7–7.7)
Neutrophils Relative %: 63 %
Platelet Count: 143 10*3/uL — ABNORMAL LOW (ref 150–400)
RBC: 2.88 MIL/uL — ABNORMAL LOW (ref 4.22–5.81)
RDW: 14.1 % (ref 11.5–15.5)
WBC Count: 4.9 10*3/uL (ref 4.0–10.5)
nRBC: 0 % (ref 0.0–0.2)

## 2023-04-14 LAB — CMP (CANCER CENTER ONLY)
ALT: 57 U/L — ABNORMAL HIGH (ref 0–44)
AST: 51 U/L — ABNORMAL HIGH (ref 15–41)
Albumin: 4.2 g/dL (ref 3.5–5.0)
Alkaline Phosphatase: 49 U/L (ref 38–126)
Anion gap: 9 (ref 5–15)
BUN: 13 mg/dL (ref 6–20)
CO2: 26 mmol/L (ref 22–32)
Calcium: 9 mg/dL (ref 8.9–10.3)
Chloride: 100 mmol/L (ref 98–111)
Creatinine: 1.17 mg/dL (ref 0.61–1.24)
GFR, Estimated: >60 mL/min (ref >60)
Glucose, Bld: 98 mg/dL (ref 70–99)
Potassium: 3.7 mmol/L (ref 3.5–5.1)
Sodium: 135 mmol/L (ref 135–145)
Total Bilirubin: 3 mg/dL — ABNORMAL HIGH (ref 0.3–1.2)
Total Protein: 6.7 g/dL (ref 6.5–8.1)

## 2023-04-14 LAB — LACTATE DEHYDROGENASE: LDH: 187 U/L (ref 98–192)

## 2023-04-14 MED ORDER — SODIUM CHLORIDE 0.9 % IV SOLN: Freq: Once | INTRAVENOUS | Status: AC

## 2023-04-14 MED ORDER — SODIUM CHLORIDE 0.9 % IV SOLN
3300.0000 mg | Freq: Once | INTRAVENOUS | Status: AC
  Administered 2023-04-14: 3300 mg via INTRAVENOUS
  Filled 2023-04-14: qty 33

## 2023-04-14 NOTE — Patient Instructions (Signed)
[place] [facility]  Discharge Instructions: Thank you for choosing [facility] to provide your oncology and hematology care.   If you have a lab appointment with the Cancer Center, please go directly to the Cancer Center and check in at the registration area.   Wear comfortable clothing and clothing appropriate for easy access to any Portacath or PICC line.   We strive to give you quality time with your provider. You may need to reschedule your appointment if you arrive late (15 or more minutes).  Arriving late affects you and other patients whose appointments are after yours.  Also, if you miss three or more appointments without notifying the office, you may be dismissed from the clinic at the provider's discretion.      For prescription refill requests, have your pharmacy contact our office and allow 72 hours for refills to be completed.    Today you received the following chemotherapy and/or immunotherapy agents: Ultomiris.      To help prevent nausea and vomiting after your treatment, we encourage you to take your nausea medication as directed.  BELOW ARE SYMPTOMS THAT SHOULD BE REPORTED IMMEDIATELY: *FEVER GREATER THAN 100.4 F (38 C) OR HIGHER *CHILLS OR SWEATING *NAUSEA AND VOMITING THAT IS NOT CONTROLLED WITH YOUR NAUSEA MEDICATION *UNUSUAL SHORTNESS OF BREATH *UNUSUAL BRUISING OR BLEEDING *URINARY PROBLEMS (pain or burning when urinating, or frequent urination) *BOWEL PROBLEMS (unusual diarrhea, constipation, pain near the anus) TENDERNESS IN MOUTH AND THROAT WITH OR WITHOUT PRESENCE OF ULCERS (sore throat, sores in mouth, or a toothache) UNUSUAL RASH, SWELLING OR PAIN  UNUSUAL VAGINAL DISCHARGE OR ITCHING   Items with * indicate a potential emergency and should be followed up as soon as possible or go to the Emergency Department if any problems should occur.  Please show the CHEMOTHERAPY ALERT CARD or IMMUNOTHERAPY ALERT CARD at  check-in to the Emergency Department and triage nurse.  Should you have questions after your visit or need to cancel or reschedule your appointment, please contact [place] [facility]  Dept: [phone]  and follow the prompts.  Office hours are 8:00 a.m. to 4:30 p.m. Monday - Friday. Please note that voicemails left after 4:00 p.m. may not be returned until the following business day.  We are closed weekends and major holidays. You have access to a nurse at all times for urgent questions. Please call the main number to the clinic Dept: [phone] and follow the prompts.   For any non-urgent questions, you may also contact your provider using MyChart. We now offer e-Visits for anyone 36 and older to request care online for non-urgent symptoms. For details visit [email or web address].   Also download the MyChart app! Go to the app store, search "MyChart", open the app, select [place], and log in with your MyChart username and password.

## 2023-04-14 NOTE — Progress Notes (Signed)
[facility] [phone]   [phone]  OFFICE PROGRESS NOTE  Travis Emery, MD [address] 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  PRIOR THERAPY:  Soliris (Eculizumab) initially with induction 600 MG IV weekly for 4 weeks and then switched to maintenance treatment 900 MG IV every 2 weeks. First dose of treatment was given on 11/21/2014. Status post 45 cycles.  After cycle #45 the patient received his treatment at outside infusion center.  Status post 16 more cycles of treatment.  This was discontinued secondary to worsening symptoms and lack of control of his disease.  CURRENT THERAPY: Ultomiris (Ravulizumab) loading dose 2700 mg/M2 followed by maintenance dose 3300 mg/M2 every 8 weeks.  First dose of treatment September 22, 2018.  He status post the induction dose of the treatment as well as 24 cycles of maintenance therapy.  The first 3 cycles of his treatment were given at the infusion center in [facility].  Starting from cycle #18 the patient will receive his treatment at the [facility] health cancer Center.  INTERVAL HISTORY: Travis Mcdaniel 53 y.o. male returns to the clinic today for follow-up visit.Discussed the use of AI scribe software for clinical note transcription with the patient, who gave verbal consent to proceed.  History of Present Illness   Travis Mcdaniel, a 52 year old patient with a known diagnosis of paroxysmal nocturnal hemoglobinuria (PNH), has been on Ultomiris treatment for 27 cycles, administered every eight weeks. The patient reports feeling generally well, with no significant complaints related to the PNH.  However, the patient has been experiencing a decrease in energy levels and appetite, with unintentional weight loss. The patient reports a previous weight of 179 lbs, but current weight is 171.3 lbs. The patient  denies any other new symptoms or complaints.  The patient is also on a daily regimen of 5mg  prednisone, with no reported side effects. The patient has a history of palpitations, which have resolved. The patient is also on Lisinopril with Hydrochlorothiazide for blood pressure management and Eliquis 2.5mg  twice daily for DVT prophylaxis.  The patient has been inquiring about potential extravascular hemolysis, as suggested by a drug representative, and is interested in potential additional treatments to pair with Ultomiris.       MEDICAL HISTORY: Past Medical History:  Diagnosis Date   Cancer (HCC)    PNH (Bone marrow)   Diverticulosis    Encounter for antineoplastic chemotherapy 01/15/2016   Hypertension     ALLERGIES:  is allergic to nsaids.  MEDICATIONS:  Current Outpatient Medications  Medication Sig Dispense Refill   ALPRAZolam (XANAX) 1 MG tablet Take 1 mg by mouth 3 (three) times daily.  2   ELIQUIS 2.5 MG TABS tablet TAKE 1 TABLET BY MOUTH TWICE A DAY 60 tablet 2   ferrous sulfate 325 (65 FE) MG EC tablet Take 325 mg by mouth daily.     folic acid (FOLVITE) 1 MG tablet Take 1 mg by mouth daily.     lisinopril-hydrochlorothiazide (PRINZIDE,ZESTORETIC) 20-25 MG tablet Take 1 tablet by mouth daily.     Multiple Vitamins-Minerals (MULTIVITAMIN ADULT PO) Take by mouth.     Potassium 99 MG TABS Take 1 tablet by mouth daily.     predniSONE (DELTASONE) 5 MG tablet Take 1 tablet (5 mg total) by mouth daily with breakfast. 30 tablet 0   Psyllium (METAMUCIL PO) Take 1 capsule by mouth daily.  tetrahydrozoline 0.05 % ophthalmic solution Place 1 drop into both eyes 2 (two) times daily as needed (eye irritation).     No current facility-administered medications for this visit.    SURGICAL HISTORY: No past surgical history on file.  REVIEW OF SYSTEMS:  A comprehensive review of systems was negative except for: Constitutional: positive for fatigue Musculoskeletal: positive for  arthralgias   PHYSICAL EXAMINATION: General appearance: alert, cooperative, fatigued, icteric, and no distress Head: Normocephalic, without obvious abnormality, atraumatic Neck: no adenopathy, no JVD, supple, symmetrical, trachea midline, and thyroid not enlarged, symmetric, no tenderness/mass/nodules Lymph nodes: Cervical, supraclavicular, and axillary nodes normal. Resp: clear to auscultation bilaterally Back: symmetric, no curvature. ROM normal. No CVA tenderness. Cardio: regular rate and rhythm, S1, S2 normal, no murmur, click, rub or gallop GI: soft, non-tender; bowel sounds normal; no masses,  no organomegaly Extremities: extremities normal, atraumatic, no cyanosis or edema  ECOG PERFORMANCE STATUS: 1 - Symptomatic but completely ambulatory  Blood pressure 111/60, pulse 79, temperature 98.4 F (36.9 C), temperature source Oral, resp. rate 17, height 5\' 9"  (1.753 m), weight 171 lb 4.8 oz (77.7 kg), SpO2 100%.  LABORATORY DATA: Lab Results  Component Value Date   WBC 4.9 04/14/2023   HGB 11.7 (L) 04/14/2023   HCT 33.1 (L) 04/14/2023   MCV 114.9 (H) 04/14/2023   PLT 143 (L) 04/14/2023      Chemistry      Component Value Date/Time   NA 137 03/18/2023 0730   NA 136 06/28/2017 1236   K 3.9 03/18/2023 0730   K 4.1 06/28/2017 1236   CL 101 03/18/2023 0730   CO2 28 03/18/2023 0730   CO2 24 06/28/2017 1236   BUN 16 03/18/2023 0730   BUN 17.1 06/28/2017 1236   CREATININE 0.97 03/18/2023 0730   CREATININE 1.0 06/28/2017 1236      Component Value Date/Time   CALCIUM 9.1 03/18/2023 0730   CALCIUM 9.1 06/28/2017 1236   ALKPHOS 53 03/18/2023 0730   ALKPHOS 66 06/28/2017 1236   AST 48 (H) 03/18/2023 0730   AST 40 (H) 06/28/2017 1236   ALT 65 (H) 03/18/2023 0730   ALT 60 (H) 06/28/2017 1236   BILITOT 2.9 (H) 03/18/2023 0730   BILITOT 2.87 (H) 06/28/2017 1236       RADIOGRAPHIC STUDIES: No results found.  ASSESSMENT AND PLAN:  This is a very pleasant 52 years old white  male with paroxysmal nocturnal hemoglobinuria and currently on treatment with Soliris on days 1 and 15 every 4 weeks status post 45 cycles.  He is also on the same treatment received at outside infusion center status post 19 more cycles. He also completed a course of tapered dose of prednisone. He is currently on treatment with Ultomiris status post induction dose followed by 27  cycles of maintenance therapy.  He was receiving his treatment at the [facility] infusion center but because of significant lack of payment they declined to give him the infusions there anymore.  Starting from cycle #18 he received his treatment at the [facility] health cancer Center in [place]. The patient has been tolerating his treatment was Ultomiris fairly well.    Paroxysmal Nocturnal Hemoglobinuria (PNH) Stable on Ultomiris with good blood counts (Hb 11.7, Platelets 143,000). Pending LDH and bilirubin results. -Continue Ultomiris. -Check LDH and bilirubin when results are available.  Prednisone use On 5mg  daily without significant side effects. -Continue Prednisone 5mg  daily. -Provide additional refills.  Deep Vein Thrombosis (DVT) prophylaxis On Eliquis 2.5mg  twice daily. -Continue  Eliquis 2.5mg  twice daily.  Hypertension Blood pressure controlled at 111/60 on Lisinopril with Hydrochlorothiazide. -Continue Lisinopril with Hydrochlorothiazide.  Weight loss and decreased appetite Minimal weight loss noted (1.7 lbs). -Monitor weight and appetite.  Follow-up in 2 months.   The patient was advised to call immediately if he has any concerning symptoms in the interval. The patient voices understanding of current disease status and treatment options and is in agreement with the current care plan. All questions were answered. The patient knows to call the clinic with any problems, questions or concerns. We can certainly see the patient much sooner if necessary.  Disclaimer: This note was dictated with voice  recognition software. Similar sounding words can inadvertently be transcribed and may not be corrected upon review.

## 2023-04-16 ENCOUNTER — Other Ambulatory Visit: Payer: Self-pay | Admitting: Physician Assistant

## 2023-04-16 ENCOUNTER — Other Ambulatory Visit: Payer: Self-pay

## 2023-04-16 DIAGNOSIS — D595 Paroxysmal nocturnal hemoglobinuria [Marchiafava-Micheli]: Secondary | ICD-10-CM

## 2023-05-17 ENCOUNTER — Other Ambulatory Visit: Payer: Self-pay | Admitting: Medical Oncology

## 2023-05-17 DIAGNOSIS — D595 Paroxysmal nocturnal hemoglobinuria [Marchiafava-Micheli]: Secondary | ICD-10-CM

## 2023-05-17 MED ORDER — PREDNISONE 5 MG PO TABS: 5.0000 mg | ORAL_TABLET | Freq: Every day | ORAL | 0 refills | Status: DC

## 2023-06-09 ENCOUNTER — Other Ambulatory Visit: Payer: Self-pay

## 2023-06-09 ENCOUNTER — Inpatient Hospital Stay: Payer: BC Managed Care – PPO | Attending: Internal Medicine

## 2023-06-09 ENCOUNTER — Inpatient Hospital Stay: Payer: BC Managed Care – PPO

## 2023-06-09 ENCOUNTER — Inpatient Hospital Stay (HOSPITAL_BASED_OUTPATIENT_CLINIC_OR_DEPARTMENT_OTHER): Payer: BC Managed Care – PPO | Admitting: Internal Medicine

## 2023-06-09 VITALS — BP 108/69 | HR 85 | Resp 15

## 2023-06-09 VITALS — BP 125/68 | HR 85 | Temp 98.3°F | Resp 17 | Ht 69.0 in | Wt 173.8 lb

## 2023-06-09 DIAGNOSIS — M170 Bilateral primary osteoarthritis of knee: Secondary | ICD-10-CM | POA: Diagnosis not present

## 2023-06-09 DIAGNOSIS — D595 Paroxysmal nocturnal hemoglobinuria [Marchiafava-Micheli]: Secondary | ICD-10-CM

## 2023-06-09 DIAGNOSIS — Z7952 Long term (current) use of systemic steroids: Secondary | ICD-10-CM | POA: Diagnosis not present

## 2023-06-09 LAB — CMP (CANCER CENTER ONLY)
ALT: 134 U/L — ABNORMAL HIGH (ref 0–44)
AST: 100 U/L — ABNORMAL HIGH (ref 15–41)
Albumin: 4.6 g/dL (ref 3.5–5.0)
Alkaline Phosphatase: 59 U/L (ref 38–126)
Anion gap: 11 (ref 5–15)
BUN: 11 mg/dL (ref 6–20)
CO2: 24 mmol/L (ref 22–32)
Calcium: 9.6 mg/dL (ref 8.9–10.3)
Chloride: 100 mmol/L (ref 98–111)
Creatinine: 0.93 mg/dL (ref 0.61–1.24)
GFR, Estimated: >60 mL/min (ref >60)
Glucose, Bld: 103 mg/dL — ABNORMAL HIGH (ref 70–99)
Potassium: 4 mmol/L (ref 3.5–5.1)
Sodium: 135 mmol/L (ref 135–145)
Total Bilirubin: 3.3 mg/dL — ABNORMAL HIGH (ref <1.2)
Total Protein: 7.2 g/dL (ref 6.5–8.1)

## 2023-06-09 LAB — CBC WITH DIFFERENTIAL (CANCER CENTER ONLY)
Abs Immature Granulocytes: 0.04 10*3/uL (ref 0.00–0.07)
Basophils Absolute: 0 10*3/uL (ref 0.0–0.1)
Basophils Relative: 1 %
Eosinophils Absolute: 0.1 10*3/uL (ref 0.0–0.5)
Eosinophils Relative: 1 %
HCT: 35 % — ABNORMAL LOW (ref 39.0–52.0)
Hemoglobin: 12.7 g/dL — ABNORMAL LOW (ref 13.0–17.0)
Immature Granulocytes: 1 %
Lymphocytes Relative: 10 %
Lymphs Abs: 0.7 10*3/uL (ref 0.7–4.0)
MCH: 41.4 pg — ABNORMAL HIGH (ref 26.0–34.0)
MCHC: 36.3 g/dL — ABNORMAL HIGH (ref 30.0–36.0)
MCV: 114 fL — ABNORMAL HIGH (ref 80.0–100.0)
Monocytes Absolute: 0.6 10*3/uL (ref 0.1–1.0)
Monocytes Relative: 8 %
Neutro Abs: 5.7 10*3/uL (ref 1.7–7.7)
Neutrophils Relative %: 79 %
Platelet Count: 145 10*3/uL — ABNORMAL LOW (ref 150–400)
RBC: 3.07 MIL/uL — ABNORMAL LOW (ref 4.22–5.81)
RDW: 15.1 % (ref 11.5–15.5)
WBC Count: 7.1 10*3/uL (ref 4.0–10.5)
nRBC: 0.3 % — ABNORMAL HIGH (ref 0.0–0.2)

## 2023-06-09 LAB — LACTATE DEHYDROGENASE: LDH: 234 U/L — ABNORMAL HIGH (ref 98–192)

## 2023-06-09 MED ORDER — RAVULIZUMAB-CWVZ 300 MG/3ML IV SOLN
3300.0000 mg | Freq: Once | INTRAVENOUS | Status: AC
  Administered 2023-06-09: 3300 mg via INTRAVENOUS
  Filled 2023-06-09: qty 33

## 2023-06-09 MED ORDER — SODIUM CHLORIDE 0.9 % IV SOLN: Freq: Once | INTRAVENOUS | Status: AC

## 2023-06-09 MED ORDER — PREDNISONE 5 MG PO TABS: 5.0000 mg | ORAL_TABLET | Freq: Every day | ORAL | 0 refills | Status: DC

## 2023-06-09 NOTE — Patient Instructions (Signed)
[facility] A DEPT OF [facility]  Discharge Instructions: Thank you for choosing [facility] to provide your oncology and hematology care.   If you have a lab appointment with the Cancer Center, please go directly to the Cancer Center and check in at the registration area.   Wear comfortable clothing and clothing appropriate for easy access to any Portacath or PICC line.   We strive to give you quality time with your provider. You may need to reschedule your appointment if you arrive late (15 or more minutes).  Arriving late affects you and other patients whose appointments are after yours.  Also, if you miss three or more appointments without notifying the office, you may be dismissed from the clinic at the provider's discretion.      For prescription refill requests, have your pharmacy contact our office and allow 72 hours for refills to be completed.    Today you received the following chemotherapy and/or immunotherapy agents: Ultomiris      To help prevent nausea and vomiting after your treatment, we encourage you to take your nausea medication as directed.  BELOW ARE SYMPTOMS THAT SHOULD BE REPORTED IMMEDIATELY: *FEVER GREATER THAN 100.4 F (38 C) OR HIGHER *CHILLS OR SWEATING *NAUSEA AND VOMITING THAT IS NOT CONTROLLED WITH YOUR NAUSEA MEDICATION *UNUSUAL SHORTNESS OF BREATH *UNUSUAL BRUISING OR BLEEDING *URINARY PROBLEMS (pain or burning when urinating, or frequent urination) *BOWEL PROBLEMS (unusual diarrhea, constipation, pain near the anus) TENDERNESS IN MOUTH AND THROAT WITH OR WITHOUT PRESENCE OF ULCERS (sore throat, sores in mouth, or a toothache) UNUSUAL RASH, SWELLING OR PAIN  UNUSUAL VAGINAL DISCHARGE OR ITCHING   Items with * indicate a potential emergency and should be followed up as soon as possible or go to the Emergency Department if any problems should occur.  Please show the CHEMOTHERAPY ALERT CARD or IMMUNOTHERAPY  ALERT CARD at check-in to the Emergency Department and triage nurse.  Should you have questions after your visit or need to cancel or reschedule your appointment, please contact [facility]PT [facility]  Dept: [phone]  and follow the prompts.  Office hours are 8:00 a.m. to 4:30 p.m. Monday - Friday. Please note that voicemails left after 4:00 p.m. may not be returned until the following business day.  We are closed weekends and major holidays. You have access to a nurse at all times for urgent questions. Please call the main number to the clinic Dept: [phone] and follow the prompts.   For any non-urgent questions, you may also contact your provider using MyChart. We now offer e-Visits for anyone 72 and older to request care online for non-urgent symptoms. For details visit [email or web address].   Also download the MyChart app! Go to the app store, search "MyChart", open the app, select [place], and log in with your MyChart username and password.

## 2023-06-09 NOTE — Progress Notes (Signed)
[facility] [phone]) [phone]   Fax:([phone]  OFFICE PROGRESS NOTE  [name], MD [address] 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  PRIOR THERAPY:  Soliris (Eculizumab) initially with induction 600 MG IV weekly for 4 weeks and then switched to maintenance treatment 900 MG IV every 2 weeks. First dose of treatment was given on 11/21/2014. Status post 45 cycles.  After cycle #45 the patient received his treatment at outside infusion center.  Status post 16 more cycles of treatment.  This was discontinued secondary to worsening symptoms and lack of control of his disease.  CURRENT THERAPY: Ultomiris (Ravulizumab) loading dose 2700 mg/M2 followed by maintenance dose 3300 mg/M2 every 8 weeks.  First dose of treatment September 22, 2018.  He status post the induction dose of the treatment as well as 24 cycles of maintenance therapy.  The first 3 cycles of his treatment were given at the infusion center in [facility].  Starting from cycle #18 the patient will receive his treatment at the [facility] health cancer Center.  INTERVAL HISTORY: Travis Mcdaniel 52 y.o. male returns to the clinic today for follow-up visit.  Discussed the use of AI scribe software for clinical note transcription with the patient, who gave verbal consent to proceed.  History of Present Illness   The patient, a 52 year old with a history of Paroxysmal Nocturnal Hemoglobinuria (PNH), has been on treatment with Soliris and later switched to Ultomiris. He has been receiving Ultomiris for the past 25 cycles, administered every 8 weeks. Recently, the patient has been experiencing fatigue and worsening arthritis, which has been impacting his ability to find employment. He has not reported any new bleeding or bruising. The patient has also noticed a decrease in appetite, but no significant  weight loss has been observed.  The patient has been on a daily dose of 5mg  prednisone, which he reports has been beneficial for his arthritis. He has a history of multiple fractures in both ankles, which continue to cause pain, particularly with changes in weather. The patient's recent blood counts show a hemoglobin level of 12.7, an improvement from 11.7 in October, and platelets at 145, up from 143. The patient's white blood cell count is within normal limits. Other lab results, including kidney and liver function, are pending.  The patient's main concerns are fatigue, arthralgia, and the struggle to find part-time employment due to his disability. He is currently awaiting a trial for disability approval. The patient's financial situation is precarious, with difficulties affording basic utilities and concerns about potential homelessness.        MEDICAL HISTORY: Past Medical History:  Diagnosis Date   Cancer (HCC)    PNH (Bone marrow)   Diverticulosis    Encounter for antineoplastic chemotherapy 01/15/2016   Hypertension     ALLERGIES:  is allergic to nsaids.  MEDICATIONS:  Current Outpatient Medications  Medication Sig Dispense Refill   ALPRAZolam (XANAX) 1 MG tablet Take 1 mg by mouth 3 (three) times daily.  2   ELIQUIS 2.5 MG TABS tablet TAKE 1 TABLET BY MOUTH TWICE A DAY 60 tablet 2   ferrous sulfate 325 (65 FE) MG EC tablet Take 325 mg by mouth daily.     folic acid (FOLVITE) 1 MG tablet Take 1 mg by mouth daily.     lisinopril-hydrochlorothiazide (PRINZIDE,ZESTORETIC) 20-25 MG tablet Take 1 tablet by mouth daily.  Multiple Vitamins-Minerals (MULTIVITAMIN ADULT PO) Take by mouth.     Potassium 99 MG TABS Take 1 tablet by mouth daily.     predniSONE (DELTASONE) 5 MG tablet Take 1 tablet (5 mg total) by mouth daily with breakfast. 30 tablet 0   Psyllium (METAMUCIL PO) Take 1 capsule by mouth daily.     tetrahydrozoline 0.05 % ophthalmic solution Place 1 drop into both eyes 2  (two) times daily as needed (eye irritation).     No current facility-administered medications for this visit.    SURGICAL HISTORY: No past surgical history on file.  REVIEW OF SYSTEMS:  A comprehensive review of systems was negative except for: Constitutional: positive for fatigue Musculoskeletal: positive for arthralgias   PHYSICAL EXAMINATION: General appearance: alert, cooperative, fatigued, icteric, and no distress Head: Normocephalic, without obvious abnormality, atraumatic Neck: no adenopathy, no JVD, supple, symmetrical, trachea midline, and thyroid not enlarged, symmetric, no tenderness/mass/nodules Lymph nodes: Cervical, supraclavicular, and axillary nodes normal. Resp: clear to auscultation bilaterally Back: symmetric, no curvature. ROM normal. No CVA tenderness. Cardio: regular rate and rhythm, S1, S2 normal, no murmur, click, rub or gallop GI: soft, non-tender; bowel sounds normal; no masses,  no organomegaly Extremities: extremities normal, atraumatic, no cyanosis or edema  ECOG PERFORMANCE STATUS: 1 - Symptomatic but completely ambulatory  Blood pressure 125/68, pulse 85, temperature 98.3 F (36.8 C), temperature source Temporal, resp. rate 17, height 5\' 9"  (1.753 m), weight 173 lb 12.8 oz (78.8 kg), SpO2 100%.  LABORATORY DATA: Lab Results  Component Value Date   WBC 7.1 06/09/2023   HGB 12.7 (L) 06/09/2023   HCT 35.0 (L) 06/09/2023   MCV 114.0 (H) 06/09/2023   PLT 145 (L) 06/09/2023      Chemistry      Component Value Date/Time   NA 135 04/14/2023 0741   NA 136 06/28/2017 1236   K 3.7 04/14/2023 0741   K 4.1 06/28/2017 1236   CL 100 04/14/2023 0741   CO2 26 04/14/2023 0741   CO2 24 06/28/2017 1236   BUN 13 04/14/2023 0741   BUN 17.1 06/28/2017 1236   CREATININE 1.17 04/14/2023 0741   CREATININE 1.0 06/28/2017 1236      Component Value Date/Time   CALCIUM 9.0 04/14/2023 0741   CALCIUM 9.1 06/28/2017 1236   ALKPHOS 49 04/14/2023 0741   ALKPHOS 66  06/28/2017 1236   AST 51 (H) 04/14/2023 0741   AST 40 (H) 06/28/2017 1236   ALT 57 (H) 04/14/2023 0741   ALT 60 (H) 06/28/2017 1236   BILITOT 3.0 (H) 04/14/2023 0741   BILITOT 2.87 (H) 06/28/2017 1236       RADIOGRAPHIC STUDIES: No results found.  ASSESSMENT AND PLAN:  This is a very pleasant 52 years old white male with paroxysmal nocturnal hemoglobinuria and currently on treatment with Soliris on days 1 and 15 every 4 weeks status post 45 cycles.  He is also on the same treatment received at outside infusion center status post 19 more cycles. He also completed a course of tapered dose of prednisone. He is currently on treatment with Ultomiris status post induction dose followed by 28  cycles of maintenance therapy.  He was receiving his treatment at the [facility] infusion center but because of significant lack of payment they declined to give him the infusions there anymore.  Starting from cycle #18 he received his treatment at the [facility] health cancer Center in [place]. The patient has been tolerating his treatment was Ultomiris fairly well.  Paroxysmal Nocturnal Hemoglobinuria (PNH) Diagnosed in June 2016. Currently on Ultomiris infusions every 8 weeks. Hemoglobin at 12.7 g/dL, up from 81.1 g/dL in October. Platelets at 154 x10^9/L, up from 143 x10^9/L. White blood cell count within normal limits. Kidney and liver function tests, as well as LDH, pending. Discussed the importance of continuing Ultomiris for maintaining stability and preventing hemolysis. Patient prefers to continue current regimen. - Continue Ultomiris infusions every 8 weeks - Monitor kidney and liver function tests - Follow-up on LDH results  Fatigue Persistent fatigue, likely multifactorial including PNH and arthritis. No new bleeding, bruising, nausea, vomiting, or diarrhea. Slight loss of appetite but no significant weight loss. Discussed monitoring symptoms and reassessing at next visit. - Monitor symptoms  and reassess at next visit  Arthritis Worsening arthritis with pain in the back, knees, and ankles. Currently on 5 mg prednisone daily, which helps manage symptoms. Discussed the option of alternating prednisone every other day if symptoms allow. Patient prefers to continue current dose due to symptom relief. - Refill prednisone 5 mg daily for 8 weeks - Consider alternating prednisone every other day if symptoms allow  General Health Maintenance Discussed the importance of maintaining appointments and managing health despite financial difficulties. Patient expressed concerns about financial instability and its impact on health. - Ensure patient schedules next appointment before leaving - Provide support and resources for managing disability and financial stress  Follow-up - Schedule follow-up appointment in 8 weeks - Ensure patient receives Ultomiris infusion as scheduled.       The patient voices understanding of current disease status and treatment options and is in agreement with the current care plan. All questions were answered. The patient knows to call the clinic with any problems, questions or concerns. We can certainly see the patient much sooner if necessary.  Disclaimer: This note was dictated with voice recognition software. Similar sounding words can inadvertently be transcribed and may not be corrected upon review.

## 2023-07-01 ENCOUNTER — Encounter: Payer: Self-pay | Admitting: Internal Medicine

## 2023-07-15 ENCOUNTER — Telehealth: Payer: Self-pay | Admitting: Medical Oncology

## 2023-07-15 NOTE — Telephone Encounter (Signed)
Pt assistance eligibilty.

## 2023-07-20 ENCOUNTER — Encounter: Payer: Self-pay | Admitting: Internal Medicine

## 2023-07-28 ENCOUNTER — Other Ambulatory Visit: Payer: Self-pay | Admitting: Physician Assistant

## 2023-07-28 DIAGNOSIS — D595 Paroxysmal nocturnal hemoglobinuria [Marchiafava-Micheli]: Secondary | ICD-10-CM

## 2023-08-04 ENCOUNTER — Inpatient Hospital Stay: Payer: BC Managed Care – PPO | Attending: Internal Medicine

## 2023-08-04 ENCOUNTER — Inpatient Hospital Stay: Payer: BC Managed Care – PPO

## 2023-08-04 ENCOUNTER — Inpatient Hospital Stay: Payer: BC Managed Care – PPO | Admitting: Internal Medicine

## 2023-08-04 VITALS — BP 136/73 | HR 78 | Temp 97.9°F | Resp 20

## 2023-08-04 VITALS — BP 125/74 | HR 84 | Temp 98.1°F | Resp 17 | Ht 69.0 in | Wt 180.0 lb

## 2023-08-04 DIAGNOSIS — D595 Paroxysmal nocturnal hemoglobinuria [Marchiafava-Micheli]: Secondary | ICD-10-CM

## 2023-08-04 LAB — CBC WITH DIFFERENTIAL (CANCER CENTER ONLY)
Abs Immature Granulocytes: 0.04 10*3/uL (ref 0.00–0.07)
Basophils Absolute: 0.1 10*3/uL (ref 0.0–0.1)
Basophils Relative: 1 %
Eosinophils Absolute: 0.1 10*3/uL (ref 0.0–0.5)
Eosinophils Relative: 2 %
HCT: 34.9 % — ABNORMAL LOW (ref 39.0–52.0)
Hemoglobin: 12.8 g/dL — ABNORMAL LOW (ref 13.0–17.0)
Immature Granulocytes: 1 %
Lymphocytes Relative: 16 %
Lymphs Abs: 1 10*3/uL (ref 0.7–4.0)
MCH: 40.5 pg — ABNORMAL HIGH (ref 26.0–34.0)
MCHC: 36.7 g/dL — ABNORMAL HIGH (ref 30.0–36.0)
MCV: 110.4 fL — ABNORMAL HIGH (ref 80.0–100.0)
Monocytes Absolute: 0.6 10*3/uL (ref 0.1–1.0)
Monocytes Relative: 9 %
Neutro Abs: 4.4 10*3/uL (ref 1.7–7.7)
Neutrophils Relative %: 71 %
Platelet Count: 119 10*3/uL — ABNORMAL LOW (ref 150–400)
RBC: 3.16 MIL/uL — ABNORMAL LOW (ref 4.22–5.81)
RDW: 13.3 % (ref 11.5–15.5)
WBC Count: 6.1 10*3/uL (ref 4.0–10.5)
nRBC: 0 % (ref 0.0–0.2)

## 2023-08-04 LAB — CMP (CANCER CENTER ONLY)
ALT: 151 U/L — ABNORMAL HIGH (ref 0–44)
AST: 108 U/L — ABNORMAL HIGH (ref 15–41)
Albumin: 4.4 g/dL (ref 3.5–5.0)
Alkaline Phosphatase: 64 U/L (ref 38–126)
Anion gap: 7 (ref 5–15)
BUN: 16 mg/dL (ref 6–20)
CO2: 26 mmol/L (ref 22–32)
Calcium: 9.1 mg/dL (ref 8.9–10.3)
Chloride: 101 mmol/L (ref 98–111)
Creatinine: 0.93 mg/dL (ref 0.61–1.24)
GFR, Estimated: >60 mL/min (ref >60)
Glucose, Bld: 101 mg/dL — ABNORMAL HIGH (ref 70–99)
Potassium: 3.7 mmol/L (ref 3.5–5.1)
Sodium: 134 mmol/L — ABNORMAL LOW (ref 135–145)
Total Bilirubin: 3.3 mg/dL — ABNORMAL HIGH (ref 0.0–1.2)
Total Protein: 6.6 g/dL (ref 6.5–8.1)

## 2023-08-04 LAB — LACTATE DEHYDROGENASE: LDH: 211 U/L — ABNORMAL HIGH (ref 98–192)

## 2023-08-04 MED ORDER — SODIUM CHLORIDE 0.9 % IV SOLN: Freq: Once | INTRAVENOUS | Status: AC

## 2023-08-04 MED ORDER — PREDNISONE 5 MG PO TABS: 5.0000 mg | ORAL_TABLET | Freq: Every day | ORAL | 0 refills | Status: DC

## 2023-08-04 MED ORDER — SODIUM CHLORIDE 0.9 % IV SOLN
3300.0000 mg | Freq: Once | INTRAVENOUS | Status: AC
  Administered 2023-08-04: 3300 mg via INTRAVENOUS
  Filled 2023-08-04: qty 33

## 2023-08-04 NOTE — Progress Notes (Signed)
 Patient tolerated treatment well. Declined 1 hour observation.

## 2023-08-04 NOTE — Progress Notes (Signed)
 [facility] [phone]   [phone]  OFFICE PROGRESS NOTE  [name], MD [address] 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  PRIOR THERAPY:  Soliris  (Eculizumab ) initially with induction 600 MG IV weekly for 4 weeks and then switched to maintenance treatment 900 MG IV every 2 weeks. First dose of treatment was given on 11/21/2014. Status post 45 cycles.  After cycle #45 the patient received his treatment at outside infusion center.  Status post 16 more cycles of treatment.  This was discontinued secondary to worsening symptoms and lack of control of his disease.  CURRENT THERAPY: Ultomiris  (Ravulizumab ) loading dose 2700 mg/M2 followed by maintenance dose 3300 mg/M2 every 8 weeks.  First dose of treatment September 22, 2018.  He status post the induction dose of the treatment as well as 29 cycles of maintenance therapy.  The first 3 cycles of his treatment were given at the infusion center in [place] .  Starting from cycle #18 the patient will receive his treatment at the [facility].  INTERVAL HISTORY: Travis Mcdaniel 53 y.o. male returns to the clinic today for follow-up visit. Discussed the use of AI scribe software for clinical note transcription with the patient, who gave verbal consent to proceed.  History of Present Illness   Travis Mcdaniel is a 53 year old male with paroxysmal nocturnal hemoglobinuria who presents for routine follow-up and treatment.  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 Initially treated with Solaris, he was switched to Ultomiris  due to refractory results. He has completed 29 cycles of Ultomiris , administered every eight weeks, with today being his third cycle. Over the past eight weeks, he has stayed indoors due to bad weather, going out  only for groceries. No bleeding issues such as epistaxis or gum bleeding. No nausea, vomiting, or diarrhea. Recent blood work shows stable hemoglobin at 12.8, with fluctuating platelet counts currently at 119. He is currently taking 5 mg of prednisone  in addition to Ultomiris .  He notes a weight gain of six pounds since December, attributing it to increased consumption of potatoes and a sedentary lifestyle. He is mindful of his diet, acknowledging a high intake of bacon and eggs, and wants to reduce carbohydrate consumption to aid in weight loss.  He reports experiencing back pain, which has been attributed to degenerative changes in his spine, as noted in a recent CT scan. 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  Family history is significant for congestive heart failure, with his father recently diagnosed, and his mother and uncle having had the condition. He is concerned about his heart health but reports no symptoms such as leg swelling, and his ankles remain 'skinny'.        MEDICAL HISTORY: Past Medical History:  Diagnosis Date   Cancer (HCC)    PNH (Bone marrow)   Diverticulosis    Encounter for antineoplastic chemotherapy 01/15/2016   Hypertension     ALLERGIES:  is allergic to nsaids.  MEDICATIONS:  Current Outpatient Medications  Medication Sig Dispense Refill   ALPRAZolam  (XANAX ) 1 MG tablet Take 1 mg by mouth 3 (three) times daily.  2   ELIQUIS  2.5 MG TABS tablet TAKE 1 TABLET BY MOUTH TWICE A DAY 60 tablet 2   ferrous sulfate  325 (65 FE) MG EC tablet Take  325 mg by mouth daily.     folic acid  (FOLVITE ) 1 MG tablet Take 1 mg by mouth daily.     lisinopril -hydrochlorothiazide  (PRINZIDE ,ZESTORETIC ) 20-25 MG tablet Take 1 tablet by mouth daily.     Multiple Vitamins-Minerals (MULTIVITAMIN ADULT PO) Take by mouth.     Potassium 99 MG TABS Take 1 tablet by mouth daily.     predniSONE  (DELTASONE ) 5 MG tablet Take 1 tablet (5  mg total) by mouth daily with breakfast. 60 tablet 0   Psyllium (METAMUCIL PO) Take 1 capsule by mouth daily.     tetrahydrozoline 0.05 % ophthalmic solution Place 1 drop into both eyes 2 (two) times daily as needed (eye irritation).     No current facility-administered medications for this visit.    SURGICAL HISTORY: No past surgical history on file.  REVIEW OF SYSTEMS:  A comprehensive review of systems was negative except for: Constitutional: positive for fatigue Musculoskeletal: positive for arthralgias   PHYSICAL EXAMINATION: General appearance: alert, cooperative, fatigued, icteric, and no distress Head: Normocephalic, without obvious abnormality, atraumatic Neck: no adenopathy, no JVD, supple, symmetrical, trachea midline, and thyroid not enlarged, symmetric, no tenderness/mass/nodules Lymph nodes: Cervical, supraclavicular, and axillary nodes normal. Resp: clear to auscultation bilaterally Back: symmetric, no curvature. ROM normal. No CVA tenderness. Cardio: regular rate and rhythm, S1, S2 normal, no murmur, click, rub or gallop GI: soft, non-tender; bowel sounds normal; no masses,  no organomegaly Extremities: extremities normal, atraumatic, no cyanosis or edema  ECOG PERFORMANCE STATUS: 1 - Symptomatic but completely ambulatory  Blood pressure 125/74, pulse 84, temperature 98.1 F (36.7 C), temperature source Temporal, resp. rate 17, height 5' 9 (1.753 m), weight 180 lb (81.6 kg), SpO2 100%.  LABORATORY DATA: Lab Results  Component Value Date   WBC 6.1 08/04/2023   HGB 12.8 (L) 08/04/2023   HCT 34.9 (L) 08/04/2023   MCV 110.4 (H) 08/04/2023   PLT 119 (L) 08/04/2023      Chemistry      Component Value Date/Time   NA 135 06/09/2023 0844   NA 136 06/28/2017 1236   K 4.0 06/09/2023 0844   K 4.1 06/28/2017 1236   CL 100 06/09/2023 0844   CO2 24 06/09/2023 0844   CO2 24 06/28/2017 1236   BUN 11 06/09/2023 0844   BUN 17.1 06/28/2017 1236   CREATININE 0.93  06/09/2023 0844   CREATININE 1.0 06/28/2017 1236      Component Value Date/Time   CALCIUM 9.6 06/09/2023 0844   CALCIUM 9.1 06/28/2017 1236   ALKPHOS 59 06/09/2023 0844   ALKPHOS 66 06/28/2017 1236   AST 100 (H) 06/09/2023 0844   AST 40 (H) 06/28/2017 1236   ALT 134 (H) 06/09/2023 0844   ALT 60 (H) 06/28/2017 1236   BILITOT 3.3 (H) 06/09/2023 0844   BILITOT 2.87 (H) 06/28/2017 1236       RADIOGRAPHIC STUDIES: No results found.  ASSESSMENT AND PLAN:  This is a very pleasant 53 years old white male with paroxysmal nocturnal hemoglobinuria and currently on treatment with Soliris  on days 1 and 15 every 4 weeks status post 45 cycles.  He is also on the same treatment received at outside infusion center status post 19 more cycles. He also completed a course of tapered dose of prednisone . He is currently on treatment with Ultomiris  status post induction dose followed by 29  cycles of maintenance therapy.  He was receiving his treatment at the [place] infusion center but because of significant lack of payment they  declined to give him the infusions there anymore.  Starting from cycle #18 he received his treatment at the [facility] cancer [facility] in [place]. Assessment and Plan    Paroxysmal Nocturnal Hemoglobinuria (PNH) Diagnosed in June 2016. Initially treated with Solaris, switched to Ultomiris  due to refractory results. Currently on the 29th cycle of Ultomiris , administered every 8 weeks. Hemoglobin at 12.8, WBC at 6.1, platelets at 119. Mild jaundice observed, likely related to bilirubin levels. Discussed continuation of 5 mg prednisone  with Ultomiris . Patient prefers to continue current regimen. - Administer Ultomiris  today - Continue Ultomiris  every 8 weeks - Continue 5 mg prednisone  - Monitor bilirubin levels  Degenerative Spine Disease Reports back pain. Previous CT scan reviewed with Dr. Claudean showed degenerative changes consistent with arthritis. Advised to avoid heavy  work and lose weight to alleviate symptoms. Patient prefers to avoid heavy physical activities and is considering alternative work options. - Avoid heavy physical activities - Encourage weight loss  General Health Maintenance Gained 6 pounds since December. Advised to reduce carbohydrate intake and increase consumption of healthy foods. No signs of congestive heart failure observed despite family history. Discussed dietary changes to support weight loss and overall health improvement. - Encourage weight loss through dietary changes - Reduce carbohydrate intake - Increase consumption of healthy foods, particularly greens  Follow-up - Schedule next Ultomiris  treatment in 8 weeks - Monitor for signs of congestive heart failure - Follow up with primary care physician as needed.   The patient was advised to call immediately if he has any concerning symptoms in the interval.  The patient voices understanding of current disease status and treatment options and is in agreement with the current care plan. All questions were answered. The patient knows to call the clinic with any problems, questions or concerns. We can certainly see the patient much sooner if necessary.  Disclaimer: This note was dictated with voice recognition software. Similar sounding words can inadvertently be transcribed and may not be corrected upon review.

## 2023-08-04 NOTE — Patient Instructions (Signed)
 [facility] - A DEPT OF [facility]  Discharge Instructions: Thank you for choosing [facility] to provide your oncology and hematology care.   If you have a lab appointment with the Cancer Center, please go directly to the Cancer Center and check in at the registration area.   Wear comfortable clothing and clothing appropriate for easy access to any Portacath or PICC line.   We strive to give you quality time with your provider. You may need to reschedule your appointment if you arrive late (15 or more minutes).  Arriving late affects you and other patients whose appointments are after yours.  Also, if you miss three or more appointments without notifying the office, you may be dismissed from the clinic at the provider's discretion.      For prescription refill requests, have your pharmacy contact our office and allow 72 hours for refills to be completed.    Today you received the following chemotherapy and/or immunotherapy agents: Ultomiris      To help prevent nausea and vomiting after your treatment, we encourage you to take your nausea medication as directed.  BELOW ARE SYMPTOMS THAT SHOULD BE REPORTED IMMEDIATELY: *FEVER GREATER THAN 100.4 F (38 C) OR HIGHER *CHILLS OR SWEATING *NAUSEA AND VOMITING THAT IS NOT CONTROLLED WITH YOUR NAUSEA MEDICATION *UNUSUAL SHORTNESS OF BREATH *UNUSUAL BRUISING OR BLEEDING *URINARY PROBLEMS (pain or burning when urinating, or frequent urination) *BOWEL PROBLEMS (unusual diarrhea, constipation, pain near the anus) TENDERNESS IN MOUTH AND THROAT WITH OR WITHOUT PRESENCE OF ULCERS (sore throat, sores in mouth, or a toothache) UNUSUAL RASH, SWELLING OR PAIN  UNUSUAL VAGINAL DISCHARGE OR ITCHING   Items with * indicate a potential emergency and should be followed up as soon as possible or go to the Emergency Department if any problems should occur.  Please show the CHEMOTHERAPY ALERT CARD or IMMUNOTHERAPY  ALERT CARD at check-in to the Emergency Department and triage nurse.  Should you have questions after your visit or need to cancel or reschedule your appointment, please contact [facility] - A DEPT OF [facility]  Dept: [phone]  and follow the prompts.  Office hours are 8:00 a.m. to 4:30 p.m. Monday - Friday. Please note that voicemails left after 4:00 p.m. may not be returned until the following business day.  We are closed weekends and major holidays. You have access to a nurse at all times for urgent questions. Please call the main number to the clinic Dept: [phone] and follow the prompts.   For any non-urgent questions, you may also contact your provider using MyChart. We now offer e-Visits for anyone 72 and older to request care online for non-urgent symptoms. For details visit [email or web address].   Also download the MyChart app! Go to the app store, search "MyChart", open the app, select [place], and log in with your MyChart username and password.

## 2023-08-20 ENCOUNTER — Telehealth: Payer: Self-pay | Admitting: Medical Oncology

## 2023-08-20 NOTE — Telephone Encounter (Signed)
Requests assistnace to pay for Eloquis. I gave pt the phone number to call for application.

## 2023-09-25 NOTE — Progress Notes (Unsigned)
 [facility] OFFICE PROGRESS NOTE  [name], MD [address] 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     PRIOR THERAPY: Soliris (Eculizumab) initially with induction 600 MG IV weekly for 4 weeks and then switched to maintenance treatment 900 MG IV every 2 weeks. First dose of treatment was given on 11/21/2014. Status post 45 cycles.  After cycle #45 the patient received his treatment at outside infusion center.  Status post 16 more cycles of treatment.  This was discontinued secondary to worsening symptoms and lack of control of his disease.   CURRENT THERAPY:  Ultomiris (Ravulizumab) loading dose 2700 mg/M2 followed by maintenance dose 3300 mg/M2 every 8 weeks.  First dose of treatment September 22, 2018.  He status post the induction dose of the treatment. The first 3 cycles of his treatment were given at the infusion center in [facility].  He has received 12 cycles at the  [facility].  Low-dose 5 mg prednisone p.o. daily ***  INTERVAL HISTORY: Travis Mcdaniel 53 y.o. male returns  to the clinic today for a follow-up visit.  The patient was last seen by Dr. Arbutus Ped on 08/04/23.  The patient is receiving all Demaris.  He tolerates this well.  Prednisone?  5 mg?  Since last being seen he is feeling ***.  Energy?  He denies any jaundice.  Denies any fever, chills, or night sweats.  Weight loss?  Fatty liver disease?  Sugar intake?  He denies any recent nausea.  He may get dyspnea on exertion if he goes up several steps.  He denies any cough, hemoptysis, or chest pain.  He denies any vomiting, diarrhea, or constipation, or abdominal pain.  He has some chronic back pain.  He is compliant with his folic acid and iron supplement.  He is on a blood thinner with Eliquis for DVT prophylaxis.  He is here today for evaluation repeat blood work before undergoing his  next cycle of treatment.  MEDICAL HISTORY: Past Medical History:  Diagnosis Date   Cancer (HCC)    PNH (Bone marrow)   Diverticulosis    Encounter for antineoplastic chemotherapy 01/15/2016   Hypertension     ALLERGIES:  is allergic to nsaids.  MEDICATIONS:  Current Outpatient Medications  Medication Sig Dispense Refill   ALPRAZolam (XANAX) 1 MG tablet Take 1 mg by mouth 3 (three) times daily.  2   ELIQUIS 2.5 MG TABS tablet TAKE 1 TABLET BY MOUTH TWICE A DAY 60 tablet 2   ferrous sulfate 325 (65 FE) MG EC tablet Take 325 mg by mouth daily.     folic acid (FOLVITE) 1 MG tablet Take 1 mg by mouth daily.     lisinopril-hydrochlorothiazide (PRINZIDE,ZESTORETIC) 20-25 MG tablet Take 1 tablet by mouth daily.     Multiple Vitamins-Minerals (MULTIVITAMIN ADULT PO) Take by mouth.     Potassium 99 MG TABS Take 1 tablet by mouth daily.     predniSONE (DELTASONE) 5 MG tablet Take 1 tablet (5 mg total) by mouth daily with breakfast. 60 tablet 0   Psyllium (METAMUCIL PO) Take 1 capsule by mouth daily.     tetrahydrozoline 0.05 % ophthalmic solution Place 1 drop into both eyes 2 (two) times daily as needed (eye irritation).     No current facility-administered medications for this visit.    SURGICAL HISTORY: No past surgical history on file.  REVIEW OF SYSTEMS:  Review of Systems  Constitutional: Negative for appetite change, chills, fatigue, fever and unexpected weight change.  HENT:   Negative for mouth sores, nosebleeds, sore throat and trouble swallowing.   Eyes: Negative for eye problems and icterus.  Respiratory: Negative for cough, hemoptysis, shortness of breath and wheezing.   Cardiovascular: Negative for chest pain and leg swelling.  Gastrointestinal: Negative for abdominal pain, constipation, diarrhea, nausea and vomiting.  Genitourinary: Negative for bladder incontinence, difficulty urinating, dysuria, frequency and hematuria.   Musculoskeletal: Negative for back pain, gait  problem, neck pain and neck stiffness.  Skin: Negative for itching and rash.  Neurological: Negative for dizziness, extremity weakness, gait problem, headaches, light-headedness and seizures.  Hematological: Negative for adenopathy. Does not bruise/bleed easily.  Psychiatric/Behavioral: Negative for confusion, depression and sleep disturbance. The patient is not nervous/anxious.     PHYSICAL EXAMINATION:  There were no vitals taken for this visit.  ECOG PERFORMANCE STATUS: {CHL ONC ECOG [id number]  Physical Exam  Constitutional: Oriented to person, place, and time and well-developed, well-nourished, and in no distress. No distress.  HENT:  Head: Normocephalic and atraumatic.  Mouth/Throat: Oropharynx is clear and moist. No oropharyngeal exudate.  Eyes: Conjunctivae are normal. Right eye exhibits no discharge. Left eye exhibits no discharge. No scleral icterus.  Neck: Normal range of motion. Neck supple.  Cardiovascular: Normal rate, regular rhythm, normal heart sounds and intact distal pulses.   Pulmonary/Chest: Effort normal and breath sounds normal. No respiratory distress. No wheezes. No rales.  Abdominal: Soft. Bowel sounds are normal. Exhibits no distension and no mass. There is no tenderness.  Musculoskeletal: Normal range of motion. Exhibits no edema.  Lymphadenopathy:    No cervical adenopathy.  Neurological: Alert and oriented to person, place, and time. Exhibits normal muscle tone. Gait normal. Coordination normal.  Skin: Skin is warm and dry. No rash noted. Not diaphoretic. No erythema. No pallor.  Psychiatric: Mood, memory and judgment normal.  Vitals reviewed.  LABORATORY DATA: Lab Results  Component Value Date   WBC 6.1 08/04/2023   HGB 12.8 (L) 08/04/2023   HCT 34.9 (L) 08/04/2023   MCV 110.4 (H) 08/04/2023   PLT 119 (L) 08/04/2023      Chemistry      Component Value Date/Time   NA 134 (L) 08/04/2023 0735   NA 136 06/28/2017 1236   K 3.7 08/04/2023 0735    K 4.1 06/28/2017 1236   CL 101 08/04/2023 0735   CO2 26 08/04/2023 0735   CO2 24 06/28/2017 1236   BUN 16 08/04/2023 0735   BUN 17.1 06/28/2017 1236   CREATININE 0.93 08/04/2023 0735   CREATININE 1.0 06/28/2017 1236      Component Value Date/Time   CALCIUM 9.1 08/04/2023 0735   CALCIUM 9.1 06/28/2017 1236   ALKPHOS 64 08/04/2023 0735   ALKPHOS 66 06/28/2017 1236   AST 108 (H) 08/04/2023 0735   AST 40 (H) 06/28/2017 1236   ALT 151 (H) 08/04/2023 0735   ALT 60 (H) 06/28/2017 1236   BILITOT 3.3 (H) 08/04/2023 0735   BILITOT 2.87 (H) 06/28/2017 1236       RADIOGRAPHIC STUDIES:  No results found.   ASSESSMENT/PLAN:  This is a very pleasant 53 year old Caucasian male with paroxysmal nocturnal hemoglobinuria.  He been on treatment with Solaris on days 1 and 15 every 4 weeks.  He is status post 45 cycles.  He received an additional 19 cycles an outside infusion center.  He previously completed a tapering dose of prednisone that was  started on September/October 2019 for an episode of hemolytic anemia. His last prednisone taper was in November 2021.    Due to convenience and his previous treatment with Solaris interfering with his work schedule/performance, he was interested in starting Ultomiris.  His insurance requires him to receive treatment from an outside infusion center due to the facility fee here at the cancer center.  His first dose was on May 6th, 2020. Status post induction dose followed by maintenance therapy.  He is currently receiving his infusion of the Ultomiris at the [facility] infusion center. tarting from cycle #18 he received his treatment at the [facility] in [place].    He continues to tolerate his treatment well with no concerning adverse effects     The patient was started on low-dose prednisone.  He is feeling well at this time.   He is here today for evaluation repeat blood work.  The patient was seen with Dr. Arbutus Ped.  His labs today  show *** anemia with a hemoglobin of ***.  His bilirubin is ***  His AST and ALT are slightly elevated *** and *** respectively.  The patient also does have fatty liver disease.  He will continue on the same treatment at the same dose.   Continue the patient on 5 mg of prednisone.  I recommended that he start taking Prilosec.***  He will receive his Ultomiris as scheduled     He will continue taking Eliquis for thrombosis prophylaxis.   The patient was advised to call immediately if he has any concerning symptoms in the interval. The patient voices understanding of current disease status and treatment options and is in agreement with the current care plan. All questions were answered. The patient knows to call the clinic with any problems, questions or concerns. We can certainly see the patient much sooner if necessary    No orders of the defined types were placed in this encounter.    I spent {CHL ONC TIME VISIT - ZOXWR:[id number]} counseling the patient face to face. The total time spent in the appointment was {CHL ONC TIME VISIT - BJYNW:[id number]}.  Tiffany Calmes L Neria Procter, [name] 09/25/23

## 2023-09-28 ENCOUNTER — Telehealth: Payer: Self-pay | Admitting: Medical Oncology

## 2023-09-28 NOTE — Telephone Encounter (Signed)
 Per Helmut Muster , the MD address is missing on section 2 of the application for Eloquis. Form refaxed with address on it.  I told her we do not have the patient portion of the application with his signature.Helmut Muster said the company will call pt for it .

## 2023-09-29 ENCOUNTER — Inpatient Hospital Stay: Payer: BC Managed Care – PPO | Attending: Internal Medicine

## 2023-09-29 ENCOUNTER — Inpatient Hospital Stay: Payer: BC Managed Care – PPO

## 2023-09-29 ENCOUNTER — Inpatient Hospital Stay: Payer: BC Managed Care – PPO | Admitting: Internal Medicine

## 2023-09-29 ENCOUNTER — Inpatient Hospital Stay (HOSPITAL_BASED_OUTPATIENT_CLINIC_OR_DEPARTMENT_OTHER): Admitting: Physician Assistant

## 2023-09-29 VITALS — HR 98

## 2023-09-29 VITALS — BP 127/76 | HR 106 | Temp 98.1°F | Resp 16 | Wt 178.8 lb

## 2023-09-29 DIAGNOSIS — Z23 Encounter for immunization: Secondary | ICD-10-CM | POA: Diagnosis not present

## 2023-09-29 DIAGNOSIS — D595 Paroxysmal nocturnal hemoglobinuria [Marchiafava-Micheli]: Secondary | ICD-10-CM

## 2023-09-29 DIAGNOSIS — Z7952 Long term (current) use of systemic steroids: Secondary | ICD-10-CM | POA: Diagnosis not present

## 2023-09-29 LAB — CBC WITH DIFFERENTIAL (CANCER CENTER ONLY)
Abs Immature Granulocytes: 0.02 10*3/uL (ref 0.00–0.07)
Basophils Absolute: 0 10*3/uL (ref 0.0–0.1)
Basophils Relative: 1 %
Eosinophils Absolute: 0.1 10*3/uL (ref 0.0–0.5)
Eosinophils Relative: 1 %
HCT: 39.4 % (ref 39.0–52.0)
Hemoglobin: 13.9 g/dL (ref 13.0–17.0)
Immature Granulocytes: 0 %
Lymphocytes Relative: 22 %
Lymphs Abs: 1.2 10*3/uL (ref 0.7–4.0)
MCH: 39.3 pg — ABNORMAL HIGH (ref 26.0–34.0)
MCHC: 35.3 g/dL (ref 30.0–36.0)
MCV: 111.3 fL — ABNORMAL HIGH (ref 80.0–100.0)
Monocytes Absolute: 0.5 10*3/uL (ref 0.1–1.0)
Monocytes Relative: 10 %
Neutro Abs: 3.4 10*3/uL (ref 1.7–7.7)
Neutrophils Relative %: 66 %
Platelet Count: 114 10*3/uL — ABNORMAL LOW (ref 150–400)
RBC: 3.54 MIL/uL — ABNORMAL LOW (ref 4.22–5.81)
RDW: 13.5 % (ref 11.5–15.5)
WBC Count: 5.2 10*3/uL (ref 4.0–10.5)
nRBC: 0 % (ref 0.0–0.2)

## 2023-09-29 LAB — CMP (CANCER CENTER ONLY)
ALT: 128 U/L — ABNORMAL HIGH (ref 0–44)
AST: 133 U/L — ABNORMAL HIGH (ref 15–41)
Albumin: 4.4 g/dL (ref 3.5–5.0)
Alkaline Phosphatase: 53 U/L (ref 38–126)
Anion gap: 8 (ref 5–15)
BUN: 14 mg/dL (ref 6–20)
CO2: 26 mmol/L (ref 22–32)
Calcium: 9.1 mg/dL (ref 8.9–10.3)
Chloride: 103 mmol/L (ref 98–111)
Creatinine: 0.89 mg/dL (ref 0.61–1.24)
GFR, Estimated: >60 mL/min (ref >60)
Glucose, Bld: 142 mg/dL — ABNORMAL HIGH (ref 70–99)
Potassium: 3.6 mmol/L (ref 3.5–5.1)
Sodium: 137 mmol/L (ref 135–145)
Total Bilirubin: 3.5 mg/dL — ABNORMAL HIGH (ref 0.0–1.2)
Total Protein: 6.8 g/dL (ref 6.5–8.1)

## 2023-09-29 LAB — LACTATE DEHYDROGENASE: LDH: 235 U/L — ABNORMAL HIGH (ref 98–192)

## 2023-09-29 MED ORDER — MENINGOCOCCAL VAC B (OMV) IM SUSY
0.5000 mL | PREFILLED_SYRINGE | Freq: Once | INTRAMUSCULAR | Status: AC
  Administered 2023-09-29: 0.5 mL via INTRAMUSCULAR
  Filled 2023-09-29: qty 0.5

## 2023-09-29 MED ORDER — MENINGOCOCCAL A C Y&W-135 OLIG IM SOLN
0.5000 mL | Freq: Once | INTRAMUSCULAR | Status: AC
  Administered 2023-09-29: 0.5 mL via INTRAMUSCULAR
  Filled 2023-09-29: qty 0.5

## 2023-09-29 MED ORDER — SODIUM CHLORIDE 0.9 % IV SOLN
3300.0000 mg | Freq: Once | INTRAVENOUS | Status: AC
  Administered 2023-09-29: 3300 mg via INTRAVENOUS
  Filled 2023-09-29: qty 33

## 2023-09-29 MED ORDER — SODIUM CHLORIDE 0.9 % IV SOLN: Freq: Once | INTRAVENOUS | Status: AC

## 2023-09-29 MED ORDER — PREDNISONE 5 MG PO TABS: 5.0000 mg | ORAL_TABLET | Freq: Every day | ORAL | 0 refills | Status: DC

## 2023-09-29 NOTE — Patient Instructions (Signed)
 [facility] - A DEPT OF [facility]  Discharge Instructions: Thank you for choosing [facility] to provide your oncology and hematology care.   If you have a lab appointment with the Cancer Center, please go directly to the Cancer Center and check in at the registration area.   Wear comfortable clothing and clothing appropriate for easy access to any Portacath or PICC line.   We strive to give you quality time with your provider. You may need to reschedule your appointment if you arrive late (15 or more minutes).  Arriving late affects you and other patients whose appointments are after yours.  Also, if you miss three or more appointments without notifying the office, you may be dismissed from the clinic at the provider's discretion.      For prescription refill requests, have your pharmacy contact our office and allow 72 hours for refills to be completed.    Today you received the following chemotherapy and/or immunotherapy agents: Ultomiris      To help prevent nausea and vomiting after your treatment, we encourage you to take your nausea medication as directed.  BELOW ARE SYMPTOMS THAT SHOULD BE REPORTED IMMEDIATELY: *FEVER GREATER THAN 100.4 F (38 C) OR HIGHER *CHILLS OR SWEATING *NAUSEA AND VOMITING THAT IS NOT CONTROLLED WITH YOUR NAUSEA MEDICATION *UNUSUAL SHORTNESS OF BREATH *UNUSUAL BRUISING OR BLEEDING *URINARY PROBLEMS (pain or burning when urinating, or frequent urination) *BOWEL PROBLEMS (unusual diarrhea, constipation, pain near the anus) TENDERNESS IN MOUTH AND THROAT WITH OR WITHOUT PRESENCE OF ULCERS (sore throat, sores in mouth, or a toothache) UNUSUAL RASH, SWELLING OR PAIN  UNUSUAL VAGINAL DISCHARGE OR ITCHING   Items with * indicate a potential emergency and should be followed up as soon as possible or go to the Emergency Department if any problems should occur.  Please show the CHEMOTHERAPY ALERT CARD or IMMUNOTHERAPY  ALERT CARD at check-in to the Emergency Department and triage nurse.  Should you have questions after your visit or need to cancel or reschedule your appointment, please contact [facility] - A DEPT OF [facility]  Dept: [phone]  and follow the prompts.  Office hours are 8:00 a.m. to 4:30 p.m. Monday - Friday. Please note that voicemails left after 4:00 p.m. may not be returned until the following business day.  We are closed weekends and major holidays. You have access to a nurse at all times for urgent questions. Please call the main number to the clinic Dept: [phone] and follow the prompts.   For any non-urgent questions, you may also contact your provider using MyChart. We now offer e-Visits for anyone 72 and older to request care online for non-urgent symptoms. For details visit [email or web address].   Also download the MyChart app! Go to the app store, search "MyChart", open the app, select [place], and log in with your MyChart username and password.

## 2023-09-30 ENCOUNTER — Other Ambulatory Visit: Payer: Self-pay | Admitting: Internal Medicine

## 2023-09-30 DIAGNOSIS — D595 Paroxysmal nocturnal hemoglobinuria [Marchiafava-Micheli]: Secondary | ICD-10-CM

## 2023-10-01 ENCOUNTER — Other Ambulatory Visit: Payer: Self-pay | Admitting: Internal Medicine

## 2023-10-01 ENCOUNTER — Telehealth: Payer: Self-pay

## 2023-10-01 NOTE — Telephone Encounter (Signed)
 Spoke with patient about refill on Prednisone 5 mg.  Informed patient that Cassie, PA sent to pharmacy on 09/29/2023.  Patient voiced understanding.

## 2023-10-06 ENCOUNTER — Telehealth: Payer: Self-pay | Admitting: *Deleted

## 2023-10-06 NOTE — Telephone Encounter (Signed)
 Patient left VM - he finally spoke with Chriss Czar about Eliquis. He said they told him that since this office had a pharmacy, he should ask if the office could provide samples of Eliquis while he is getting this sorted out. Contacted patient and left voice mail: Pharmacy at [facility] does not provide samples of medications. Encouraged him to contact office if he has other ideas or options open up.

## 2023-10-13 ENCOUNTER — Telehealth: Payer: Self-pay | Admitting: Medical Oncology

## 2023-10-13 NOTE — Telephone Encounter (Signed)
 Faxed  Eloquis financial assistance to [employer].

## 2023-11-25 ENCOUNTER — Inpatient Hospital Stay

## 2023-11-25 ENCOUNTER — Other Ambulatory Visit

## 2023-11-25 ENCOUNTER — Inpatient Hospital Stay: Attending: Internal Medicine

## 2023-11-25 ENCOUNTER — Other Ambulatory Visit: Payer: Self-pay | Admitting: Physician Assistant

## 2023-11-25 ENCOUNTER — Ambulatory Visit

## 2023-11-25 ENCOUNTER — Ambulatory Visit: Admitting: Internal Medicine

## 2023-11-25 ENCOUNTER — Inpatient Hospital Stay: Admitting: Internal Medicine

## 2023-11-25 VITALS — BP 118/64 | HR 78 | Temp 97.4°F | Resp 16 | Wt 179.4 lb

## 2023-11-25 DIAGNOSIS — R7402 Elevation of levels of lactic acid dehydrogenase (LDH): Secondary | ICD-10-CM | POA: Diagnosis not present

## 2023-11-25 DIAGNOSIS — D595 Paroxysmal nocturnal hemoglobinuria [Marchiafava-Micheli]: Secondary | ICD-10-CM

## 2023-11-25 DIAGNOSIS — E669 Obesity, unspecified: Secondary | ICD-10-CM | POA: Diagnosis not present

## 2023-11-25 DIAGNOSIS — F1090 Alcohol use, unspecified, uncomplicated: Secondary | ICD-10-CM | POA: Insufficient documentation

## 2023-11-25 LAB — CBC WITH DIFFERENTIAL (CANCER CENTER ONLY)
Abs Immature Granulocytes: 0.02 10*3/uL (ref 0.00–0.07)
Basophils Absolute: 0 10*3/uL (ref 0.0–0.1)
Basophils Relative: 1 %
Eosinophils Absolute: 0 10*3/uL (ref 0.0–0.5)
Eosinophils Relative: 1 %
HCT: 40 % (ref 39.0–52.0)
Hemoglobin: 14.2 g/dL (ref 13.0–17.0)
Immature Granulocytes: 0 %
Lymphocytes Relative: 23 %
Lymphs Abs: 1.3 10*3/uL (ref 0.7–4.0)
MCH: 39.6 pg — ABNORMAL HIGH (ref 26.0–34.0)
MCHC: 35.5 g/dL (ref 30.0–36.0)
MCV: 111.4 fL — ABNORMAL HIGH (ref 80.0–100.0)
Monocytes Absolute: 0.4 10*3/uL (ref 0.1–1.0)
Monocytes Relative: 8 %
Neutro Abs: 3.8 10*3/uL (ref 1.7–7.7)
Neutrophils Relative %: 67 %
Platelet Count: 112 10*3/uL — ABNORMAL LOW (ref 150–400)
RBC: 3.59 MIL/uL — ABNORMAL LOW (ref 4.22–5.81)
RDW: 12.9 % (ref 11.5–15.5)
WBC Count: 5.6 10*3/uL (ref 4.0–10.5)
nRBC: 0 % (ref 0.0–0.2)

## 2023-11-25 LAB — CMP (CANCER CENTER ONLY)
ALT: 119 U/L — ABNORMAL HIGH (ref 0–44)
AST: 85 U/L — ABNORMAL HIGH (ref 15–41)
Albumin: 4.4 g/dL (ref 3.5–5.0)
Alkaline Phosphatase: 50 U/L (ref 38–126)
Anion gap: 8 (ref 5–15)
BUN: 13 mg/dL (ref 6–20)
CO2: 25 mmol/L (ref 22–32)
Calcium: 9.1 mg/dL (ref 8.9–10.3)
Chloride: 101 mmol/L (ref 98–111)
Creatinine: 0.92 mg/dL (ref 0.61–1.24)
GFR, Estimated: >60 mL/min (ref >60)
Glucose, Bld: 136 mg/dL — ABNORMAL HIGH (ref 70–99)
Potassium: 3.4 mmol/L — ABNORMAL LOW (ref 3.5–5.1)
Sodium: 134 mmol/L — ABNORMAL LOW (ref 135–145)
Total Bilirubin: 2.9 mg/dL — ABNORMAL HIGH (ref 0.0–1.2)
Total Protein: 6.9 g/dL (ref 6.5–8.1)

## 2023-11-25 LAB — LACTATE DEHYDROGENASE: LDH: 178 U/L (ref 98–192)

## 2023-11-25 MED ORDER — PREDNISONE 5 MG PO TABS: 5.0000 mg | ORAL_TABLET | Freq: Every day | ORAL | 0 refills | Status: DC

## 2023-11-25 MED ORDER — SODIUM CHLORIDE 0.9 % IV SOLN
3300.0000 mg | Freq: Once | INTRAVENOUS | Status: AC
  Administered 2023-11-25: 3300 mg via INTRAVENOUS
  Filled 2023-11-25: qty 33

## 2023-11-25 MED ORDER — SODIUM CHLORIDE 0.9 % IV SOLN: Freq: Once | INTRAVENOUS | Status: AC

## 2023-11-25 NOTE — Progress Notes (Signed)
 [facility] [phone]   [phone]  OFFICE PROGRESS NOTE  Davida Espy, MD [address]. 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  PRIOR THERAPY:  Soliris  (Eculizumab ) initially with induction 600 MG IV weekly for 4 weeks and then switched to maintenance treatment 900 MG IV every 2 weeks. First dose of treatment was given on 11/21/2014. Status post 45 cycles.  After cycle #45 the patient received his treatment at outside infusion center.  Status post 16 more cycles of treatment.  This was discontinued secondary to worsening symptoms and lack of control of his disease.  CURRENT THERAPY: Ultomiris  (Ravulizumab ) loading dose 2700 mg/M2 followed by maintenance dose 3300 mg/M2 every 8 weeks.  First dose of treatment September 22, 2018.  He status post the induction dose of the treatment as well as 31  cycles of maintenance therapy.  The first 3 cycles of his treatment were given at the infusion center in [place] .  Starting from cycle #18 the patient will receive his treatment at the [facility] cancer Center.  INTERVAL HISTORY: Travis Mcdaniel 53 y.o. male returns to the clinic today for follow-up visit. Discussed the use of AI scribe software for clinical note transcription with the patient, who gave verbal consent to proceed.  History of Present Illness   Travis Mcdaniel is a 53 year old male with paroxysmal nocturnal hemoglobinuria who presents for evaluation before starting cycle number 32 of Ultomiris  (Ravulizumab )  treatment.  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 He has undergone several treatments in the past, including Solaris, and is currently on Ultomiris  every eight weeks, having completed 31 cycles. He feels good overall with no new complaints since his last visit.  No  bleeding, bruising, epistaxis, or gum bleeding, although he notes easy bruising. No swelling in his legs or arms.  His weight is stable, and he has been increasing his physical activity by walking more and attempting pushups, although he finds them challenging. He feels 'gassed out' and exhausted, particularly with pushups, and notes difficulty in performing as many as he used to.  He is currently taking 5 mg of prednisone . He is concerned about his weight, stating he is supposed to be 165 pounds but is currently 179 pounds. He wants to lose weight and has started to reduce his alcohol intake, particularly beer, which he had been consuming more frequently during the week. He mentions being on disability.       MEDICAL HISTORY: Past Medical History:  Diagnosis Date   Cancer (HCC)    PNH (Bone marrow)   Diverticulosis    Encounter for antineoplastic chemotherapy 01/15/2016   Hypertension     ALLERGIES:  is allergic to nsaids.  MEDICATIONS:  Current Outpatient Medications  Medication Sig Dispense Refill   ALPRAZolam  (XANAX ) 1 MG tablet Take 1 mg by mouth 3 (three) times daily.  2   ELIQUIS  2.5 MG TABS tablet TAKE 1 TABLET BY MOUTH TWICE A DAY 60 tablet 2   ferrous sulfate  325 (65 FE) MG EC tablet Take 325 mg by mouth daily.     folic acid  (FOLVITE ) 1 MG tablet Take 1 mg by mouth daily.     lisinopril -hydrochlorothiazide  (PRINZIDE ,ZESTORETIC ) 20-25 MG tablet Take 1 tablet by mouth daily.     Multiple Vitamins-Minerals (MULTIVITAMIN ADULT PO) Take by mouth.     Potassium  99 MG TABS Take 1 tablet by mouth daily.     predniSONE  (DELTASONE ) 10 MG tablet TAKE 1 TABLET (10 MG TOTAL) BY MOUTH DAILY WITH BREAKFAST. 15 tablet 0   predniSONE  (DELTASONE ) 5 MG tablet Take 1 tablet (5 mg total) by mouth daily with breakfast. 60 tablet 0   Psyllium (METAMUCIL PO) Take 1 capsule by mouth daily.     tetrahydrozoline 0.05 % ophthalmic solution Place 1 drop into both eyes 2 (two) times daily as needed (eye  irritation).     No current facility-administered medications for this visit.    SURGICAL HISTORY: No past surgical history on file.  REVIEW OF SYSTEMS:  Constitutional: negative Eyes: negative Ears, nose, mouth, throat, and face: negative Respiratory: negative Cardiovascular: negative Gastrointestinal: negative Genitourinary:negative Integument/breast: negative Hematologic/lymphatic: negative Musculoskeletal:negative Neurological: negative Behavioral/Psych: negative Endocrine: negative Allergic/Immunologic: negative   PHYSICAL EXAMINATION: General appearance: alert, cooperative, icteric, and no distress Head: Normocephalic, without obvious abnormality, atraumatic Neck: no adenopathy, no JVD, supple, symmetrical, trachea midline, and thyroid not enlarged, symmetric, no tenderness/mass/nodules Lymph nodes: Cervical, supraclavicular, and axillary nodes normal. Resp: clear to auscultation bilaterally Back: symmetric, no curvature. ROM normal. No CVA tenderness. Cardio: regular rate and rhythm, S1, S2 normal, no murmur, click, rub or gallop GI: soft, non-tender; bowel sounds normal; no masses,  no organomegaly Extremities: extremities normal, atraumatic, no cyanosis or edema Neurologic: Alert and oriented X 3, normal strength and tone. Normal symmetric reflexes. Normal coordination and gait  ECOG PERFORMANCE STATUS: 1 - Symptomatic but completely ambulatory  Blood pressure 118/64, pulse 78, temperature (!) 97.4 F (36.3 C), temperature source Temporal, resp. rate 16, weight 179 lb 6.4 oz (81.4 kg), SpO2 100%.  LABORATORY DATA: Lab Results  Component Value Date   WBC 5.2 09/29/2023   HGB 13.9 09/29/2023   HCT 39.4 09/29/2023   MCV 111.3 (H) 09/29/2023   PLT 114 (L) 09/29/2023      Chemistry      Component Value Date/Time   NA 137 09/29/2023 0750   NA 136 06/28/2017 1236   K 3.6 09/29/2023 0750   K 4.1 06/28/2017 1236   CL 103 09/29/2023 0750   CO2 26 09/29/2023 0750    CO2 24 06/28/2017 1236   BUN 14 09/29/2023 0750   BUN 17.1 06/28/2017 1236   CREATININE 0.89 09/29/2023 0750   CREATININE 1.0 06/28/2017 1236      Component Value Date/Time   CALCIUM 9.1 09/29/2023 0750   CALCIUM 9.1 06/28/2017 1236   ALKPHOS 53 09/29/2023 0750   ALKPHOS 66 06/28/2017 1236   AST 133 (H) 09/29/2023 0750   AST 40 (H) 06/28/2017 1236   ALT 128 (H) 09/29/2023 0750   ALT 60 (H) 06/28/2017 1236   BILITOT 3.5 (H) 09/29/2023 0750   BILITOT 2.87 (H) 06/28/2017 1236       RADIOGRAPHIC STUDIES: No results found.  ASSESSMENT AND PLAN:  This is a very pleasant 53 years old white male with paroxysmal nocturnal hemoglobinuria and currently on treatment with Soliris  on days 1 and 15 every 4 weeks status post 45 cycles.  He is also on the same treatment received at outside infusion center status post 19 more cycles. He also completed a course of tapered dose of prednisone . He is currently on treatment with Ultomiris  status post induction dose followed by 31 cycles of maintenance therapy.  He was receiving his treatment at the [facility] infusion center but because of significant lack of payment they declined to give him the infusions there  anymore.  Starting from cycle #18 he received his treatment at the [facility] in [place]. Assessment and Plan    Paroxysmal nocturnal hemoglobinuria (PNH) PNH is well-managed with no new symptoms such as bleeding or bruising. Hemoglobin is 14.2, hematocrit is 40.4, and white blood count is within normal limits. Current treatment with Ultomiris  is effective with no signs of exacerbation. Future treatment options, including oral drugs and patches, were discussed, but continuation of current treatment is preferred due to its effectiveness. - Continue Ultomiris  every eight weeks. - Maintain prednisone  at 5 mg. - Discuss future treatment options if current treatment becomes ineffective.  Obesity Weight is 179 pounds, above the  target of 165 pounds. Reports difficulty with exercise due to exhaustion and inability to perform previous levels of physical activity. Dietary changes and increased physical activity were encouraged to address weight concerns. - Encourage dietary changes, including more greens and fewer carbohydrates. - Advise reduction in alcohol consumption, particularly on weekends. - Encourage regular physical activity, starting slowly to avoid exhaustion.  Alcohol use Increased alcohol consumption during the week may impact liver health and overall well-being. Discussed the importance of reducing alcohol intake to prevent potential liver damage and other health issues. - Advise reduction in alcohol consumption, particularly during the week. - Discuss potential health risks associated with excessive alcohol use, including liver damage.   The patient was advised to call immediately if he has any concerning symptoms in the interval. The patient voices understanding of current disease status and treatment options and is in agreement with the current care plan. All questions were answered. The patient knows to call the clinic with any problems, questions or concerns. We can certainly see the patient much sooner if necessary.  Disclaimer: This note was dictated with voice recognition software. Similar sounding words can inadvertently be transcribed and may not be corrected upon review.

## 2023-11-25 NOTE — Progress Notes (Signed)
 Patient tolerated treatment well. Patient declined observation.

## 2023-11-29 ENCOUNTER — Telehealth: Payer: Self-pay

## 2023-11-29 NOTE — Telephone Encounter (Signed)
 Spoke with patient in regards to prednisone  refill.  Informed patient that the pharmacy received the refill on 11/25/23. Patient voiced understanding and was told to call with any concerns regarding the refill.

## 2024-01-12 ENCOUNTER — Encounter: Payer: Self-pay | Admitting: Internal Medicine

## 2024-01-13 NOTE — Progress Notes (Signed)
 [facility] OFFICE PROGRESS NOTE  Travis Emery CROME, MD [address] [place] 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   PRIOR THERAPY: Soliris  (Eculizumab ) initially with induction 600 MG IV weekly for 4 weeks and then switched to maintenance treatment 900 MG IV every 2 weeks. First dose of treatment was given on 11/21/2014. Status post 45 cycles.  After cycle #45 the patient received his treatment at outside infusion center.  Status post 16 more cycles of treatment.  This was discontinued secondary to worsening symptoms and lack of control of his disease.   CURRENT THERAPY: 1. Ultomiris  (Ravulizumab ) loading dose 2700 mg/M2 followed by maintenance dose 3300 mg/M2 every 8 weeks.  First dose of treatment September 22, 2018.  He status post the induction dose of the treatment. The first 3 cycles of his treatment were given at the infusion center in [place]  .  He has received 12 cycles at the  [facility].  2. Low-dose 5 mg prednisone  p.o. daily   INTERVAL HISTORY: Travis Mcdaniel 53 y.o. male returns to the clinic today for a follow-up visit.  The patient was last seen by Dr. Sherrod on  11/25/23. The patient is receiving all treatment with Ultomiris . He tolerates this well. He is also on 5 mg of prednisone  p.o. daily. Since last being seen he is feeling good. He is trying to be active and has been walking. He denies any recent jaundice but his urine was a little dark the other day which tends to happen when he is due for his Ultomiris .   He denies any fever, chills, or night sweats. He denies nausea. He denies any unusual constipation. He is compliant with his folic acid  supplement. He is compliant with his iron. He was on Eliquis  for DVT prophylaxis but due to financial reasons, he is currently on an 81 mg aspirin .   He is here today for evaluation repeat blood work  before undergoing his next cycle of treatment.    MEDICAL HISTORY: Past Medical History:  Diagnosis Date   Cancer (HCC)    PNH (Bone marrow)   Diverticulosis    Encounter for antineoplastic chemotherapy 01/15/2016   Hypertension     ALLERGIES:  is allergic to nsaids.  MEDICATIONS:  Current Outpatient Medications  Medication Sig Dispense Refill   ALPRAZolam  (XANAX ) 1 MG tablet Take 1 mg by mouth 3 (three) times daily.  2   ELIQUIS  2.5 MG TABS tablet TAKE 1 TABLET BY MOUTH TWICE A DAY 60 tablet 2   ferrous sulfate  325 (65 FE) MG EC tablet Take 325 mg by mouth daily.     folic acid  (FOLVITE ) 1 MG tablet Take 1 mg by mouth daily.     lisinopril -hydrochlorothiazide  (PRINZIDE ,ZESTORETIC ) 20-25 MG tablet Take 1 tablet by mouth daily.     Multiple Vitamins-Minerals (MULTIVITAMIN ADULT PO) Take by mouth.     Potassium 99 MG TABS Take 1 tablet by mouth daily.     predniSONE  (DELTASONE ) 5 MG tablet Take 1 tablet (5 mg total) by mouth daily with breakfast. 60 tablet 0   Psyllium (METAMUCIL PO) Take 1 capsule by mouth daily.     tetrahydrozoline 0.05 % ophthalmic solution Place 1 drop into both eyes 2 (two) times daily as needed (eye irritation).     No current facility-administered medications for this visit.    SURGICAL HISTORY: No past surgical history on file.  REVIEW OF SYSTEMS:  Review of Systems  Constitutional: Positive for stable/improved fatigue. Negative for appetite change, chills, fever and unexpected weight change.  HENT: Negative for mouth sores, nosebleeds, sore throat and trouble swallowing.   Eyes: Negative for eye problems and icterus.  Respiratory: Occasional dyspnea on exertion. Negative for cough, hemoptysis, and wheezing.   Cardiovascular: Negative for chest pain and leg swelling.  Gastrointestinal: Negative for abdominal pain, constipation, diarrhea, nausea and vomiting.  Genitourinary: One episode of dark urine. Negative for bladder incontinence, difficulty  urinating, dysuria, frequency and hematuria.   Musculoskeletal: Positive for chronic back pain. Negative for gait problem, neck pain and neck stiffness.  Skin: Negative for itching and rash.  Neurological: Negative for dizziness, extremity weakness, gait problem, headaches, light-headedness and seizures.  Hematological: Negative for adenopathy. Does not bruise/bleed easily.  Psychiatric/Behavioral: Negative for confusion, depression and sleep disturbance. The patient is not nervous/anxious.     PHYSICAL EXAMINATION:  There were no vitals taken for this visit.  ECOG PERFORMANCE STATUS: 1  Physical Exam  Constitutional: Oriented to person, place, and time and well-developed, well-nourished, and in no distress.  HENT:  Head: Normocephalic and atraumatic.  Mouth/Throat: Oropharynx is clear and moist. No oropharyngeal exudate.  Eyes: Conjunctivae are normal. Right eye exhibits no discharge. Left eye exhibits no discharge. No scleral icterus.  Neck: Normal range of motion. Neck supple.  Cardiovascular: Normal rate, regular rhythm, normal heart sounds and intact distal pulses.   Pulmonary/Chest: Effort normal and breath sounds normal. No respiratory distress. No wheezes. No rales.  Abdominal: Soft. Bowel sounds are normal. Exhibits no distension and no mass. There is no tenderness.  Musculoskeletal: Normal range of motion. Exhibits no edema.  Lymphadenopathy:    No cervical adenopathy.  Neurological: Alert and oriented to person, place, and time. Exhibits normal muscle tone. Gait normal. Coordination normal.  Skin: Skin is warm and dry. No rash noted. Not diaphoretic. No erythema. No pallor.  Psychiatric: Mood, memory and judgment normal.  Vitals reviewed.  LABORATORY DATA: Lab Results  Component Value Date   WBC 5.6 11/25/2023   HGB 14.2 11/25/2023   HCT 40.0 11/25/2023   MCV 111.4 (H) 11/25/2023   PLT 112 (L) 11/25/2023      Chemistry      Component Value Date/Time   NA 134 (L)  11/25/2023 0723   NA 136 06/28/2017 1236   K 3.4 (L) 11/25/2023 0723   K 4.1 06/28/2017 1236   CL 101 11/25/2023 0723   CO2 25 11/25/2023 0723   CO2 24 06/28/2017 1236   BUN 13 11/25/2023 0723   BUN 17.1 06/28/2017 1236   CREATININE 0.92 11/25/2023 0723   CREATININE 1.0 06/28/2017 1236      Component Value Date/Time   CALCIUM 9.1 11/25/2023 0723   CALCIUM 9.1 06/28/2017 1236   ALKPHOS 50 11/25/2023 0723   ALKPHOS 66 06/28/2017 1236   AST 85 (H) 11/25/2023 0723   AST 40 (H) 06/28/2017 1236   ALT 119 (H) 11/25/2023 0723   ALT 60 (H) 06/28/2017 1236   BILITOT 2.9 (H) 11/25/2023 0723   BILITOT 2.87 (H) 06/28/2017 1236       RADIOGRAPHIC STUDIES:  No results found.   ASSESSMENT/PLAN:  This is a very pleasant 53 year old Caucasian male with paroxysmal nocturnal hemoglobinuria.  He been on treatment with Solaris on days 1 and 15 every 4 weeks.  He is status post 45 cycles.  He received an additional 19 cycles an outside infusion center.  He previously completed a tapering dose  of prednisone  that was started on September/October 2019 for an episode of hemolytic anemia. His last prednisone  taper was in November 2021.    Due to convenience and his previous treatment with Solaris interfering with his work schedule/performance, he was interested in starting Ultomiris .  His insurance requires him to receive treatment from an outside infusion center due to the facility fee here at the cancer center.  His first dose was on May 6th, 2020. Status post induction dose followed by maintenance therapy.  He is currently receiving his infusion of the Ultomiris  at the [facility] infusion center. tarting from cycle #18 he received his treatment at the [facility] health cancer Center in [place].    He continues to tolerate his treatment well with no concerning adverse effects     The patient was started on low-dose prednisone .  He is feeling well at this time.   He is here today for evaluation repeat  blood work.  His labs today show normal Hbg of 13.7. His platelet count is 107 which may be due to his liver disease. His bilirubin is  elevated at 3.9  His AST and ALT typically are elevated. The patient also does have fatty liver disease.  He will continue on the same treatment at the same dose. I reviewed his labs with Dr. Sherrod. I will arrange for a follow up lab appointment in 1 month.    Continue the patient on 5 mg of prednisone . I have refilled this.   He will receive his Ultomiris  as scheduled   He will continue on 81 mg aspirin . I did give him their customer service number to [employer] to follow up.    The patient was advised to call immediately if she has any concerning symptoms in the interval. The patient voices understanding of current disease status and treatment options and is in agreement with the current care plan. All questions were answered. The patient knows to call the clinic with any problems, questions or concerns. We can certainly see the patient much sooner if necessary    No orders of the defined types were placed in this encounter.   The total time spent in the appointment was 20-29 minutes  Valerye Kobus L Normon Pettijohn, [name] 01/13/24

## 2024-01-19 ENCOUNTER — Inpatient Hospital Stay: Attending: Internal Medicine

## 2024-01-19 ENCOUNTER — Inpatient Hospital Stay

## 2024-01-19 ENCOUNTER — Encounter: Payer: Self-pay | Admitting: *Deleted

## 2024-01-19 ENCOUNTER — Inpatient Hospital Stay (HOSPITAL_BASED_OUTPATIENT_CLINIC_OR_DEPARTMENT_OTHER): Admitting: Physician Assistant

## 2024-01-19 VITALS — BP 133/82 | HR 84 | Temp 97.7°F | Resp 14 | Wt 178.0 lb

## 2024-01-19 DIAGNOSIS — D595 Paroxysmal nocturnal hemoglobinuria [Marchiafava-Micheli]: Secondary | ICD-10-CM

## 2024-01-19 DIAGNOSIS — Z5111 Encounter for antineoplastic chemotherapy: Secondary | ICD-10-CM | POA: Diagnosis not present

## 2024-01-19 DIAGNOSIS — Z7982 Long term (current) use of aspirin: Secondary | ICD-10-CM | POA: Insufficient documentation

## 2024-01-19 DIAGNOSIS — Z7952 Long term (current) use of systemic steroids: Secondary | ICD-10-CM | POA: Diagnosis not present

## 2024-01-19 LAB — CBC WITH DIFFERENTIAL (CANCER CENTER ONLY)
Abs Immature Granulocytes: 0.01 K/uL (ref 0.00–0.07)
Basophils Absolute: 0 K/uL (ref 0.0–0.1)
Basophils Relative: 1 %
Eosinophils Absolute: 0.1 K/uL (ref 0.0–0.5)
Eosinophils Relative: 1 %
HCT: 37.2 % — ABNORMAL LOW (ref 39.0–52.0)
Hemoglobin: 13.7 g/dL (ref 13.0–17.0)
Immature Granulocytes: 0 %
Lymphocytes Relative: 14 %
Lymphs Abs: 0.8 K/uL (ref 0.7–4.0)
MCH: 40.4 pg — ABNORMAL HIGH (ref 26.0–34.0)
MCHC: 36.8 g/dL — ABNORMAL HIGH (ref 30.0–36.0)
MCV: 109.7 fL — ABNORMAL HIGH (ref 80.0–100.0)
Monocytes Absolute: 0.6 K/uL (ref 0.1–1.0)
Monocytes Relative: 10 %
Neutro Abs: 4.2 K/uL (ref 1.7–7.7)
Neutrophils Relative %: 74 %
Platelet Count: 107 K/uL — ABNORMAL LOW (ref 150–400)
RBC: 3.39 MIL/uL — ABNORMAL LOW (ref 4.22–5.81)
RDW: 14.3 % (ref 11.5–15.5)
WBC Count: 5.6 K/uL (ref 4.0–10.5)
nRBC: 0 % (ref 0.0–0.2)

## 2024-01-19 LAB — CMP (CANCER CENTER ONLY)
ALT: 152 U/L — ABNORMAL HIGH (ref 0–44)
AST: 143 U/L — ABNORMAL HIGH (ref 15–41)
Albumin: 4.3 g/dL (ref 3.5–5.0)
Alkaline Phosphatase: 52 U/L (ref 38–126)
Anion gap: 9 (ref 5–15)
BUN: 15 mg/dL (ref 6–20)
CO2: 25 mmol/L (ref 22–32)
Calcium: 9.2 mg/dL (ref 8.9–10.3)
Chloride: 99 mmol/L (ref 98–111)
Creatinine: 0.96 mg/dL (ref 0.61–1.24)
GFR, Estimated: >60 mL/min (ref >60)
Glucose, Bld: 108 mg/dL — ABNORMAL HIGH (ref 70–99)
Potassium: 3.6 mmol/L (ref 3.5–5.1)
Sodium: 133 mmol/L — ABNORMAL LOW (ref 135–145)
Total Bilirubin: 3.9 mg/dL (ref 0.0–1.2)
Total Protein: 7 g/dL (ref 6.5–8.1)

## 2024-01-19 LAB — LACTATE DEHYDROGENASE: LDH: 251 U/L — ABNORMAL HIGH (ref 98–192)

## 2024-01-19 MED ORDER — SODIUM CHLORIDE 0.9 % IV SOLN
Freq: Once | INTRAVENOUS | Status: AC
Start: 2024-01-19 — End: 2024-01-19

## 2024-01-19 MED ORDER — SODIUM CHLORIDE 0.9 % IV SOLN
3300.0000 mg | Freq: Once | INTRAVENOUS | Status: AC
  Administered 2024-01-19: 3300 mg via INTRAVENOUS
  Filled 2024-01-19 (×2): qty 33

## 2024-01-19 MED ORDER — PREDNISONE 5 MG PO TABS: 5.0000 mg | ORAL_TABLET | Freq: Every day | ORAL | 0 refills | Status: DC

## 2024-01-19 NOTE — Progress Notes (Signed)
 CRITICAL VALUE STICKER  CRITICAL VALUE: Total Bil 3.9  RECEIVER (on-site recipient of call):Kim RN  DATE & TIME NOTIFIED: 08:33 am  01/19/2024  MESSENGER (representative from lab): Harlene  MD NOTIFIED: Cassie H, PA  TIME OF NOTIFICATION: 8:35am  RESPONSE: She will speak to him about his value.

## 2024-01-19 NOTE — Patient Instructions (Signed)
 [facility] - A DEPT OF [facility]  Discharge Instructions: Thank you for choosing [facility] to provide your oncology and hematology care.   If you have a lab appointment with the Cancer Center, please go directly to the Cancer Center and check in at the registration area.   Wear comfortable clothing and clothing appropriate for easy access to any Portacath or PICC line.   We strive to give you quality time with your provider. You may need to reschedule your appointment if you arrive late (15 or more minutes).  Arriving late affects you and other patients whose appointments are after yours.  Also, if you miss three or more appointments without notifying the office, you may be dismissed from the clinic at the provider's discretion.      For prescription refill requests, have your pharmacy contact our office and allow 72 hours for refills to be completed.    Today you received the following chemotherapy and/or immunotherapy agents ultimiris      To help prevent nausea and vomiting after your treatment, we encourage you to take your nausea medication as directed.  BELOW ARE SYMPTOMS THAT SHOULD BE REPORTED IMMEDIATELY: *FEVER GREATER THAN 100.4 F (38 C) OR HIGHER *CHILLS OR SWEATING *NAUSEA AND VOMITING THAT IS NOT CONTROLLED WITH YOUR NAUSEA MEDICATION *UNUSUAL SHORTNESS OF BREATH *UNUSUAL BRUISING OR BLEEDING *URINARY PROBLEMS (pain or burning when urinating, or frequent urination) *BOWEL PROBLEMS (unusual diarrhea, constipation, pain near the anus) TENDERNESS IN MOUTH AND THROAT WITH OR WITHOUT PRESENCE OF ULCERS (sore throat, sores in mouth, or a toothache) UNUSUAL RASH, SWELLING OR PAIN  UNUSUAL VAGINAL DISCHARGE OR ITCHING   Items with * indicate a potential emergency and should be followed up as soon as possible or go to the Emergency Department if any problems should occur.  Please show the CHEMOTHERAPY ALERT CARD or IMMUNOTHERAPY  ALERT CARD at check-in to the Emergency Department and triage nurse.  Should you have questions after your visit or need to cancel or reschedule your appointment, please contact [facility] - A DEPT OF [facility]  Dept: [phone]  and follow the prompts.  Office hours are 8:00 a.m. to 4:30 p.m. Monday - Friday. Please note that voicemails left after 4:00 p.m. may not be returned until the following business day.  We are closed weekends and major holidays. You have access to a nurse at all times for urgent questions. Please call the main number to the clinic Dept: [phone] and follow the prompts.   For any non-urgent questions, you may also contact your provider using MyChart. We now offer e-Visits for anyone 30 and older to request care online for non-urgent symptoms. For details visit [email or web address].   Also download the MyChart app! Go to the app store, search MyChart, open the app, select [facility], and log in with your MyChart username and password.

## 2024-01-21 ENCOUNTER — Emergency Department (HOSPITAL_COMMUNITY)

## 2024-01-21 ENCOUNTER — Other Ambulatory Visit: Payer: Self-pay

## 2024-01-21 ENCOUNTER — Telehealth: Payer: Self-pay

## 2024-01-21 ENCOUNTER — Encounter (HOSPITAL_COMMUNITY): Payer: Self-pay | Admitting: Emergency Medicine

## 2024-01-21 ENCOUNTER — Observation Stay (HOSPITAL_COMMUNITY)
Admission: EM | Admit: 2024-01-21 | Discharge: 2024-01-22 | Disposition: A | Discharge status: Home / Self Care | Attending: Emergency Medicine | Admitting: Emergency Medicine

## 2024-01-21 DIAGNOSIS — K859 Acute pancreatitis without necrosis or infection, unspecified: Principal | ICD-10-CM | POA: Diagnosis present

## 2024-01-21 DIAGNOSIS — R7401 Elevation of levels of liver transaminase levels: Secondary | ICD-10-CM | POA: Diagnosis not present

## 2024-01-21 DIAGNOSIS — D595 Paroxysmal nocturnal hemoglobinuria [Marchiafava-Micheli]: Secondary | ICD-10-CM | POA: Diagnosis not present

## 2024-01-21 DIAGNOSIS — D696 Thrombocytopenia, unspecified: Secondary | ICD-10-CM | POA: Diagnosis not present

## 2024-01-21 DIAGNOSIS — Z79899 Other long term (current) drug therapy: Secondary | ICD-10-CM | POA: Insufficient documentation

## 2024-01-21 DIAGNOSIS — R7989 Other specified abnormal findings of blood chemistry: Secondary | ICD-10-CM | POA: Diagnosis present

## 2024-01-21 DIAGNOSIS — I10 Essential (primary) hypertension: Secondary | ICD-10-CM | POA: Diagnosis not present

## 2024-01-21 DIAGNOSIS — R319 Hematuria, unspecified: Secondary | ICD-10-CM | POA: Diagnosis present

## 2024-01-21 DIAGNOSIS — Z7901 Long term (current) use of anticoagulants: Secondary | ICD-10-CM | POA: Insufficient documentation

## 2024-01-21 DIAGNOSIS — K852 Alcohol induced acute pancreatitis without necrosis or infection: Secondary | ICD-10-CM | POA: Diagnosis not present

## 2024-01-21 DIAGNOSIS — F1010 Alcohol abuse, uncomplicated: Secondary | ICD-10-CM | POA: Diagnosis not present

## 2024-01-21 DIAGNOSIS — M109 Gout, unspecified: Secondary | ICD-10-CM | POA: Insufficient documentation

## 2024-01-21 DIAGNOSIS — R112 Nausea with vomiting, unspecified: Secondary | ICD-10-CM

## 2024-01-21 LAB — CBC WITH DIFFERENTIAL/PLATELET
Abs Immature Granulocytes: 0.08 K/uL — ABNORMAL HIGH (ref 0.00–0.07)
Basophils Absolute: 0 K/uL (ref 0.0–0.1)
Basophils Relative: 0 %
Eosinophils Absolute: 0 K/uL (ref 0.0–0.5)
Eosinophils Relative: 0 %
HCT: 42.3 % (ref 39.0–52.0)
Hemoglobin: 14.3 g/dL (ref 13.0–17.0)
Immature Granulocytes: 1 %
Lymphocytes Relative: 2 %
Lymphs Abs: 0.2 K/uL — ABNORMAL LOW (ref 0.7–4.0)
MCH: 38.1 pg — ABNORMAL HIGH (ref 26.0–34.0)
MCHC: 33.8 g/dL (ref 30.0–36.0)
MCV: 112.8 fL — ABNORMAL HIGH (ref 80.0–100.0)
Monocytes Absolute: 0.7 K/uL (ref 0.1–1.0)
Monocytes Relative: 6 %
Neutro Abs: 11 K/uL — ABNORMAL HIGH (ref 1.7–7.7)
Neutrophils Relative %: 91 %
Platelets: 118 K/uL — ABNORMAL LOW (ref 150–400)
RBC: 3.75 MIL/uL — ABNORMAL LOW (ref 4.22–5.81)
RDW: 13.9 % (ref 11.5–15.5)
WBC: 11.9 K/uL — ABNORMAL HIGH (ref 4.0–10.5)
nRBC: 0 % (ref 0.0–0.2)

## 2024-01-21 LAB — COMPREHENSIVE METABOLIC PANEL WITH GFR
ALT: 153 U/L — ABNORMAL HIGH (ref 0–44)
AST: 94 U/L — ABNORMAL HIGH (ref 15–41)
Albumin: 4.5 g/dL (ref 3.5–5.0)
Alkaline Phosphatase: 59 U/L (ref 38–126)
Anion gap: 14 (ref 5–15)
BUN: 22 mg/dL — ABNORMAL HIGH (ref 6–20)
CO2: 24 mmol/L (ref 22–32)
Calcium: 9.2 mg/dL (ref 8.9–10.3)
Chloride: 97 mmol/L — ABNORMAL LOW (ref 98–111)
Creatinine, Ser: 1.03 mg/dL (ref 0.61–1.24)
GFR, Estimated: >60 mL/min (ref >60)
Glucose, Bld: 135 mg/dL — ABNORMAL HIGH (ref 70–99)
Potassium: 3.4 mmol/L — ABNORMAL LOW (ref 3.5–5.1)
Sodium: 135 mmol/L (ref 135–145)
Total Bilirubin: 7.5 mg/dL — ABNORMAL HIGH (ref 0.0–1.2)
Total Protein: 7.7 g/dL (ref 6.5–8.1)

## 2024-01-21 LAB — URINALYSIS, ROUTINE W REFLEX MICROSCOPIC
Bilirubin Urine: NEGATIVE
Glucose, UA: NEGATIVE mg/dL
Hgb urine dipstick: NEGATIVE
Ketones, ur: NEGATIVE mg/dL
Leukocytes,Ua: NEGATIVE
Nitrite: NEGATIVE
Protein, ur: NEGATIVE mg/dL
Specific Gravity, Urine: 1.009 (ref 1.005–1.030)
pH: 6 (ref 5.0–8.0)

## 2024-01-21 LAB — LIPASE, BLOOD: Lipase: 547 U/L — ABNORMAL HIGH (ref 11–51)

## 2024-01-21 MED ORDER — PREDNISONE 20 MG PO TABS: 80.0000 mg | ORAL_TABLET | Freq: Every day | ORAL | Status: DC

## 2024-01-21 MED ORDER — HYDROMORPHONE HCL 1 MG/ML IJ SOLN: 1.0000 mg | INTRAMUSCULAR | Status: DC | PRN

## 2024-01-21 MED ORDER — PREDNISONE 50 MG PO TABS
80.0000 mg | ORAL_TABLET | Freq: Every day | ORAL | Status: DC
  Administered 2024-01-22: 80 mg via ORAL
  Filled 2024-01-21: qty 1

## 2024-01-21 MED ORDER — ACETAMINOPHEN 325 MG PO TABS: 650.0000 mg | ORAL_TABLET | Freq: Four times a day (QID) | ORAL | Status: DC | PRN

## 2024-01-21 MED ORDER — ONDANSETRON HCL 4 MG/2ML IJ SOLN
4.0000 mg | Freq: Once | INTRAMUSCULAR | Status: AC
  Administered 2024-01-21: 4 mg via INTRAVENOUS
  Filled 2024-01-21: qty 2

## 2024-01-21 MED ORDER — ACETAMINOPHEN 650 MG RE SUPP: 650.0000 mg | Freq: Four times a day (QID) | RECTAL | Status: DC | PRN

## 2024-01-21 MED ORDER — IOHEXOL 300 MG/ML  SOLN
100.0000 mL | Freq: Once | INTRAMUSCULAR | Status: AC | PRN
  Administered 2024-01-21: 100 mL via INTRAVENOUS

## 2024-01-21 MED ORDER — PREDNISONE 20 MG PO TABS: 80.0000 mg | ORAL_TABLET | Freq: Once | ORAL | Status: DC

## 2024-01-21 MED ORDER — SODIUM CHLORIDE 0.9 % IV BOLUS
1000.0000 mL | Freq: Once | INTRAVENOUS | Status: AC
  Administered 2024-01-21: 1000 mL via INTRAVENOUS

## 2024-01-21 MED ORDER — ONDANSETRON HCL 4 MG PO TABS: 4.0000 mg | ORAL_TABLET | Freq: Four times a day (QID) | ORAL | Status: DC | PRN

## 2024-01-21 MED ORDER — ENOXAPARIN SODIUM 40 MG/0.4ML IJ SOSY: 40.0000 mg | PREFILLED_SYRINGE | INTRAMUSCULAR | Status: DC

## 2024-01-21 MED ORDER — ONDANSETRON HCL 4 MG/2ML IJ SOLN: 4.0000 mg | Freq: Four times a day (QID) | INTRAMUSCULAR | Status: DC | PRN

## 2024-01-21 MED ORDER — POTASSIUM CHLORIDE IN NACL 20-0.9 MEQ/L-% IV SOLN
INTRAVENOUS | Status: DC
  Filled 2024-01-21 (×3): qty 1000

## 2024-01-21 MED ORDER — POTASSIUM CHLORIDE 10 MEQ/100ML IV SOLN
10.0000 meq | INTRAVENOUS | Status: AC
  Administered 2024-01-21: 10 meq via INTRAVENOUS
  Filled 2024-01-21 (×2): qty 100

## 2024-01-21 MED ORDER — HYDROMORPHONE HCL 1 MG/ML IJ SOLN
1.0000 mg | Freq: Once | INTRAMUSCULAR | Status: AC
  Administered 2024-01-21: 1 mg via INTRAVENOUS
  Filled 2024-01-21: qty 1

## 2024-01-21 MED ORDER — MORPHINE SULFATE (PF) 4 MG/ML IV SOLN: 4.0000 mg | INTRAVENOUS | Status: DC | PRN

## 2024-01-21 MED ORDER — PANTOPRAZOLE SODIUM 40 MG IV SOLR
40.0000 mg | Freq: Once | INTRAVENOUS | Status: AC
  Administered 2024-01-21: 40 mg via INTRAVENOUS
  Filled 2024-01-21: qty 10

## 2024-01-21 MED ORDER — METHYLPREDNISOLONE SODIUM SUCC 125 MG IJ SOLR
125.0000 mg | Freq: Once | INTRAMUSCULAR | Status: AC
  Administered 2024-01-21: 125 mg via INTRAVENOUS
  Filled 2024-01-21: qty 2

## 2024-01-21 NOTE — ED Provider Notes (Signed)
  Physical Exam  BP (!) 144/76 (BP Location: Right Arm)   Pulse 96   Temp 98.2 F (36.8 C) (Oral)   Resp 17   Ht 5' 9 (1.753 m)   Wt 76.7 kg   SpO2 95%   BMI 24.96 kg/m   Physical Exam Vitals and nursing note reviewed.  HENT:     Head: Normocephalic and atraumatic.  Eyes:     Pupils: Pupils are equal, round, and reactive to light.  Cardiovascular:     Rate and Rhythm: Normal rate and regular rhythm.  Pulmonary:     Effort: Pulmonary effort is normal.     Breath sounds: Normal breath sounds.  Abdominal:     Palpations: Abdomen is soft.     Tenderness: There is no abdominal tenderness.  Skin:    General: Skin is warm and dry.  Neurological:     Mental Status: He is alert.  Psychiatric:        Mood and Affect: Mood normal.     Procedures  Procedures  ED Course / MDM   Clinical Course as of 01/21/24 1612  Fri Jan 21, 2024  1547 Dr Suzette has discussed with patient's hematology oncology team.  They recommend GI consult and plan for admission.  No therapeutic recommendations at this time. [MP]  1611 Discussed with GI who will follow patient during admission.  No further recommendations at this time.  Discussed admitting hospitalist except patient for admission [MP]    Clinical Course User Index [MP] Pamella Ozell LABOR, DO   Medical Decision Making I, Ozell Pamella DO, have assumed care of this patient from the previous provider pending recommendations from GI and admission  Amount and/or Complexity of Data Reviewed Labs: ordered. Radiology: ordered.  Risk Prescription drug management. Decision regarding hospitalization.          Pamella Ozell LABOR, DO 01/21/24 [phone]

## 2024-01-21 NOTE — H&P (Signed)
 History and Physical    Patient: Travis Mcdaniel FMW:[id number] DOB: 02/15/1971 DOA: 01/21/2024 DOS: the patient was seen and examined on 01/21/2024 PCP: [name], MD  Patient coming from: Home  Chief Complaint:  Chief Complaint  Patient presents with   Hematuria   Emesis   HPI: Travis Mcdaniel is a 53 y.o. male with medical history significant of alcohol abuse, anxiety, paroxysmal nocturnal hemoglobinuria, hemolytic anemia, thrombocytopenia, hyperbilirubinemia, hypertension followed by hem-onc who presented to the emergency department with nausea, multiple episodes of emesis and hematuria.  He stated he does not drink daily.  He last used alcohol on Saturday when he had 4 drinks.  To his knowledge he does not have hypertriglyceridemia.  He uses hydrochlorothiazide . No diarrhea, constipation, melena or hematochezia.  No flank pain, dysuria or frequency. He denied fever, chills, rhinorrhea, sore throat, wheezing or hemoptysis.  No chest pain, palpitations, diaphoresis, PND, orthopnea or pitting edema of the lower extremities.   No polyuria, polydipsia, polyphagia or blurred vision.   Lab work: Urinalysis was normal.  CBC showed a white count of 11.9, hemoglobin 14.3 g/dL with an MCV of 1 of 87.1 fL and platelets 118.  Lipase was 547 units/L.  Lipase was 547.  CMP showed a potassium of 3.4 and chloride 97 mmol/L, the rest of the electrolytes were normal.  Glucose 135, BUN 22, creatinine 1.03 and total bilirubin 7.5 mg/dL.  AST was 94 and ALT 153 units/L.  Normal alk phos, total protein and albumin level.  Imaging: CT abdomen/pelvis with contrast showing mild acute pancreatitis.  No definite pseudocyst formation.  Mild gallbladder wall thickening without cholelithiasis.  RUQ ultrasound recommended.  Sigmoid diverticulosis without inflammation.  Aortic atherosclerosis.   ED course: Initial vital signs were temperature 98.3 F, pulse 92, respirations 16, BP 168/89 mmHg O2 sat 100% on room  air.  The patient received 1000 mL normal saline bolus, pantoprazole  40 mg IVP and ondansetron  4 mg IVP.  Review of Systems: As mentioned in the history of present illness. All other systems reviewed and are negative. Past Medical History:  Diagnosis Date   Cancer (HCC)    PNH (Bone marrow)   Diverticulosis    Encounter for antineoplastic chemotherapy 01/15/2016   Hypertension    History reviewed. No pertinent surgical history. Social History:  reports that he has never smoked. He has never used smokeless tobacco. He reports current alcohol use. He reports that he does not use drugs.  Allergies  Allergen Reactions   Nsaids Other (See Comments)    Due to the type of Cancer the Patient has, he can't take NSAIDS. Bone Marrow Cancer    Family History  Problem Relation Age of Onset   Alcohol abuse Brother    Cirrhosis Brother        deceased    Prior to Admission medications   Medication Sig Start Date End Date Taking? Authorizing Provider  ALPRAZolam  (XANAX ) 1 MG tablet Take 1 mg by mouth 3 (three) times daily as needed for anxiety. 10/09/15  Yes [provider]  aspirin  EC 81 MG tablet Take 81 mg by mouth in the morning. Swallow whole.   Yes [provider]  Cholecalciferol (VITAMIN D3) 125 MCG (5000 UT) CHEW Chew 5,000 Units by mouth daily.   Yes [provider]  ferrous sulfate  325 (65 FE) MG EC tablet Take 325 mg by mouth every other day.   Yes [provider]  folic acid  (FOLVITE ) 800 MCG tablet Take 800 mcg  by mouth daily.   Yes [provider]  lisinopril -hydrochlorothiazide  (PRINZIDE ,ZESTORETIC ) 20-25 MG tablet Take 1 tablet by mouth daily.   Yes [provider]  Multiple Vitamins-Minerals (MULTIVITAMIN ADULT PO) Take 1 tablet by mouth daily with breakfast.   Yes [provider]  oxyCODONE  (OXY IR/ROXICODONE ) 5 MG immediate release tablet Take 5 mg by mouth daily as needed (for pain).   Yes [provider]   Potassium 99 MG TABS Take 99 mg by mouth daily.   Yes [provider]  predniSONE  (DELTASONE ) 5 MG tablet Take 1 tablet (5 mg total) by mouth daily with breakfast. 01/19/24  Yes Heilingoetter, Cassandra L, [name]  Psyllium (METAMUCIL PO) Take 4 g by mouth See admin instructions. Mix 4 grams (1 teaspoonful) into a glass of water and drink by mouth once a day   Yes [provider]  tetrahydrozoline 0.05 % ophthalmic solution Place 1 drop into both eyes 2 (two) times daily as needed (eye irritation).   Yes [provider]  ELIQUIS  2.5 MG TABS tablet TAKE 1 TABLET BY MOUTH TWICE A DAY Patient not taking: Reported on 01/21/2024 07/28/23   Heilingoetter, Calton CROME, [name]    Physical Exam: Vitals:   01/21/24 1315 01/21/24 1330 01/21/24 1400 01/21/24 1415  BP: (!) 152/83 (!) 144/76    Pulse: 92 92 87 96  Resp:  17    Temp:  98.2 F (36.8 C)    TempSrc:  Oral    SpO2: 97% 95% 97% 95%  Weight:      Height:       Physical Exam Vitals and nursing note reviewed.  Constitutional:      General: He is awake. He is not in acute distress.    Appearance: He is ill-appearing.  HENT:     Head: Normocephalic.     Nose: No rhinorrhea.     Mouth/Throat:     Mouth: Mucous membranes are dry.  Eyes:     General: Scleral icterus present.     Pupils: Pupils are equal, round, and reactive to light.  Neck:     Vascular: No JVD.  Cardiovascular:     Rate and Rhythm: Normal rate and regular rhythm.     Heart sounds: S1 normal and S2 normal.  Pulmonary:     Effort: Pulmonary effort is normal.     Breath sounds: Normal breath sounds. No wheezing, rhonchi or rales.  Abdominal:     General: Abdomen is protuberant. Bowel sounds are normal. There is no distension.     Palpations: Abdomen is soft.     Tenderness: There is abdominal tenderness in the epigastric area. There is no right CVA tenderness or left CVA tenderness.  Musculoskeletal:     Cervical back: Neck supple.     Right  lower leg: No edema.     Left lower leg: No edema.  Skin:    General: Skin is warm and dry.     Coloration: Skin is jaundiced.  Neurological:     General: No focal deficit present.     Mental Status: He is alert and oriented to person, place, and time.  Psychiatric:        Mood and Affect: Mood normal.        Behavior: Behavior normal. Behavior is cooperative.     Data Reviewed:  Results are pending, will review when available.  Assessment and Plan: Principal Problem:   Acute pancreatitis Observation/telemetry. NS 1000 mm bolus x 1. Continue IV fluids.  Clear liquid diet. Analgesics as needed. Antiemetics as needed. Pantoprazole  40 mg IVP daily. Follow CBC, CMP and lipase in AM. Alcohol cessation advised.  Active Problems:   Hyperbilirubinemia Associated with:   Elevated LFTs In the setting of:   Paroxysmal nocturnal hemoglobinuria (PNH) (HCC) Continue IV fluids. Has been off anticoagulation. DVT prophylaxis with Lovenox . Feel prednisolone 125 mg IVP x 1. Will switch to prednisone  tomorrow once GI symptoms better. -Usually treatment is 80 mg of prednisone  x 7 days. Then 60 mg x 7 days, then 40 mg x 7 days, then 20 mg x 7 days. Then 10 mg x 7 days and then 5 mg p.o. daily.    Thrombocytopenia (HCC) Monitor platelet count.    Hypertension May need to hold off on diuretic. Continue lisinopril  20 mg p.o. daily.    Alcohol abuse No need for lorazepam  taper. Cessation advised multiple times.    Advance Care Planning:   Code Status: Full Code   Consults: GI will be consulting.  Family Communication:   Severity of Illness: The appropriate patient status for this patient is OBSERVATION. Observation status is judged to be reasonable and necessary in order to provide the required intensity of service to ensure the patient's safety. The patient's presenting symptoms, physical exam findings, and initial radiographic and laboratory data in the context of their  medical condition is felt to place them at decreased risk for further clinical deterioration. Furthermore, it is anticipated that the patient will be medically stable for discharge from the hospital within 2 midnights of admission.   Author: Alm Dorn Castor, MD 01/21/2024 4:00 PM  For on call review [email or web address].   This document was prepared using Dragon voice recognition software and may contain some unintended transcription errors.

## 2024-01-21 NOTE — ED Provider Notes (Signed)
 [place] EMERGENCY DEPARTMENT AT [facility] Provider Note   CSN: [id number] Arrival date & time: 01/21/24  9346     Patient presents with: Hematuria and Emesis   Travis Mcdaniel is a 53 y.o. male.   Patient has a history of paroxysmal nocturnal hemoglobinemia.  He presents today with abdominal pain and vomiting.  He states he got an infusion 2 days ago.  Patient is no longer having any pain.  The history is provided by the patient and medical records. No language interpreter was used.  Emesis Severity:  Moderate Timing:  Constant Quality:  Bilious material Able to tolerate:  Liquids Progression:  Improving Chronicity:  New Recent urination:  Normal Context: not post-tussive   Worsened by:  Nothing Ineffective treatments:  None tried Associated symptoms: no abdominal pain, no cough, no diarrhea and no headaches        Prior to Admission medications   Medication Sig Start Date End Date Taking? Authorizing Provider  ALPRAZolam  (XANAX ) 1 MG tablet Take 1 mg by mouth 3 (three) times daily as needed for anxiety. 10/09/15  Yes [provider]  aspirin  EC 81 MG tablet Take 81 mg by mouth in the morning. Swallow whole.   Yes [provider]  Cholecalciferol (VITAMIN D3) 125 MCG (5000 UT) CHEW Chew 5,000 Units by mouth daily.   Yes [provider]  ferrous sulfate  325 (65 FE) MG EC tablet Take 325 mg by mouth every other day.   Yes [provider]  folic acid  (FOLVITE ) 800 MCG tablet Take 800 mcg by mouth daily.   Yes [provider]  lisinopril -hydrochlorothiazide  (PRINZIDE ,ZESTORETIC ) 20-25 MG tablet Take 1 tablet by mouth daily.   Yes [provider]  Multiple Vitamins-Minerals (MULTIVITAMIN ADULT PO) Take 1 tablet by mouth daily with breakfast.   Yes [provider]  oxyCODONE  (OXY IR/ROXICODONE ) 5 MG immediate release tablet Take 5 mg by mouth daily as needed (for pain).   Yes [provider]   Potassium 99 MG TABS Take 99 mg by mouth daily.   Yes [provider]  predniSONE  (DELTASONE ) 5 MG tablet Take 1 tablet (5 mg total) by mouth daily with breakfast. 01/19/24  Yes Heilingoetter, Cassandra L, [name]  Psyllium (METAMUCIL PO) Take 4 g by mouth See admin instructions. Mix 4 grams (1 teaspoonful) into a glass of water and drink by mouth once a day   Yes [provider]  tetrahydrozoline 0.05 % ophthalmic solution Place 1 drop into both eyes 2 (two) times daily as needed (eye irritation).   Yes [provider]  ELIQUIS  2.5 MG TABS tablet TAKE 1 TABLET BY MOUTH TWICE A DAY Patient not taking: Reported on 01/21/2024 07/28/23   Heilingoetter, Cassandra L, [name]    Allergies: Nsaids    Review of Systems  Constitutional:  Negative for appetite change and fatigue.  HENT:  Negative for congestion, ear discharge and sinus pressure.   Eyes:  Negative for discharge.  Respiratory:  Negative for cough.   Cardiovascular:  Negative for chest pain.  Gastrointestinal:  Positive for vomiting. Negative for abdominal pain and diarrhea.  Genitourinary:  Negative for frequency and hematuria.  Musculoskeletal:  Negative for back pain.  Skin:  Negative for rash.  Neurological:  Negative for seizures and headaches.  Psychiatric/Behavioral:  Negative for hallucinations.     Updated Vital Signs BP (!) 144/76 (BP Location: Right Arm)   Pulse 96   Temp 98.2 F (36.8 C) (Oral)   Resp  17   Ht 5' 9 (1.753 m)   Wt 76.7 kg   SpO2 95%   BMI 24.96 kg/m   Physical Exam Vitals and nursing note reviewed.  Constitutional:      Appearance: He is well-developed.  HENT:     Head: Normocephalic.     Nose: Nose normal.  Eyes:     General: No scleral icterus.    Conjunctiva/sclera: Conjunctivae normal.  Neck:     Thyroid: No thyromegaly.  Cardiovascular:     Rate and Rhythm: Normal rate and regular rhythm.     Heart sounds: No murmur heard.    No friction rub. No gallop.   Pulmonary:     Breath sounds: No stridor. No wheezing or rales.  Chest:     Chest wall: No tenderness.  Abdominal:     General: There is distension.     Tenderness: There is no abdominal tenderness. There is no rebound.  Musculoskeletal:        General: Normal range of motion.     Cervical back: Neck supple.  Lymphadenopathy:     Cervical: No cervical adenopathy.  Skin:    Findings: No erythema or rash.  Neurological:     Mental Status: He is alert and oriented to person, place, and time.     Motor: No abnormal muscle tone.     Coordination: Coordination normal.  Psychiatric:        Behavior: Behavior normal.     (all labs ordered are listed, but only abnormal results are displayed) Labs Reviewed  CBC WITH DIFFERENTIAL/PLATELET - Abnormal; Notable for the following components:      Result Value   WBC 11.9 (*)    RBC 3.75 (*)    MCV 112.8 (*)    MCH 38.1 (*)    Platelets 118 (*)    Neutro Abs 11.0 (*)    Lymphs Abs 0.2 (*)    Abs Immature Granulocytes 0.08 (*)    All other components within normal limits  COMPREHENSIVE METABOLIC PANEL WITH GFR - Abnormal; Notable for the following components:   Potassium 3.4 (*)    Chloride 97 (*)    Glucose, Bld 135 (*)    BUN 22 (*)    AST 94 (*)    ALT 153 (*)    Total Bilirubin 7.5 (*)    All other components within normal limits  LIPASE, BLOOD - Abnormal; Notable for the following components:   Lipase 547 (*)    All other components within normal limits  URINALYSIS, ROUTINE W REFLEX MICROSCOPIC    EKG: None  Radiology: CT ABDOMEN PELVIS W CONTRAST Result Date: 01/21/2024 CLINICAL DATA:  Vomiting, gross hematuria. EXAM: CT ABDOMEN AND PELVIS WITH CONTRAST TECHNIQUE: Multidetector CT imaging of the abdomen and pelvis was performed using the standard protocol following bolus administration of intravenous contrast. RADIATION DOSE REDUCTION: This exam was performed according to the departmental dose-optimization program which  includes automated exposure control, adjustment of the mA and/or kV according to patient size and/or use of iterative reconstruction technique. CONTRAST:  OMNIPAQUE  IOHEXOL  300 MG/ML  SOLN COMPARISON:  January 12, 2023. FINDINGS: Lower chest: No acute abnormality. Hepatobiliary: No cholelithiasis is noted, but mild gallbladder wall thickening is noted. No biliary dilatation. Left hepatic cysts are noted. Pancreas: Mild inflammatory changes are noted around the pancreatic body and tail suggesting acute pancreatitis. No pseudocyst formation or ductal dilatation is noted. Spleen: Normal in size without focal abnormality. Adrenals/Urinary Tract: Adrenal glands are unremarkable.  Kidneys are normal, without renal calculi, focal lesion, or hydronephrosis. Bladder is unremarkable. Stomach/Bowel: Stomach is within normal limits. Appendix appears normal. No evidence of bowel wall thickening, distention, or inflammatory changes. Sigmoid diverticulosis without inflammation. Vascular/Lymphatic: Aortic atherosclerosis. No enlarged abdominal or pelvic lymph nodes. Reproductive: Prostate is unremarkable. Other: No ascites or hernia is noted. Musculoskeletal: No acute or significant osseous findings. IMPRESSION: Findings consistent with mild acute pancreatitis. No definite pseudocyst formation is noted. Mild gallbladder wall thickening is noted without cholelithiasis. Ultrasound is recommended for further evaluation. Sigmoid diverticulosis without inflammation. Aortic Atherosclerosis (ICD10-I70.0). Electronically Signed   By: Lynwood Landy Raddle M.D.   On: 01/21/2024 14:10     Procedures   Medications Ordered in the ED  sodium chloride  0.9 % bolus 1,000 mL (1,000 mLs Intravenous New Bag/Given 01/21/24 1143)  pantoprazole  (PROTONIX ) injection 40 mg (40 mg Intravenous Given 01/21/24 1147)  ondansetron  (ZOFRAN ) injection 4 mg (4 mg Intravenous Given 01/21/24 1146)  iohexol  (OMNIPAQUE ) 300 MG/ML solution 100 mL (100 mLs  Intravenous Contrast Given 01/21/24 1340)  CRITICAL CARE Performed by: Fairy Sermon Total critical care time: 45 minutes Critical care time was exclusive of separately billable procedures and treating other patients. Critical care was necessary to treat or prevent imminent or life-threatening deterioration. Critical care was time spent personally by me on the following activities: development of treatment plan with patient and/or surrogate as well as nursing, discussions with consultants, evaluation of patient's response to treatment, examination of patient, obtaining history from patient or surrogate, ordering and performing treatments and interventions, ordering and review of laboratory studies, ordering and review of radiographic studies, pulse oximetry and re-evaluation of patient's condition.   Clinical Course as of 01/23/24 1038  Fri Jan 21, 2024  1547 Dr Sermon has discussed with patient's hematology oncology team.  They recommend GI consult and plan for admission.  No therapeutic recommendations at this time. [MP]  1611 Discussed with GI who will follow patient during admission.  No further recommendations at this time.  Discussed admitting hospitalist except patient for admission [MP]    Clinical Course User Index [MP] Pamella Ozell LABOR, DO                                 Medical Decision Making Amount and/or Complexity of Data Reviewed Labs: ordered. Radiology: ordered.  Risk Prescription drug management. Decision regarding hospitalization.   Patient with pancreatitis along with elevated bilirubin at 7.5.  Dr. Pamella will disposition patient.  I spoke with oncology and they will follow the patient in the hospital and want GI to consult along with the hospitalist admitting     Final diagnoses:  None    ED Discharge Orders     None          Sermon Fairy, MD 01/23/24 1040

## 2024-01-21 NOTE — ED Triage Notes (Signed)
 Pt came in after infusion for PNX on wednesday, reported voiding bloody urine and emesis since then. Ingested what he believes to be bad greens and deviled eggs. Increase pressure and gas in stomach.

## 2024-01-21 NOTE — Telephone Encounter (Signed)
 Patient called and reported that he received treatment on Wednesday. He began feeling queasy with mouth watering last night and has been experiencing vomiting. He also noted hematuria starting last night. Patient denies any pain or burning with urination and denies blood in his stool.  Patient presented to the ER this morning. He reports no further episodes of vomiting since this morning and suspects food poisoning. ER has drawn labs and placed an order for a CT scan of the abdomen and pelvis. Patient was informed that he is in the appropriate place for evaluation. Per his request, information will be relayed to Dr. Sherrod.

## 2024-01-21 NOTE — Plan of Care (Signed)
   Problem: Nutrition: Goal: Adequate nutrition will be maintained Outcome: Progressing   Problem: Pain Managment: Goal: General experience of comfort will improve and/or be controlled Outcome: Progressing   Problem: Safety: Goal: Ability to remain free from injury will improve Outcome: Progressing

## 2024-01-22 DIAGNOSIS — I10 Essential (primary) hypertension: Secondary | ICD-10-CM

## 2024-01-22 DIAGNOSIS — D595 Paroxysmal nocturnal hemoglobinuria [Marchiafava-Micheli]: Secondary | ICD-10-CM

## 2024-01-22 DIAGNOSIS — R7989 Other specified abnormal findings of blood chemistry: Secondary | ICD-10-CM | POA: Diagnosis not present

## 2024-01-22 DIAGNOSIS — F1010 Alcohol abuse, uncomplicated: Secondary | ICD-10-CM

## 2024-01-22 DIAGNOSIS — D696 Thrombocytopenia, unspecified: Secondary | ICD-10-CM

## 2024-01-22 DIAGNOSIS — K852 Alcohol induced acute pancreatitis without necrosis or infection: Secondary | ICD-10-CM | POA: Diagnosis not present

## 2024-01-22 LAB — CBC
HCT: 32 % — ABNORMAL LOW (ref 39.0–52.0)
Hemoglobin: 11.1 g/dL — ABNORMAL LOW (ref 13.0–17.0)
MCH: 40.4 pg — ABNORMAL HIGH (ref 26.0–34.0)
MCHC: 34.7 g/dL (ref 30.0–36.0)
MCV: 116.4 fL — ABNORMAL HIGH (ref 80.0–100.0)
Platelets: 86 K/uL — ABNORMAL LOW (ref 150–400)
RBC: 2.75 MIL/uL — ABNORMAL LOW (ref 4.22–5.81)
RDW: 13.2 % (ref 11.5–15.5)
WBC: 5.9 K/uL (ref 4.0–10.5)
nRBC: 0 % (ref 0.0–0.2)

## 2024-01-22 LAB — COMPREHENSIVE METABOLIC PANEL WITH GFR
ALT: 101 U/L — ABNORMAL HIGH (ref 0–44)
AST: 51 U/L — ABNORMAL HIGH (ref 15–41)
Albumin: 3.5 g/dL (ref 3.5–5.0)
Alkaline Phosphatase: 46 U/L (ref 38–126)
Anion gap: 7 (ref 5–15)
BUN: 16 mg/dL (ref 6–20)
CO2: 24 mmol/L (ref 22–32)
Calcium: 8.3 mg/dL — ABNORMAL LOW (ref 8.9–10.3)
Chloride: 103 mmol/L (ref 98–111)
Creatinine, Ser: 0.62 mg/dL (ref 0.61–1.24)
GFR, Estimated: >60 mL/min (ref >60)
Glucose, Bld: 133 mg/dL — ABNORMAL HIGH (ref 70–99)
Potassium: 3.9 mmol/L (ref 3.5–5.1)
Sodium: 134 mmol/L — ABNORMAL LOW (ref 135–145)
Total Bilirubin: 5.1 mg/dL — ABNORMAL HIGH (ref 0.0–1.2)
Total Protein: 6.2 g/dL — ABNORMAL LOW (ref 6.5–8.1)

## 2024-01-22 LAB — HIV ANTIBODY (ROUTINE TESTING W REFLEX): HIV Screen 4th Generation wRfx: NONREACTIVE

## 2024-01-22 LAB — TRIGLYCERIDES: Triglycerides: 48 mg/dL (ref <150)

## 2024-01-22 LAB — LIPASE, BLOOD: Lipase: 169 U/L — ABNORMAL HIGH (ref 11–51)

## 2024-01-22 MED ORDER — PREDNISONE 20 MG PO TABS: ORAL_TABLET | ORAL | 0 refills | Status: AC

## 2024-01-22 NOTE — Discharge Summary (Signed)
 Physician Discharge Summary   Patient: Travis Mcdaniel MRN: [id number] DOB: 1971/02/12  Admit date:     01/21/2024  Discharge date: 01/22/24  Discharge Physician: Carliss LELON Canales   PCP: [name], MD   Recommendations at discharge:    Pt to be discharged home.   If you experience worsening fever, chills, chest pain, shortness of breath, or other concerning symptoms, please call your PCP or go to the emergency department immediately.  Discharge Diagnoses: Principal Problem:   Acute pancreatitis Active Problems:   Hyperbilirubinemia   Paroxysmal nocturnal hemoglobinuria (PNH) (HCC)   Elevated LFTs   Thrombocytopenia (HCC)   Hypertension   Paroxysmal nocturnal hemoglobinuria (HCC)   Alcohol abuse  Resolved Problems:   * No resolved hospital problems. *   Hospital Course:  53 y.o. male with medical history significant of alcohol abuse, anxiety, paroxysmal nocturnal hemoglobinuria, hemolytic anemia, thrombocytopenia, hyperbilirubinemia, hypertension followed by hem-onc who presented to the emergency department with nausea, multiple episodes of emesis and hematuria.  He stated he does not drink daily.  He last used alcohol on Saturday when he had 4 drinks.  To his knowledge he does not have hypertriglyceridemia.  He uses hydrochlorothiazide . No diarrhea, constipation, melena or hematochezia.  No flank pain, dysuria or frequency. He denied fever, chills, rhinorrhea, sore throat, wheezing or hemoptysis.  No chest pain, palpitations, diaphoresis, PND, orthopnea or pitting edema of the lower extremities.   No polyuria, polydipsia, polyphagia or blurred vision.    Lab work: Urinalysis was normal.  CBC showed a white count of 11.9, hemoglobin 14.3 g/dL with an MCV of 1 of 87.1 fL and platelets 118.  Lipase was 547 units/L.  Lipase was 547.  CMP showed a potassium of 3.4 and chloride 97 mmol/L, the rest of the electrolytes were normal.  Glucose 135, BUN 22, creatinine 1.03 and total  bilirubin 7.5 mg/dL.  AST was 94 and ALT 153 units/L.  Normal alk phos, total protein and albumin level.   Imaging: CT abdomen/pelvis with contrast showing mild acute pancreatitis.  No definite pseudocyst formation.  Mild gallbladder wall thickening without cholelithiasis.  RUQ ultrasound recommended.  Sigmoid diverticulosis without inflammation.  Aortic atherosclerosis.   ED course: Initial vital signs were temperature 98.3 F, pulse 92, respirations 16, BP 168/89 mmHg O2 sat 100% on room air.  The patient received 1000 mL normal saline bolus, pantoprazole  40 mg IVP and ondansetron  4 mg IVP.  Assessment and Plan:  Likely alcohol induced pancreatitis -CT showing pancreatic inflammation.  Evaluated by GI and oncology.  Unlikely related to PNH, more likely alcohol induced.  Responded well to aggressive IV fluids.  Symptoms resolved at this time.  GI recommending abstaining from alcohol.  Will recommend outpatient GI referral.   Paroxsymal Nocturnal Hemoglobinuria -Mild possibility of contributing to above.  Pt requesting prednisone  taper.  Will order prednisone  taper to be taken as directed, then resume normal 5mg  daily therafter.  Follow with hematology in outpatient setting.   Alcohol abuse -encouraged cessation.  Pt stating he's going to cut back and make changes.    Consultants: hematology, GI Procedures performed: none  Disposition: Home Diet recommendation:  Discharge Diet Orders (From admission, onward)     Start     Ordered   01/22/24 0000  Diet - low sodium heart healthy        01/22/24 0924           Cardiac diet  DISCHARGE MEDICATION: Allergies as of 01/22/2024  Reactions   Nsaids Other (See Comments)   Due to the type of Cancer the Patient has, he can't take NSAIDS. Bone Marrow Cancer        Medication List     TAKE these medications    ALPRAZolam  1 MG tablet Commonly known as: XANAX  Take 1 mg by mouth 3 (three) times daily as needed for anxiety.    aspirin  EC 81 MG tablet Take 81 mg by mouth in the morning. Swallow whole.   Eliquis  2.5 MG Tabs tablet Generic drug: apixaban  TAKE 1 TABLET BY MOUTH TWICE A DAY   ferrous sulfate  325 (65 FE) MG EC tablet Take 325 mg by mouth every other day.   folic acid  800 MCG tablet Commonly known as: FOLVITE  Take 800 mcg by mouth daily.   lisinopril -hydrochlorothiazide  20-25 MG tablet Commonly known as: ZESTORETIC  Take 1 tablet by mouth daily.   METAMUCIL PO Take 4 g by mouth See admin instructions. Mix 4 grams (1 teaspoonful) into a glass of water and drink by mouth once a day   MULTIVITAMIN ADULT PO Take 1 tablet by mouth daily with breakfast.   oxyCODONE  5 MG immediate release tablet Commonly known as: Oxy IR/ROXICODONE  Take 5 mg by mouth daily as needed (for pain).   Potassium 99 MG Tabs Take 99 mg by mouth daily.   predniSONE  5 MG tablet Commonly known as: DELTASONE  Take 1 tablet (5 mg total) by mouth daily with breakfast. What changed: Another medication with the same name was added. Make sure you understand how and when to take each.   predniSONE  20 MG tablet Commonly known as: DELTASONE  Take 4 tablets (80 mg total) by mouth daily with breakfast for 7 days, THEN 3 tablets (60 mg total) daily with breakfast for 7 days, THEN 2 tablets (40 mg total) daily with breakfast for 7 days, THEN 1 tablet (20 mg total) daily with breakfast for 7 days, THEN 0.5 tablets (10 mg total) daily with breakfast for 7 days. Start taking on: January 22, 2024 What changed: You were already taking a medication with the same name, and this prescription was added. Make sure you understand how and when to take each.   tetrahydrozoline 0.05 % ophthalmic solution Place 1 drop into both eyes 2 (two) times daily as needed (eye irritation).   Vitamin D3 125 MCG (5000 UT) Chew Chew 5,000 Units by mouth daily.         Discharge Exam: Filed Weights   01/21/24 1127  Weight: 76.7 kg    Physical  Exam GENERAL:  Alert, pleasant, no acute distress  HEENT:  EOMI CARDIOVASCULAR:  RRR, no murmurs appreciated RESPIRATORY:  Clear to auscultation, no wheezing, rales, or rhonchi GASTROINTESTINAL:  Soft, nontender, nondistended EXTREMITIES:  No LE edema bilaterally NEURO:  No new focal deficits appreciated SKIN:  No rashes noted PSYCH:  Appropriate mood and affect     Condition at discharge: improving  The results of significant diagnostics from this hospitalization (including imaging, microbiology, ancillary and laboratory) are listed below for reference.   Imaging Studies: US  Abdomen Limited RUQ (LIVER/GB) Addendum Date: 01/21/2024 ADDENDUM REPORT: 01/21/2024 16:44 ADDENDUM: The original report was by Dr. Ryan Salvage. The following addendum is by Dr. Ryan Salvage: Communication with the sonographer was established and sonographic Murphy sign was ABSENT during this exam. Electronically Signed   By: Ryan Salvage M.D.   On: 01/21/2024 16:44   Result Date: 01/21/2024 CLINICAL DATA:  Abdominal pain, acute pancreatitis with elevated lipase. EXAM: ULTRASOUND  ABDOMEN LIMITED RIGHT UPPER QUADRANT COMPARISON:  01/21/2024 CT scan FINDINGS: Gallbladder: Abnormal gallbladder wall thickening up to 7 mm. Nondistended gallbladder, cannot exclude gallbladder contraction. No gallstones identified. Sonographic Murphy's sign not documented as being present or absent. Common bile duct: Diameter: 0.2 cm Liver: Coarse echogenic liver with poor sonic penetration compatible with diffuse hepatic steatosis. Small benign left hepatic lobe cysts are not substantially changed from the recent CT scan of 01/21/2024 and do not warrant any further workup. Portal vein is patent on color Doppler imaging with normal direction of blood flow towards the liver. Other: None. IMPRESSION: 1. Abnormal gallbladder wall thickening up to 7 mm, but the gallbladder is nondistended hence may be contracted. No gallstones  identified. Sonographic Murphy's sign not documented as being present or absent. Correlate with clinical scenario in assessing for acute cholecystitis. If there remains clinical uncertainty, nuclear medicine hepatobiliary scan could be utilized to check for cystic duct patency. 2. Diffuse hepatic steatosis. 3. Small benign left hepatic lobe cysts are not substantially changed from the recent CT scan of 01/21/2024 and do not warrant any further workup. Electronically Signed: By: Ryan Salvage M.D. On: 01/21/2024 16:35   CT ABDOMEN PELVIS W CONTRAST Result Date: 01/21/2024 CLINICAL DATA:  Vomiting, gross hematuria. EXAM: CT ABDOMEN AND PELVIS WITH CONTRAST TECHNIQUE: Multidetector CT imaging of the abdomen and pelvis was performed using the standard protocol following bolus administration of intravenous contrast. RADIATION DOSE REDUCTION: This exam was performed according to the departmental dose-optimization program which includes automated exposure control, adjustment of the mA and/or kV according to patient size and/or use of iterative reconstruction technique. CONTRAST:  OMNIPAQUE  IOHEXOL  300 MG/ML  SOLN COMPARISON:  January 12, 2023. FINDINGS: Lower chest: No acute abnormality. Hepatobiliary: No cholelithiasis is noted, but mild gallbladder wall thickening is noted. No biliary dilatation. Left hepatic cysts are noted. Pancreas: Mild inflammatory changes are noted around the pancreatic body and tail suggesting acute pancreatitis. No pseudocyst formation or ductal dilatation is noted. Spleen: Normal in size without focal abnormality. Adrenals/Urinary Tract: Adrenal glands are unremarkable. Kidneys are normal, without renal calculi, focal lesion, or hydronephrosis. Bladder is unremarkable. Stomach/Bowel: Stomach is within normal limits. Appendix appears normal. No evidence of bowel wall thickening, distention, or inflammatory changes. Sigmoid diverticulosis without inflammation. Vascular/Lymphatic: Aortic  atherosclerosis. No enlarged abdominal or pelvic lymph nodes. Reproductive: Prostate is unremarkable. Other: No ascites or hernia is noted. Musculoskeletal: No acute or significant osseous findings. IMPRESSION: Findings consistent with mild acute pancreatitis. No definite pseudocyst formation is noted. Mild gallbladder wall thickening is noted without cholelithiasis. Ultrasound is recommended for further evaluation. Sigmoid diverticulosis without inflammation. Aortic Atherosclerosis (ICD10-I70.0). Electronically Signed   By: Lynwood Landy Raddle M.D.   On: 01/21/2024 14:10    Microbiology: Results for orders placed or performed in visit on 02/27/15  TECHNOLOGIST REVIEW     Status: None   Collection Time: 02/27/15  8:03 AM  Result Value Ref Range Status   Technologist Review Rare nRBC present  Final    Labs: CBC: Recent Labs  Lab 01/19/24 0741 01/21/24 1133 01/22/24 0528  WBC 5.6 11.9* 5.9  NEUTROABS 4.2 11.0*  --   HGB 13.7 14.3 11.1*  HCT 37.2* 42.3 32.0*  MCV 109.7* 112.8* 116.4*  PLT 107* 118* 86*   Basic Metabolic Panel: Recent Labs  Lab 01/19/24 0741 01/21/24 1133 01/22/24 0528  NA 133* 135 134*  K 3.6 3.4* 3.9  CL 99 97* 103  CO2 25 24 24   GLUCOSE 108* 135*  133*  BUN 15 22* 16  CREATININE 0.96 1.03 0.62  CALCIUM 9.2 9.2 8.3*   Liver Function Tests: Recent Labs  Lab 01/19/24 0741 01/21/24 1133 01/22/24 0528  AST 143* 94* 51*  ALT 152* 153* 101*  ALKPHOS 52 59 46  BILITOT 3.9* 7.5* 5.1*  PROT 7.0 7.7 6.2*  ALBUMIN 4.3 4.5 3.5   CBG: No results for input(s): GLUCAP in the last 168 hours.  Discharge time spent: 26 minutes.  Length of inpatient stay: 0 days  Signed: Carliss LELON Canales, DO [facility] 01/22/2024

## 2024-01-22 NOTE — Consult Note (Signed)
 Mr. Travis Mcdaniel is a very nice 53 year old white male.  He is followed by Dr. Sherrod.  He has PNH.  He has had this for several years.  He has had this very well-controlled with Ultomiris .  He has had Ultomiris  for I think 33 cycles.  He initially was treated with Solaris.  He was last seen by Dr. Sherrod on 11/25/2023.  I think he received Ultomiris  on 01/19/2024.  He had no problems with the Ultomiris .  However, a couple days later, he began to have a lot of abdominal pain.  He had vomiting.  When he had his treatment, his white cell count was 5.6.  Hemoglobin 13.7.  Platelet count 107,000.  When he was sent to the ER on 01/21/2024, his white cell count was 11.9.  Hemoglobin 14.3.  Platelet count 118,000.  His electrolytes all were abnormal.  On the 23rd, his total bilirubin was 3.9.  SGPT 152 SGOT 143.  On 25 July, his bilirubin was 7.5.  SGPT 153 SGOT 94.  Lipase was 547.  Glucose was 135.  He does have a history of heavy alcohol use.  He said he last drank last Saturday. He had a CT scan that was done on 01/21/2024.  This showed acute pancreatitis.  He had gallbladder wall thickening.  There is no cholelithiasis.  An ultrasound that was done on the 25th.  This did show some gallbladder wall thickening.  He had hepatic steatosis.  He was given IV fluids.  He says he feels better.  He has been having I think clear liquids.  He has had no diarrhea.  He has had no bleeding.  He says there is no blood in the urine.  He has had no fever.  There has been no cough or shortness of breath.   His vital signs show temperature of 98.3.  Pulse 87.  Blood pressure 140/70.  Head neck exam shows some scleral icterus.  He has no adenopathy in the neck.  There is no oral lesions.  His lungs are clear bilaterally.  Cardiac exam regular rate and rhythm.  He has no murmurs, rubs or bruits.  Abdominal exam is somewhat obese.  Abdomen is soft.  He has no rebound tenderness.  There may be a little bit of tenderness  to palpation in the epigastric area.  I cannot palpate a fluid wave.  There is no palpable liver or spleen tip.  Extremities shows no clubbing, cyanosis or edema.  Neurological exam shows no focal neurological deficits.    Mr. Lasser is a very nice 52 year old white male.  He has pancreatitis.  Again, I do not think this is anything related to him having PNH nor to the Ultomiris .  I am sure that there are probably case reports out there but I have to believe that this is all related to his gallbladder and possibly to his alcohol ingestion.  The problem that he has right now is that he has to be home.  He says he has animals that he has take care of.  I think that his home might be left open.  He really is worried about this.  Again, I do not know if his gallbladder needs to come out.  There is no evidence of cholecystitis on the scans.  I am sure that no gastroenterology will help out with this.  Again, I do not believe this is anything related to him having PNH nor to the Ultomiris .  He has had a lot of Ultomiris   without any issues.  At least, his labs are doing better.  His lipase is coming down nicely.  He feels better.  I do not know if his diet will be able to be increased.  We will follow along.  Again, he says he must go home today.  Hopefully this will happen.    Jeralyn Crease, MD  [place] 1:5

## 2024-01-25 ENCOUNTER — Encounter: Payer: Self-pay | Admitting: Internal Medicine

## 2024-03-06 ENCOUNTER — Encounter: Payer: Self-pay | Admitting: Internal Medicine

## 2024-03-14 ENCOUNTER — Other Ambulatory Visit: Payer: Self-pay | Admitting: Medical Oncology

## 2024-03-14 DIAGNOSIS — D595 Paroxysmal nocturnal hemoglobinuria [Marchiafava-Micheli]: Secondary | ICD-10-CM

## 2024-03-14 NOTE — Progress Notes (Signed)
 Lab orders entered

## 2024-03-15 ENCOUNTER — Inpatient Hospital Stay: Attending: Internal Medicine

## 2024-03-15 ENCOUNTER — Other Ambulatory Visit: Payer: Self-pay | Admitting: Medical Oncology

## 2024-03-15 ENCOUNTER — Inpatient Hospital Stay

## 2024-03-15 ENCOUNTER — Inpatient Hospital Stay (HOSPITAL_BASED_OUTPATIENT_CLINIC_OR_DEPARTMENT_OTHER): Admitting: Internal Medicine

## 2024-03-15 VITALS — BP 119/74 | HR 87

## 2024-03-15 DIAGNOSIS — D595 Paroxysmal nocturnal hemoglobinuria [Marchiafava-Micheli]: Secondary | ICD-10-CM | POA: Diagnosis present

## 2024-03-15 LAB — CMP (CANCER CENTER ONLY)
ALT: 143 U/L — ABNORMAL HIGH (ref 0–44)
AST: 121 U/L — ABNORMAL HIGH (ref 15–41)
Albumin: 4.5 g/dL (ref 3.5–5.0)
Alkaline Phosphatase: 59 U/L (ref 38–126)
Anion gap: 10 (ref 5–15)
BUN: 13 mg/dL (ref 6–20)
CO2: 25 mmol/L (ref 22–32)
Calcium: 9.3 mg/dL (ref 8.9–10.3)
Chloride: 101 mmol/L (ref 98–111)
Creatinine: 0.93 mg/dL (ref 0.61–1.24)
GFR, Estimated: >60 mL/min (ref >60)
Glucose, Bld: 106 mg/dL — ABNORMAL HIGH (ref 70–99)
Potassium: 3.7 mmol/L (ref 3.5–5.1)
Sodium: 136 mmol/L (ref 135–145)
Total Bilirubin: 3.3 mg/dL — ABNORMAL HIGH (ref 0.0–1.2)
Total Protein: 7 g/dL (ref 6.5–8.1)

## 2024-03-15 LAB — CBC WITH DIFFERENTIAL (CANCER CENTER ONLY)
Abs Immature Granulocytes: 0.05 K/uL (ref 0.00–0.07)
Basophils Absolute: 0 K/uL (ref 0.0–0.1)
Basophils Relative: 1 %
Eosinophils Absolute: 0.1 K/uL (ref 0.0–0.5)
Eosinophils Relative: 1 %
HCT: 35.1 % — ABNORMAL LOW (ref 39.0–52.0)
Hemoglobin: 13.1 g/dL (ref 13.0–17.0)
Immature Granulocytes: 1 %
Lymphocytes Relative: 18 %
Lymphs Abs: 1.3 K/uL (ref 0.7–4.0)
MCH: 39.1 pg — ABNORMAL HIGH (ref 26.0–34.0)
MCHC: 37.3 g/dL — ABNORMAL HIGH (ref 30.0–36.0)
MCV: 104.8 fL — ABNORMAL HIGH (ref 80.0–100.0)
Monocytes Absolute: 0.7 K/uL (ref 0.1–1.0)
Monocytes Relative: 10 %
Neutro Abs: 5.1 K/uL (ref 1.7–7.7)
Neutrophils Relative %: 69 %
Platelet Count: 122 K/uL — ABNORMAL LOW (ref 150–400)
RBC: 3.35 MIL/uL — ABNORMAL LOW (ref 4.22–5.81)
RDW: 15.5 % (ref 11.5–15.5)
WBC Count: 7.3 K/uL (ref 4.0–10.5)
nRBC: 0 % (ref 0.0–0.2)

## 2024-03-15 LAB — LACTATE DEHYDROGENASE: LDH: 257 U/L — ABNORMAL HIGH (ref 98–192)

## 2024-03-15 MED ORDER — SODIUM CHLORIDE 0.9 % IV SOLN: Freq: Once | INTRAVENOUS | Status: AC

## 2024-03-15 MED ORDER — SODIUM CHLORIDE 0.9 % IV SOLN
3300.0000 mg | Freq: Once | INTRAVENOUS | Status: AC
  Administered 2024-03-15: 3300 mg via INTRAVENOUS
  Filled 2024-03-15: qty 33

## 2024-03-15 MED ORDER — PREDNISONE 5 MG PO TABS: 5.0000 mg | ORAL_TABLET | Freq: Every day | ORAL | 0 refills | Status: DC

## 2024-03-15 NOTE — Progress Notes (Signed)
 [facility] [phone]   [phone]  OFFICE PROGRESS NOTE  [name], MD [address] 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  PRIOR THERAPY:  Soliris  (Eculizumab ) initially with induction 600 MG IV weekly for 4 weeks and then switched to maintenance treatment 900 MG IV every 2 weeks. First dose of treatment was given on 11/21/2014. Status post 45 cycles.  After cycle #45 the patient received his treatment at outside infusion center.  Status post 16 more cycles of treatment.  This was discontinued secondary to worsening symptoms and lack of control of his disease.  CURRENT THERAPY: Ultomiris  (Ravulizumab ) loading dose 2700 mg/M2 followed by maintenance dose 3300 mg/M2 every 8 weeks.  First dose of treatment September 22, 2018.  He status post the induction dose of the treatment as well as 33  cycles of maintenance therapy.  The first 3 cycles of his treatment were given at the infusion center in [place] .  Starting from cycle #18 the patient will receive his treatment at the [facility] health cancer [facility].  INTERVAL HISTORY: Travis Mcdaniel 53 y.o. male returns to the clinic today for follow-up visit. Discussed the use of AI scribe software for clinical note transcription with the patient, who gave verbal consent to proceed.  History of Present Illness Travis Mcdaniel is a 53 year old male who presents for follow-up after an infusion.  He is on a steroid regimen with a dose of 5 mg. He notes facial puffiness but has experienced weight loss, decreasing from 179-180 lbs to 176.5 lbs. Two days post-infusion, he experienced bleeding issues, elevated bilirubin levels, and hematuria. Recent blood work shows a hemoglobin of 13.1, white blood count of 7.3, and platelets at 122,000.  He recalls a previous consideration for gallbladder removal  due to fluctuating bilirubin levels, which eventually normalized, leading to discharge without surgery.  He was admitted to the hospital 2 days after the last infusion was acute pancreatitis and hyperbilirubinemia.  He adheres to a diet avoiding fried foods, though he occasionally lapses. He has significantly reduced alcohol intake, abstaining for 30 days and now consuming minimal amounts, such as three beers on a Sunday.  He recalls that he was previously advised to undergo a colonoscopy and possibly an endoscopy, but insurance has denied coverage for these procedures. He has had a colonoscopy before.   MEDICAL HISTORY: Past Medical History:  Diagnosis Date   Cancer (HCC)    PNH (Bone marrow)   Diverticulosis    Encounter for antineoplastic chemotherapy 01/15/2016   Hypertension     ALLERGIES:  is allergic to nsaids.  MEDICATIONS:  Current Outpatient Medications  Medication Sig Dispense Refill   ALPRAZolam  (XANAX ) 1 MG tablet Take 1 mg by mouth 3 (three) times daily as needed for anxiety.  2   aspirin  EC 81 MG tablet Take 81 mg by mouth in the morning. Swallow whole.     Cholecalciferol (VITAMIN D3) 125 MCG (5000 UT) CHEW Chew 5,000 Units by mouth daily.     ELIQUIS  2.5 MG TABS tablet TAKE 1 TABLET BY MOUTH TWICE A DAY (Patient not taking: Reported on 01/21/2024) 60 tablet 2   ferrous sulfate  325 (65 FE) MG EC tablet Take 325 mg by mouth every other day.     folic acid  (FOLVITE ) 800 MCG tablet Take 800 mcg by mouth daily.     lisinopril -hydrochlorothiazide  (PRINZIDE ,ZESTORETIC ) 20-25  MG tablet Take 1 tablet by mouth daily.     Multiple Vitamins-Minerals (MULTIVITAMIN ADULT PO) Take 1 tablet by mouth daily with breakfast.     oxyCODONE  (OXY IR/ROXICODONE ) 5 MG immediate release tablet Take 5 mg by mouth daily as needed (for pain).     Potassium 99 MG TABS Take 99 mg by mouth daily.     predniSONE  (DELTASONE ) 5 MG tablet Take 1 tablet (5 mg total) by mouth daily with breakfast. 60 tablet 0    Psyllium (METAMUCIL PO) Take 4 g by mouth See admin instructions. Mix 4 grams (1 teaspoonful) into a glass of water and drink by mouth once a day     tetrahydrozoline 0.05 % ophthalmic solution Place 1 drop into both eyes 2 (two) times daily as needed (eye irritation).     No current facility-administered medications for this visit.    SURGICAL HISTORY: No past surgical history on file.  REVIEW OF SYSTEMS:  Constitutional: negative Eyes: negative Ears, nose, mouth, throat, and face: negative Respiratory: negative Cardiovascular: negative Gastrointestinal: negative Genitourinary:negative Integument/breast: negative Hematologic/lymphatic: negative Musculoskeletal:negative Neurological: negative Behavioral/Psych: negative Endocrine: negative Allergic/Immunologic: negative   PHYSICAL EXAMINATION: General appearance: alert, cooperative, and no distress Head: Normocephalic, without obvious abnormality, atraumatic Neck: no adenopathy, no JVD, supple, symmetrical, trachea midline, and thyroid not enlarged, symmetric, no tenderness/mass/nodules Lymph nodes: Cervical, supraclavicular, and axillary nodes normal. Resp: clear to auscultation bilaterally Back: symmetric, no curvature. ROM normal. No CVA tenderness. Cardio: regular rate and rhythm, S1, S2 normal, no murmur, click, rub or gallop GI: soft, non-tender; bowel sounds normal; no masses,  no organomegaly Extremities: extremities normal, atraumatic, no cyanosis or edema Neurologic: Alert and oriented X 3, normal strength and tone. Normal symmetric reflexes. Normal coordination and gait  ECOG PERFORMANCE STATUS: 1 - Symptomatic but completely ambulatory  Blood pressure 122/74, pulse 89, temperature 97.7 F (36.5 C), resp. rate 17, height 5' 9 (1.753 m), weight 176 lb 8 oz (80.1 kg), SpO2 99%.  LABORATORY DATA: Lab Results  Component Value Date   WBC 7.3 03/15/2024   HGB 13.1 03/15/2024   HCT 35.1 (L) 03/15/2024   MCV 104.8  (H) 03/15/2024   PLT 122 (L) 03/15/2024      Chemistry      Component Value Date/Time   NA 134 (L) 01/22/2024 0528   NA 136 06/28/2017 1236   K 3.9 01/22/2024 0528   K 4.1 06/28/2017 1236   CL 103 01/22/2024 0528   CO2 24 01/22/2024 0528   CO2 24 06/28/2017 1236   BUN 16 01/22/2024 0528   BUN 17.1 06/28/2017 1236   CREATININE 0.62 01/22/2024 0528   CREATININE 0.96 01/19/2024 0741   CREATININE 1.0 06/28/2017 1236      Component Value Date/Time   CALCIUM 8.3 (L) 01/22/2024 0528   CALCIUM 9.1 06/28/2017 1236   ALKPHOS 46 01/22/2024 0528   ALKPHOS 66 06/28/2017 1236   AST 51 (H) 01/22/2024 0528   AST 143 (H) 01/19/2024 0741   AST 40 (H) 06/28/2017 1236   ALT 101 (H) 01/22/2024 0528   ALT 152 (H) 01/19/2024 0741   ALT 60 (H) 06/28/2017 1236   BILITOT 5.1 (H) 01/22/2024 0528   BILITOT 3.9 (HH) 01/19/2024 0741   BILITOT 2.87 (H) 06/28/2017 1236       RADIOGRAPHIC STUDIES: No results found.  ASSESSMENT AND PLAN:  This is a very pleasant 53 years old white male with paroxysmal nocturnal hemoglobinuria and currently on treatment with Soliris  on days 1 and  15 every 4 weeks status post 45 cycles.  He is also on the same treatment received at outside infusion center status post 19 more cycles. He also completed a course of tapered dose of prednisone . He is currently on treatment with Ultomiris  status post induction dose followed by 33 cycles of maintenance therapy.  He was receiving his treatment at the [place] infusion center but because of significant lack of payment they declined to give him the infusions there anymore.  Starting from cycle #18 he received his treatment at the [facility] in [place]. Assessment and Plan Assessment & Plan Hematologic malignancy Currently on active treatment with stable blood counts: hemoglobin 13.1, WBC 7.3, platelets 122,000. Awaiting chemistry and LDH results for further assessment. - Continue current treatment regimen -  Review chemistry and LDH results when available  Steroid-induced Cushingoid features Puffiness in the face likely due to steroid use, currently on a reduced dose of 5 mg. - Continue current steroid dose  Resolved elevated bilirubin and hematuria Previous episode resolved. Possible causes included gallbladder issues, alcohol use, or hemolysis. - Monitor bilirubin levels - Evaluate for recurrence if symptoms return  Alcohol use Alcohol consumption significantly reduced, previously abstained for 30 days, currently minimal consumption (e.g., three beers on a Sunday). - Encourage continued reduction in alcohol consumption  Screening for colorectal cancer Screening colonoscopy recommended but previously denied by insurance. He is 53 years old and should have a screening colonoscopy as per guidelines. - Refer to gastroenterology for colonoscopy - Coordinate with insurance for coverage of screening colonoscopy He was advised to call immediately if he has any other concerning symptoms in the interval.  The patient voices understanding of current disease status and treatment options and is in agreement with the current care plan. All questions were answered. The patient knows to call the clinic with any problems, questions or concerns. We can certainly see the patient much sooner if necessary.  Disclaimer: This note was dictated with voice recognition software. Similar sounding words can inadvertently be transcribed and may not be corrected upon review.

## 2024-03-15 NOTE — Telephone Encounter (Signed)
 Requested refill for Prednisone .

## 2024-03-15 NOTE — Progress Notes (Signed)
 Patient declined post Ultomiris  observation.  Tolerated treatment well without incident.  VSS at discharge.  Ambulated to lobby.

## 2024-03-15 NOTE — Patient Instructions (Addendum)
 [facility] - A DEPT OF [facility]  Discharge Instructions: Thank you for choosing [facility] to provide your oncology and hematology care.   If you have a lab appointment with the Cancer Center, please go directly to the Cancer Center and check in at the registration area.   Wear comfortable clothing and clothing appropriate for easy access to any Portacath or PICC line.   We strive to give you quality time with your provider. You may need to reschedule your appointment if you arrive late (15 or more minutes).  Arriving late affects you and other patients whose appointments are after yours.  Also, if you miss three or more appointments without notifying the office, you may be dismissed from the clinic at the provider's discretion.      For prescription refill requests, have your pharmacy contact our office and allow 72 hours for refills to be completed.    Today you received the following chemotherapy and/or immunotherapy agents: Ultomiris      To help prevent nausea and vomiting after your treatment, we encourage you to take your nausea medication as directed.  BELOW ARE SYMPTOMS THAT SHOULD BE REPORTED IMMEDIATELY: *FEVER GREATER THAN 100.4 F (38 C) OR HIGHER *CHILLS OR SWEATING *NAUSEA AND VOMITING THAT IS NOT CONTROLLED WITH YOUR NAUSEA MEDICATION *UNUSUAL SHORTNESS OF BREATH *UNUSUAL BRUISING OR BLEEDING *URINARY PROBLEMS (pain or burning when urinating, or frequent urination) *BOWEL PROBLEMS (unusual diarrhea, constipation, pain near the anus) TENDERNESS IN MOUTH AND THROAT WITH OR WITHOUT PRESENCE OF ULCERS (sore throat, sores in mouth, or a toothache) UNUSUAL RASH, SWELLING OR PAIN  UNUSUAL VAGINAL DISCHARGE OR ITCHING   Items with * indicate a potential emergency and should be followed up as soon as possible or go to the Emergency Department if any problems should occur.  Please show the CHEMOTHERAPY ALERT CARD or IMMUNOTHERAPY  ALERT CARD at check-in to the Emergency Department and triage nurse.  Should you have questions after your visit or need to cancel or reschedule your appointment, please contact [facility] - A DEPT OF [facility]  Dept: [phone]  and follow the prompts.  Office hours are 8:00 a.m. to 4:30 p.m. Monday - Friday. Please note that voicemails left after 4:00 p.m. may not be returned until the following business day.  We are closed weekends and major holidays. You have access to a nurse at all times for urgent questions. Please call the main number to the clinic Dept: [phone] and follow the prompts.   For any non-urgent questions, you may also contact your provider using MyChart. We now offer e-Visits for anyone 72 and older to request care online for non-urgent symptoms. For details visit [email or web address].   Also download the MyChart app! Go to the app store, search "MyChart", open the app, select [facility], and log in with your MyChart username and password.

## 2024-03-24 ENCOUNTER — Telehealth: Payer: Self-pay | Admitting: Medical Oncology

## 2024-03-24 NOTE — Telephone Encounter (Signed)
 Asking about Prednisone  refill. I told him it was sent 09/17 and received by  his pharmacy.

## 2024-05-10 ENCOUNTER — Inpatient Hospital Stay (HOSPITAL_BASED_OUTPATIENT_CLINIC_OR_DEPARTMENT_OTHER): Admitting: Internal Medicine

## 2024-05-10 ENCOUNTER — Inpatient Hospital Stay: Attending: Internal Medicine

## 2024-05-10 ENCOUNTER — Encounter: Payer: Self-pay | Admitting: Internal Medicine

## 2024-05-10 ENCOUNTER — Telehealth: Payer: Self-pay | Admitting: *Deleted

## 2024-05-10 ENCOUNTER — Inpatient Hospital Stay

## 2024-05-10 VITALS — BP 129/75 | HR 76 | Temp 97.8°F | Resp 16

## 2024-05-10 VITALS — BP 113/66 | HR 80 | Temp 97.7°F | Resp 17 | Ht 69.0 in | Wt 172.0 lb

## 2024-05-10 DIAGNOSIS — D595 Paroxysmal nocturnal hemoglobinuria [Marchiafava-Micheli]: Secondary | ICD-10-CM

## 2024-05-10 DIAGNOSIS — Z7952 Long term (current) use of systemic steroids: Secondary | ICD-10-CM | POA: Insufficient documentation

## 2024-05-10 LAB — CMP (CANCER CENTER ONLY)
ALT: 209 U/L — ABNORMAL HIGH (ref 0–44)
AST: 151 U/L — ABNORMAL HIGH (ref 15–41)
Albumin: 4.5 g/dL (ref 3.5–5.0)
Alkaline Phosphatase: 48 U/L (ref 38–126)
Anion gap: 10 (ref 5–15)
BUN: 12 mg/dL (ref 6–20)
CO2: 25 mmol/L (ref 22–32)
Calcium: 9.5 mg/dL (ref 8.9–10.3)
Chloride: 97 mmol/L — ABNORMAL LOW (ref 98–111)
Creatinine: 0.89 mg/dL (ref 0.61–1.24)
GFR, Estimated: >60 mL/min (ref >60)
Glucose, Bld: 129 mg/dL — ABNORMAL HIGH (ref 70–99)
Potassium: 3.6 mmol/L (ref 3.5–5.1)
Sodium: 132 mmol/L — ABNORMAL LOW (ref 135–145)
Total Bilirubin: 3.9 mg/dL (ref 0.0–1.2)
Total Protein: 7 g/dL (ref 6.5–8.1)

## 2024-05-10 LAB — CBC WITH DIFFERENTIAL (CANCER CENTER ONLY)
Abs Immature Granulocytes: 0.01 K/uL (ref 0.00–0.07)
Basophils Absolute: 0 K/uL (ref 0.0–0.1)
Basophils Relative: 1 %
Eosinophils Absolute: 0.1 K/uL (ref 0.0–0.5)
Eosinophils Relative: 1 %
HCT: 35.9 % — ABNORMAL LOW (ref 39.0–52.0)
Hemoglobin: 13.2 g/dL (ref 13.0–17.0)
Immature Granulocytes: 0 %
Lymphocytes Relative: 17 %
Lymphs Abs: 0.9 K/uL (ref 0.7–4.0)
MCH: 40.5 pg — ABNORMAL HIGH (ref 26.0–34.0)
MCHC: 36.8 g/dL — ABNORMAL HIGH (ref 30.0–36.0)
MCV: 110.1 fL — ABNORMAL HIGH (ref 80.0–100.0)
Monocytes Absolute: 0.5 K/uL (ref 0.1–1.0)
Monocytes Relative: 9 %
Neutro Abs: 3.9 K/uL (ref 1.7–7.7)
Neutrophils Relative %: 72 %
Platelet Count: 119 K/uL — ABNORMAL LOW (ref 150–400)
RBC: 3.26 MIL/uL — ABNORMAL LOW (ref 4.22–5.81)
RDW: 12.8 % (ref 11.5–15.5)
WBC Count: 5.4 K/uL (ref 4.0–10.5)
nRBC: 0 % (ref 0.0–0.2)

## 2024-05-10 LAB — LACTATE DEHYDROGENASE: LDH: 228 U/L (ref 105–235)

## 2024-05-10 MED ORDER — PREDNISONE 5 MG PO TABS: 5.0000 mg | ORAL_TABLET | Freq: Every day | ORAL | 0 refills | Status: DC

## 2024-05-10 MED ORDER — SODIUM CHLORIDE 0.9 % IV SOLN
3300.0000 mg | Freq: Once | INTRAVENOUS | Status: AC
  Administered 2024-05-10: 3300 mg via INTRAVENOUS
  Filled 2024-05-10: qty 33

## 2024-05-10 MED ORDER — SODIUM CHLORIDE 0.9 % IV SOLN: Freq: Once | INTRAVENOUS | Status: AC

## 2024-05-10 NOTE — Patient Instructions (Signed)
 [facility] - A DEPT OF [facility]  Discharge Instructions: Thank you for choosing [facility] to provide your oncology and hematology care.   If you have a lab appointment with the Cancer Center, please go directly to the Cancer Center and check in at the registration area.   Wear comfortable clothing and clothing appropriate for easy access to any Portacath or PICC line.   We strive to give you quality time with your provider. You may need to reschedule your appointment if you arrive late (15 or more minutes).  Arriving late affects you and other patients whose appointments are after yours.  Also, if you miss three or more appointments without notifying the office, you may be dismissed from the clinic at the provider's discretion.      For prescription refill requests, have your pharmacy contact our office and allow 72 hours for refills to be completed.    Today you received the following chemotherapy and/or immunotherapy agents: Ultomiris      To help prevent nausea and vomiting after your treatment, we encourage you to take your nausea medication as directed.  BELOW ARE SYMPTOMS THAT SHOULD BE REPORTED IMMEDIATELY: *FEVER GREATER THAN 100.4 F (38 C) OR HIGHER *CHILLS OR SWEATING *NAUSEA AND VOMITING THAT IS NOT CONTROLLED WITH YOUR NAUSEA MEDICATION *UNUSUAL SHORTNESS OF BREATH *UNUSUAL BRUISING OR BLEEDING *URINARY PROBLEMS (pain or burning when urinating, or frequent urination) *BOWEL PROBLEMS (unusual diarrhea, constipation, pain near the anus) TENDERNESS IN MOUTH AND THROAT WITH OR WITHOUT PRESENCE OF ULCERS (sore throat, sores in mouth, or a toothache) UNUSUAL RASH, SWELLING OR PAIN  UNUSUAL VAGINAL DISCHARGE OR ITCHING   Items with * indicate a potential emergency and should be followed up as soon as possible or go to the Emergency Department if any problems should occur.  Please show the CHEMOTHERAPY ALERT CARD or IMMUNOTHERAPY  ALERT CARD at check-in to the Emergency Department and triage nurse.  Should you have questions after your visit or need to cancel or reschedule your appointment, please contact [facility] - A DEPT OF [facility]  Dept: [phone]  and follow the prompts.  Office hours are 8:00 a.m. to 4:30 p.m. Monday - Friday. Please note that voicemails left after 4:00 p.m. may not be returned until the following business day.  We are closed weekends and major holidays. You have access to a nurse at all times for urgent questions. Please call the main number to the clinic Dept: [phone] and follow the prompts.   For any non-urgent questions, you may also contact your provider using MyChart. We now offer e-Visits for anyone 72 and older to request care online for non-urgent symptoms. For details visit [email or web address].   Also download the MyChart app! Go to the app store, search "MyChart", open the app, select [place], and log in with your MyChart username and password.

## 2024-05-10 NOTE — Telephone Encounter (Signed)
 CRITICAL VALUE STICKER  CRITICAL VALUE: T bili  RECEIVER (on-site recipient of call):Sandi K, RN  DATE & TIME NOTIFIED: 05/10/24; 9173  MESSENGER (representative from lab):Jessica  MD NOTIFIED: Dr. Sherrod  TIME OF NOTIFICATION:0830  RESPONSE: Info acknowledged

## 2024-05-10 NOTE — Progress Notes (Signed)
 [facility] [phone]   Fax:([phone]  OFFICE PROGRESS NOTE  [name], MD [address]. 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  PRIOR THERAPY:  Soliris  (Eculizumab ) initially with induction 600 MG IV weekly for 4 weeks and then switched to maintenance treatment 900 MG IV every 2 weeks. First dose of treatment was given on 11/21/2014. Status post 45 cycles.  After cycle #45 the patient received his treatment at outside infusion center.  Status post 16 more cycles of treatment.  This was discontinued secondary to worsening symptoms and lack of control of his disease.  CURRENT THERAPY: Ultomiris  (Ravulizumab ) loading dose 2700 mg/M2 followed by maintenance dose 3300 mg/M2 every 8 weeks.  First dose of treatment September 22, 2018.  He status post the induction dose of the treatment as well as 34 cycles of maintenance therapy.  The first 3 cycles of his treatment were given at the infusion center in [place] .  Starting from cycle #18 the patient will receive his treatment at the [facility] cancer [facility].  Starting from cycle #35 Ultomiris  was getting every 7 weeks.  INTERVAL HISTORY: Travis Mcdaniel 53 y.o. male returns to the clinic today for follow-up visit. Discussed the use of AI scribe software for clinical note transcription with the patient, who gave verbal consent to proceed.  History of Present Illness Travis Mcdaniel is a 53 year old male with paroxysmal nocturnal hemoglobinuria who presents for evaluation before starting cycle number 35 of Altomiris.  He was diagnosed with paroxysmal nocturnal hemoglobinuria in June 2016 and is currently receiving Altomiris every eight weeks. He experiences worsening symptoms about a week and a half before his scheduled treatment, including feeling 'rough' and noticing his hearing 'got  dark'.  He has been taking steroids, using an extra dose when feeling unwell, but wishes to avoid high doses due to potential side effects. He took 10 mg of steroids for three days last week.  Others have commented on his appearance, noting that he 'didn't look so good' last Thursday, which led him to stay home.  Recent blood work shows a hemoglobin level of 13.2 and a platelet count of 119.  He is trying to lose weight by cutting out sugar and reducing alcohol consumption, noting that alcohol has become too expensive. No smoking and no new symptoms are bothering him.    MEDICAL HISTORY: Past Medical History:  Diagnosis Date   Cancer (HCC)    PNH (Bone marrow)   Diverticulosis    Encounter for antineoplastic chemotherapy 01/15/2016   Hypertension     ALLERGIES:  is allergic to nsaids.  MEDICATIONS:  Current Outpatient Medications  Medication Sig Dispense Refill   ALPRAZolam  (XANAX ) 1 MG tablet Take 1 mg by mouth 3 (three) times daily as needed for anxiety.  2   aspirin  EC 81 MG tablet Take 81 mg by mouth in the morning. Swallow whole.     Cholecalciferol (VITAMIN D3) 125 MCG (5000 UT) CHEW Chew 5,000 Units by mouth daily.     ELIQUIS  2.5 MG TABS tablet TAKE 1 TABLET BY MOUTH TWICE A DAY (Patient not taking: Reported on 01/21/2024) 60 tablet 2   ferrous sulfate  325 (65 FE) MG EC tablet Take 325 mg by mouth every other day.     folic acid  (FOLVITE ) 800 MCG tablet Take 800 mcg by mouth daily.     lisinopril -hydrochlorothiazide  (  PRINZIDE ,ZESTORETIC ) 20-25 MG tablet Take 1 tablet by mouth daily.     Multiple Vitamins-Minerals (MULTIVITAMIN ADULT PO) Take 1 tablet by mouth daily with breakfast.     oxyCODONE  (OXY IR/ROXICODONE ) 5 MG immediate release tablet Take 5 mg by mouth daily as needed (for pain).     Potassium 99 MG TABS Take 99 mg by mouth daily.     predniSONE  (DELTASONE ) 5 MG tablet Take 1 tablet (5 mg total) by mouth daily with breakfast. 60 tablet 0   Psyllium (METAMUCIL PO)  Take 4 g by mouth See admin instructions. Mix 4 grams (1 teaspoonful) into a glass of water and drink by mouth once a day     tetrahydrozoline 0.05 % ophthalmic solution Place 1 drop into both eyes 2 (two) times daily as needed (eye irritation).     No current facility-administered medications for this visit.    SURGICAL HISTORY: No past surgical history on file.  REVIEW OF SYSTEMS:  Constitutional: positive for fatigue Eyes: positive for icterus Ears, nose, mouth, throat, and face: negative Respiratory: negative Cardiovascular: negative Gastrointestinal: negative Genitourinary:negative Integument/breast: negative Hematologic/lymphatic: negative Musculoskeletal:negative Neurological: negative Behavioral/Psych: negative Endocrine: negative Allergic/Immunologic: negative   PHYSICAL EXAMINATION: General appearance: alert, cooperative, and no distress Head: Normocephalic, without obvious abnormality, atraumatic Neck: no adenopathy, no JVD, supple, symmetrical, trachea midline, and thyroid not enlarged, symmetric, no tenderness/mass/nodules Lymph nodes: Cervical, supraclavicular, and axillary nodes normal. Resp: clear to auscultation bilaterally Back: symmetric, no curvature. ROM normal. No CVA tenderness. Cardio: regular rate and rhythm, S1, S2 normal, no murmur, click, rub or gallop GI: soft, non-tender; bowel sounds normal; no masses,  no organomegaly Extremities: extremities normal, atraumatic, no cyanosis or edema Neurologic: Alert and oriented X 3, normal strength and tone. Normal symmetric reflexes. Normal coordination and gait  ECOG PERFORMANCE STATUS: 1 - Symptomatic but completely ambulatory  Blood pressure 113/66, pulse 80, temperature 97.7 F (36.5 C), temperature source Temporal, resp. rate 17, height 5' 9 (1.753 m), weight 172 lb (78 kg), SpO2 99%.  LABORATORY DATA: Lab Results  Component Value Date   WBC 5.4 05/10/2024   HGB 13.2 05/10/2024   HCT 35.9 (L)  05/10/2024   MCV 110.1 (H) 05/10/2024   PLT 119 (L) 05/10/2024      Chemistry      Component Value Date/Time   NA 136 03/15/2024 0732   NA 136 06/28/2017 1236   K 3.7 03/15/2024 0732   K 4.1 06/28/2017 1236   CL 101 03/15/2024 0732   CO2 25 03/15/2024 0732   CO2 24 06/28/2017 1236   BUN 13 03/15/2024 0732   BUN 17.1 06/28/2017 1236   CREATININE 0.93 03/15/2024 0732   CREATININE 1.0 06/28/2017 1236      Component Value Date/Time   CALCIUM 9.3 03/15/2024 0732   CALCIUM 9.1 06/28/2017 1236   ALKPHOS 59 03/15/2024 0732   ALKPHOS 66 06/28/2017 1236   AST 121 (H) 03/15/2024 0732   AST 40 (H) 06/28/2017 1236   ALT 143 (H) 03/15/2024 0732   ALT 60 (H) 06/28/2017 1236   BILITOT 3.3 (H) 03/15/2024 0732   BILITOT 2.87 (H) 06/28/2017 1236       RADIOGRAPHIC STUDIES: No results found.  ASSESSMENT AND PLAN:  This is a very pleasant 52 years old white male with paroxysmal nocturnal hemoglobinuria and currently on treatment with Soliris  on days 1 and 15 every 4 weeks status post 45 cycles.  He is also on the same treatment received at outside infusion center status  post 19 more cycles. He also completed a course of tapered dose of prednisone . He is currently on treatment with Ultomiris  status post induction dose followed by 34 cycles of maintenance therapy.  He was receiving his treatment at the [facility] infusion center but because of significant lack of payment they declined to give him the infusions there anymore.  Starting from cycle #18 he received his treatment at the [facility] in [place]. Starting from cycle #35 the treatment was Ultomiris  was changed to every 7 weeks. Assessment and Plan Assessment & Plan Paroxysmal nocturnal hemoglobinuria Diagnosed in June 2016, currently on treatment with Ultomiris  every eight weeks. Reports feeling rough a week and a half before the next cycle, with dark hearing and decreased appearance. Hemoglobin is 13.2, platelets  are 119, and bilirubin levels are pending. Considering adjusting Ultomiris  frequency to every seven weeks to manage symptoms better. Discussed potential use of oral medication but prefers to maintain current treatment options. Advised against high-dose steroids due to potential side effects. Discussed the possibility of increasing steroid dose if bilirubin levels are elevated. - Changed Ultomiris  frequency to every seven weeks. - Monitor bilirubin levels; if elevated, will consider increasing steroid dose. - Will schedule follow-up labs in December to assess treatment efficacy. - We will also continue on treatment with prednisone  5 mg daily.  The patient voices understanding of current disease status and treatment options and is in agreement with the current care plan. All questions were answered. The patient knows to call the clinic with any problems, questions or concerns. We can certainly see the patient much sooner if necessary.  Disclaimer: This note was dictated with voice recognition software. Similar sounding words can inadvertently be transcribed and may not be corrected upon review.

## 2024-06-14 ENCOUNTER — Encounter: Payer: Self-pay | Admitting: Physician Assistant

## 2024-06-26 ENCOUNTER — Other Ambulatory Visit: Payer: Self-pay | Admitting: Medical Oncology

## 2024-06-26 DIAGNOSIS — D595 Paroxysmal nocturnal hemoglobinuria [Marchiafava-Micheli]: Secondary | ICD-10-CM

## 2024-06-27 ENCOUNTER — Inpatient Hospital Stay: Attending: Internal Medicine | Admitting: Internal Medicine

## 2024-06-27 ENCOUNTER — Inpatient Hospital Stay

## 2024-06-27 VITALS — BP 132/78 | HR 92 | Temp 97.8°F | Resp 17 | Ht 69.0 in | Wt 174.4 lb

## 2024-06-27 VITALS — BP 130/74 | HR 74 | Temp 98.0°F | Resp 16

## 2024-06-27 DIAGNOSIS — K760 Fatty (change of) liver, not elsewhere classified: Secondary | ICD-10-CM | POA: Insufficient documentation

## 2024-06-27 DIAGNOSIS — D595 Paroxysmal nocturnal hemoglobinuria [Marchiafava-Micheli]: Secondary | ICD-10-CM | POA: Diagnosis not present

## 2024-06-27 LAB — CBC WITH DIFFERENTIAL (CANCER CENTER ONLY)
Abs Immature Granulocytes: 0.01 K/uL (ref 0.00–0.07)
Basophils Absolute: 0 K/uL (ref 0.0–0.1)
Basophils Relative: 1 %
Eosinophils Absolute: 0 K/uL (ref 0.0–0.5)
Eosinophils Relative: 1 %
HCT: 38 % — ABNORMAL LOW (ref 39.0–52.0)
Hemoglobin: 14 g/dL (ref 13.0–17.0)
Immature Granulocytes: 0 %
Lymphocytes Relative: 24 %
Lymphs Abs: 1.3 K/uL (ref 0.7–4.0)
MCH: 39.4 pg — ABNORMAL HIGH (ref 26.0–34.0)
MCHC: 36.8 g/dL — ABNORMAL HIGH (ref 30.0–36.0)
MCV: 107 fL — ABNORMAL HIGH (ref 80.0–100.0)
Monocytes Absolute: 0.5 K/uL (ref 0.1–1.0)
Monocytes Relative: 8 %
Neutro Abs: 3.8 K/uL (ref 1.7–7.7)
Neutrophils Relative %: 66 %
Platelet Count: 112 K/uL — ABNORMAL LOW (ref 150–400)
RBC: 3.55 MIL/uL — ABNORMAL LOW (ref 4.22–5.81)
RDW: 12.8 % (ref 11.5–15.5)
WBC Count: 5.7 K/uL (ref 4.0–10.5)
nRBC: 0 % (ref 0.0–0.2)

## 2024-06-27 LAB — CMP (CANCER CENTER ONLY)
ALT: 207 U/L — ABNORMAL HIGH (ref 0–44)
AST: 102 U/L — ABNORMAL HIGH (ref 15–41)
Albumin: 4.6 g/dL (ref 3.5–5.0)
Alkaline Phosphatase: 55 U/L (ref 38–126)
Anion gap: 13 (ref 5–15)
BUN: 16 mg/dL (ref 6–20)
CO2: 23 mmol/L (ref 22–32)
Calcium: 9.4 mg/dL (ref 8.9–10.3)
Chloride: 99 mmol/L (ref 98–111)
Creatinine: 1.02 mg/dL (ref 0.61–1.24)
GFR, Estimated: >60 mL/min
Glucose, Bld: 131 mg/dL — ABNORMAL HIGH (ref 70–99)
Potassium: 4.2 mmol/L (ref 3.5–5.1)
Sodium: 135 mmol/L (ref 135–145)
Total Bilirubin: 4.1 mg/dL (ref 0.0–1.2)
Total Protein: 6.9 g/dL (ref 6.5–8.1)

## 2024-06-27 LAB — LACTATE DEHYDROGENASE: LDH: 203 U/L (ref 105–235)

## 2024-06-27 MED ORDER — SODIUM CHLORIDE 0.9 % IV SOLN: Freq: Once | INTRAVENOUS | Status: AC

## 2024-06-27 MED ORDER — SODIUM CHLORIDE 0.9 % IV SOLN
3300.0000 mg | Freq: Once | INTRAVENOUS | Status: AC
  Administered 2024-06-27: 3300 mg via INTRAVENOUS
  Filled 2024-06-27: qty 33

## 2024-06-27 NOTE — Progress Notes (Signed)
 CRITICAL VALUE STICKER  CRITICAL VALUE: Total Bilirubin 4.1  RECEIVER (on-site recipient of call): Rosina   DATE & TIME NOTIFIED: 06/27/24 @ 820  MESSENGER (representative from lab): Harlene   MD NOTIFIED: Dr. Sherrod   TIME OF NOTIFICATION: [phone]  RESPONSE: patient has appt with Dr. Sherrod today

## 2024-06-27 NOTE — Patient Instructions (Signed)
 [facility] - A DEPT OF [facility]  Discharge Instructions: Thank you for choosing [facility] to provide your oncology and hematology care.   If you have a lab appointment with the Cancer Center, please go directly to the Cancer Center and check in at the registration area.   Wear comfortable clothing and clothing appropriate for easy access to any Portacath or PICC line.   We strive to give you quality time with your provider. You may need to reschedule your appointment if you arrive late (15 or more minutes).  Arriving late affects you and other patients whose appointments are after yours.  Also, if you miss three or more appointments without notifying the office, you may be dismissed from the clinic at the provider's discretion.      For prescription refill requests, have your pharmacy contact our office and allow 72 hours for refills to be completed.    Today you received the following chemotherapy and/or immunotherapy agents: Ultomiris      To help prevent nausea and vomiting after your treatment, we encourage you to take your nausea medication as directed.  BELOW ARE SYMPTOMS THAT SHOULD BE REPORTED IMMEDIATELY: *FEVER GREATER THAN 100.4 F (38 C) OR HIGHER *CHILLS OR SWEATING *NAUSEA AND VOMITING THAT IS NOT CONTROLLED WITH YOUR NAUSEA MEDICATION *UNUSUAL SHORTNESS OF BREATH *UNUSUAL BRUISING OR BLEEDING *URINARY PROBLEMS (pain or burning when urinating, or frequent urination) *BOWEL PROBLEMS (unusual diarrhea, constipation, pain near the anus) TENDERNESS IN MOUTH AND THROAT WITH OR WITHOUT PRESENCE OF ULCERS (sore throat, sores in mouth, or a toothache) UNUSUAL RASH, SWELLING OR PAIN  UNUSUAL VAGINAL DISCHARGE OR ITCHING   Items with * indicate a potential emergency and should be followed up as soon as possible or go to the Emergency Department if any problems should occur.  Please show the CHEMOTHERAPY ALERT CARD or IMMUNOTHERAPY  ALERT CARD at check-in to the Emergency Department and triage nurse.  Should you have questions after your visit or need to cancel or reschedule your appointment, please contact [facility] - A DEPT OF [facility]  Dept: [phone]  and follow the prompts.  Office hours are 8:00 a.m. to 4:30 p.m. Monday - Friday. Please note that voicemails left after 4:00 p.m. may not be returned until the following business day.  We are closed weekends and major holidays. You have access to a nurse at all times for urgent questions. Please call the main number to the clinic Dept: [phone] and follow the prompts.   For any non-urgent questions, you may also contact your provider using MyChart. We now offer e-Visits for anyone 72 and older to request care online for non-urgent symptoms. For details visit [email or web address].   Also download the MyChart app! Go to the app store, search "MyChart", open the app, select [place], and log in with your MyChart username and password.

## 2024-06-27 NOTE — Progress Notes (Signed)
 "     [facility] Telephone:(336) [phone]   Fax:(336) [phone]  OFFICE PROGRESS NOTE  Travis Emery CROME, MD [address] 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  PRIOR THERAPY:  Soliris  (Eculizumab ) initially with induction 600 MG IV weekly for 4 weeks and then switched to maintenance treatment 900 MG IV every 2 weeks. First dose of treatment was given on 11/21/2014. Status post 45 cycles.  After cycle #45 the patient received his treatment at outside infusion center.  Status post 16 more cycles of treatment.  This was discontinued secondary to worsening symptoms and lack of control of his disease.  CURRENT THERAPY: Ultomiris  (Ravulizumab ) loading dose 2700 mg/M2 followed by maintenance dose 3300 mg/M2 every 8 weeks.  First dose of treatment September 22, 2018.  He status post the induction dose of the treatment as well as 34 cycles of maintenance therapy.  The first 3 cycles of his treatment were given at the infusion center in [place] .  Starting from cycle #18 the patient will receive his treatment at the [facility].  Starting from cycle #35 Ultomiris  was getting every 7 weeks.  INTERVAL HISTORY: Travis Mcdaniel 53 y.o. male returns to the clinic today for follow-up visit. Discussed the use of AI scribe software for clinical note transcription with the patient, who gave verbal consent to proceed.  History of Present Illness Travis Mcdaniel is a 53 year old male with paroxysmal nocturnal hemoglobinuria who presents for pre-infusion evaluation and ongoing management of PNH.  He was diagnosed with PNH in June 2016 and initially treated with Solaris, but transitioned to Ultomiris  in March 2020 due to worsening symptoms and inadequate disease control. Ultomiris  was administered every four weeks until cycle 35, after which the interval was extended to  every seven weeks.  He reports overall clinical stability but experienced a recent episode of jaundice last week, with yellow discoloration of the periorbital skin and sclera. During this episode, he increased his prednisone  from 5 mg to 10 mg daily for several days, which resolved the jaundice. He currently takes prednisone  5 mg daily and folic acid , and denies pain or other acute symptoms.  Laboratory studies show stable hemoglobin at 14 g/dL, platelets at 887 k89^0/O, and LDH at 203 U/L. Total bilirubin is mildly elevated at 4.1 mg/dL (previously 3.9). Liver enzymes remain elevated, with AST at 102 U/L (down from 151) and ALT at 207 U/L (down from 209). He expresses concern regarding persistently elevated liver enzymes despite significant reduction in alcohol intake. He denies abdominal pain and other acute gastrointestinal symptoms.  He has a history of heavy alcohol use, now substantially reduced, previously attributed to depression and financial stress. He is scheduled for GI evaluation for further assessment of liver disease. He has not required recent transfusions and has not experienced hemolytic crises since the last visit.     MEDICAL HISTORY: Past Medical History:  Diagnosis Date   Cancer (HCC)    PNH (Bone marrow)   Diverticulosis    Encounter for antineoplastic chemotherapy 01/15/2016   Hypertension     ALLERGIES:  is allergic to nsaids.  MEDICATIONS:  Current Outpatient Medications  Medication Sig Dispense Refill   ALPRAZolam  (XANAX ) 1 MG tablet Take 1 mg by mouth 3 (three) times daily as needed for anxiety.  2   aspirin  EC 81 MG tablet Take 81 mg by mouth in the morning. Swallow whole.  Cholecalciferol (VITAMIN D3) 125 MCG (5000 UT) CHEW Chew 5,000 Units by mouth daily.     ELIQUIS  2.5 MG TABS tablet TAKE 1 TABLET BY MOUTH TWICE A DAY (Patient not taking: Reported on 01/21/2024) 60 tablet 2   ferrous sulfate  325 (65 FE) MG EC tablet Take 325 mg by mouth every other day.      folic acid  (FOLVITE ) 800 MCG tablet Take 800 mcg by mouth daily.     lisinopril -hydrochlorothiazide  (PRINZIDE ,ZESTORETIC ) 20-25 MG tablet Take 1 tablet by mouth daily.     Multiple Vitamins-Minerals (MULTIVITAMIN ADULT PO) Take 1 tablet by mouth daily with breakfast.     oxyCODONE  (OXY IR/ROXICODONE ) 5 MG immediate release tablet Take 5 mg by mouth daily as needed (for pain).     Potassium 99 MG TABS Take 99 mg by mouth daily.     predniSONE  (DELTASONE ) 5 MG tablet Take 1 tablet (5 mg total) by mouth daily with breakfast. 60 tablet 0   Psyllium (METAMUCIL PO) Take 4 g by mouth See admin instructions. Mix 4 grams (1 teaspoonful) into a glass of water and drink by mouth once a day     tetrahydrozoline 0.05 % ophthalmic solution Place 1 drop into both eyes 2 (two) times daily as needed (eye irritation).     No current facility-administered medications for this visit.    SURGICAL HISTORY: No past surgical history on file.  REVIEW OF SYSTEMS:  Constitutional: positive for fatigue Eyes: positive for icterus Ears, nose, mouth, throat, and face: negative Respiratory: negative Cardiovascular: negative Gastrointestinal: negative Genitourinary:negative Integument/breast: negative Hematologic/lymphatic: negative Musculoskeletal:negative Neurological: negative Behavioral/Psych: negative Endocrine: negative Allergic/Immunologic: negative   PHYSICAL EXAMINATION: General appearance: alert, cooperative, fatigued, and no distress Head: Normocephalic, without obvious abnormality, atraumatic Neck: no adenopathy, no JVD, supple, symmetrical, trachea midline, and thyroid not enlarged, symmetric, no tenderness/mass/nodules Lymph nodes: Cervical, supraclavicular, and axillary nodes normal. Resp: clear to auscultation bilaterally Back: symmetric, no curvature. ROM normal. No CVA tenderness. Cardio: regular rate and rhythm, S1, S2 normal, no murmur, click, rub or gallop GI: soft, non-tender; bowel  sounds normal; no masses,  no organomegaly Extremities: extremities normal, atraumatic, no cyanosis or edema Neurologic: Alert and oriented X 3, normal strength and tone. Normal symmetric reflexes. Normal coordination and gait  ECOG PERFORMANCE STATUS: 1 - Symptomatic but completely ambulatory  Blood pressure 132/78, pulse 92, temperature 97.8 F (36.6 C), temperature source Temporal, resp. rate 17, height 5' 9 (1.753 m), weight 174 lb 6.4 oz (79.1 kg), SpO2 98%.  LABORATORY DATA: Lab Results  Component Value Date   WBC 5.7 06/27/2024   HGB 14.0 06/27/2024   HCT 38.0 (L) 06/27/2024   MCV 107.0 (H) 06/27/2024   PLT 112 (L) 06/27/2024      Chemistry      Component Value Date/Time   NA 132 (L) 05/10/2024 0726   NA 136 06/28/2017 1236   K 3.6 05/10/2024 0726   K 4.1 06/28/2017 1236   CL 97 (L) 05/10/2024 0726   CO2 25 05/10/2024 0726   CO2 24 06/28/2017 1236   BUN 12 05/10/2024 0726   BUN 17.1 06/28/2017 1236   CREATININE 0.89 05/10/2024 0726   CREATININE 1.0 06/28/2017 1236      Component Value Date/Time   CALCIUM 9.5 05/10/2024 0726   CALCIUM 9.1 06/28/2017 1236   ALKPHOS 48 05/10/2024 0726   ALKPHOS 66 06/28/2017 1236   AST 151 (H) 05/10/2024 0726   AST 40 (H) 06/28/2017 1236   ALT 209 (H) 05/10/2024  0726   ALT 60 (H) 06/28/2017 1236   BILITOT 3.9 (HH) 05/10/2024 0726   BILITOT 2.87 (H) 06/28/2017 1236       RADIOGRAPHIC STUDIES: No results found.  ASSESSMENT AND PLAN:  This is a very pleasant 52 years old white male with paroxysmal nocturnal hemoglobinuria and currently on treatment with Soliris  on days 1 and 15 every 4 weeks status post 45 cycles.  He is also on the same treatment received at outside infusion center status post 19 more cycles. He also completed a course of tapered dose of prednisone . He is currently on treatment with Ultomiris  status post induction dose followed by 34 cycles of maintenance therapy.  He was receiving his treatment at the  [facility] infusion center but because of significant lack of payment they declined to give him the infusions there anymore.  Starting from cycle #18 he received his treatment at the [facility] in [place]. Starting from cycle #35 the treatment was Ultomiris  was changed to every 7 weeks. 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 He experiences intermittent symptomatic hyperbilirubinemia responsive to short prednisone  courses. No acute pain or other symptoms. Risks of steroid use, including potential side effects, were discussed. - Continued Ultomiris  every seven weeks. - Assessed hemoglobin, platelets, LDH, and bilirubin during visit. - Recommended prednisone  10 mg daily for one week, then taper to 5 mg as needed for hyperbilirubinemia, with discussion of steroid side effects and rationale for limiting dose and duration. - Agreed to provide prednisone  refills as needed upon request. - Scheduled next follow-up in seven weeks.  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 Imaging demonstrates diffuse hepatic steatosis without cirrhosis. He has significantly reduced alcohol intake. No abdominal pain or acute hepatic symptoms. Emphasized importance of continued abstinence to prevent progression to cirrhosis. GI evaluation is scheduled for further assessment. - Reviewed recent CT scan and liver enzyme trends. - Discussed referral to gastroenterology for further evaluation, including possible abdominal imaging. - Advised continued abstinence from alcohol to prevent progression to cirrhosis. - Reinforced importance of follow-up with gastroenterology on July 20, 2024. The patient was advised to call immediately if he has  any other concerning symptoms in the interval.  The patient voices understanding of current disease status and treatment options and is in agreement with the current care plan. All questions were answered. The patient knows to call the clinic with any problems, questions or concerns. We can certainly see the patient much sooner if necessary.  Disclaimer: This note was dictated with voice recognition software. Similar sounding words can inadvertently be transcribed and may not be corrected upon review.        "

## 2024-07-03 ENCOUNTER — Other Ambulatory Visit: Payer: Self-pay | Admitting: Medical Oncology

## 2024-07-03 DIAGNOSIS — D595 Paroxysmal nocturnal hemoglobinuria [Marchiafava-Micheli]: Secondary | ICD-10-CM

## 2024-07-03 MED ORDER — PREDNISONE 5 MG PO TABS: 5.0000 mg | ORAL_TABLET | Freq: Every day | ORAL | 0 refills | Status: AC

## 2024-07-04 ENCOUNTER — Other Ambulatory Visit (HOSPITAL_COMMUNITY): Payer: Self-pay

## 2024-07-04 ENCOUNTER — Encounter: Payer: Self-pay | Admitting: Internal Medicine

## 2024-07-16 NOTE — Progress Notes (Unsigned)
 "     Travis Console, [name] [address] [place], [place]  [address] Phone: [phone]   Gastroenterology Consultation  Referring Provider:     [name], MD Primary Care Physician:  Travis Emery CROME, MD Primary Gastroenterologist:  Travis Console, [name] / *** Reason for Consultation:     Hyperbilirubinemia        HPI:   Discussed the use of AI scribe software for clinical note transcription with the patient, who gave verbal consent to proceed.  New patient.  Evaluate hyperbilirubinemia.  History of Present Illness      Past Medical History:  Diagnosis Date   Cancer (HCC)    PNH (Bone marrow)   Diverticulosis    Encounter for antineoplastic chemotherapy 01/15/2016   Hypertension     No past surgical history on file.  Prior to Admission medications  Medication Sig Start Date End Date Taking? Authorizing Provider  ALPRAZolam  (XANAX ) 1 MG tablet Take 1 mg by mouth 3 (three) times daily as needed for anxiety. 10/09/15   [provider]  aspirin  EC 81 MG tablet Take 81 mg by mouth in the morning. Swallow whole.    [provider]  Cholecalciferol (VITAMIN D3) 125 MCG (5000 UT) CHEW Chew 5,000 Units by mouth daily.    [provider]  ELIQUIS  2.5 MG TABS tablet TAKE 1 TABLET BY MOUTH TWICE A DAY 07/28/23   Mcdaniel, Travis L, [name]  ferrous sulfate  325 (65 FE) MG EC tablet Take 325 mg by mouth every other day.    [provider]  folic acid  (FOLVITE ) 800 MCG tablet Take 800 mcg by mouth daily.    [provider]  lisinopril -hydrochlorothiazide  (PRINZIDE ,ZESTORETIC ) 20-25 MG tablet Take 1 tablet by mouth daily.    [provider]  Multiple Vitamins-Minerals (MULTIVITAMIN ADULT PO) Take 1 tablet by mouth daily with breakfast.    [provider]  oxyCODONE  (OXY IR/ROXICODONE ) 5 MG immediate release tablet Take 5 mg by mouth daily as needed (for pain).    [provider]  Potassium 99 MG TABS  Take 99 mg by mouth daily.    [provider]  predniSONE  (DELTASONE ) 5 MG tablet Take 1 tablet (5 mg total) by mouth daily with breakfast. 07/03/24   Travis Sherrod, MD  Psyllium (METAMUCIL PO) Take 4 g by mouth See admin instructions. Mix 4 grams (1 teaspoonful) into a glass of water and drink by mouth once a day    [provider]  tetrahydrozoline 0.05 % ophthalmic solution Place 1 drop into both eyes 2 (two) times daily as needed (eye irritation).    [provider]    Family History  Problem Relation Age of Onset   Alcohol abuse Brother    Cirrhosis Brother        deceased     Social History[1]  Allergies as of 07/20/2024 - Review Complete 06/27/2024  Allergen Reaction Noted   Nsaids Other (See Comments) 01/12/2023    Review of Systems:    All systems reviewed and negative except where noted in HPI.   Physical Exam:  There were no vitals taken for this visit. No LMP for male patient.  General:   Alert,  Well-developed, well-nourished, pleasant and cooperative in NAD Lungs:  Respirations even and unlabored.  Clear throughout to auscultation.   No wheezes, crackles, or rhonchi. No acute distress. Heart:  Regular rate and rhythm; no murmurs, clicks, rubs, or gallops. Abdomen:  Normal bowel sounds.  No  bruits.  Soft, and non-distended without masses, hepatosplenomegaly or hernias noted.  No Tenderness.  No guarding or rebound tenderness.    Neurologic:  Alert and oriented x3;  grossly normal neurologically. Psych:  Alert and cooperative. Normal mood and affect.   Imaging Studies: No results found.  Labs: CBC    Component Value Date/Time   WBC 5.7 06/27/2024 0731   WBC 5.9 01/22/2024 0528   RBC 3.55 (Mcdaniel) 06/27/2024 0731   HGB 14.0 06/27/2024 0731   HGB 14.2 06/28/2017 1236   HCT 38.0 (Mcdaniel) 06/27/2024 0731   HCT 41.7 06/28/2017 1236   PLT 112 (Mcdaniel) 06/27/2024 0731   PLT 124 (Mcdaniel) 06/28/2017 1236   MCV 107.0 (H) 06/27/2024 0731   MCV 113.0 (H)  06/28/2017 1236    CMP     Component Value Date/Time   NA 135 06/27/2024 0731   NA 136 06/28/2017 1236   K 4.2 06/27/2024 0731   K 4.1 06/28/2017 1236   CL 99 06/27/2024 0731   CO2 23 06/27/2024 0731   CO2 24 06/28/2017 1236   GLUCOSE 131 (H) 06/27/2024 0731   GLUCOSE 92 06/28/2017 1236   BUN 16 06/27/2024 0731   BUN 17.1 06/28/2017 1236   CREATININE 1.02 06/27/2024 0731   CREATININE 1.0 06/28/2017 1236   CALCIUM 9.4 06/27/2024 0731   CALCIUM 9.1 06/28/2017 1236   PROT 6.9 06/27/2024 0731   PROT 7.3 06/28/2017 1236   ALBUMIN 4.6 06/27/2024 0731   ALBUMIN 4.5 06/28/2017 1236   AST 102 (H) 06/27/2024 0731   AST 40 (H) 06/28/2017 1236   ALT 207 (H) 06/27/2024 0731   ALT 60 (H) 06/28/2017 1236   ALKPHOS 55 06/27/2024 0731   ALKPHOS 66 06/28/2017 1236   BILITOT 4.1 (HH) 06/27/2024 0731   BILITOT 2.87 (H) 06/28/2017 1236   GFRNONAA >60 06/27/2024 0731   GFRAA >60 03/13/2020 0747    Assessment and Plan:   Travis Mcdaniel is a 54 y.o. y/o male has been referred for ***  Assessment and Plan Assessment & Plan       Follow up ***  Travis Console, [name]      [1]  Social History Tobacco Use   Smoking status: Never   Smokeless tobacco: Never  Vaping Use   Vaping status: Never Used  Substance Use Topics   Alcohol use: Yes    Comment: [employer]/ [employer] weekend   Drug use: No   "

## 2024-07-20 ENCOUNTER — Ambulatory Visit: Admitting: Physician Assistant

## 2024-08-11 ENCOUNTER — Ambulatory Visit: Admitting: Physician Assistant

## 2024-08-16 ENCOUNTER — Inpatient Hospital Stay

## 2024-08-16 ENCOUNTER — Inpatient Hospital Stay: Admitting: Internal Medicine
